# Patient Record
Sex: Male | Born: 1940 | Race: White | Hispanic: No | Marital: Married | Smoking: Former smoker
Health system: Southern US, Community
[De-identification: ages and names within clinical notes are randomized; demographics above are authoritative.]

## PROBLEM LIST (undated history)

## (undated) DIAGNOSIS — N529 Male erectile dysfunction, unspecified: Secondary | ICD-10-CM

## (undated) DIAGNOSIS — K2270 Barrett's esophagus without dysplasia: Secondary | ICD-10-CM

## (undated) DIAGNOSIS — N189 Chronic kidney disease, unspecified: Secondary | ICD-10-CM

## (undated) DIAGNOSIS — D649 Anemia, unspecified: Secondary | ICD-10-CM

## (undated) DIAGNOSIS — I2510 Atherosclerotic heart disease of native coronary artery without angina pectoris: Secondary | ICD-10-CM

## (undated) DIAGNOSIS — I10 Essential (primary) hypertension: Secondary | ICD-10-CM

## (undated) DIAGNOSIS — E785 Hyperlipidemia, unspecified: Secondary | ICD-10-CM

## (undated) DIAGNOSIS — C801 Malignant (primary) neoplasm, unspecified: Secondary | ICD-10-CM

## (undated) DIAGNOSIS — Z5189 Encounter for other specified aftercare: Secondary | ICD-10-CM

## (undated) DIAGNOSIS — Z860101 Personal history of adenomatous and serrated colon polyps: Secondary | ICD-10-CM

## (undated) DIAGNOSIS — K219 Gastro-esophageal reflux disease without esophagitis: Secondary | ICD-10-CM

## (undated) DIAGNOSIS — K5790 Diverticulosis of intestine, part unspecified, without perforation or abscess without bleeding: Secondary | ICD-10-CM

## (undated) DIAGNOSIS — M1990 Unspecified osteoarthritis, unspecified site: Secondary | ICD-10-CM

## (undated) DIAGNOSIS — I219 Acute myocardial infarction, unspecified: Secondary | ICD-10-CM

## (undated) HISTORY — DX: Diverticulosis of intestine, part unspecified, without perforation or abscess without bleeding: K57.90

## (undated) HISTORY — PX: COLONOSCOPY: SHX174

## (undated) HISTORY — PX: OTHER SURGICAL HISTORY: SHX169

## (undated) HISTORY — DX: Personal history of adenomatous and serrated colon polyps: Z86.0101

## (undated) HISTORY — DX: Atherosclerotic heart disease of native coronary artery without angina pectoris: I25.10

## (undated) HISTORY — DX: Chronic kidney disease, unspecified: N18.9

## (undated) HISTORY — PX: UPPER GASTROINTESTINAL ENDOSCOPY: SHX188

## (undated) HISTORY — DX: Essential (primary) hypertension: I10

## (undated) HISTORY — DX: Anemia, unspecified: D64.9

## (undated) HISTORY — DX: Unspecified osteoarthritis, unspecified site: M19.90

## (undated) HISTORY — DX: Malignant (primary) neoplasm, unspecified: C80.1

## (undated) HISTORY — DX: Male erectile dysfunction, unspecified: N52.9

## (undated) HISTORY — DX: Acute myocardial infarction, unspecified: I21.9

## (undated) HISTORY — DX: Gastro-esophageal reflux disease without esophagitis: K21.9

## (undated) HISTORY — DX: Barrett's esophagus without dysplasia: K22.70

## (undated) HISTORY — PX: CARDIAC CATHETERIZATION: SHX172

## (undated) HISTORY — DX: Encounter for other specified aftercare: Z51.89

## (undated) HISTORY — DX: Hyperlipidemia, unspecified: E78.5

## (undated) HISTORY — PX: SPINAL FUSION: SHX223

## (undated) HISTORY — PX: SPINE SURGERY: SHX786

## (undated) HISTORY — PX: ELBOW ARTHROSCOPY: SUR87

---

## 2000-11-07 ENCOUNTER — Inpatient Hospital Stay (HOSPITAL_COMMUNITY): Admission: EM | Admit: 2000-11-07 | Discharge: 2000-11-10 | Payer: Self-pay | Admitting: Emergency Medicine

## 2000-11-07 ENCOUNTER — Encounter: Payer: Self-pay | Admitting: Internal Medicine

## 2000-11-17 ENCOUNTER — Encounter (HOSPITAL_COMMUNITY): Admission: RE | Admit: 2000-11-17 | Discharge: 2001-02-15 | Payer: Self-pay | Admitting: Cardiology

## 2001-02-16 ENCOUNTER — Encounter (HOSPITAL_COMMUNITY): Admission: RE | Admit: 2001-02-16 | Discharge: 2001-03-16 | Payer: Self-pay | Admitting: Cardiology

## 2002-12-27 DIAGNOSIS — C4492 Squamous cell carcinoma of skin, unspecified: Secondary | ICD-10-CM

## 2002-12-27 HISTORY — DX: Squamous cell carcinoma of skin, unspecified: C44.92

## 2003-07-28 ENCOUNTER — Encounter: Payer: Self-pay | Admitting: Gastroenterology

## 2004-06-25 ENCOUNTER — Ambulatory Visit: Payer: Self-pay | Admitting: Family Medicine

## 2004-07-19 ENCOUNTER — Ambulatory Visit (HOSPITAL_COMMUNITY): Admission: RE | Admit: 2004-07-19 | Discharge: 2004-07-20 | Payer: Self-pay | Admitting: Neurological Surgery

## 2005-03-10 ENCOUNTER — Ambulatory Visit: Payer: Self-pay | Admitting: Family Medicine

## 2005-03-14 ENCOUNTER — Encounter: Admission: RE | Admit: 2005-03-14 | Discharge: 2005-03-14 | Payer: Self-pay | Admitting: Family Medicine

## 2005-05-06 ENCOUNTER — Ambulatory Visit: Payer: Self-pay | Admitting: Cardiology

## 2005-06-13 ENCOUNTER — Ambulatory Visit: Payer: Self-pay | Admitting: Family Medicine

## 2005-06-16 ENCOUNTER — Ambulatory Visit: Payer: Self-pay | Admitting: Family Medicine

## 2005-06-26 ENCOUNTER — Ambulatory Visit: Payer: Self-pay | Admitting: Family Medicine

## 2005-07-07 ENCOUNTER — Ambulatory Visit: Payer: Self-pay | Admitting: Family Medicine

## 2005-07-09 ENCOUNTER — Ambulatory Visit: Payer: Self-pay | Admitting: Cardiology

## 2005-07-11 ENCOUNTER — Ambulatory Visit: Payer: Self-pay | Admitting: Cardiology

## 2005-07-11 ENCOUNTER — Ambulatory Visit (HOSPITAL_COMMUNITY): Admission: RE | Admit: 2005-07-11 | Discharge: 2005-07-12 | Payer: Self-pay | Admitting: Internal Medicine

## 2005-07-21 ENCOUNTER — Ambulatory Visit: Payer: Self-pay

## 2005-07-28 ENCOUNTER — Ambulatory Visit: Payer: Self-pay | Admitting: Cardiology

## 2005-08-29 ENCOUNTER — Ambulatory Visit: Payer: Self-pay | Admitting: Family Medicine

## 2005-10-28 ENCOUNTER — Ambulatory Visit: Payer: Self-pay | Admitting: Cardiology

## 2006-04-30 ENCOUNTER — Ambulatory Visit: Payer: Self-pay | Admitting: Cardiology

## 2006-05-08 ENCOUNTER — Ambulatory Visit: Payer: Self-pay | Admitting: Internal Medicine

## 2006-05-08 ENCOUNTER — Ambulatory Visit: Payer: Self-pay | Admitting: Cardiology

## 2006-05-08 ENCOUNTER — Inpatient Hospital Stay (HOSPITAL_COMMUNITY): Admission: AD | Admit: 2006-05-08 | Discharge: 2006-05-11 | Payer: Self-pay | Admitting: Internal Medicine

## 2006-05-10 ENCOUNTER — Encounter: Payer: Self-pay | Admitting: Gastroenterology

## 2006-05-10 ENCOUNTER — Encounter (INDEPENDENT_AMBULATORY_CARE_PROVIDER_SITE_OTHER): Payer: Self-pay | Admitting: *Deleted

## 2006-05-11 ENCOUNTER — Encounter (INDEPENDENT_AMBULATORY_CARE_PROVIDER_SITE_OTHER): Payer: Self-pay | Admitting: *Deleted

## 2006-05-13 ENCOUNTER — Ambulatory Visit: Payer: Self-pay | Admitting: Internal Medicine

## 2006-05-14 ENCOUNTER — Ambulatory Visit: Payer: Self-pay | Admitting: Gastroenterology

## 2006-05-21 ENCOUNTER — Ambulatory Visit: Payer: Self-pay | Admitting: Family Medicine

## 2006-06-04 ENCOUNTER — Ambulatory Visit: Payer: Self-pay | Admitting: Gastroenterology

## 2006-08-28 ENCOUNTER — Ambulatory Visit: Payer: Self-pay | Admitting: Family Medicine

## 2006-08-28 LAB — CONVERTED CEMR LAB
ALT: 27 units/L (ref 0–40)
AST: 29 units/L (ref 0–37)
Albumin: 3.7 g/dL (ref 3.5–5.2)
BUN: 18 mg/dL (ref 6–23)
Basophils Relative: 0.4 % (ref 0.0–1.0)
Eosinophil percent: 2 % (ref 0.0–5.0)
GFR calc non Af Amer: 59 mL/min
HCT: 44.3 % (ref 39.0–52.0)
Hemoglobin: 15 g/dL (ref 13.0–17.0)
Neutro Abs: 4.1 10*3/uL (ref 1.4–7.7)
Neutrophils Relative %: 54.3 % (ref 43.0–77.0)
PSA: 0.82 ng/mL (ref 0.10–4.00)
Potassium: 4.2 meq/L (ref 3.5–5.1)
RBC: 5.06 M/uL (ref 4.22–5.81)
RDW: 18 % — ABNORMAL HIGH (ref 11.5–14.6)
Sodium: 139 meq/L (ref 135–145)
Total Bilirubin: 0.8 mg/dL (ref 0.3–1.2)
Total Protein: 7.2 g/dL (ref 6.0–8.3)
Triglyceride fasting, serum: 161 mg/dL — ABNORMAL HIGH (ref 0–149)
VLDL: 32 mg/dL (ref 0–40)
WBC: 7.7 10*3/uL (ref 4.5–10.5)

## 2006-09-07 ENCOUNTER — Ambulatory Visit: Payer: Self-pay | Admitting: Family Medicine

## 2006-10-22 ENCOUNTER — Ambulatory Visit: Payer: Self-pay | Admitting: Gastroenterology

## 2006-10-26 ENCOUNTER — Ambulatory Visit: Payer: Self-pay | Admitting: Cardiology

## 2006-11-25 ENCOUNTER — Encounter (INDEPENDENT_AMBULATORY_CARE_PROVIDER_SITE_OTHER): Payer: Self-pay | Admitting: *Deleted

## 2006-11-25 ENCOUNTER — Ambulatory Visit: Payer: Self-pay | Admitting: Gastroenterology

## 2007-01-07 ENCOUNTER — Encounter: Admission: RE | Admit: 2007-01-07 | Discharge: 2007-01-07 | Payer: Self-pay | Admitting: Family Medicine

## 2007-03-09 DIAGNOSIS — Z8719 Personal history of other diseases of the digestive system: Secondary | ICD-10-CM

## 2007-03-09 DIAGNOSIS — F528 Other sexual dysfunction not due to a substance or known physiological condition: Secondary | ICD-10-CM | POA: Insufficient documentation

## 2007-03-09 DIAGNOSIS — I10 Essential (primary) hypertension: Secondary | ICD-10-CM | POA: Insufficient documentation

## 2007-05-13 ENCOUNTER — Ambulatory Visit: Payer: Self-pay | Admitting: Cardiology

## 2007-05-13 LAB — CONVERTED CEMR LAB
AST: 25 units/L (ref 0–37)
Albumin: 3.9 g/dL (ref 3.5–5.2)
Cholesterol: 138 mg/dL (ref 0–200)
HDL: 38.2 mg/dL — ABNORMAL LOW (ref >39.0)
Total Bilirubin: 0.8 mg/dL (ref 0.3–1.2)
Total Protein: 7.3 g/dL (ref 6.0–8.3)
Triglycerides: 130 mg/dL (ref 0–149)

## 2007-07-09 IMAGING — CR DG CHEST 2V
2 series · 2 of 2 positions shown · non-contrast
Comparison: 07/19/04.

CLINICAL DATA: Hypertension.  CAD. 
 CHEST ? 2 VIEW:

[view not recorded (1 of 2)]
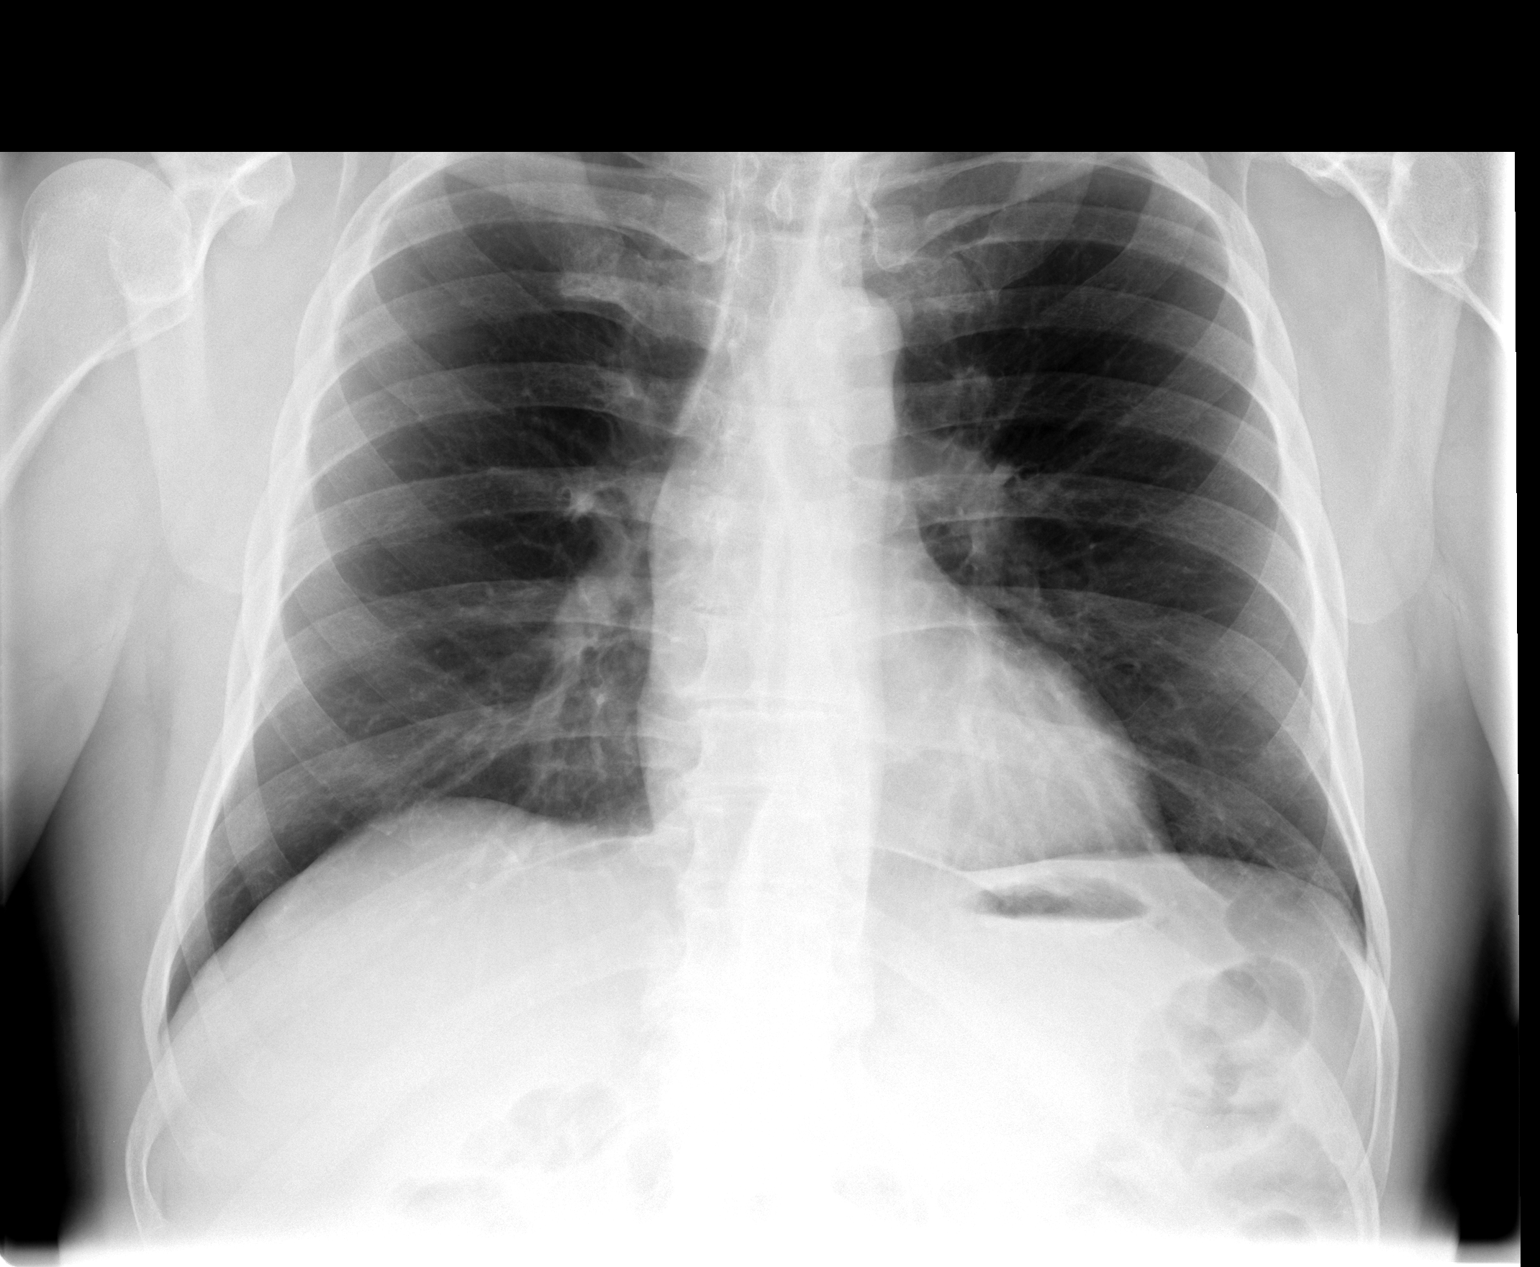

[view not recorded (2 of 2)]
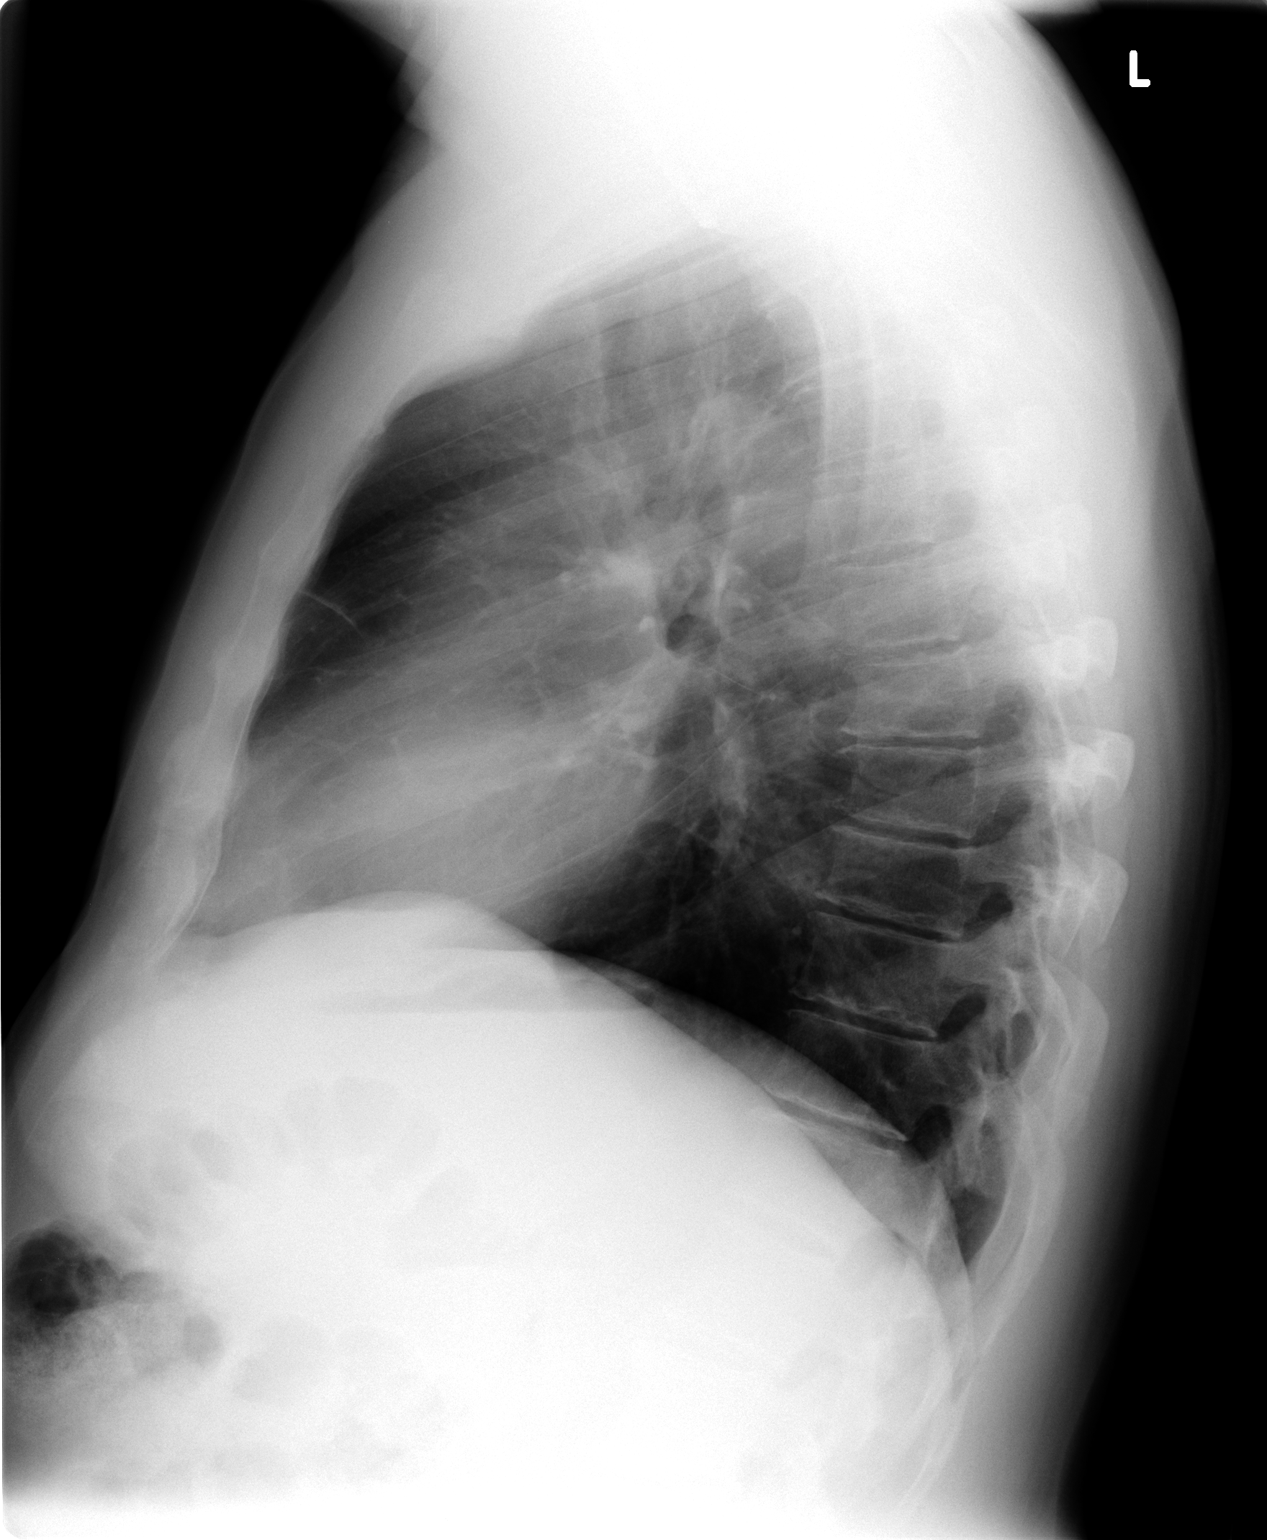

[2 of 2 positions shown; findings below may reference images not displayed]

FINDINGS: The heart size and mediastinal contours are within normal limits.  Both lungs are clear.  The visualized skeletal structures are unremarkable.
IMPRESSION: No active cardiopulmonary disease.

## 2007-09-21 ENCOUNTER — Ambulatory Visit: Payer: Self-pay | Admitting: Family Medicine

## 2007-09-21 DIAGNOSIS — I259 Chronic ischemic heart disease, unspecified: Secondary | ICD-10-CM

## 2007-09-21 DIAGNOSIS — Z87448 Personal history of other diseases of urinary system: Secondary | ICD-10-CM | POA: Insufficient documentation

## 2007-09-21 DIAGNOSIS — E7800 Pure hypercholesterolemia, unspecified: Secondary | ICD-10-CM | POA: Insufficient documentation

## 2007-09-21 DIAGNOSIS — E785 Hyperlipidemia, unspecified: Secondary | ICD-10-CM

## 2007-09-21 LAB — CONVERTED CEMR LAB
Basophils Relative: 0.4 % (ref 0.0–1.0)
Bilirubin, Direct: 0.2 mg/dL (ref 0.0–0.3)
CO2: 32 meq/L (ref 19–32)
Cholesterol: 166 mg/dL (ref 0–200)
Creatinine, Ser: 1.2 mg/dL (ref 0.4–1.5)
HCT: 44.6 % (ref 39.0–52.0)
Hemoglobin: 14.7 g/dL (ref 13.0–17.0)
LDL Cholesterol: 102 mg/dL — ABNORMAL HIGH (ref 0–99)
Lymphocytes Relative: 38.4 % (ref 12.0–46.0)
MCHC: 33 g/dL (ref 30.0–36.0)
Monocytes Absolute: 1.2 10*3/uL — ABNORMAL HIGH (ref 0.2–0.7)
Neutrophils Relative %: 42.9 % — ABNORMAL LOW (ref 43.0–77.0)
PSA: 1.1 ng/mL (ref 0.10–4.00)
Potassium: 4.9 meq/L (ref 3.5–5.1)
RDW: 12.1 % (ref 11.5–14.6)
Sodium: 139 meq/L (ref 135–145)
TSH: 0.52 microintl units/mL (ref 0.35–5.50)
Total Bilirubin: 0.8 mg/dL (ref 0.3–1.2)
Total Protein: 7.1 g/dL (ref 6.0–8.3)
VLDL: 22 mg/dL (ref 0–40)

## 2007-10-06 ENCOUNTER — Telehealth: Payer: Self-pay | Admitting: Family Medicine

## 2007-11-18 ENCOUNTER — Ambulatory Visit: Payer: Self-pay | Admitting: Gastroenterology

## 2007-12-08 ENCOUNTER — Ambulatory Visit: Payer: Self-pay | Admitting: Gastroenterology

## 2007-12-08 ENCOUNTER — Encounter: Payer: Self-pay | Admitting: Gastroenterology

## 2007-12-20 ENCOUNTER — Encounter: Payer: Self-pay | Admitting: Gastroenterology

## 2008-04-10 ENCOUNTER — Ambulatory Visit: Payer: Self-pay | Admitting: Family Medicine

## 2008-04-10 DIAGNOSIS — R300 Dysuria: Secondary | ICD-10-CM | POA: Insufficient documentation

## 2008-04-10 LAB — CONVERTED CEMR LAB
Bilirubin Urine: NEGATIVE
Urobilinogen, UA: 0.2
pH: 6

## 2008-06-08 ENCOUNTER — Ambulatory Visit: Payer: Self-pay | Admitting: Family Medicine

## 2008-09-15 ENCOUNTER — Ambulatory Visit: Payer: Self-pay | Admitting: Cardiology

## 2008-09-22 ENCOUNTER — Ambulatory Visit: Payer: Self-pay | Admitting: Family Medicine

## 2008-09-22 LAB — CONVERTED CEMR LAB
Ketones, urine, test strip: NEGATIVE
Nitrite: NEGATIVE
Urobilinogen, UA: 0.2

## 2008-09-25 LAB — CONVERTED CEMR LAB
ALT: 24 units/L (ref 0–53)
AST: 30 units/L (ref 0–37)
Alkaline Phosphatase: 59 units/L (ref 39–117)
Bilirubin, Direct: 0.1 mg/dL (ref 0.0–0.3)
CO2: 32 meq/L (ref 19–32)
Chloride: 102 meq/L (ref 96–112)
Glucose, Bld: 94 mg/dL (ref 70–99)
Hemoglobin: 14.4 g/dL (ref 13.0–17.0)
LDL Cholesterol: 80 mg/dL (ref 0–99)
Lymphocytes Relative: 32 % (ref 12.0–46.0)
Monocytes Relative: 9.7 % (ref 3.0–12.0)
Neutro Abs: 4.3 10*3/uL (ref 1.4–7.7)
Potassium: 4 meq/L (ref 3.5–5.1)
RDW: 12.7 % (ref 11.5–14.6)
Sodium: 143 meq/L (ref 135–145)
Total CHOL/HDL Ratio: 3.4
Total Protein: 7.2 g/dL (ref 6.0–8.3)
Triglycerides: 129 mg/dL (ref 0–149)

## 2008-10-20 ENCOUNTER — Telehealth: Payer: Self-pay | Admitting: Family Medicine

## 2008-10-20 ENCOUNTER — Ambulatory Visit: Payer: Self-pay | Admitting: Family Medicine

## 2008-10-27 ENCOUNTER — Telehealth: Payer: Self-pay | Admitting: Family Medicine

## 2009-06-21 ENCOUNTER — Encounter (INDEPENDENT_AMBULATORY_CARE_PROVIDER_SITE_OTHER): Payer: Self-pay | Admitting: *Deleted

## 2009-07-04 ENCOUNTER — Ambulatory Visit: Payer: Self-pay | Admitting: Family Medicine

## 2009-10-01 ENCOUNTER — Ambulatory Visit: Payer: Self-pay | Admitting: Family Medicine

## 2009-10-01 DIAGNOSIS — M1710 Unilateral primary osteoarthritis, unspecified knee: Secondary | ICD-10-CM

## 2009-10-01 DIAGNOSIS — IMO0002 Reserved for concepts with insufficient information to code with codable children: Secondary | ICD-10-CM | POA: Insufficient documentation

## 2009-10-01 LAB — CONVERTED CEMR LAB
AST: 24 units/L (ref 0–37)
Albumin: 3.9 g/dL (ref 3.5–5.2)
Alkaline Phosphatase: 58 units/L (ref 39–117)
Basophils Absolute: 0 10*3/uL (ref 0.0–0.1)
Bilirubin, Direct: 0.1 mg/dL (ref 0.0–0.3)
Calcium: 9.2 mg/dL (ref 8.4–10.5)
Creatinine, Ser: 1.2 mg/dL (ref 0.4–1.5)
Eosinophils Absolute: 0.1 10*3/uL (ref 0.0–0.7)
GFR calc non Af Amer: 63.42 mL/min (ref >60)
Glucose, Bld: 94 mg/dL (ref 70–99)
HDL: 51 mg/dL (ref >39.00)
Hemoglobin: 13.9 g/dL (ref 13.0–17.0)
Lymphocytes Relative: 33.3 % (ref 12.0–46.0)
Monocytes Relative: 9.4 % (ref 3.0–12.0)
Neutro Abs: 3.7 10*3/uL (ref 1.4–7.7)
Neutrophils Relative %: 54.8 % (ref 43.0–77.0)
Nitrite: NEGATIVE
Protein, U semiquant: NEGATIVE
RBC: 4.37 M/uL (ref 4.22–5.81)
RDW: 12.4 % (ref 11.5–14.6)
Sodium: 145 meq/L (ref 135–145)
Total CHOL/HDL Ratio: 3
Triglycerides: 103 mg/dL (ref 0.0–149.0)
VLDL: 20.6 mg/dL (ref 0.0–40.0)
WBC Urine, dipstick: NEGATIVE

## 2009-10-15 ENCOUNTER — Ambulatory Visit: Payer: Self-pay | Admitting: Cardiology

## 2009-10-16 ENCOUNTER — Telehealth: Payer: Self-pay | Admitting: Family Medicine

## 2009-11-02 ENCOUNTER — Telehealth: Payer: Self-pay | Admitting: Cardiology

## 2009-12-03 ENCOUNTER — Encounter (INDEPENDENT_AMBULATORY_CARE_PROVIDER_SITE_OTHER): Payer: Self-pay | Admitting: *Deleted

## 2009-12-20 ENCOUNTER — Ambulatory Visit: Payer: Self-pay | Admitting: Family Medicine

## 2009-12-20 DIAGNOSIS — J020 Streptococcal pharyngitis: Secondary | ICD-10-CM | POA: Insufficient documentation

## 2009-12-20 DIAGNOSIS — J029 Acute pharyngitis, unspecified: Secondary | ICD-10-CM

## 2009-12-20 LAB — CONVERTED CEMR LAB: Rapid Strep: POSITIVE

## 2009-12-26 ENCOUNTER — Ambulatory Visit: Payer: Self-pay | Admitting: Family Medicine

## 2009-12-26 LAB — CONVERTED CEMR LAB
Bilirubin Urine: NEGATIVE
Glucose, Urine, Semiquant: NEGATIVE
Ketones, urine, test strip: NEGATIVE
Specific Gravity, Urine: 1.015
WBC Urine, dipstick: NEGATIVE
pH: 6

## 2010-01-24 ENCOUNTER — Ambulatory Visit: Payer: Self-pay | Admitting: Gastroenterology

## 2010-01-24 DIAGNOSIS — I2510 Atherosclerotic heart disease of native coronary artery without angina pectoris: Secondary | ICD-10-CM | POA: Insufficient documentation

## 2010-01-24 DIAGNOSIS — K2270 Barrett's esophagus without dysplasia: Secondary | ICD-10-CM | POA: Insufficient documentation

## 2010-02-13 ENCOUNTER — Ambulatory Visit: Payer: Self-pay | Admitting: Gastroenterology

## 2010-02-18 ENCOUNTER — Encounter: Payer: Self-pay | Admitting: Gastroenterology

## 2010-06-24 ENCOUNTER — Ambulatory Visit: Payer: Self-pay | Admitting: Family Medicine

## 2010-08-08 ENCOUNTER — Telehealth: Payer: Self-pay | Admitting: Family Medicine

## 2010-09-24 NOTE — Letter (Signed)
Summary: Patient Notice-Barrett's [facility]  [address] [place], [place] [address]   Phone: [phone]  Fax: [phone]        February 18, 2010 MRN: [id number]    [name] [address] [place], [place]  [address]    Dear [name],  I am pleased to inform you that the biopsies taken during your recent endoscopic examination did not show any evidence of cancer upon pathologic examination.  However, your biopsies indicate you have a condition known as Barrett's esophagus. While not cancer, it is pre-cancerous (can progress to cancer) and needs to be monitored with repeat endoscopic examination and biopsies.  Fortunately, it is quite rare that this develops into cancer, but careful monitoring of the condition along with taking your medication as prescribed is important in reducing the risk of developing cancer.  It is my recommendation that you have a repeat upper gastrointestinal endoscopic examination in 3 years.  Continue with treatment plan as outlined the day of your exam.  Please call us if you have or develop heartburn, reflux symptoms, any swallowing problems, or if you have questions about your condition that have not been fully answered at this time.  Sincerely,  Meryl Dare MD [facility]  This letter has been electronically signed by your physician.  Appended Document: Patient Notice-Barrett's Esopghagus letter mailed 7.6.11

## 2010-09-24 NOTE — Assessment & Plan Note (Signed)
Summary: 70 YR ROV 414.01  PFH   Visit Type:  Follow-up Primary Provider:  Roderick Pee MD  CC:  CAD.  History of Present Illness: The patient presents for followup of his known coronary disease. Over the past year he has done quite well. He has remained active exercising routinely 3 times a week despite having some arthritis problems. With this he denies chest pressure, neck or arm discomfort. He has had no palpitations, presyncope or syncope. He has had no shortness of breath, PND or orthopnea. Weights have been stable.  Current Medications (verified): 1)  Hydrochlorothiazide 25 Mg  Tabs (Hydrochlorothiazide) .... Take 1 Tablet By Mouth Every Morning 2)  Atenolol 50 Mg  Tabs (Atenolol) .... Take 1 Tablet By Mouth Two Times A Day 3)  Cardizem Cd 180 Mg  Cp24 (Diltiazem Hcl Coated Beads) .... Take 1 Tablet By Mouth Every Morning 4)  Plavix 75 Mg  Tabs (Clopidogrel Bisulfate) .... Take 1 Tablet By Mouth Every Morning 5)  Zocor 40 Mg  Tabs (Simvastatin) .... Take 1 Tablet By Mouth Every Morning 6)  Nitrostat 0.4 Mg  Subl (Nitroglycerin) .... One Sl Prn 7)  Viagra 100 Mg  Tabs (Sildenafil Citrate) .... Uad 8)  Ramipril 10 Mg Caps (Ramipril) .... Take One Tab By Mouth Daily 9)  Klor-Con M20 20 Meq Cr-Tabs (Potassium Chloride Crys Cr) .... Take One Tab in The Morning 10)  Centrum Silver  Tabs (Multiple Vitamins-Minerals) .... Once Daily 11)  Omega-3 Krill Oil 300 Mg Caps (Krill Oil) .... One Tab Two Times A Day 12)  Ecotrin Low Strength 81 Mg Tbec (Aspirin) .... Once Daily 13)  Protonix 40 Mg Tbec (Pantoprazole Sodium) .... Once Daily 14)  Osteo Bi-Flex Adv Joint Shield  Tabs (Misc Natural Products) .... Once Daily  Allergies (verified): No Known Drug Allergies  Past History:  Past Medical History: Hypertension Gastrointestinal hemorrhage, hx of Hyperlipidemia Erectile dysfunction Asymptomatic hematuria Coronary artery disease (March 2002 stenting of the right coronary artery;  November 2006, 90% lesion of the first diagonal, 90% stenosis of the left circumflex.  He had Taxus stenting of the diagonal and PTCA of the circumflex.  He had 50-60% residual stenosis of the LAD) GI bleed, secondary to indomethacin  Past Surgical History: Spinal fusion Colonoscopy-07/28/2003  Review of Systems       As stated in the HPI and negative for all other systems.   Vital Signs:  Patient profile:   70 year old male Height:      65.5 inches Weight:      177 pounds BMI:     29.11 Pulse rate:   58 / minute Resp:     16 per minute BP sitting:   120 / 64  (right arm)  Vitals Entered By: Marrion Coy, CNA (October 15, 2009 9:18 AM)  Physical Exam  General:  Well developed, well nourished, in no acute distress. Head:  normocephalic and atraumatic Eyes:  PERRLA/EOM intact; conjunctiva and lids normal. Mouth:  Teeth, gums and palate normal. Oral mucosa normal. Neck:  Neck supple, no JVD. No masses, thyromegaly or abnormal cervical nodes. Chest Wall:  no deformities or breast masses noted Lungs:  Clear bilaterally to auscultation and percussion. Abdomen:  Bowel sounds positive; abdomen soft and non-tender without masses, organomegaly, or hernias noted. No hepatosplenomegaly. Msk:  Back normal, normal gait. Muscle strength and tone normal. Extremities:  No clubbing or cyanosis. Skin:  Intact without lesions or rashes. Cervical Nodes:  no significant adenopathy Axillary Nodes:  no significant adenopathy Inguinal Nodes:  no significant adenopathy Psych:  Normal affect.   Detailed Cardiovascular Exam  Neck    Carotids: Carotids full and equal bilaterally without bruits.      Neck Veins: Normal, no JVD.    Heart    Inspection: no deformities or lifts noted.      Palpation: normal PMI with no thrills palpable.      Auscultation: regular rate and rhythm, S1, S2 without murmurs, rubs, gallops, or clicks.    Vascular    Abdominal Aorta: no palpable masses, pulsations,  or audible bruits.      Femoral Pulses: normal femoral pulses bilaterally.      Pedal Pulses: normal pedal pulses bilaterally.      Radial Pulses: normal radial pulses bilaterally.      Peripheral Circulation: no clubbing, cyanosis, or edema noted with normal capillary refill.     EKG  Procedure date:  10/15/2009  Findings:      sinus rhythm, rate 58, axis within normal limits, intervals within normal limits, no acute ST-T wave changes.  Impression & Recommendations:  Problem # 1:  CORONARY ARTERY DISEASE, S/P PTCA (ICD-414.9) The patient is doing well. He has had no new symptoms. He remains active. No further cardiovascular testing is indicated at this point. Orders: EKG w/ Interpretation (93000)  Problem # 2:  HYPERLIPIDEMIA (ICD-272.4) I reviewed his lipids drawn earlier this year. HDL was 51, LDL 73. He is at target and will remain on the meds as listed.  Problem # 3:  HYPERTENSION (ICD-401.9) Blood pressure is controlled. He will remain on the meds as listed.  Patient Instructions: 1)  Your physician recommends that you schedule a follow-up appointment in: 1 YEAR 2)  Your physician recommends that you continue on your current medications as directed. Please refer to the Current Medication list given to you today.

## 2010-09-24 NOTE — Letter (Signed)
Summary: EGD Instructions  [facility]  [address] [place], [place] [address]   Phone: [phone]  Fax: [phone]       [name]    28-Jan-1941    MRN: [id number]       Procedure Day Dorna Bloom: Wednesday June 22nd, 2011     Arrival Time:  1:30pm     Procedure Time: 2:30pm     Location of Procedure:                    _ x _ [facility] (4th Floor)    PREPARATION FOR ENDOSCOPY   On 02/13/10  THE DAY OF THE PROCEDURE:  1.   No solid foods, milk or milk products are allowed after midnight the night before your procedure.  2.   Do not drink anything colored red or purple.  Avoid juices with pulp.  No orange juice.  3.  You may drink clear liquids until 12:30pm, which is 2 hours before your procedure.                                                                                                CLEAR LIQUIDS INCLUDE: Water Jello Ice Popsicles Tea (sugar ok, no milk/cream) Powdered fruit flavored drinks Coffee (sugar ok, no milk/cream) Gatorade Juice: apple, white grape, white cranberry  Lemonade Clear bullion, consomm, broth Carbonated beverages (any kind) Strained chicken noodle soup Hard Candy   MEDICATION INSTRUCTIONS  Unless otherwise instructed, you should take regular prescription medications with a small sip of water as early as possible the morning of your procedure.  Stop taking Plavix or Aggrenox on  _  (5 days before procedure).     Additional medication instructions: You will be contacted by our office prior to your procedure for directions on holding your Plavix.  If you do not hear from our office 1 week prior to your scheduled procedure, please call [phone] to discuss.             OTHER INSTRUCTIONS  You will need a responsible adult at least 72 years of age to accompany you and drive you home.   This person must remain in the waiting room during your procedure.  Wear loose fitting clothing that is easily  removed.  Leave jewelry and other valuables at home.  However, you may wish to bring a book to read or an iPod/MP3 player to listen to music as you wait for your procedure to start.  Remove all body piercing jewelry and leave at home.  Total time from sign-in until discharge is approximately 2-3 hours.  You should go home directly after your procedure and rest.  You can resume normal activities the day after your procedure.  The day of your procedure you should not:   Drive   Make legal decisions   Operate machinery   Drink alcohol   Return to work  You will receive specific instructions about eating, activities and medications before you leave.    The above instructions have been reviewed and explained to me by   Marchelle Folks.  I fully understand and can verbalize these instructions _____________________________ Date _________

## 2010-09-24 NOTE — Assessment & Plan Note (Signed)
Summary: recall egd pt on plavix...em   History of Present Illness Visit Type: Follow-up Visit Primary GI MD: Elie Goody MD [facility] Primary Provider: Roderick Pee MD Chief Complaint: Patient here for recall endoscopy for barretts states that he is having no problems, but is on Plavix.  History of Present Illness:   This is a 70 year old male with a history of Barrett's esophagus. His most recent endoscopy was in April 2009 with a 6 cm length of Barretts with focal, slight atypia noted. He states he changed from Protonix to OTC Prevacid, on the advice of his pharmacist. His reflux symptoms are very good control. He takes Plavix and aspirin chronically with a history of coronary artery disease, and a prior Taxus stent placed in 2006.   GI Review of Systems      Denies abdominal pain, acid reflux, belching, bloating, chest pain, dysphagia with liquids, dysphagia with solids, heartburn, loss of appetite, nausea, vomiting, vomiting blood, weight loss, and  weight gain.        Denies anal fissure, black tarry stools, change in bowel habit, constipation, diarrhea, diverticulosis, fecal incontinence, heme positive stool, hemorrhoids, irritable bowel syndrome, jaundice, light color stool, liver problems, rectal bleeding, and  rectal pain.   Current Medications (verified): 1)  Hydrochlorothiazide 25 Mg  Tabs (Hydrochlorothiazide) .... Take 1 Tablet By Mouth Every Morning 2)  Atenolol 50 Mg  Tabs (Atenolol) .... Take 1 Tablet By Mouth Two Times A Day 3)  Cardizem Cd 180 Mg  Cp24 (Diltiazem Hcl Coated Beads) .... Take 1 Tablet By Mouth Every Morning 4)  Plavix 75 Mg  Tabs (Clopidogrel Bisulfate) .... Take 1 Tablet By Mouth Every Morning 5)  Zocor 40 Mg  Tabs (Simvastatin) .... Take 1 Tablet By Mouth Every Morning 6)  Nitrostat 0.4 Mg  Subl (Nitroglycerin) .... One Sl Prn 7)  Viagra 100 Mg  Tabs (Sildenafil Citrate) .... Uad 8)  Ramipril 10 Mg Caps (Ramipril) .... Take One Tab By Mouth Daily 9)   Klor-Con M20 20 Meq Cr-Tabs (Potassium Chloride Crys Cr) .... Take One Tab in The Morning 10)  Centrum Silver  Tabs (Multiple Vitamins-Minerals) .... Once Daily 11)  Omega-3 Krill Oil 300 Mg Caps (Krill Oil) .... One Tab Two Times A Day 12)  Ecotrin Low Strength 81 Mg Tbec (Aspirin) .... Once Daily 13)  Prevacid 24hr 15 Mg Cpdr (Lansoprazole) .... Take One By Mouth Once Daily  Allergies (verified): No Known Drug Allergies  Past History:  Past Medical History: Hypertension Hyperlipidemia Erectile dysfunction Asymptomatic hematuria Coronary artery disease (March 2002 stenting of the right coronary artery; November 2006, 90% lesion of the first diagonal, 90% stenosis of the left circumflex.  He had Taxus stenting of the diagonal and PTCA of the circumflex.  He had 50-60% residual stenosis of the LAD) Barretts Esophagus, 2007 Diverticulosis Hemorrhoids GERD Duodenal ulcer Degenerative joint disease  Past Surgical History: Spinal fusion Colonoscopy-07/28/2003 cervical spine surgery cardiac stents X 2  Family History: Family History of Alcoholism/Addiction: father Family History of CAD Male 1st degree relative <50: father Fam hx Alzhethers: mothers father had cancer unknown type No FH of Colon Cancer:  Social History: Former Smoker Retired Married Alcohol use-no Drug use-no Regular exercise-yes Daily Caffeine Use rare  Review of Systems       The patient complains of arthritis/joint pain, back pain, blood in urine, and muscle pains/cramps.         The pertinent positives and negatives are noted as above and in the  HPI. All other ROS were reviewed and were negative.   Vital Signs:  Patient profile:   70 year old male Height:      65.5 inches Weight:      176.6 pounds BMI:     29.05 Pulse rate:   60 / minute Pulse rhythm:   regular BP sitting:   126 / 88  (left arm) Cuff size:   regular  Vitals Entered By: Harlow Mares CMA Duncan Dull) (January 24, 2010 9:50  AM)  Physical Exam  General:  Well developed, well nourished, no acute distress. Head:  Normocephalic and atraumatic. Eyes:  PERRLA, no icterus. Ears:  Normal auditory acuity. Mouth:  No deformity or lesions, dentition normal. Neck:  Supple; no masses or thyromegaly. Lungs:  Clear throughout to auscultation. Heart:  Regular rate and rhythm; no murmurs, rubs,  or bruits. Abdomen:  Soft, nontender and nondistended. No masses, hepatosplenomegaly or hernias noted. Normal bowel sounds. Msk:  Symmetrical with no gross deformities. Normal posture. Pulses:  Normal pulses noted. Extremities:  No clubbing, cyanosis, edema or deformities noted. Neurologic:  Alert and  oriented x4;  grossly normal neurologically. Cervical Nodes:  No significant cervical adenopathy. Inguinal Nodes:  No significant inguinal adenopathy. Psych:  Alert and cooperative. Normal mood and affect.  Impression & Recommendations:  Problem # 1:  BARRETTS ESOPHAGUS (ICD-530.85) 6 cm length of Barrett esophagus with focal atypia on biopsies. Change back to full strength proton pump inhibitor. Begin Protonix 40 mg q.a.m. and discontinue OTC Prevacid. Continue on standard antireflux measures. The risks, benefits and alternatives to endoscopy with possible biopsy and possible dilation were discussed with the patient and they consent to proceed. The procedure will be scheduled electively. Orders: EGD (EGD)  Problem # 2:  CORONARY ARTERY DISEASE (ICD-414.00) Taxus stent placement in 2006. The risks, benefits, and alternatives to a 5 day hold Plavix while continuing aspirin, were discussed with the patient and he consents to proceed. Will obtain clearance for Dr. Antoine Poche.  Problem # 3:  SCREENING COLORECTAL-CANCER (ICD-V76.51) Average risk of colon cancer. Prior colonoscopy December 2004. Plan for 10 year interval for screening in December 2014.  Patient Instructions: 1)  Stop Prevacid and start Protonix. Rx was sent to pts  pharmacy.  2)  We will contact you about Plavix clearance.  3)  Upper Endoscopy brochure given.  4)  The medication list was reviewed and reconciled.  All changed / newly prescribed medications were explained.  A complete medication list was provided to the patient / caregiver.  Prescriptions: PROTONIX 40 MG TBEC (PANTOPRAZOLE SODIUM) one tablet by mouth once daily  #30 x 11   Entered by:   Christie Nottingham CMA (AAMA)   Authorized by:   Meryl Dare MD [facility]   Signed by:   Meryl Dare MD FACG on 01/24/2010   Method used:   Electronically to        [facility] # [phone]* (retail)       [address]       [place], [place]  [address]       Ph: [phone]       Fax: [phone]   RxID:   [id number]

## 2010-09-24 NOTE — Procedures (Signed)
Summary: EGD and biopsy   EGD  Procedure date:  05/10/2006  Findings:      Hiatal hernia Location: [facility]   Patient Name: [name], [name] MRN:  Procedure Procedures: Panendoscopy (EGD) CPT: 43235.    with multiple biopsies. CPT: [id number].  Personnel: Endoscopist: Rachael Fee, MD.  Exam Location: Exam performed in Endoscopy Suite.  Patient Consent: Procedure, Alternatives, Risks and Benefits discussed, consent obtained, from patient. Consent was obtained by the RN.  Indications Symptoms: Melena.  Comments: on ASA, plavix, NSAIDs (held now). History  Current Medications: Patient is not currently taking Coumadin.  Pre-Exam Physical: Performed May 10, 2006  Entire physical exam was normal. Cardio- pulmonary exam, Abdominal exam, Mental status exam WNL.  Comments: Pt. history reviewed/updated, physical exam performed prior to initiation of sedation? yes Exam Exam Info: Maximum depth of insertion Duodenum, intended Duodenum. Patient position: on left side. Vocal cords visualized. Gastric retroflexion performed. Images taken. ASA Classification: II. Tolerance: good.  Sedation Meds: Patient assessed and found to be appropriate for moderate (conscious) sedation. Fentanyl 62.5 mcg. given IV. Versed 5 mg. given IV.  Monitoring: BP and pulse monitoring done. Oximetry used. Supplemental O2 given  Fluoroscopy: Fluoroscopy was not used.  Findings - BARRETT'S ESOPHAGUS:  suspected. Proximal margin 26 cm from mouth,  distal margin 33 cm. Length of Barrett's 7 cm. Comment: No inflammtion or nodularity within salmon colored mucosa c/w Barrett's esophagus.  4 quadrant biopsies taken at 32cm, 29cm and 26cm to confirm Barrett's and check for underlying dysplasia.  HIATAL HERNIA: Diaphragm 39 cm from mouth. Z-line/GE Junction 34 cm from mouth. ICD9: Hernia, Hiatal: 553.3. - Normal: Antrum. Comments: CLO biopsies taken to check for H. pylori.  OTHER FINDING:  area of slight narrowing, friable mucosa.  No obvious ulcers and scope passed easily.  C/w site of previous peptic ulcer disease. in Duodenal Apex.   Assessment Abnormal examination, see findings above.  Diagnoses: 553.3: Hernia, Hiatal.   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  This is most likely Steven site of his recent melena.  He has long segment of Barrett's appearing mucosa and several biopsies were taken to check for dysplasia. Events  Unplanned Intervention: No unplanned interventions were required.  Unplanned Events: There were no complications. Plans Comments: If CLO testing is positive, H. pylori should be eradicated.  He has infrequent GERD symptoms, but will need to be on PPI daily (20 minutes prior to breakfast) to control further acid damage.  Should avoid NSAIDs as best as possible.  Would hold ASA/plavix for 7 more days and then restart. I will communicate Steven biopsy results with him directly and will arrange surveillance EGD as appropriate. Disposition: After procedure patient sent to recovery.    cc: Steven patient    SP Surgical Pathology - STATUS: Final             By: Darrick Penna MD , Margaretha Glassing      Perform Date: 65Sep07 00:01  Ordered By: Lisabeth Register,         Ordered Date: 17Sep07 10:25  Facility: [facility]                              Department: CPATH  Service Report Text  [name]   [address]   [place], [place] [address]   [phone]    REPORT OF SURGICAL PATHOLOGY    Case #: [id number]   Patient Name: [name], [name].   Office Chart Number: N/A    MRN: [id number]   Pathologist: Elisha Ponder, M.D.   DOB/Age Mar 26, 1941 (Age: 70) Gender: M   Date Taken: 05/10/2006   Date Received: 05/10/2006    FINAL DIAGNOSIS    ***MICROSCOPIC EXAMINATION AND DIAGNOSIS***    1. ESOPHAGUS: INTESTINAL METAPLASIA  CONSISTENT WITH BARRETT' S   ESOPHAGUS. GLANDULAR ATYPIA INDEFINITE FOR DYSPLASIA. SEE   COMMENT.    2. ESOPHAGUS: INTESTINAL METAPLASIA (GOBLET CELL METAPLASIA)   CONSISTENT WITH BARRETT' S ESOPHAGUS. NO DYSPLASIA OR MALIGNANCY   IDENTIFIED. REACTIVE CHANGES PRESENT.    3. ESOPHAGUS: INTESTINAL METAPLASIA (GOBLET CELL METAPLASIA)   CONSISTENT WITH BARRETT' S ESOPHAGUS. NO DYSPLASIA OR MALIGNANCY   IDENTIFIED. REACTIVE CHANGES PRESENT.    COMMENT   1. An Alcian Blue stain is performed to determine Steven presence of   intestinal metaplasia (goblet cell metaplasia). Steven Alcian Blue   stain shows intestinal metaplasia (goblet cell metaplasia). Steven   control stained appropriately.    2. An Alcian Blue stain is performed to determine Steven presence of   intestinal metaplasia (goblet cell metaplasia). Steven Alcian Blue   stain shows intestinal metaplasia (goblet cell metaplasia). Steven   control stained appropriately.    3. An Alcian Blue stain is performed to determine Steven presence of   intestinal metaplasia (goblet cell metaplasia). Steven Alcian Blue   stain shows intestinal metaplasia (goblet cell metaplasia). Steven   control stained appropriately.   (VF:mw 05/12/06)    mw   Date Reported: 05/12/2006 Elisha Ponder, M.D.   *** Electronically Signed Out By VJF ***    Clinical information   1-3 Check for dysplasia; GI bleed (as)    specimen(s) obtained   1: Esophagus, biopsy, Barrett's @ 32cm   2: Esophagus, biopsy, Barrett's @ 29cm   3: Esophagus, biopsy, Barrett's @ 26cm    Gross Description   1. Received in formalin are tan, soft tissue fragments that are   submitted in toto. Number: two.   Size: each 0.3 cm    2. Received in formalin are tan, soft tissue fragments that are   submitted in toto. Number: four.   Size: 0.2 cm    3. Received in formalin are tan, soft tissue fragments that are   submitted in toto. Number: two.   Size: 0.2 and 0.3 cm (JBM:mw 05/11/06)    mw/

## 2010-09-24 NOTE — Assessment & Plan Note (Signed)
Summary: FLU SHOT // RS  Nurse Visit   Review of Systems       Flu Vaccine Consent Questions     Do you have a history of severe allergic reactions to this vaccine? no    Any prior history of allergic reactions to egg and/or gelatin? no    Do you have a sensitivity to the preservative Thimersol? no    Do you have a past history of Guillan-Barre Syndrome? no    Do you currently have an acute febrile illness? no    Have you ever had a severe reaction to latex? no    Vaccine information given and explained to patient? yes    Are you currently pregnant? no    Lot Number:[id number]   Exp Date:02/22/2011   Site Given  Left Deltoid IM    Allergies: No Known Drug Allergies  Orders Added: 1)  Flu Vaccine 72yrs + MEDICARE PATIENTS [Q2039] 2)  Administration Flu vaccine - MCR [G0008]

## 2010-09-24 NOTE — Letter (Signed)
Summary: Anticoagulation Modification Letter  [facility]  [address] [place], [place] [address]   Phone: [phone]  Fax: [phone]    January 24, 2010  Re:    [name] DOB:    [birth year] MRN:    [id number]    Dear Dr. Antoine Poche,  We have scheduled the above patient for an endoscopic procedure. Our records show that  he/she is on anticoagulation therapy. Please advise as to how long the patient may come off their therapy of Plavix prior to the scheduled procedure(s) on 02/13/10.   Please fax back/or route the completed form to Marty at [phone].  Thank you for your help with this matter.  Sincerely,  Christie Nottingham CMA Duncan Dull)   Physician Recommendation:  Hold Plavix 7 days prior ________________  Hold Coumadin 5 days prior ____________  Other ______________________________     Appended Document: Anticoagulation Modification Letter OK to stop Plavix x 5 days prior to the procedure.  Appended Document: Anticoagulation Modification Letter Pt notifed and pt verbalized understanding.

## 2010-09-24 NOTE — Procedures (Signed)
Summary: EGD and biopsy   EGD  Procedure date:  11/25/2006  Findings:      Hiatal hernia, Barrett's, Duodenal stricture Location: [facility]    Procedures Next Due Date:    EGD: 11/2009  EGD  Procedure date:  11/25/2006  Findings:      Hiatal hernia, Barrett's, Duodenal stricture Location: [facility]    Procedures Next Due Date:    EGD: 11/2009 Patient Name: Steven, [name] MRN:  Procedure Procedures: Panendoscopy (EGD) CPT: 43235.    with biopsy(s)/brushing(s). CPT: D1846139.  Personnel: Endoscopist: Venita Lick. Russella Dar, MD, Clementeen Graham.  Exam Location: Exam performed in Outpatient Clinic. Outpatient  Patient Consent: Procedure, Alternatives, Risks and Benefits discussed, consent obtained, from patient. Consent was obtained by the RN.  Indications  Surveillance of: Barrett's Esophagus.  History  Current Medications: Patient is not currently taking Coumadin.  Pre-Exam Physical: Performed Nov 25, 2006  Cardio-pulmonary exam, HEENT exam, Abdominal exam, Mental status exam WNL.  Comments: Pt. history reviewed/updated, physical exam performed prior to initiation of sedation?Yes Exam Exam Info: Maximum depth of insertion Duodenum, intended Duodenum. Vocal cords not visualized. Gastric retroflexion performed. ASA Classification: II. Tolerance: excellent.  Sedation Meds: Patient assessed and found to be appropriate for moderate (conscious) sedation. Fentanyl 50 mcg. given IV. Versed 5 mg. given IV. Cetacaine Spray 2 sprays given aerosolized.  Monitoring: BP and pulse monitoring done. Oximetry used. Supplemental O2 given  Findings Normal: Proximal Esophagus to Mid Esophagus.  BARRETT'S ESOPHAGUS: Proximal margin 28 cm from mouth,  distal margin 34 cm. No inflammation present. Biopsy/Barrett's taken. ICD9: Barrett's: 530.2.  HIATAL HERNIA: Z-line/GE Junction 34 cm from mouth. Regular, 5 cms. in length. ICD9: Hernia, Hiatal: 553.3. Normal:  Fundus to Pyloric Sphincter.  STRICTURE / STENOSIS: Stricture in Duodenal Bulb.  Lumen diameter is 12 mm. 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 Comment: non obstructing.  Normal: Duodenal 2nd Portion.   Assessment  Diagnoses: 553.3: Hernia, Hiatal.  530.2: Barrett's.  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   Events  Unplanned Intervention: No unplanned interventions were required.  Unplanned Events: There were no complications. Plans Medication(s): Await pathology. PPI: BID, for indefinitely.   Patient Education: Patient given standard instructions for: Barrett's. Hiatal Hernia. Reflux.  Disposition: After procedure patient sent to recovery. After recovery patient sent home.  Scheduling: EGD, to [employer]. Russella Dar, MD, [facility], around Nov 24, 2009.  Office Visit, to [employer]. Russella Dar, MD, [facility], around Nov 25, 2007.    This report was created from the original endoscopy report, which was reviewed and signed by the above listed endoscopist.    cc: Tinnie Gens A. Tawanna Cooler, MD         SP Surgical Pathology - STATUS: Final             By: Osie Bond  ,      Perform Date: 2 Apr08 00:01  Ordered By: Rica Records Date: 2 Apr08 15:16  Facility: [facility]                               Department: CPATH  Service Report Text  [facility]   [address] [address]   [place], [place] [address]   Telephone [phone] or [phone] Fax [phone]    REPORT OF SURGICAL PATHOLOGY    Case #: [id number]   Patient Name: [name], [name].   Office Chart Number:  [id number]    MRN: [id number]   Pathologist: Renato Battles, M.D.   DOB/Age [birth year] (Age: 70) Gender: M   Date Taken: 11/25/2006   Date Received: 11/25/2006    FINAL DIAGNOSIS    ***MICROSCOPIC EXAMINATION AND DIAGNOSIS***    1. 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 NO DYSPLASIA   OR MALIGNANCY IDENTIFIED.    2. 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    3. 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 NO DYSPLASIA   OR MALIGNANCY IDENTIFIED.    COMMENT   1. An Alcian Blue stain is performed to determine the presence   of intestinal metaplasia (goblet cell metaplasia). The Alcian   Blue stain shows intestinal metaplasia (goblet cell metaplasia).   The control stained appropriately.    2. Evaluation of the esophageal biopsy in Part 2 demonstrates   intestinal metaplasia (goblet cell metaplasia), consistent with   Barrett' s esophagus. There is focal increased cytologic atypia   with nuclear enlargement and stratification, consistent with low   grade glandular dysplasia. No high grade dysplasia or invasive   carcinoma is identified. An Alcian Blue stain is performed to   determine the presence of intestinal metaplasia (goblet cell   metaplasia). The Alcian Blue stain shows intestinal metaplasia   (goblet cell metaplasia). The control stained appropriately.    3. An Alcian Blue stain is performed to determine the presence   of intestinal metaplasia (goblet cell metaplasia). The Alcian   Blue stain shows intestinal metaplasia (goblet cell metaplasia).   The control stained appropriately. (MS:caf 11/26/06)    cf   Date Reported: 11/26/2006 Renato Battles, M.D.   *** Electronically Signed Out By MS ***    Clinical information   History of Barrett' s. R/O Barrett' s. (mj)    specimen(s) obtained   1: Esophagus, biopsy   2: Esophagus, biopsy   3: Esophagus, biopsy    Gross Description   1. Received in formalin are tan, soft tissue fragments that are   submitted in toto. Number: 4   Size: 0.1 to 0.4 cm    2. Received in formalin are tan, soft tissue fragments that are   submitted in toto. Number: 3   Size: 0.1 to 0.2 cm     3. Received in formalin are tan, soft tissue fragments that are   submitted in toto. Number: 3   Size: 0.2 to 0.3 cm (TB:kv 11-25-06)    kv/

## 2010-09-24 NOTE — Assessment & Plan Note (Signed)
Summary: ?uti/cjr   Vital Signs:  Patient profile:   70 year old male Weight:      181 pounds Temp:     98.6 degrees F oral BP sitting:   130 / 90  (left arm) Cuff size:   regular  Vitals Entered By: Kern Reap CMA Duncan Dull) (Dec 26, 2009 8:54 AM) CC: uti   Primary Care Provider:  Roderick Pee MD  CC:  uti.  History of Present Illness: [name]  is a 70 year old, married male, nonsmoker, who comes in todaywith a one day history of frequency, dysuria.  He said prostatitis x 1 about a year ago.  We put him on oral Cipro.  He did well.  He's currently on amoxicillin because he had strep throat.  Only 6 pills left.  No fever, chills, etc.  Allergies: No Known Drug Allergies  Past History:  Past medical, surgical, family and social histories (including risk factors) reviewed for relevance to current acute and chronic problems.  Past Medical History: Reviewed history from 10/15/2009 and no changes required. Hypertension Gastrointestinal hemorrhage, hx of Hyperlipidemia Erectile dysfunction Asymptomatic hematuria Coronary artery disease (March 2002 stenting of the right coronary artery; November 2006, 90% lesion of the first diagonal, 90% stenosis of the left circumflex.  He had Taxus stenting of the diagonal and PTCA of the circumflex.  He had 50-60% residual stenosis of the LAD) GI bleed, secondary to indomethacin  Past Surgical History: Reviewed history from 10/15/2009 and no changes required. Spinal fusion Colonoscopy-07/28/2003  Family History: Reviewed history from 03/09/2007 and no changes required. Family History of Alcoholism/Addiction Family History of CAD Male 1st degree relative <50 Fam hx Alzheimer's dz  Social History: Reviewed history from 09/21/2007 and no changes required. Former Smoker Retired Married Alcohol use-no Drug use-no Regular exercise-yes  Review of Systems      See HPI  Physical Exam  General:  Well-developed,well-nourished,in  no acute distress; alert,appropriate and cooperative throughout examination   Impression & Recommendations:  Problem # 1:  DYSURIA (ICD-788.1) Assessment New  The following medications were removed from the medication list:    Amoxicillin 500 Mg Caps (Amoxicillin) .Marland Kitchen... Take 1 tablet by mouth three times a day His updated medication list for this problem includes:    Ciprofloxacin Hcl 500 Mg Tabs (Ciprofloxacin hcl) .Marland Kitchen... Take 1 tablet by mouth two times a day x1 week, then 1 daily x 3 weeks  Orders: UA Dipstick w/o Micro (manual) (69629)  Complete Medication List: 1)  Hydrochlorothiazide 25 Mg Tabs (Hydrochlorothiazide) .... Take 1 tablet by mouth every morning 2)  Atenolol 50 Mg Tabs (Atenolol) .... Take 1 tablet by mouth two times a day 3)  Cardizem Cd 180 Mg Cp24 (Diltiazem hcl coated beads) .... Take 1 tablet by mouth every morning 4)  Plavix 75 Mg Tabs (Clopidogrel bisulfate) .... Take 1 tablet by mouth every morning 5)  Zocor 40 Mg Tabs (Simvastatin) .... Take 1 tablet by mouth every morning 6)  Nitrostat 0.4 Mg Subl (Nitroglycerin) .... One sl prn 7)  Viagra 100 Mg Tabs (Sildenafil citrate) .... Uad 8)  Ramipril 10 Mg Caps (Ramipril) .... Take one tab by mouth daily 9)  Klor-con M20 20 Meq Cr-tabs (Potassium chloride crys cr) .... Take one tab in the morning 10)  Centrum Silver Tabs (Multiple vitamins-minerals) .... Once daily 11)  Omega-3 Krill Oil 300 Mg Caps (Krill oil) .... One tab two times a day 12)  Ecotrin Low Strength 81 Mg Tbec (Aspirin) .... Once daily  13)  Protonix 40 Mg Tbec (Pantoprazole sodium) .... Once daily 14)  Osteo Bi-flex Adv Joint Shield Tabs (Misc natural products) .... Once daily 15)  Klor-con M20 20 Meq Cr-tabs (Potassium chloride crys cr) .... Take one tab by mouth once daily 16)  Ciprofloxacin Hcl 500 Mg Tabs (Ciprofloxacin hcl) .... Take 1 tablet by mouth two times a day x1 week, then 1 daily x 3 weeks  Patient Instructions: 1)  stop the  amoxicillin begin Cipro 500 mg twice a day, x 1 week, then 500 mg daily, x 3 weeks total Prescriptions: CIPROFLOXACIN HCL 500 MG TABS (CIPROFLOXACIN HCL) Take 1 tablet by mouth two times a day x1 week, then 1 daily x 3 weeks  #35 x 1   Entered and Authorized by:   Roderick Pee MD   Signed by:   Roderick Pee MD on 12/26/2009   Method used:   Electronically to        [facility] # [phone]* (retail)       [address]       [place], [place]  [address]       Ph: [phone]       Fax: [phone]   RxID:   [id number]   Laboratory Results   Urine Tests  Date/Time Received: Dec 26, 2009   Routine Urinalysis   Color: yellow Appearance: Clear Glucose: negative   (Normal Range: Negative) Bilirubin: negative   (Normal Range: Negative) Ketone: negative   (Normal Range: Negative) Spec. Gravity: 1.015   (Normal Range: 1.003-1.035) Blood: large   (Normal Range: Negative) pH: 6.0   (Normal Range: 5.0-8.0) Protein: trace   (Normal Range: Negative) Urobilinogen: 0.2   (Normal Range: 0-1) Nitrite: negative   (Normal Range: Negative) Leukocyte Esterace: negative   (Normal Range: Negative)    Comments: Kern Reap CMA (AAMA)  Dec 26, 2009 9:02 AM

## 2010-09-24 NOTE — Progress Notes (Signed)
Summary: refill  Phone Note Refill Request Message from:  Patient on November 02, 2009 3:10 PM  Refills Requested: Medication #1:  PLAVIX 75 MG  TABS Take 1 tablet by mouth every morning   Supply Requested: 3 months [facility] on [address]   Method Requested: Telephone to Pharmacy Initial call taken by: Migdalia Dk,  November 02, 2009 3:10 PM    Prescriptions: PLAVIX 75 MG  TABS (CLOPIDOGREL BISULFATE) Take 1 tablet by mouth every morning  #90 x 4   Entered by:   Kem Parkinson   Authorized by:   Rollene Rotunda, MD, [facility]   Signed by:   Kem Parkinson on 11/02/2009   Method used:   Electronically to        [facility] # [phone]* (retail)       [address]       [place], [place]  [address]       Ph: [phone]       Fax: [phone]   RxID:   [id number]

## 2010-09-24 NOTE — Procedures (Signed)
Summary: Upper Endoscopy  Patient: [name] Note: All result statuses are Final unless otherwise noted.  Tests: (1) Upper Endoscopy (EGD)   EGD Upper Endoscopy       DONE     [facility]     [address]     [place], [place]  [address]           ENDOSCOPY PROCEDURE REPORT           PATIENT:  [name], [name]  MR#:  [id number]     BIRTHDATE:  04-Nov-1940, 69 yrs. old  GENDER:  male     ENDOSCOPIST:  Judie Petit T. Russella Dar, MD, [facility]           PROCEDURE DATE:  02/13/2010     PROCEDURE:  EGD with biopsy     ASA CLASS:  Class II     INDICATIONS:  h/o Barrett's Esophagus     MEDICATIONS:  Fentanyl 75 mcg IV, Versed 7 mg IV     TOPICAL ANESTHETIC:  Exactacain Spray     DESCRIPTION OF PROCEDURE:   After the risks benefits and     alternatives of the procedure were thoroughly explained, informed     consent was obtained.  The LB GIF-H180 [id number] endoscope was     introduced through the mouth and advanced to the second portion of     the duodenum, without limitations.  The instrument was slowly     withdrawn as the mucosa was fully examined.     <<PROCEDUREIMAGES>>     Barrett's esophagus was found in the distal esophagus. It was 28 -     34 cm in length. Four quadrant biopsies were taken every 2 cm     through the length of the barretts.  Duodenitis was found in the     bulb of the duodenum. It was erythematous.  A benign appearing     stricture was found in the bulb of the duodenum. The stomach was     entered and closely examined. The pylorus, antrum, angularis, and     lesser curvature were well visualized, including a retroflexed     view of the cardia and fundus. The stomach wall was normally     distensable. The scope passed easily through the pylorus into the     duodenum. Retroflexed views revealed a hiatal hernia, 5 cm.  The     scope was then withdrawn from the patient and the procedure     completed.           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 Russella Dar, MD, [facility] Graham           CC:  Roderick Pee, MD           n.     Rosalie DoctorVenita Lick. Darlena Koval at 02/13/2010 02:50 PM  [place], [place], [phone]  Note: An exclamation mark (!) indicates a result that was not dispersed into the flowsheet. Document Creation Date: 02/13/2010 2:51 PM _______________________________________________________________________  (1) Order result status: Final Collection or observation date-time: 02/13/2010 14:41 Requested date-time:  Receipt date-time:  Reported date-time:  Referring Physician:   Ordering Physician: Claudette Head [phone]) Specimen Source:  Source: Launa Grill Order Number: [id number] Lab site:   Appended Document: Upper Endoscopy     Procedures Next Due Date:    EGD: 01/2013

## 2010-09-24 NOTE — Assessment & Plan Note (Signed)
Vital Signs:  Patient profile:   70 year old male Height:      65.5 inches Weight:      184 pounds BMI:     30.26 Temp:     98.6 degrees F oral BP sitting:   122 / 82  (left arm) Cuff size:   regular  Vitals Entered By: Kern Reap CMA Duncan Dull) (October 01, 2009 9:37 AM)  Reason for Visit cpx  History of Present Illness: [name] is a 70 year old, married male, nonsmoker, who comes in today for evaluation of numerous issues.  His underlying coronary disease.  He is stable with no chest pain.  He is to see Dr. Joslyn Hy soon therefore, we will defer his EKG to Dr. Leta Jungling.  For hypertension.  He takes atenolol 50 mg b.i.d., hydrochlorothiazide, 25 mg daily, Cardizem, 180 CD  ramipril  10 mg b.i.d. one potassium supplement daily.  BP 122/82  He also takes one aspirin in one Plavix daily.  He also takes Zocor 40 mg nightly for hyperlipidemia.  Will check lipid panel today.  He also uses Viagra 100 mg p.r.n. for erectile dysfunction.  He has diffuse osteoarthritis.  It's more affecting his knees recently.  He had a steroid injection by his orthopedist, which helped a lot.  He is not able to take oral anti-inflammatories because of a history of GI bleeding.  He takes protonic 40 mg daily.  He gets routine eye care.  Dental care.  Colonoscopy done in GI.  Tetanus 2008, flu, 2010, Pneumovax 2009, shingles 2010.  [facility] and exercises on a regular basis.  lab work came back normal, except for 2+ blood in his urine, which he previously had a urology evaluation, which was negative.  DX, asymptomatic, hematuria......Marland Kitchenjoe  was notified  Allergies (verified): No Known Drug Allergies  Past History:  Past medical, surgical, family and social histories (including risk factors) reviewed, and no changes noted (except as noted below).  Past Medical History: Reviewed history from 09/21/2007 and no changes required. 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 Percutaneous transluminal coronary angioplasty PTCA/stent Spinal fusion Colonoscopy-07/28/2003  Family History: Reviewed history from 03/09/2007 and no changes required. Family History of Alcoholism/Addiction Family History of CAD Male 1st degree relative <50 Fam hx Alzheimer's dz  Social History: Reviewed history from 09/21/2007 and no changes required. Former Smoker Retired Married Alcohol use-no Drug use-no Regular exercise-yes  Review of Systems      See HPI  Physical Exam  General:  Well-developed,well-nourished,in no acute distress; alert,appropriate and cooperative throughout examination Head:  Normocephalic and atraumatic without obvious abnormalities. No apparent alopecia or balding. Eyes:  No corneal or conjunctival inflammation noted. EOMI. Perrla. Funduscopic exam benign, without hemorrhages, exudates or papilledema. Vision grossly normal. Ears:  External ear exam shows no significant lesions or deformities.  Otoscopic examination reveals clear canals, tympanic membranes are intact bilaterally without bulging, retraction, inflammation or discharge. Hearing is grossly normal bilaterally. Nose:  External nasal examination shows no deformity or inflammation. Nasal mucosa are pink and moist without lesions or exudates. Mouth:  Oral mucosa and oropharynx without lesions or exudates.  Teeth in good repair. Neck:  No deformities, masses, or tenderness noted. Chest Wall:  No deformities, masses, tenderness  or gynecomastia noted. Breasts:  No masses or gynecomastia noted Lungs:  Normal respiratory effort, chest expands symmetrically. Lungs are clear to auscultation, no crackles or  wheezes. Heart:  Normal rate and regular rhythm. S1 and S2 normal without gallop, murmur, click, rub or other extra sounds. Abdomen:  Bowel sounds positive,abdomen soft and non-tender without masses, organomegaly or hernias noted. Rectal:  No external abnormalities noted. Normal sphincter tone. No rectal masses or tenderness. Genitalia:  Testes bilaterally descended without nodularity, tenderness or masses. No scrotal masses or lesions. No penis lesions or urethral discharge. Prostate:  Prostate gland firm and smooth, no enlargement, nodularity, tenderness, mass, asymmetry or induration. Msk:  No deformity or scoliosis noted of thoracic or lumbar spine.   Pulses:  R and L carotid,radial,femoral,dorsalis pedis and posterior tibial pulses are full and equal bilaterally Extremities:  No clubbing, cyanosis, edema, or deformity noted with normal full range of motion of all joints.   Neurologic:  No cranial nerve deficits noted. Station and gait are normal. Plantar reflexes are down-going bilaterally. DTRs are symmetrical throughout. Sensory, motor and coordinative functions appear intact. Skin:  Intact without suspicious lesions or rashes Cervical Nodes:  No lymphadenopathy noted Axillary Nodes:  No palpable lymphadenopathy Inguinal Nodes:  No significant adenopathy Psych:  Cognition and judgment appear intact. Alert and cooperative with normal attention span and concentration. No apparent delusions, illusions, hallucinations   Impression & Recommendations:  Problem # 1:  HYPERLIPIDEMIA (ICD-272.4) Assessment Improved  His updated medication list for this problem includes:    Zocor 40 Mg Tabs (Simvastatin) .Marland Kitchen... Take 1 tablet by mouth every morning  Orders: Venipuncture (29562) Prescription Created Electronically [phone]) TLB-Lipid Panel (80061-LIPID) TLB-BMP (Basic Metabolic Panel-BMET) (80048-METABOL) TLB-CBC Platelet - w/Differential (85025-CBCD) TLB-Hepatic/Liver Function Pnl  (80076-HEPATIC) TLB-TSH (Thyroid Stimulating Hormone) (84443-TSH) TLB-PSA (Prostate Specific Antigen) (84153-PSA)  Problem # 2:  ERECTILE DYSFUNCTION (ICD-302.72) Assessment: Improved  His updated medication list for this problem includes:    Viagra 100 Mg Tabs (Sildenafil citrate) ..... Uad  Orders: Venipuncture (57846) Prescription Created Electronically [phone]) TLB-Lipid Panel (80061-LIPID) TLB-BMP (Basic Metabolic Panel-BMET) (80048-METABOL) TLB-CBC Platelet - w/Differential (85025-CBCD) TLB-Hepatic/Liver Function Pnl (80076-HEPATIC) TLB-TSH (Thyroid Stimulating Hormone) (84443-TSH) TLB-PSA (Prostate Specific Antigen) (84153-PSA)  Problem # 3:  HYPERTENSION (ICD-401.9) Assessment: Improved  His updated medication list for this problem includes:    Hydrochlorothiazide 25 Mg Tabs (Hydrochlorothiazide) .Marland Kitchen... Take 1 tablet by mouth every morning    Atenolol 50 Mg Tabs (Atenolol) .Marland Kitchen... Take 1 tablet by mouth two times a day    Cardizem Cd 180 Mg Cp24 (Diltiazem hcl coated beads) .Marland Kitchen... Take 1 tablet by mouth every morning    Ramipril 10 Mg Caps (Ramipril) .Marland Kitchen... Take one tab two times a day  Orders: Venipuncture (28413) Prescription Created Electronically [phone]) UA Dipstick w/o Micro (automated)  (81003) TLB-Lipid Panel (80061-LIPID) TLB-BMP (Basic Metabolic Panel-BMET) (80048-METABOL) TLB-CBC Platelet - w/Differential (85025-CBCD) TLB-Hepatic/Liver Function Pnl (80076-HEPATIC) TLB-TSH (Thyroid Stimulating Hormone) (84443-TSH) TLB-PSA (Prostate Specific Antigen) (84153-PSA)  Problem # 4:  OSTEOARTHRITIS, KNEES, BILATERAL (ICD-715.96) Assessment: Deteriorated  His updated medication list for this problem includes:    Ecotrin Low Strength 81 Mg Tbec (Aspirin) ..... Once daily  Orders: Venipuncture (02725) Prescription Created Electronically [phone]) TLB-Lipid Panel (80061-LIPID) TLB-BMP (Basic Metabolic Panel-BMET) (80048-METABOL) TLB-CBC Platelet - w/Differential  (85025-CBCD) TLB-Hepatic/Liver Function Pnl (80076-HEPATIC) TLB-TSH (Thyroid Stimulating Hormone) (84443-TSH) TLB-PSA (Prostate Specific Antigen) (84153-PSA)  Complete Medication List: 1)  Hydrochlorothiazide 25 Mg Tabs (Hydrochlorothiazide) .... Take 1 tablet by mouth every morning 2)  Atenolol 50 Mg  Tabs (Atenolol) .... Take 1 tablet by mouth two times a day 3)  Cardizem Cd 180 Mg Cp24 (Diltiazem hcl coated beads) .... Take 1 tablet by mouth every morning 4)  Plavix 75 Mg Tabs (Clopidogrel bisulfate) .... Take 1 tablet by mouth every morning 5)  Zocor 40 Mg Tabs (Simvastatin) .... Take 1 tablet by mouth every morning 6)  Nitrostat 0.4 Mg Subl (Nitroglycerin) .... One sl prn 7)  Viagra 100 Mg Tabs (Sildenafil citrate) .... Uad 8)  Ramipril 10 Mg Caps (Ramipril) .... Take one tab two times a day 9)  Klor-con M20 20 Meq Cr-tabs (Potassium chloride crys cr) .... Take one tab in the morning 10)  Centrum Silver Tabs (Multiple vitamins-minerals) .... Once daily 11)  Omega-3 Krill Oil 300 Mg Caps (Krill oil) .... One tab two times a day 12)  Ecotrin Low Strength 81 Mg Tbec (Aspirin) .... Once daily 13)  Protonix 40 Mg Tbec (Pantoprazole sodium) .... Once daily 14)  Osteo Bi-flex Adv Joint Shield Tabs (Misc natural products) .... Once daily  Patient Instructions: 1)  I would consider returning to see the orthopedist if he needs continued to bother you. 2)  Continue your current excellent health habits. 3)  Please schedule a follow-up appointment in 1 year. Prescriptions: PROTONIX 40 MG TBEC (PANTOPRAZOLE SODIUM) once daily  #100 x 4   Entered and Authorized by:   Roderick Pee MD   Signed by:   Roderick Pee MD on 10/01/2009   Method used:   Print then Give to Patient   RxID:   [id number] KLOR-CON M20 20 MEQ CR-TABS (POTASSIUM CHLORIDE CRYS CR) take one tab in the morning  #0 x 0   Entered and Authorized by:   Roderick Pee MD   Signed by:   Roderick Pee MD on 10/01/2009    Method used:   Print then Give to Patient   RxID:   [id number] RAMIPRIL 10 MG CAPS (RAMIPRIL) take one tab two times a day  #200 x 4   Entered and Authorized by:   Roderick Pee MD   Signed by:   Roderick Pee MD on 10/01/2009   Method used:   Print then Give to Patient   RxID:   [id number] VIAGRA 100 MG  TABS (SILDENAFIL CITRATE) UAD  #6 x 11   Entered and Authorized by:   Roderick Pee MD   Signed by:   Roderick Pee MD on 10/01/2009   Method used:   Print then Give to Patient   RxID:   [id number] NITROSTAT 0.4 MG  SUBL (NITROGLYCERIN) one sl prn  #25 x 1   Entered and Authorized by:   Roderick Pee MD   Signed by:   Roderick Pee MD on 10/01/2009   Method used:   Print then Give to Patient   RxID:   [id number] ZOCOR 40 MG  TABS (SIMVASTATIN) Take 1 tablet by mouth every morning  #100 x 4   Entered and Authorized by:   Roderick Pee MD   Signed by:   Roderick Pee MD on 10/01/2009   Method used:   Print then Give to Patient   RxID:   [id number] PLAVIX 75 MG  TABS (CLOPIDOGREL BISULFATE) Take 1 tablet by mouth every morning  #100 x 4   Entered and Authorized by:   Roderick Pee MD   Signed by:   Roderick Pee MD on 10/01/2009   Method  used:   Print then Give to Patient   RxID:   [id number] CARDIZEM CD 180 MG  CP24 (DILTIAZEM HCL COATED BEADS) Take 1 tablet by mouth every morning  #100 x 4   Entered and Authorized by:   Roderick Pee MD   Signed by:   Roderick Pee MD on 10/01/2009   Method used:   Print then Give to Patient   RxID:   [id number] ATENOLOL 50 MG  TABS (ATENOLOL) Take 1 tablet by mouth two times a day  #200 x 4   Entered and Authorized by:   Roderick Pee MD   Signed by:   Roderick Pee MD on 10/01/2009   Method used:   Print then Give to Patient   RxID:   [id number] HYDROCHLOROTHIAZIDE 25 MG  TABS (HYDROCHLOROTHIAZIDE) Take 1 tablet by mouth every morning  #100 x 4   Entered and Authorized by:   Roderick Pee MD   Signed by:   Roderick Pee MD on 10/01/2009   Method used:   Print then Give to Patient   RxID:   [id number]     Laboratory Results   Urine Tests    Routine Urinalysis   Color: yellow Appearance: Clear Glucose: negative   (Normal Range: Negative) Bilirubin: negative   (Normal Range: Negative) Ketone: negative   (Normal Range: Negative) Spec. Gravity: 1.015   (Normal Range: 1.003-1.035) Blood: 2+   (Normal Range: Negative) pH: 5.5   (Normal Range: 5.0-8.0) Protein: negative   (Normal Range: Negative) Urobilinogen: 0.2   (Normal Range: 0-1) Nitrite: negative   (Normal Range: Negative) Leukocyte Esterace: negative   (Normal Range: Negative)    Comments: Rita Ohara  October 01, 2009 2:49 PM

## 2010-09-24 NOTE — Procedures (Signed)
Summary: Colonoscopy   Colonoscopy  Procedure date:  07/28/2003  Findings:      Results: Hemorrhoids.     Results: Diverticulosis.       Location:  [facility].    Procedures Next Due Date:    Colonoscopy: 07/2010  Colonoscopy  Procedure date:  07/28/2003  Findings:      Results: Hemorrhoids.     Results: Diverticulosis.       Location:  [facility].    Procedures Next Due Date:    Colonoscopy: 07/2010 Patient Name: Steven, [name] MRN:  Procedure Procedures: Colonoscopy CPT: 76283.  Personnel: Endoscopist: Venita Lick. Russella Dar, MD, Clementeen Graham.  Referred By: Eugenio Hoes Tawanna Cooler, MD.  Exam Location: Exam performed in Outpatient Clinic. Outpatient  Patient Consent: Procedure, Alternatives, Risks and Benefits discussed, consent obtained, from patient. Consent was obtained by the RN.  Indications  Average Risk Screening Routine.  History  Current Medications: Patient is not currently taking Coumadin.  Pre-Exam Physical: Performed Jul 28, 2003. Entire physical exam was normal.  Exam Exam: Extent of exam reached: Cecum, extent intended: Cecum.  The cecum was identified by appendiceal orifice. Colon retroflexion performed. ASA Classification: II. Tolerance: excellent.  Monitoring: Pulse and BP monitoring, Oximetry used. Supplemental O2 given.  Colon Prep Used Visicol for colon prep. Prep results: good.  Sedation Meds: Patient assessed and found to be appropriate for moderate (conscious) sedation. Fentanyl 75 mcg. given IV. Versed 7 mg. given IV.  Findings DIVERTICULOSIS: Descending Colon to Sigmoid Colon. Not bleeding. ICD9: Diverticulosis: 562.10.  NORMAL EXAM: Cecum to Splenic Flexure.  HEMORRHOIDS: Internal. Size: Small. Not bleeding. Not thrombosed. ICD9: Hemorrhoids, Internal: 455.0.   Assessment  Diagnoses: 562.10: Diverticulosis.  455.0: Hemorrhoids, Internal.   Events  Unplanned Interventions: No intervention was required.    Unplanned Events: There were no complications. Plans Medication Plan: Continue current medications.  Patient Education: Patient given standard instructions for: Diverticulosis. Hemorrhoids.  Disposition: After procedure patient sent to recovery. After recovery patient sent home.  Scheduling/Referral: Colonoscopy, to [facility] T. Russella Dar, MD, [facility], around Jul 27, 2010.    This report was created from the original endoscopy report, which was reviewed and signed by the above listed endoscopist.    cc: Tinnie Gens A. Tawanna Cooler, MD

## 2010-09-24 NOTE — Discharge Summary (Signed)
Summary: Upper GI bleed   NAME:  [name], [name]              ACCOUNT NO.:  [id number]   MEDICAL RECORD NO.:  [id number]          PATIENT TYPE:  INP   LOCATION:  5703                         FACILITY:  [facility]   PHYSICIAN:  Sandford Craze, NP DATE OF BIRTH:  [birth year]   DATE OF ADMISSION:  05/08/2006  DATE OF DISCHARGE:  05/11/2006                                 DISCHARGE SUMMARY   DISCHARGE DIAGNOSES:  1. Upper GI bleed status post endoscopy May 10, 2006, performed by      Dr. Rob Bunting which revealed Barrett's esophagus.  2. Gastroesophageal reflux disease.  3. History of coronary artery disease status post drug-eluting stent      December 2006.  4. History of arthritis followed by Dr. Thyra Breed in pain clinic.  5. Anemia secondary to upper GI bleed status post 2 units packet blood      cells this hospitalization.   HISTORY OF PRESENT ILLNESS:  [name] is a 70 year old male who has been  on chronic Indocin to treat osteoarthritis.  He noted black colored stools 2-  3 days in a row, which started approximately 4 days prior to admission. This  was accompanied by dyspnea on exertion, and [name] presents to the  emergency department.  The patient was admitted for further evaluation and  treatment.   PAST MEDICAL HISTORY:  1. Degenerative joint disease.  2. Status post C-spine fusion in the past.  3. Hypertension.  4. Dyslipidemia.  5. History of erectile dysfunction.  6. Coronary artery disease status post MI 2002 with stent placement at      that time and recent drug-eluting stent December 2006.  7. History of diverticular disease.   COURSE OF HOSPITALIZATION:  #1 - UPPER GI BLEED SECONDARY TO NSAID AND  ASPIRIN USE.  [name] was admitted and was noted on admission to have a  hemoglobin of 8.3.  As the patient was symptomatic he was transfused 2 units  packed red blood cells during this hospitalization.  Hemoglobin at time of  discharge is  10.0.   [name] was seen in consultation by  GI and underwent an  endoscopy which was performed on April 30, 2006.  Endoscopy noted  Barrett's esophagus, and it was recommended that aspirin and Plavix be held  for seven additional days and then be restarted.  Also of note, the patient  was evaluated by [facility] during this admission due to drug-  eluting stent which was placed November 2006, and recommendations on aspirin  and Plavix management.  It was felt that since he had greater than 6 months  and stents were placed, the risk of thrombosis was relatively small but  still existed.  It was recommended that Plavix be resumed pending results of  the EGD and that the patient be continued on his beta blocker.  It was also  recommended that his antihypertensives and Statins be resumed once his BP  and hemoglobin are stable.   It was felt that the patient should be continued on twice daily Protonix  until further directed by Dr.  Russella Dar, and that aspirin be changed to 81 mg  daily.   #2 - CHRONIC PAIN.  It was recommended that Indocin be discontinued and that  the patient may need to be considered for a non-NSAID pain medication.  This  will need to be deferred to pain clinic.   MEDICATIONS AT DISCHARGE:  1. Protonix 40 mg p.o. b.i.d.  2. aspirin 81 mg p.o. daily to start on September 24.  3. Plavix 75 mg p.o. daily to start September 24.  4. Atenolol 50 mg p.o. b.i.d.  5. HCTZ 25 mg p.o. daily.  6. Cardiac XT 180 mg p.o. daily.  7. Altace 10 mg p.o. b.i.d.  8. Vytorin 40/10 one tablet p.o. daily.  9. NitroQuick 0.4 mg as needed sublingual.  10.Centrum Silver one tablet p.o. daily.  11.The patient is instructed to discontinue indomethacin.   1. The patient is instructed to hold HCTZ, Cardia and Altace until follow      up with Dr. Tawanna Cooler.   PERTINENT LABORATORY DATA:  At discharge hemoglobin 10, hematocrit 29.4,  hemoglobin A1c 6.0, BUN 40, creatinine 1.5.    DISPOSITION:  Plan to transfer the patient to home.  Has no skilled needs at  this time.   FOLLOW UP:  The patient is instructed to follow up with Dr. Russella Dar on  Tuesday, October 9, 8:45 a.m.  He is also instructed to follow up with Dr.  Tawanna Cooler in the next 1-2 weeks.           ______________________________  Sandford Craze, NP     MO/MEDQ  D:  05/11/2006  T:  05/12/2006  Job:  [id number]   cc:   Tinnie Gens A. Tawanna Cooler, MD  Kathrin Penner. Vear Clock, M.D.

## 2010-09-24 NOTE — Progress Notes (Signed)
Summary: klor con refill  Phone Note Refill Request      New/Updated Medications: KLOR-CON M20 20 MEQ CR-TABS (POTASSIUM CHLORIDE CRYS CR) take one tab by mouth once daily Prescriptions: KLOR-CON M20 20 MEQ CR-TABS (POTASSIUM CHLORIDE CRYS CR) take one tab by mouth once daily  #100 x 3   Entered by:   Kern Reap CMA (AAMA)   Authorized by:   Roderick Pee MD   Signed by:   Kern Reap CMA (AAMA) on 10/16/2009   Method used:   Print then Give to Patient   RxID:   [id number]

## 2010-09-24 NOTE — Assessment & Plan Note (Signed)
Summary: sore throat/ear ache/swollen glands/low grade fever/cjr   Vital Signs:  Patient profile:   70 year old male Weight:      179 pounds Temp:     101.1 degrees F oral BP sitting:   120 / 80  (left arm) Cuff size:   regular  Vitals Entered By: Kern Reap CMA Duncan Dull) (December 20, 2009 9:24 AM) CC: sore throat, ear pain, swollen glans Is Patient Diabetic? No   Primary Care Provider:  Roderick Pee MD  CC:  sore throat, ear pain, and swollen glans.  History of Present Illness: [name] is a 70 year old, married male, nonsmoker, who comes in today for evaluation of a sore throat.  His granddaughter had strep throat two weeks ago.  He began having symptoms of a sore throat two days ago.  He said, fever, chills, sore throat, and swollen lymph nodes.  No earache, cough, et Karie Soda.  Rapid strep positive  Allergies: No Known Drug Allergies  Past History:  Past medical, surgical, family and social histories (including risk factors) reviewed for relevance to current acute and chronic problems.  Past Medical History: Reviewed history from 10/15/2009 and no changes required. Hypertension Gastrointestinal hemorrhage, hx of Hyperlipidemia Erectile dysfunction Asymptomatic hematuria Coronary artery disease (March 2002 stenting of the right coronary artery; November 2006, 90% lesion of the first diagonal, 90% stenosis of the left circumflex.  He had Taxus stenting of the diagonal and PTCA of the circumflex.  He had 50-60% residual stenosis of the LAD) GI bleed, secondary to indomethacin  Past Surgical History: Reviewed history from 10/15/2009 and no changes required. Spinal fusion Colonoscopy-07/28/2003  Family History: Reviewed history from 03/09/2007 and no changes required. Family History of Alcoholism/Addiction Family History of CAD Male 1st degree relative <50 Fam hx Alzheimer's dz  Social History: Reviewed history from 09/21/2007 and no changes required. Former  Smoker Retired Married Alcohol use-no Drug use-no Regular exercise-yes  Review of Systems      See HPI  Physical Exam  General:  Well-developed,well-nourished,in no acute distress; alert,appropriate and cooperative throughout examination Head:  Normocephalic and atraumatic without obvious abnormalities. No apparent alopecia or balding. Eyes:  No corneal or conjunctival inflammation noted. EOMI. Perrla. Funduscopic exam benign, without hemorrhages, exudates or papilledema. Vision grossly normal. Ears:  External ear exam shows no significant lesions or deformities.  Otoscopic examination reveals clear canals, tympanic membranes are intact bilaterally without bulging, retraction, inflammation or discharge. Hearing is grossly normal bilaterally. Nose:  External nasal examination shows no deformity or inflammation. Nasal mucosa are pink and moist without lesions or exudates. Mouth:  Oral mucosa and oropharynx without lesions or exudates.  Teeth in good repair. Neck:  No deformities, masses, or tenderness noted. Chest Wall:  No deformities, masses, tenderness or gynecomastia noted. Lungs:  Normal respiratory effort, chest expands symmetrically. Lungs are clear to auscultation, no crackles or wheezes. Cervical Nodes:  R anterior LN tender and L anterior LN tender.     Problems:  Medical Problems Added: 1)  Dx of Strep Throat  (ICD-034.0) 2)  Dx of Sore Throat  (ICD-462)  Impression & Recommendations:  Problem # 1:  STREP THROAT (ICD-034.0) Assessment New  His updated medication list for this problem includes:    Ecotrin Low Strength 81 Mg Tbec (Aspirin) ..... Once daily    Amoxicillin 500 Mg Caps (Amoxicillin) .Marland Kitchen... Take 1 tablet by mouth three times a day  Orders: Prescription Created Electronically [phone])  Complete Medication List: 1)  Hydrochlorothiazide 25 Mg Tabs (Hydrochlorothiazide) .Marland KitchenMarland KitchenMarland Kitchen  Take 1 tablet by mouth every morning 2)  Atenolol 50 Mg Tabs (Atenolol) .... Take 1  tablet by mouth two times a day 3)  Cardizem Cd 180 Mg Cp24 (Diltiazem hcl coated beads) .... Take 1 tablet by mouth every morning 4)  Plavix 75 Mg Tabs (Clopidogrel bisulfate) .... Take 1 tablet by mouth every morning 5)  Zocor 40 Mg Tabs (Simvastatin) .... Take 1 tablet by mouth every morning 6)  Nitrostat 0.4 Mg Subl (Nitroglycerin) .... One sl prn 7)  Viagra 100 Mg Tabs (Sildenafil citrate) .... Uad 8)  Ramipril 10 Mg Caps (Ramipril) .... Take one tab by mouth daily 9)  Klor-con M20 20 Meq Cr-tabs (Potassium chloride crys cr) .... Take one tab in the morning 10)  Centrum Silver Tabs (Multiple vitamins-minerals) .... Once daily 11)  Omega-3 Krill Oil 300 Mg Caps (Krill oil) .... One tab two times a day 12)  Ecotrin Low Strength 81 Mg Tbec (Aspirin) .... Once daily 13)  Protonix 40 Mg Tbec (Pantoprazole sodium) .... Once daily 14)  Osteo Bi-flex Adv Joint Shield Tabs (Misc natural products) .... Once daily 15)  Klor-con M20 20 Meq Cr-tabs (Potassium chloride crys cr) .... Take one tab by mouth once daily 16)  Amoxicillin 500 Mg Caps (Amoxicillin) .... Take 1 tablet by mouth three times a day  Other Orders: Rapid Strep ([id number])  Patient Instructions: 1)  begin amoxicillin, 500 mg 3 times a day to the bottle empty.  Return p.r.n. Prescriptions: AMOXICILLIN 500 MG CAPS (AMOXICILLIN) Take 1 tablet by mouth three times a day  #30 x 0   Entered and Authorized by:   Roderick Pee MD   Signed by:   Roderick Pee MD on 12/20/2009   Method used:   Electronically to        [facility] # [phone]* (retail)       [address]       [place], [place]  [address]       Ph: [phone]       Fax: [phone]   RxID:   [id number]   Laboratory Results  Date/Time Received: December 20, 2009   Other Tests  Rapid Strep: positive Comments: Kern Reap CMA Duncan Dull)  December 20, 2009 10:36 AM

## 2010-09-24 NOTE — Letter (Signed)
Summary: Endoscopy Letter  [facility]  [address] [place], [place] [address]   Phone: [phone]  Fax: [phone]      December 03, 2009 MRN: [id number]   [name] [address] [place], [place]  [address]   Dear [name],   According to your medical record, it is time for you to schedule an Endoscopy. Endoscopic screening is recommended for patients with certain upper digestive tract conditions because of associated increased risk for cancers of the upper digestive system.  This letter has been generated based on the recommendations made at the time of your prior procedure. If you feel that in your particular situation this may no longer apply, please contact our office.  Please call our office at [phone]) to schedule this appointment or to update your records at your earliest convenience.  Thank you for cooperating with [place] to provide you with the very best care possible.   Sincerely,  Judie Petit T. Russella Dar, M.D.  [facility] [phone]

## 2010-09-26 NOTE — Progress Notes (Signed)
Summary: rx question  Phone Note From Other Clinic   Summary of Call: call from prescription [facility]--[phone]--pt is taking simvastatin 40 and diltiazem-does Dr Tawanna Cooler want to lower dose of simvastatin since on diltiazem?---refernce number is [phone] Initial call taken by: Willy Eddy, LPN,  August 08, 2010 4:17 PM  Follow-up for Phone Call        no Follow-up by: Roderick Pee MD,  August 08, 2010 5:48 PM  Additional Follow-up for Phone Call Additional follow up Details #1::        left message on machine for pharmacy Additional Follow-up by: Kern Reap CMA Duncan Dull),  August 14, 2010 1:56 PM

## 2010-10-08 ENCOUNTER — Other Ambulatory Visit: Payer: Self-pay | Admitting: Family Medicine

## 2010-11-08 ENCOUNTER — Ambulatory Visit: Payer: Self-pay | Admitting: Family Medicine

## 2010-11-11 ENCOUNTER — Ambulatory Visit: Payer: Self-pay | Admitting: Cardiology

## 2010-11-12 ENCOUNTER — Encounter: Payer: Self-pay | Admitting: Family Medicine

## 2010-11-13 ENCOUNTER — Ambulatory Visit (INDEPENDENT_AMBULATORY_CARE_PROVIDER_SITE_OTHER): Payer: Medicare Other | Admitting: Family Medicine

## 2010-11-13 ENCOUNTER — Encounter: Payer: Self-pay | Admitting: Family Medicine

## 2010-11-13 DIAGNOSIS — N401 Enlarged prostate with lower urinary tract symptoms: Secondary | ICD-10-CM

## 2010-11-13 DIAGNOSIS — K2270 Barrett's esophagus without dysplasia: Secondary | ICD-10-CM

## 2010-11-13 DIAGNOSIS — N138 Other obstructive and reflux uropathy: Secondary | ICD-10-CM | POA: Insufficient documentation

## 2010-11-13 DIAGNOSIS — E785 Hyperlipidemia, unspecified: Secondary | ICD-10-CM

## 2010-11-13 DIAGNOSIS — I10 Essential (primary) hypertension: Secondary | ICD-10-CM

## 2010-11-13 DIAGNOSIS — F528 Other sexual dysfunction not due to a substance or known physiological condition: Secondary | ICD-10-CM

## 2010-11-13 LAB — CBC WITH DIFFERENTIAL/PLATELET
Basophils Relative: 0.4 % (ref 0.0–3.0)
Eosinophils Absolute: 0.2 10*3/uL (ref 0.0–0.7)
Eosinophils Relative: 2.4 % (ref 0.0–5.0)
Hemoglobin: 14.7 g/dL (ref 13.0–17.0)
MCHC: 34.4 g/dL (ref 30.0–36.0)
MCV: 94.6 fl (ref 78.0–100.0)
Monocytes Absolute: 0.8 10*3/uL (ref 0.1–1.0)
Neutro Abs: 3.9 10*3/uL (ref 1.4–7.7)
Neutrophils Relative %: 51.9 % (ref 43.0–77.0)
RBC: 4.53 Mil/uL (ref 4.22–5.81)
WBC: 7.5 10*3/uL (ref 4.5–10.5)

## 2010-11-13 LAB — BASIC METABOLIC PANEL
CO2: 32 mEq/L (ref 19–32)
Chloride: 102 mEq/L (ref 96–112)
Creatinine, Ser: 1.4 mg/dL (ref 0.4–1.5)
Potassium: 4.7 mEq/L (ref 3.5–5.1)
Sodium: 140 mEq/L (ref 135–145)

## 2010-11-13 LAB — HEPATIC FUNCTION PANEL
ALT: 21 U/L (ref 0–53)
Albumin: 4.3 g/dL (ref 3.5–5.2)
Alkaline Phosphatase: 53 U/L (ref 39–117)
Bilirubin, Direct: 0.1 mg/dL (ref 0.0–0.3)
Total Protein: 7.1 g/dL (ref 6.0–8.3)

## 2010-11-13 LAB — TSH: TSH: 0.65 u[IU]/mL (ref 0.35–5.50)

## 2010-11-13 LAB — POCT URINALYSIS DIPSTICK
Bilirubin, UA: NEGATIVE
Glucose, UA: NEGATIVE
Ketones, UA: NEGATIVE
Leukocytes, UA: NEGATIVE
Nitrite, UA: NEGATIVE

## 2010-11-13 LAB — LIPID PANEL
Total CHOL/HDL Ratio: 3
Triglycerides: 146 mg/dL (ref 0.0–149.0)

## 2010-11-13 MED ORDER — SIMVASTATIN 40 MG PO TABS: 40.0000 mg | ORAL_TABLET | Freq: Every day | ORAL | Status: DC

## 2010-11-13 MED ORDER — CLOPIDOGREL BISULFATE 75 MG PO TABS: 75.0000 mg | ORAL_TABLET | Freq: Every day | ORAL | Status: DC

## 2010-11-13 MED ORDER — SILDENAFIL CITRATE 100 MG PO TABS: 100.0000 mg | ORAL_TABLET | ORAL | Status: DC | PRN

## 2010-11-13 MED ORDER — NITROGLYCERIN 0.4 MG SL SUBL: 0.4000 mg | SUBLINGUAL_TABLET | SUBLINGUAL | Status: DC | PRN

## 2010-11-13 MED ORDER — DILTIAZEM HCL ER COATED BEADS 180 MG PO CP24: 180.0000 mg | ORAL_CAPSULE | Freq: Every day | ORAL | Status: DC

## 2010-11-13 MED ORDER — ATENOLOL 50 MG PO TABS: 50.0000 mg | ORAL_TABLET | Freq: Two times a day (BID) | ORAL | Status: DC

## 2010-11-13 MED ORDER — PANTOPRAZOLE SODIUM 40 MG PO TBEC: 40.0000 mg | DELAYED_RELEASE_TABLET | Freq: Every day | ORAL | Status: DC

## 2010-11-13 MED ORDER — POTASSIUM CHLORIDE 10 MEQ PO TBCR: 10.0000 meq | EXTENDED_RELEASE_TABLET | Freq: Every day | ORAL | Status: DC

## 2010-11-13 MED ORDER — HYDROCHLOROTHIAZIDE 25 MG PO TABS: 25.0000 mg | ORAL_TABLET | Freq: Every day | ORAL | Status: DC

## 2010-11-13 MED ORDER — RAMIPRIL 10 MG PO TABS: 10.0000 mg | ORAL_TABLET | Freq: Two times a day (BID) | ORAL | Status: DC

## 2010-11-13 NOTE — Patient Instructions (Signed)
Continue your current medications.  Follow-up in one year, sooner if any problems 

## 2010-11-13 NOTE — Progress Notes (Signed)
  Subjective:    Patient ID: [name], male    DOB: Dec 22, 1940, 70 y.o.   MRN: [id number]  HPIJoseph is a delightful, 70 year old, married man nonsmoker, who comes in to see me today for follow-up of hypertension, hyperlipidemia, erectile dysfunction, and reflux esophagitis with Barrett's esophagus.  He stated see his cardiologist, Dr. [facility] next week for cardiac follow-up.  His hypertension is treated with Tenormin 50 mg daily, Hydrochlorothiazide  25 mg daily, Altace, 10 mg daily, BP 120/70.  His hyperlipidemia is treated with simvastatin 40 mg nightly and one baby aspirin.  Check lipid panel today.  The reflux is treated with protonic 40 mg daily asymptomatic on medication.  The erectile dysfunction is treated withviagra  100 mg p.r.n.    Review of Systems  Constitutional: Negative.   HENT: Negative.   Eyes: Negative.   Respiratory: Negative.   Cardiovascular: Negative.   Gastrointestinal: Negative.   Genitourinary: Negative.   Musculoskeletal: Negative.   Skin: Negative.   Neurological: Negative.   Hematological: Negative.   Psychiatric/Behavioral: Negative.        Objective:   Physical Exam  Constitutional: He is oriented to person, place, and time. He appears well-developed and well-nourished.  HENT:  Head: Normocephalic and atraumatic.  Right Ear: External ear normal.  Left Ear: External ear normal.  Nose: Nose normal.  Mouth/Throat: Oropharynx is clear and moist.  Eyes: Conjunctivae and EOM are normal. Pupils are equal, round, and reactive to light.  Neck: Normal range of motion. Neck supple. No JVD present. No tracheal deviation present. No thyromegaly present.  Cardiovascular: Normal rate, regular rhythm, normal heart sounds and intact distal pulses.  Exam reveals no gallop and no friction rub.   No murmur heard. Pulmonary/Chest: Effort normal and breath sounds normal. No stridor. No respiratory distress. He has no wheezes. He has no rales. He exhibits no  tenderness.  Abdominal: Soft. Bowel sounds are normal. He exhibits no distension and no mass. There is no tenderness. There is no rebound and no guarding.  Genitourinary: Rectum normal, prostate normal and penis normal. Guaiac negative stool. No penile tenderness.  Musculoskeletal: Normal range of motion. He exhibits no edema and no tenderness.  Lymphadenopathy:    He has no cervical adenopathy.  Neurological: He is alert and oriented to person, place, and time. He has normal reflexes. No cranial nerve deficit. He exhibits normal muscle tone.  Skin: Skin is warm and dry. No rash noted. No erythema. No pallor.  Psychiatric: He has a normal mood and affect. His behavior is normal. Judgment and thought content normal.          Assessment & Plan:  Hypertension under good control continue above therapy.  Hyperlipidemia.  Check lipid pain.  No  History of reflux esophagitis with Barrett's esophagus continue protonic 40 daily.  Erectile dysfunction continue Viagra p.r.n.  Cardiac follow up next week.  Continue cardiac medications per cardiologist, Dr. Leta [name], however, will go ahead and refill on his medications now on the computer

## 2010-11-14 ENCOUNTER — Encounter: Payer: Self-pay | Admitting: Cardiology

## 2010-11-26 ENCOUNTER — Encounter: Payer: Self-pay | Admitting: Cardiology

## 2010-11-26 ENCOUNTER — Ambulatory Visit (INDEPENDENT_AMBULATORY_CARE_PROVIDER_SITE_OTHER): Payer: Medicare Other | Admitting: Cardiology

## 2010-11-26 VITALS — BP 113/71 | HR 59 | Ht 65.0 in | Wt 177.0 lb

## 2010-11-26 DIAGNOSIS — I2510 Atherosclerotic heart disease of native coronary artery without angina pectoris: Secondary | ICD-10-CM

## 2010-11-26 DIAGNOSIS — E785 Hyperlipidemia, unspecified: Secondary | ICD-10-CM

## 2010-11-26 DIAGNOSIS — I10 Essential (primary) hypertension: Secondary | ICD-10-CM

## 2010-11-26 MED ORDER — ATORVASTATIN CALCIUM 20 MG PO TABS: 20.0000 mg | ORAL_TABLET | Freq: Every day | ORAL | Status: DC

## 2010-11-26 NOTE — Progress Notes (Signed)
HPI The patient presents for routine followup. Since I last saw him he has done quite well. He walks frequently outside his exercises at the [facility]. He gets his heart rate up.The patient denies any new symptoms such as chest discomfort, neck or arm discomfort. There has been no new shortness of breath, PND or orthopnea. There have been no reported palpitations, presyncope or syncope.  I reviewed his lipids, weights and blood pressures and they have been excellent. However, I do note the interaction between diltiazem and Zocor.     No Known Allergies  Current Outpatient Prescriptions  Medication Sig Dispense Refill  . aspirin 81 MG EC tablet Take 81 mg by mouth daily.        Marland Kitchen atenolol (TENORMIN) 50 MG tablet Take 1 tablet (50 mg total) by mouth 2 (two) times daily.  200 tablet  3  . clopidogrel (PLAVIX) 75 MG tablet Take 1 tablet (75 mg total) by mouth daily.  100 tablet  3  . diltiazem (CARDIZEM CD) 180 MG 24 hr capsule Take 1 capsule (180 mg total) by mouth daily.  100 capsule  3  . hydrochlorothiazide 25 MG tablet Take 1 tablet (25 mg total) by mouth daily.  100 tablet  3  . Multiple Vitamins-Minerals (CENTRUM PO) Take 1 capsule by mouth daily.        . nitroGLYCERIN (NITROSTAT) 0.4 MG SL tablet Place 1 tablet (0.4 mg total) under the tongue every 5 (five) minutes as needed.  25 tablet  1  . pantoprazole (PROTONIX) 40 MG tablet Take 1 tablet (40 mg total) by mouth daily.  100 tablet  3  . potassium chloride (KLOR-CON) 10 MEQ CR tablet Take 1 tablet (10 mEq total) by mouth daily.  100 tablet  3  . ramipril (ALTACE) 10 MG tablet Take 1 tablet (10 mg total) by mouth 2 (two) times daily.  200 tablet  3  . sildenafil (VIAGRA) 100 MG tablet Take 1 tablet (100 mg total) by mouth as needed for erectile dysfunction.  10 tablet  11  . simvastatin (ZOCOR) 40 MG tablet Take 1 tablet (40 mg total) by mouth at bedtime.  100 tablet  3  . potassium chloride SA (K-DUR,KLOR-CON) 20 MEQ tablet       . DISCONTD:  ramipril (ALTACE) 10 MG capsule         Past Medical History  Diagnosis Date  . Hypertension   . Hyperlipidemia   . ED (erectile dysfunction)   . CAD (coronary artery disease)     2002 stenting of the RCA,, 2006 Stenting of diag with DES, PTCA of Circ, 50% LAD  . Barrett's esophagus   . Diverticulosis   . GERD (gastroesophageal reflux disease)   . Hemorrhoids   . Duodenal ulcer   . DJD (degenerative joint disease)     Past Surgical History  Procedure Date  . Spinal fusion   . Spinal fusion   . Spine surgery     cervical    ROS As stated in the HPI and negative for all other systems.  PHYSICAL EXAM BP 113/71  Pulse 59  Ht 5\' 5"  (1.651 m)  Wt 177 lb (80.287 kg)  BMI 29.45 kg/m2 GENERAL:  Well appearing HEENT:  Pupils equal round and reactive, fundi not visualized, oral mucosa unremarkable NECK:  No jugular venous distention, waveform within normal limits, carotid upstroke brisk and symmetric, no bruits, no thyromegaly LYMPHATICS:  No cervical, inguinal adenopathy LUNGS:  Clear to auscultation bilaterally BACK:  No CVA tenderness CHEST:  Unremarkable HEART:  PMI not displaced or sustained,S1 and S2 within normal limits, no S3, no S4, no clicks, no rubs, no murmurs ABD:  Flat, positive bowel sounds normal in frequency in pitch, no bruits, no rebound, no guarding, no midline pulsatile mass, no hepatomegaly, no splenomegaly EXT:  2 plus pulses throughout, no edema, no cyanosis no clubbing SKIN:  No rashes no nodules NEURO:  Cranial nerves II through XII grossly intact, motor grossly intact throughout PSYCH:  Cognitively intact, oriented to person place and time   EKG:  Sinus bradycardia, rate 50, axis within normal limits, and herbal limits,  ASSESSMENT AND PLAN

## 2010-11-26 NOTE — Assessment & Plan Note (Signed)
The blood pressure is at target. No change in medications is indicated. We will continue with therapeutic lifestyle changes (TLC).  

## 2010-11-26 NOTE — Patient Instructions (Signed)
Stop simvastatin Start Atorvastatin Having fasting lab work in 10 weeks (around the week of June 12th) Follow up with Dr Antoine Poche in one year

## 2010-11-26 NOTE — Assessment & Plan Note (Signed)
He is switched him to Lipitor given the interaction potential between diltiazem and 40 mg of simvastatin. He is given instructions to get lipid and liver profile in 10 weeks.

## 2010-11-26 NOTE — Assessment & Plan Note (Signed)
I reviewed with him his 2006 catheterization results. On both occasions when he had intervention he had symptoms with exertion. He exceeds that level of exertion now without any symptoms. He exercises routinely. Therefore, no further testing is suggested he will continue with risk reduction.

## 2010-12-20 ENCOUNTER — Encounter: Payer: Self-pay | Admitting: Internal Medicine

## 2010-12-20 ENCOUNTER — Ambulatory Visit (INDEPENDENT_AMBULATORY_CARE_PROVIDER_SITE_OTHER): Payer: Medicare Other | Admitting: Internal Medicine

## 2010-12-20 DIAGNOSIS — J069 Acute upper respiratory infection, unspecified: Secondary | ICD-10-CM

## 2010-12-20 MED ORDER — AMOXICILLIN 875 MG PO TABS: 875.0000 mg | ORAL_TABLET | Freq: Two times a day (BID) | ORAL | Status: AC

## 2010-12-20 NOTE — Progress Notes (Signed)
  Subjective:    Patient ID: [name], male    DOB: [birth year], 70 y.o.   MRN: [id number]  HPI Pt presents to clinic for evaluation of sore throat. Notes three-day history of left ear pain without drainage, nasal congestion, sore throat, chest congestion without cough or chest pain. Treated sore throat with tea and honey with improvement. Notes two recent sick exposures. No fever or chills. No other alleviating or exacerbating factors. No other complaints.  Reviewed past medical history, medications and allergies.    Review of Systems see history of present illness     Objective:   Physical Exam    Physical Exam  [nursing notereviewed. Constitutional:  appears well-developed and well-nourished. No distress.  HENT:  Head: Normocephalic and atraumatic.  Right Ear: Tympanic membrane, external ear and ear canal normal.  Left Ear: Tympanic membrane, external ear and ear canal normal.  Nose: Nose normal.  Mouth/Throat: Oropharynx is clear and moist. No oropharyngeal exudate.  Eyes: Conjunctivae are normal. No scleral icterus.  Neck: Neck supple.  Cardiovascular: Normal rate, regular rhythm and normal heart sounds.  Exam reveals no gallop and no friction rub.   No murmur heard. Pulmonary/Chest: Effort normal and breath sounds normal. No respiratory distress.  no wheezes.  no rales.  Lymphadenopathy:     no cervical adenopathy.  Neurological:  alert.  Skin: Skin is warm and dry.  not diaphoretic.      Assessment & Plan:

## 2010-12-20 NOTE — Assessment & Plan Note (Signed)
Suspect there are etiology more likely at this time. Continue symptomatic over-the-counter relief as needed. Prescription provided for amoxicillin x7 days to hold. Begin antibiotic improvement of symptoms over a total of 7-10 days. At that point followup if no improvement or worsening.

## 2011-01-07 NOTE — Assessment & Plan Note (Signed)
[facility]                            CARDIOLOGY OFFICE NOTE   Trig, Mcbryar ISSAM CARLYON                     MRN:          [id number]  DATE:05/13/2007                            DOB:          07-Jan-1941    PRIMARY:  Dr. Alonza Smoker.   REASON FOR PRESENTATION:  The patient with coronary disease.   HISTORY OF PRESENT ILLNESS:  The patient is 70 years old.  He is doing  well since I last saw him.  He has been active in walking and  exercising.  He gets no chest discomfort, neck or arm discomfort.  He  has had no palpitations, presyncope, or syncope.  He has had no PND or  orthopnea.   PAST MEDICAL HISTORY:  1. Coronary artery disease (March 2002, stenting of the right coronary      artery.  November 2006, 90% lesion of the first diagonal, 90%      stenosis of the left circumflex.  He had Taxus stenting of a      diagonal and PTCA of the circumflex.  He had 50-60% residual      stenosis at the LAD).  2. Hypertension.  3. Hyperlipidemia.   ALLERGIES:  None.   MEDICATIONS:  1. Protonix 40 mg b.i.d.  2. Hydrochlorothiazide 25 mg daily.  3. Klor-Con 20 mEq daily.  4. Atenolol 50 mg b.i.d.  5. Cartia 180 mg q.a.m.  6. Altace 10 mg daily.  7. Plavix 75 mg daily.  8. Aspirin 81 mg daily.  9. Multivitamin.  10.Simvastatin 40 mg daily.   REVIEW OF SYSTEMS:  As stated in the HPI and otherwise negative for  other systems.   PHYSICAL EXAMINATION:  The patient is in no distress.  Blood pressure 112/82, heart rate 60 and regular, weight 173 pounds,  body mass index 27.  HEENT:  Eyelids unremarkable; pupils equal, round, and reactive to  light; fundi not visualized; oral mucosa unremarkable.  NECK:  No jugular venous distention at 45 degrees.  Carotid upstroke  brisk and symmetric.  No bruits, thyromegaly.  LYMPHATICS:  No adenopathy.  LUNGS:  Clear to auscultation bilaterally.  BACK:  No costovertebral angle tenderness.  CHEST:  Unremarkable.  HEART:   PMI not displaced or sustained.  S1 and S2 within normal limits  with no S3, no S4; no clicks, rub, murmurs.  ABDOMEN:  Flat, positive bowel sounds, normal in frequency and pitch.  No bruits, rebound, guarding in the midline, pulsatile mass, or  organomegaly.  SKIN:  No rashes.  EXTREMITIES:  2+ pulses, no edema.   EKG sinus rhythm, rate 60, axis within normal limits, intervals within  normal limits, no acute ST wave changes.   ASSESSMENT/PLAN:  1. Coronary disease.  The patient is having no symptoms related to      this.  No further cardiovascular testing is suggested.  2. Hypertension.  Blood pressure is well controlled, and we will      continue the medications as listed.  3. Dyslipidemia.  He will get a lipid profile and liver enzymes today.  4. Followup.  I will see  the patient back in 18 months or so.     Rollene Rotunda, MD, [facility]  Electronically Signed    JH/MedQ  DD: 05/13/2007  DT: 05/13/2007  Job #: [id number]

## 2011-01-07 NOTE — Assessment & Plan Note (Signed)
[facility]                            CARDIOLOGY OFFICE NOTE   [name], [name]                     MRN:          [id number]  DATE:09/15/2008                            DOB:          [birth year]    PRIMARY CARE PHYSICIAN:  Tinnie Gens A. Tawanna Cooler, MD   REASON FOR PRESENTATION:  Evaluate the patient with coronary artery  disease.   HISTORY OF PRESENT ILLNESS:  The patient is now 70 years old.  He has  done quite well since I last saw him.  He is to have some injections for  back pain.  He needs to have clearance to come off his Plavix.  He had a  Taxus stent placed in 2006.  He remains quite active.  He has no chest  discomfort, neck or arm discomfort.  He has no palpitation, presyncope,  or syncope.  He has no PND or orthopnea.  He exercises routinely on an  elliptical trainer without having any of these symptoms.  He does have  slight limitations with back pain.   PAST MEDICAL HISTORY:  Coronary artery disease (March 2002 stenting of  the right coronary artery; November 2006, 90% lesion of the first  diagonal, 90% stenosis of the left circumflex.  He had Taxus stenting of  the diagonal and PTCA of the circumflex.  He had 50-60% residual  stenosis of the LAD), hypertension, hyperlipidemia.   ALLERGIES:  None.   MEDICATIONS:  1. Hydrochlorothiazide 25 mg daily.  2. Klor-Con.  3. Atenolol 50 mg b.i.d.  4. Cartia XT 180 mg daily.  5. Plavix 75 mg daily.  6. Aspirin 81 mg daily.  7. Multivitamin.  8. Zocor 40 mg daily.  9. Red Omega.  10.Omeprazole 40 mg daily.  11.Altace 10 mg b.i.d.   REVIEW OF SYSTEMS:  As stated in the HPI and otherwise negative for  other systems.   PHYSICAL EXAMINATION:  GENERAL:  The patient is in no distress.  VITAL SIGNS:  Blood pressure 118/78 and heart rate 62 and regular.  HEENT:  Eyelids unremarkable.  Pupils are equal, round, and reactive to  light.  Fundi not well visualized.  Oral mucosa unremarkable.  NECK:   No jugular venous distention at 45 degrees, carotid upstroke  brisk and symmetric, no bruits, no thyromegaly.  LYMPHATICS:  No cervical, axillary, inguinal adenopathy.  LUNGS:  Clear to auscultation bilaterally.  BACK:  No costovertebral angle tenderness.  CHEST:  Unremarkable.  HEART:  PMI not displaced or sustained, S1 and S2 within normal limits,  no S3, no S4, no clicks, no rubs, no murmurs.  ABDOMEN:  Mildly obese; positive bowel sounds, normal in frequency and  pitch; no bruits, no rebound, no guarding, no midline pulsatile mass; no  hepatomegaly, no splenomegaly.  SKIN:  No rashes, no nodules.  EXTREMITIES:  2+ pulses throughout, no edema, no cyanosis, no clubbing.  NEURO:  Oriented to person, place, and time; cranial nerves II through  XII grossly intact; motor grossly intact.   EKG, sinus rhythm, rate 62, axis within normal limits, no acute ST-wave  changes.   ASSESSMENT AND  PLAN:  1. Coronary disease.  The patient is having no ongoing symptoms.  He      is quite active.  No further cardiovascular testing is suggested.      We will continue with the meds as listed.  2. Dyslipidemia.  I reviewed his labs.  His LDL was 102.  His HDL was      41.5.  This is acceptable and he will remain on the meds as listed.  3. Back pain.  He is due to have injections on his back.  He can come      off his Plavix for 5 days or as needed prior to this.  He should      resume it when it is felt safe from a procedural standpoint.  4. Followup.  I will see him back in 1 year or sooner if needed.     Rollene Rotunda, MD, [facility]  Electronically Signed    JH/MedQ  DD: 09/15/2008  DT: 09/16/2008  Job #: [id number]   cc:   Loraine Leriche L. Vear Clock, M.D.  Jeffrey A. Tawanna Cooler, MD

## 2011-01-07 NOTE — Assessment & Plan Note (Signed)
[name]                         GASTROENTEROLOGY OFFICE NOTE   NAME:[name], [name]                     MRN:          [id number]  DATE:11/18/2007                            DOB:          Jan 09, 1941    [name] returns for followup of GERD with Barrett's esophagus.  One  biopsy, performed at his last endoscopy in April of 2008, showed focal  atypia, consistent with low-grade dysplasia.  The other biopsies did not  reveal any evidence of atypia or dysplasia.  He has no gastrointestinal  complaints.  He remains on Protonix daily with excellent control of all  reflux symptoms.  He has no dysphagia, odynophagia, chest pain or weight-  loss.  He is maintained on Plavix for a history of coronary artery  disease and is status post a Taxus stent.   CURRENT MEDICATIONS:  Listed on the chart, updated and reviewed.   MEDICATION ALLERGIES:  None known.   PHYSICAL EXAM:  No acute distress.  Weight 180.4 pounds, blood pressure is 120/78, pulse 68 and regular.  HEENT EXAM:  Anicteric sclerae.  Oropharynx clear.  CHEST:  Clear to auscultation bilaterally.  CARDIAC:  Regular rate and rhythm without murmurs.  ABDOMEN:  Soft, nontender with normoactive bowel sounds.   ASSESSMENT AND PLAN:  1. GERD with Barrett's esophagus and atypia versus low-grade      dysplasia.  Maintain long-term antireflux measures and a proton      pump inhibitor on a daily basis indefinitely.  Risks, benefits, and      alternatives to upper endoscopy, if possible, biopsy performed off      Plavix, discussed with the patient and he consents to proceed.      This will be scheduled electively.  We will obtain clearance from      Dr. Antoine Poche, his cardiologist, for a temporary hold on Plavix with      the plan to resume Plavix on the day of the procedure.  He will      remain on aspirin 81 mg a day throughout the time of the upper      endoscopy with biopsies.  2. Colorectal cancer  screening.  Recall screening colonoscopy      recommended in December 2014.     Venita Lick. Russella Dar, MD, [facility]  Electronically Signed    MTS/MedQ  DD: 11/19/2007  DT: 11/19/2007  Job #: [id number]   cc:   Rollene Rotunda, MD, [facility]

## 2011-01-10 NOTE — Cardiovascular Report (Signed)
NAME:  SANI, MADARIAGA NO.:  [id number]   MEDICAL RECORD NO.:  [id number]          PATIENT TYPE:  OIB   LOCATION:  6525                         FACILITY:  [facility]   PHYSICIAN:  Arvilla Meres, M.D. LHCDATE OF BIRTH:  1940/12/16   DATE OF PROCEDURE:  07/11/2005  DATE OF DISCHARGE:                              CARDIAC CATHETERIZATION   PRIMARY CARE PHYSICIAN:  Dr. Kelle Darting.   CARDIOLOGIST:  Dr. Rollene Rotunda.   PATIENT IDENTIFICATION:  [name] is a very pleasant 70 year old male  with a history of coronary artery disease status post PTCA and stenting of  the mid-right coronary artery in March 2002 with a velocity stent. Since  that time, he has been doing quite well, exercising on a regular basis. This  past week he noticed some severe exertional chest discomfort and shortness  of breath which resolved promptly upon resting. He was seen in the office  and referred for diagnostic catheterization.   PROCEDURES PERFORMED:  1.  Selective coronary angiography.  2.  Left heart cath.  3.  Left ventriculogram.   DESCRIPTION OF PROCEDURE:  The risks and benefits catheterization were  explained to [name]; consent was signed and placed on the chart. A 6-  [place] arterial sheath was placed in the right femoral artery using a  modified Seldinger technique. Standard preformed Judkins catheters including  JL-4, JR-4 and angled pigtail were used for the procedure. All catheter  changes were made over wire. There were no apparent complications.   Central aortic pressure was 135/71 with a mean of 101. LV pressure was 111/7  with an LVEDP of 26. There was no gradient on pullback across the aortic  valve.   Left main was normal.   LAD was a long vessel wrapping the apex. It gave off a large branching first  diagonal and a small second diagonal. In the proximal LAD, there was some  mild calcification with a tubular 50-60% stenosis that crossed over the  first  diagonal. There was a 99% very distal stenosis.   Left circumflex was a small vessel. It gave off a small OM-1. There was a  99% lesion in the mid-left circumflex before the OM-1. This was a 1.75-2.0  mm vessel. There was a larger ramus branch which was branching. There was a  99% stenosis in the body of the ramus.   Right coronary artery was a large dominant vessel. It gave off an RV branch  as well as a moderate-sized PDA and three small PL's. There was evidence of  previously placed stent in midportion just after the RV branch. In the  proximal portion of the RCA, there was a 20% lesion followed by 30% lesion  in the mid-section just before the stent. The stent was widely patent. After  the stent, there was a 40% stenosis. There was minor luminal irregularity in  the PDA as well as the distal RCA.   A left ventriculogram done in the RAO approach showed an EF of 60%. There  was no MR or wall motion abnormalities.   ASSESSMENT:  1.  Significant two-vessel  coronary disease with high-grade lesions in the      ramus, left circumflex and distal left anterior descending artery. There      was also a borderline lesion in the proximal left anterior descending      artery.  2.  The right coronary artery stent was widely patent.  3.  Normal left ventricular function with mildly increased filling      pressures.   PLAN:  I have reviewed the films with Dr. Juanda Chance. The plan will be for PTCA  and stenting of the ramus branch and a balloon angioplasty of the small left  circumflex. He will obviously need aggressive risk factor modification and  would consider an outpatient functional study to evaluate the significance  of the proximal LAD lesion.      Arvilla Meres, M.D. [facility]  Electronically Signed     DB/MEDQ  D:  07/11/2005  T:  07/12/2005  Job:  [id number]   cc:   Tinnie Gens A. Tawanna Cooler, M.D. [facility]  [address] [place]  [place] [address]   Rollene Rotunda, M.D.  [address]   [address]  [place]  [place] [address]

## 2011-01-10 NOTE — Assessment & Plan Note (Signed)
[facility]                              CARDIOLOGY OFFICE NOTE   [name], [name]                     MRN:          [id number]  DATE:04/30/2006                            DOB:          [birth year]    PRIMARY CARE PHYSICIAN:  Kelle Darting, M.D.   REASON FOR PRESENTATION:  Patient with coronary disease.   HISTORY OF PRESENT ILLNESS:  Patient pleasant 70 year old gentleman with  coronary disease as described as below.  He has done well since I last saw  him.  He has had some lightheadedness when standing.  This is episodic and  may last 8-10 seconds.  He has had no syncope with this.  It does not happen  every time.  He has no chest pain or shortness of breath.  He has no PND or  orthopnea.  He has had no palpitations.   PAST MEDICAL HISTORY:  Coronary artery disease (March 2002 extending to the  right coronary artery.  November 2006 90% lesion first diagonal, 90%  stenosis of the left circumflex.  He had Taxus stenting of the diagonal and  PTCA of the circumflex.  He had 50-60% stenosis of the LAD residual),  hypertension, hyperlipidemia.   ALLERGIES:  None.   MEDICATIONS:  Indomethacin, hydrochlorothiazide 25 mg, Klor-con 20 mEq,  Atenolol 50 mg b.i.d., __________ 180 mg, Altace 10 mg b.i.d., Vytorin 40/10  q. day, Plavix 75 mg, NitroQuick, Centrum Silver, aspirin.   REVIEW OF SYSTEMS:  As stated in the HPI and otherwise negative for other  systems.   PHYSICAL EXAMINATION:  The patient is in no distress.  Blood pressure  103/71, heart rate 63 and regular, weight 177 pounds.  Neck no jugular  venous distention, wave form within normal limits, cardiac upstroke brisk  and symmetrical, no bruits, thyromegaly.  Lymphatics were normal.  Lungs  clear to auscultation bilaterally.  Back no costovertebral angle tenderness.  Chest unremarkable.  Heart PMI not displaced or sustained, S1/S2 within  normal limits, no S3, no S4, no murmurs.  Abdomen  flat, positive bowel  sounds, normal in frequency and pitch, no bruits, rebound, guarding, no  midline pulsatile mass, no mass, no organomegaly. Skin no rashes, no  __________.  Extremities 2+ pulses, no edema.   ASSESSMENT/PLAN:  1. Coronary disease, continue medications with the exceptions below.  He      needs to continue the Plavix indefinitely.  He will continue with risk      reduction.  2. Orthostatic hypotension.  The patient has some symptoms consistent with      orthostasis.  I am going to cut his hydrochlorothiazide back to 12.5 mg      q. day and change his Altace to 10 mg once a day.  Continue other      medications as listed.  3. Dyslipidemia.  The goal is now LDL less than 70 and HDL in the 50's.      He had an excellent      response to Vytorin.  He will continue with this and have it followed  by Dr. Tawanna Cooler.  4. Follow up.  Will see him back in 12 months or sooner if needed.                               Rollene Rotunda, MD, [facility]    JH/MedQ  DD:  04/30/2006  DT:  04/30/2006  Job #:  [id number]   cc:   Tinnie Gens A. Tawanna Cooler, MD

## 2011-01-10 NOTE — Discharge Summary (Signed)
NAME:  [name], [name]              ACCOUNT NO.:  [id number]   MEDICAL RECORD NO.:  [id number]          PATIENT TYPE:  INP   LOCATION:  5703                         FACILITY:  [facility]   PHYSICIAN:  Sandford Craze, NP DATE OF BIRTH:  08-29-1940   DATE OF ADMISSION:  05/08/2006  DATE OF DISCHARGE:  05/11/2006                                 DISCHARGE SUMMARY   DISCHARGE DIAGNOSES:  1. Upper GI bleed status post endoscopy May 10, 2006, performed by      Dr. Rob Bunting which revealed Barrett's esophagus.  2. Gastroesophageal reflux disease.  3. History of coronary artery disease status post drug-eluting stent      December 2006.  4. History of arthritis followed by Dr. Thyra Breed in pain clinic.  5. Anemia secondary to upper GI bleed status post 2 units packet blood      cells this hospitalization.   HISTORY OF PRESENT ILLNESS:  [name] is a 70 year old male who has been  on chronic Indocin to treat osteoarthritis.  He noted black colored stools 2-  3 days in a row, which started approximately 4 days prior to admission. This  was accompanied by dyspnea on exertion, and [name] presents to the  emergency department.  The patient was admitted for further evaluation and  treatment.   PAST MEDICAL HISTORY:  1. Degenerative joint disease.  2. Status post C-spine fusion in the past.  3. Hypertension.  4. Dyslipidemia.  5. History of erectile dysfunction.  6. Coronary artery disease status post MI 2002 with stent placement at      that time and recent drug-eluting stent December 2006.  7. History of diverticular disease.   COURSE OF HOSPITALIZATION:  #1 - UPPER GI BLEED SECONDARY TO NSAID AND  ASPIRIN USE.  [name] was admitted and was noted on admission to have a  hemoglobin of 8.3.  As the patient was symptomatic he was transfused 2 units  packed red blood cells during this hospitalization.  Hemoglobin at time of  discharge is 10.0.   [name] was seen in  consultation by [facility] and underwent an  endoscopy which was performed on April 30, 2006.  Endoscopy noted  Barrett's esophagus, and it was recommended that aspirin and Plavix be held  for seven additional days and then be restarted.  Also of note, the patient  was evaluated by [facility] during this admission due to drug-  eluting stent which was placed November 2006, and recommendations on aspirin  and Plavix management.  It was felt that since he had greater than 6 months  and stents were placed, the risk of thrombosis was relatively small but  still existed.  It was recommended that Plavix be resumed pending results of  the EGD and that the patient be continued on his beta blocker.  It was also  recommended that his antihypertensives and Statins be resumed once his BP  and hemoglobin are stable.   It was felt that the patient should be continued on twice daily Protonix  until further directed by Dr. Russella Dar, and that aspirin be changed  to 81 mg  daily.   #2 - CHRONIC PAIN.  It was recommended that Indocin be discontinued and that  the patient may need to be considered for a non-NSAID pain medication.  This  will need to be deferred to pain clinic.   MEDICATIONS AT DISCHARGE:  1. Protonix 40 mg p.o. b.i.d.  2. aspirin 81 mg p.o. daily to start on September 24.  3. Plavix 75 mg p.o. daily to start September 24.  4. Atenolol 50 mg p.o. b.i.d.  5. HCTZ 25 mg p.o. daily.  6. Cardiac XT 180 mg p.o. daily.  7. Altace 10 mg p.o. b.i.d.  8. Vytorin 40/10 one tablet p.o. daily.  9. NitroQuick 0.4 mg as needed sublingual.  10.Centrum Silver one tablet p.o. daily.  11.The patient is instructed to discontinue indomethacin.   1. The patient is instructed to hold HCTZ, Cardia and Altace until follow      up with Dr. Tawanna Cooler.   PERTINENT LABORATORY DATA:  At discharge hemoglobin 10, hematocrit 29.4,  hemoglobin A1c 6.0, BUN 40, creatinine 1.5.   DISPOSITION:  Plan to transfer  the patient to home.  Has no skilled needs at  this time.   FOLLOW UP:  The patient is instructed to follow up with Dr. Russella Dar on  Tuesday, October 9, 8:45 a.m.  He is also instructed to follow up with Dr.  Tawanna Cooler in the next 1-2 weeks.           ______________________________  Sandford Craze, NP     MO/MEDQ  D:  05/11/2006  T:  05/12/2006  Job:  [id number]   cc:   Tinnie Gens A. Tawanna Cooler, MD  Kathrin Penner. Vear Clock, M.D.

## 2011-01-10 NOTE — Procedures (Signed)
[name]  Patient:    [name], [name]                       MRN: [id number] Proc. Date: 11/09/00 Adm. Date:  11/07/00 Attending:  Cecil Cranker, M.D. [facility] CC:         Evette Georges, M.D. [facility]  Rollene Rotunda, M.D. [facility] Cardiac Cath Lab.   Procedure Report  PROCEDURE:  Selective coronary angiography, left ventricular angiography, abdominal aortography via Judkins technique.  DISPOSITION:  The patient tolerated the procedure well.  INDICATIONS:  The patient is a 70 year old white married male with typical symptoms of unstable angina.  RESULTS:  PRESSURES: 1. LV systolic 111, diastolic 15. 2. ______ systolic 105, diastolic 54.  ANGIOGRAPHY: 1. The left main coronary is normal. 2. The left anterior descending reveals 50% segmental stenosis at the level of    the first septal.  A second small diagonal had approximately 70% ostial    lesion. 3. The circumflex was a small nondominant vessel with a 30% lesion. 4. The right coronary artery was dominant.  There was a 90% focal lesion in    the mid RCA and another 30% lesion in the mid distal RCA. 5. Left ventricle is normal. 6. Abdominal aortogram is normal.  SUMMARY: 1. Normal left main. 2. 50% segmental LAD, 70% ostial, small second diagonal. 3. 30% lesion in nondominant circumflex. 4. 30% lesion at the ostium probably spasm, 90% focal lesion in the mid RCA,    and then followed by another lesion of 30-50%. 5. The left ventricle is normal. 6. The abdominal aortogram is normal.  SUMMARY:  The study was reviewed with Dr. Gerri Spore.  It was felt that a first stage intervention of the RCA was indicated.DD:  11/09/00 TD:  11/09/00 Job: [id number]

## 2011-01-10 NOTE — Cardiovascular Report (Signed)
NAME:  CEEJAY, KEGLEY NO.:  [id number]   MEDICAL RECORD NO.:  [id number]          PATIENT TYPE:  OIB   LOCATION:  6525                         FACILITY:  [facility]   PHYSICIAN:  Charlies Constable, M.D. [facility] DATE OF BIRTH:  21-May-1941   DATE OF PROCEDURE:  07/11/2005  DATE OF DISCHARGE:  07/12/2005                              CARDIAC CATHETERIZATION   CLINICAL HISTORY:  Mr. Magos is 70 years old and has had a previous stent  placed to the right coronary artery a number of years ago.  He recently  developed stable angina and was brought in for evaluation with angiography.  This was performed by Dr. Gala Romney and showed nonobstructive disease in the  right coronary artery with a patent stent in the mid right coronary artery.  There was a 90% to 95% stenosis in the large first diagonal branch of the  LAD and there was a 90% to 95% stenosis in the proximal circumflex artery  which was a very small vessel.  There is also disease in the LAD just after  the diagonal branch which was estimated at 60% to 70%.  There is also a very  distal lesion in the LAD estimated at 99%.  The LV function was normal.  We  made a decision to intervene on the large diagonal branch and the circumflex  artery.   PROCEDURE:  The procedure was performed using a 6-French CLS4 guiding  catheter with side holes.  The patient was given Angiomax bolus and  infusion.  He was enrolled in the Stilwell trial and was randomized to  Plavix versus Cangrelor.  We first passed a Prowater wire down the diagonal  branch without difficulty.  We predilated with a 2.25 x 20 mm Maverick  performing two inflations up to seven atmospheres for 30 seconds.  We then  deployed a 2.5 x 20 mm Taxus stent performing with this one inflation of  nine atmospheres for 30 seconds.  We post dilated with a 2.75 x 15 Quantum  Maverick performing two inflations up to 15 atmospheres for 30 seconds.   We then approached the AV circumflex  artery.  We navigated the Prowater wire  across the lesion.  We used a 2 x 20 mm Maverick balloon and performed three  inflations up to seven atmospheres for 30 seconds.  Repeat diagnostic  studies were then performed through the guiding catheter.  The patient  tolerated the procedure well and left the laboratory in satisfactory  condition.   RESULTS:  Initially, it was felt that the large diagonal branch was  estimated at 95%.  Following stenting, this improved to 0%.   Initially it was felt that the AV artery was 90%, following stenting this  improved to less than 20%.   CONCLUSION:  1.  Successful stenting of the large diagonal branch of the LAD with a Taxus      drug-eluting stent with improvement in narrowing from 95% to 0%.  2.  Successful PTCA of the proximal circumflex artery with improvement in      central narrowing from 90% to less than 20%.  DISPOSITION:  The patient will return to the post anesthesia unit for  further observation.  We will plan to evaluate the patient with an exercise  rest stress Cardiolite scan in about two weeks to assess the significance of  the LAD lesion.  He will have follow up with Dr. Rollene Rotunda after that.           ______________________________  Charlies Constable, M.D. [facility]     BB/MEDQ  D:  07/11/2005  T:  07/13/2005  Job:  [id number]   cc:   Arvilla Meres, M.D. [facility]  [employer]  [address]  [place]  [place] [address]   Rollene Rotunda, M.D.  [address]  [address]  [place]  [place] [address]

## 2011-01-10 NOTE — Discharge Summary (Signed)
NAMECHRISTPHER, [name]              ACCOUNT NO.:  [id number]   MEDICAL RECORD NO.:  [id number]          PATIENT TYPE:  OIB   LOCATION:  3010                         FACILITY:  [facility]   PHYSICIAN:  Stefani Dama, M.D.  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   MAJOR OPERATIONS:  Anterior cervical decompression C5-6, C6-7, C7-T1 and  arthrodesis with structure allograft and Arthrotec fixation on July 19, 2004   CONDITION ON DISCHARGE:  Improving.   HOSPITAL COURSE:  [name] is a 70 year old individual who had  significant spondylitic degeneration with weakness of his left arm.  He was  admitted to the hospital where he underwent a 3 level anterior decompression  arthrodesis.  Surgical drain was left in postoperatively.  This was removed  approximately 18 hours after the surgery.  His incision has remained clean  and dry and the patient is swallowing well and his motor function and  neurologic function appear to be improving and because he is doing well, he  is being discharged home with prescriptions for Percocet #30 without  refills, Valium 5 mg #10 without refills.  He will be seen in the office in  2 weeks' time for further followup.      [name]   HJE/MEDQ  D:  07/20/2004  T:  07/20/2004  Job:  [id number]

## 2011-01-10 NOTE — Consult Note (Signed)
NAME:  [name], SPILLERS NO.:  [id number]   MEDICAL RECORD NO.:  [id number]          PATIENT TYPE:  INP   LOCATION:  5703                         FACILITY:  [facility]   PHYSICIAN:  Rachael Fee, MD   DATE OF BIRTH:  Feb 24, 1941   DATE OF CONSULTATION:  DATE OF DISCHARGE:                                   CONSULTATION   REFERRING PHYSICIAN:  Titus Dubin. Alwyn Ren, MD,FACP,FCCP   REASON FOR CONSULTATION:  Melena.   HISTORY OF PRESENT ILLNESS:  Mr. Garrels is a very pleasant 70 year old man  who has been on Indocin 2 pills a day for many years to treat his  osteoarthritis.  He also takes aspirin and Plavix for recent coronary artery  disease.  About 3 to 4 days ago he noted black-colored stools, this happened  for 2 to 3 days in a row.  Around the same time he began having dyspnea on  exertion, some orthostatic symptoms, and he eventually presented to the  emergency room.  Workup revealed anemia with a hemoglobin of 8.3, a white  count of 12.6, coags were normal.  Complete metabolic profile was normal,  except for BUN 40 and a creatinine of 1.5.  He was hemodynamically stable,  he was transfused 2 units of packed red blood cells, was begun on IV PPI  twice daily.  His NSAID, aspirin, and Plavix were all held.  He feels much  better after the blood transfusion and has had no more melena.  His last  bowel movement was approximately 36 hours ago.  The nausea that he was  having, as well as some mild epigastric discomfort, have also both improved.   REVIEW OF SYSTEMS:  Notable for a stable weight, otherwise is essentially  normal.   PAST MEDICAL HISTORY:  Coronary artery disease with stents placed in 2002.  Hypertension, elevated cholesterol, history of erectile dysfunction,  osteoarthritis, status post colonoscopy in 2004 that showed diverticular  disease with no polyps.   OUTPATIENT MEDICATIONS:  1. Indocin 50 mg twice daily.  2. Aspirin 325 mg once daily.  3. Plavix  75 mg daily.  4. Lipitor.  5. KCl.  6. Atenolol.  7. Altace.  8. Vytorin.  9. Nitroglycerin.   ALLERGIES:  NO KNOWN DRUG ALLERGIES.   SOCIAL HISTORY:  Married, nonsmoker, rarely drinks.   FAMILY HISTORY:  Father with coronary artery disease.  No colon cancer or  colon polyps in the family.   PHYSICAL EXAMINATION:  VITAL SIGNS:  Temperature of 97.8, pulse 67, blood  pressure 141/74, 98% on room air.  CONSTITUTIONAL:  Generally well appearing.  NEUROLOGIC:  Alert and oriented x3.  EYES:  Extraocular movements are intact.  MOUTH:  Oropharynx moist, no lesions.  NECK:  Supple, no lymphadenopathy.  CARDIOVASCULAR:  Heart regular rate and rhythm.  LUNGS:  Clear to auscultation bilaterally.  ABDOMEN:  Soft, nontender, nondistended.  Normal bowel sounds.  EXTREMITIES:  No lower extremity edema.  SKIN:  No rashes or lesions on the visible extremities.   ASSESSMENT/PLAN:  A 70 year old man with upper gastrointestinal bleed,  likely not actively bleeding.  He has been on chronic NSAIDS and aspirin and I suspect that is the etiology  of his bleeding, by way of peptic ulcer disease.  He shows no signs of  active bleeding.  His last bowel movement was greater than 24 hours ago and  he is hemodynamically stable.  I will arrange for him to have an upper  endoscopy tomorrow morning.  It seems reasonable for him to eat more regular  meals and dinner prior to then.  He also needs to be on aspirin and Plavix  for cardiac reasons, but he may need find an alternative non-NSAID pain  medicine.  Either way, he should be on PPI twice daily for now and probably  once daily indefinitely.           ______________________________  Rachael Fee, MD     DPJ/MEDQ  D:  05/09/2006  T:  05/10/2006  Job:  [id number]   cc:   Titus Dubin. Alwyn Ren, MD,FACP,FCCP

## 2011-01-10 NOTE — Assessment & Plan Note (Signed)
[facility]                           GASTROENTEROLOGY OFFICE NOTE   NAME:Trombetta, DMETRIUS AMBS                     MRN:          [id number]  DATE:06/04/2006                            DOB:          04-14-1941    Mr. Alsop returns for followup after hospitalization for an upper GI bleed  secondary to a duodenal ulcer which was felt secondary to Indocin.  A 7 cm  segment of Barrett's esophagus was also noted on endoscopy and biopsy showed  glandular atypia indefinite for dysplasia and 2 other areas with reactive  changes.   Please review the history and physical exam, discharge summary and endoscopy  from dates May 08, 2006 through October 11, 2005, included in the  chart.   His discharge hemoglobin was 10.0.  He has no gastrointestinal complaints  whatsoever.  He notes no rectal bleeding, dysphagia, heartburn or abdominal  pain.   PAST MEDICAL HISTORY, PAST SURGICAL HISTORY:  Please review the history and  physical exams dated May 08, 2006.   MEDICATIONS:  Listed on the chart, updated and reviewed.   MEDICATION ALLERGIES:  None known.   PHYSICAL EXAMINATION:  No acute distress.  Height 5 feet 6 inches.  Weight 179.  Blood pressure 108/76.  Pulse 72 and  regular.  HEENT:  Anicteric sclerae, oropharynx clear.  CHEST:  Clear to auscultation bilaterally.  CARDIAC:  Regular rate and rhythm without murmurs.  ABDOMEN:  Soft, nontender, nondistended.  Normoactive bowel sounds.  No  palpable organomegaly, masses or hernias.   ASSESSMENT AND PLAN:  1. Duodenal ulcer secondary to Indocin.  He is advised to remain off all      NSAID and Cox 2 agents indefinitely.  He may use an 81 mg aspirin on a      daily basis if that is recommended by his cardiologist, along with a      proton pump inhibitor on a daily basis.  2. Barrett's esophagus with atypia, indeterminate for dysplasia and      reactive changes.  Maintain standard antireflux  measures.  Repeat      endoscopy in March 2008 for additional biopsies.  He will maintain      Protonix 40 mg p.o. b.i.d. until March and if his biopsies improve we      may change to daily proton pump inhibitor therapy at that time.      Barrett's esophagus was discussed in detail with the patient and      written literature was supplied on management      of Barrett's and gastroesophageal reflux disease.  3. Anemia secondary to blood loss.  Repeat CBC today.       Venita Lick. Russella Dar, MD, [facility] Graham      MTS/MedQ  DD:  06/04/2006  DT:  06/05/2006  Job #:  [id number]   cc:   Tinnie Gens A. Tawanna Cooler, MD

## 2011-01-10 NOTE — Consult Note (Signed)
NAME:  [name], WIEGAND NO.:  [id number]   MEDICAL RECORD NO.:  [id number]          PATIENT TYPE:  INP   LOCATION:  5703                         FACILITY:  [facility]   PHYSICIAN:  Gerrit Friends. Dietrich Pates, MD, FACCDATE OF BIRTH:  10-Oct-1940   DATE OF CONSULTATION:  05/09/2006  DATE OF DISCHARGE:                                   CONSULTATION   REFERRING PHYSICIAN:  Dr. Alwyn Ren.   PRIMARY CARE PHYSICIAN:  Dr. Tawanna Cooler.   PRIMARY CARDIOLOGIST:  Dr. Antoine Poche.   HISTORY OF PRESENT ILLNESS:  This is a 70 year old gentleman admitted to  hospital with melena and anemia.  Consultation is sought to assist in  management of cardiac medications, most notably antiplatelet agents.  Mr.  Feuerborn' cardiac history dates back to 2002 when he underwent stent  implantation in the right coronary artery.  He returned with angina in  [facility] 2006 and was found to have critical stenosis of a diagonal in the  mid circumflex, which was a small vessel.  A drug-eluting stent was placed  in the diagonal and balloon angioplasty performed in the mid circumflex.  Mr. Lepkowski has done fine since then with no cardiopulmonary symptoms.  Risk  factor control has been optimal.  He was just seen in the office 10 days  ago, at which time he appears to be doing fine.   He subsequently developed nausea and orthostatic lightheadedness.  Melena  was noted.  He was seen by his primary care physician, who arranged for  hospital admission.  Initial hemoglobin was 10.  After transfusion of 2  units of packed red blood cells, hemoglobin is still 10.  Mr. Hedgepath feels  fine following his initial day in hospital.   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  He has been treated with nonsteroidals  for this condition.  He also has had hypertension and hyperlipidemia that  have been well managed medically.  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  He has also suffered a fracture  of the left elbow and right wrist.   Family history and review of systems were updated and are unchanged except  as noted above.   PHYSICAL EXAMINATION:  GENERAL:  Pleasant gentleman with no pallor and in no  acute distress.  VITAL SIGNS:  The temperature is 99, blood pressure 125/75, heart rate 70  and regular, respirations 20, O2 saturation 99% on room air.  HEENT:  Anicteric sclerae; normal lids and conjunctivae.  NECK:  Anterior surgical scar; normal carotid upstrokes without bruits; no  jugular venous distension.  ENDOCRINE:  No thyromegaly.  LUNGS:  Clear.  CARDIAC:  Normal first and second heart sounds; normal PMI.  ABDOMEN:  Soft and nontender; no organomegaly.  No bruits.  EXTREMITIES:  No edema; normal distal pulses.  NEUROMUSCULAR:  Symmetric strength and tone; normal cranial nerves.   EKG:  Normal sinus rhythm; borderline first-degree AV block; within normal  limits.   IMPRESSION:  Mr. Rodriquez presents with a substantial upper  gastrointestinal  bleed, although he appears to be recovering quickly.  I would recommend  continuing to hold clopidogrel and aspirin until upper endoscopy has been  performed.  If he has gastritis or an ulcer that either does not require  therapy or can be treated effectively endoscopically, then it should be  reasonable to restart aspirin and clopidogrel almost immediately thereafter,  especially if we can dispense with treatment with nonsteroidals in the  future.  There is some risk of withholding aspirin and clopidogrel for a few  days, but the magnitude is small and doing so is warranted in the face of a  substantial upper gastrointestinal bleed.   We greatly appreciate this request for consultation and will be happy to  follow Mr. Farabee with you in hospital.      Gerrit Friends. Dietrich Pates, MD, [facility]  Electronically Signed     RMR/MEDQ  D:  05/09/2006  T:   05/11/2006  Job:  [id number]

## 2011-01-10 NOTE — Op Note (Signed)
NAMEDAQUON, GREENLEAF              ACCOUNT NO.:  [id number]   MEDICAL RECORD NO.:  [id number]          PATIENT TYPE:  OIB   LOCATION:  2856                         FACILITY:  [facility]   PHYSICIAN:  Stefani Dama, M.D.  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   PROCEDURE:  Anterior cervical decompression, C5-6, C6-7, and  C7-T1,  arthrodesis with structural allograft and Alphatek fixation.   SURGEON:  Stefani Dama, M.D.   FIRST ASSISTANT:  Hilda Lias, M.D.   ANESTHESIA:  General endotracheal.   INDICATIONS:  Mr. Vallee is a 70 year old individual who has had  significant neck, shoulder, and left arm pain with weakness in the left  upper extremity involving the biceps, triceps, the intrinsic muscles.  He  has dysesthesias in a C8 distribution.  He has been this way for a number of  months, and an MRI demonstrates that he had severe spondylitic compression  at C5-6, C6-7 being the worst levels, and C7-T1 also with foraminal  stenosis, particularly on the left.  He has been advised regarding surgical  decompression via an anterior approach and is now taken to the operating  room for this procedure.   PROCEDURE:  The patient was brought to the operating room and placed on the  table in supine position.  After the smooth induction of general  endotracheal anesthesia, he was placed in five pounds of Holter traction and  the neck was prepped with Duraprep and draped in a sterile fashion.  A  transverse incision was made along the base of the neck on the left side,  and this was carried down through the platysma.  The plane between the  sternocleidomastoid and the strap muscles was dissected  bluntly until the  prevertebral space was reached and the first identifiable disk space was  noted to be that of C5-6 on a localizing radiograph.  Large ventral  osteophytes were encountered in this region.  After stripping the longus  colli muscle and some hypertropic fascial tissue in this areas, osteophytes  were encountered in the ventral disk space, and these were removed with a  combination of curettes and rongeurs.  A high-speed air drill was then used  to open up the disk space at C5-6.  The disk space was noted to be markedly  sclerotic.  After some dissection into the disk space, a self-retaining disk  spreader could be placed into the wound.  This allowed for further opening  of the disk space, and then a small series of curettes were used to remove  the remainder of severely desiccated disk material from the end plates.  Once the posterior longitudinal ligament was reached, there was noted to be  large osteophyte from the inferior margin of the body of C5.  This was taken  down with a high-speed drill and a 2.3 mm  dissecting tool.  Dissection was  carried out to the lateral borders, where there was noted to be significant  hypertrophy of osteophytes from the uncinate process.  The uncinate process  was taken down with a 2 mm bur, first on the right side, then on the left,  ultimately decompressing the disk space.  The dissection was performed such  that the posterior longitudinal ligament was completely lifted and removed  and the lateral recesses were dissected free to view the C6 nerve roots  exiting the foramina quite freely.  Hemostasis in the soft tissues was  obtained and then the 7 mm standard-sized Trans graft was fashioned with the  ends of the graft being shaved smooth.  The graft was filled with the  patient's own bone from the bony dissection, and this was placed into the  interspace and counter sunk approximately 2 mm.  Attention was then turned  to C6-7, where a  similar procedure was carried out.  Again osteophytes were  identified from the inferior margin of the body of C6, and the uncinate spur  hypertrophy was noted to be significant.  Again a 7 mm Trans graft was  fashioned, then placed into the interspace and counter sunk approximately 2  mm.  At C7-T1 a large ventral osteophyte was encountered such that it gave  the appearance of a spondylolisthesis at C7-T1.  In this area the dissection  was carried down to the interspace and the posterior longitudinal ligament  was opened.  A small osteophyte was encountered from the inferior margin of  the body of C7 and also the superior margin of the body of T1.  Uncinate  hypertrophy was noted particularly on the left side, and this was dissected  down to free the nerve root on the left, and then a similar dissection was  carried out to free the nerve root on the right side. Once this was  completed, again a 7 mm Trans graft was placed into the interspace and  counter sunk approximately 2 mm.  The large ventral osteophyte which was  most prominent over C7 was taken down with a Leksell rongeur.  A 57 mm  Alphatek plate was then fitted to the ventral aspect of the vertebral  bodies.  This was secured initially with a 14 mm fixed screw, then fixed  screws of the 14 x 4 mm size were used at C5, C6, and C7.  However, at T1  variable-angled screws were placed.  This secured the plate to the anterior  vertebral bodies nicely.  Hemostasis was then checked carefully in the  prevertebral soft tissues.  Once this was adequately secured, the wound was  closed over a small Jackson-Pratt drain, which was brought out through a  separate stab incision.  Vicryl 3-0 was used to close the platysma and 3-0  Vicryl was used to close the subcuticular tissues.  Dermabond was placed on  the skin.  The patient tolerated the procedure well.  The blood loss was  estimated at about 100-150 mL.      [name]  HJE/MEDQ  D:   07/19/2004  T:  07/19/2004  Job:  [id number]

## 2011-01-10 NOTE — Cardiovascular Report (Signed)
[place]  Patient:    [name], [name]                     MRN: [id number] Proc. Date: 11/09/00 Adm. Date:  [id number] Attending:  Dolores Patty CC:         Evette Georges, M.D. [facility]  Rollene Rotunda, M.D. [facility]  Cardiac Catheterization Laboratory   Cardiac Catheterization  PROCEDURES PERFORMED:  Percutaneous transluminal coronary angioplasty with stent placement in the mid right coronary artery.  INDICATIONS:  Mr. Bozzo is a 70 year old male, admitted with unstable angina. Cardiac catheterization done earlier today by Dr. Corinda Gubler revealed the presence of a 90% stenosis in the distal portion of the mid right coronary artery.  After reviewing the images we opted to proceed with percutaneous intervention.  DESCRIPTION OF PROCEDURE:  A preexisting 6 French sheath in the right femoral artery was exchanged for a 7 [place] sheath.  Heparin and Integrilin were administered per protocol.  We used a 7 [place] guiding catheter with side holes and a Forte wire.  Direct stent placement was performed with a 3.0 x 13 mm Bx Velocity stent, which was deployed at a pressure of 16 atmospheres.  Final angiographic images revealed patency of the right coronary artery with 0% residual stenosis at the stent site and TIMI-3 flow.  COMPLICATIONS:  None.  RESULTS:  Successful stent placement in the mid right coronary artery reducing a discrete 90% stenosis to 0% residual with TIMI-3 flow.  PLAN:  Integrilin will be continued for 18 hours.  Plavix will be administered for four weeks. DD:  11/09/00 TD:  11/10/00 Job: [id number]

## 2011-01-10 NOTE — Discharge Summary (Signed)
[facility]  Patient:    [name], [name]                     MRN: [id number] Adm. Date:  [id number] Attending:  Dolores Patty CC:         Rollene Rotunda, M.D. [facility]  Evette Georges, M.D. [facility]   Discharge Summary  ADMITTING DIAGNOSES:  1. Atypical chest pain with shortness of breath and diaphoresis with an     elevated CPK and normal troponin.  2. Hyperglycemia.  3. Hypertension on hydrochlorothiazide.  4. Arthritis.  DISCHARGE DIAGNOSES:  1. Coronary artery disease status post balloon angioplasty and stent of the     right coronary artery (please see catheterization report).  2. Hyperglycemia, resolved.  3. Hypertension, controlled.  4. Arthralgias, controlled.  HISTORY OF PRESENT ILLNESS:  Please see the history and physical dated 11/07/00 for complete details.  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  He had several episodes over the three days prior to admission.  Significantly, his father had myocardial infarction and brother had myocardial infarction at 8.  He was on atenolol 50 mg a day, hydrochlorothiazide 50 mg daily, baby aspirin, Lipitor, 10 mg and Indocin twice a day.  Blood pressure initially was 177/105 in the emergency room.  It fell to 118/82.  Glucose was 202 which was nonfasting.  Hematocrit was 40.4.  Total CPK was 478, MB 4 and troponin of 0.01.  The EKG showed variable but nonspecific T-wave abnormalities.  HOSPITAL COURSE:  He was admitted with polarimetric monitoring and heparin therapy.  He was seen in consultation with Dr. Rollene Rotunda.  He was taken to catheterization emergently November 09, 2000.  At that time,  a 50% proximal LAD and 70% diagonal lesion with a small circumflex.  There was a 90% mid right coronary artery lesion which was ballooned and  stented.  Postoperatively, he did experience bradycardia when his atenolol was raised from 50 mg twice a day to 75 mg twice a day.  The cardiologist felt that he could be discharged on atenolol 50 mg b.i.d. as he was asymptomatic.  Outpatient cardiac rehabilitation will be ordered.  It is planned for him to have an outpatient lipid profile.  His hemoglobin A1c was 5.4 in the absence of diabetes.  His lipid profile revealed a total cholesterol of 183, triglycerides of 146, HDL of 48 and LDL of 106.  These were at goal with the exception of LDL which should be less than 100.  The dietary information and Dr. Ludger Nutting TPRHD program for reversing heart disease and Dr. Cathi Roan, Drink and Be Healthy were recommended to him. Additionally, dietary information, Simply the Best which is Weight Watchers 250 best recipes was also recommended.  The third text will require avoidance of excess salt because of his hypertension.  DISCHARGE STATUS:  Improved.  PROGNOSIS:  His prognosis depends on control of risk factors.  DISCHARGE MEDICATIONS:  1. Protonix 40 mg daily.  2. He was encouraged to take the Celebrex 100 mg b.i.d. as needed in     place of Unasyn.  3. Plavix 75 mg daily.  4. Atenolol 50 mg twice a day.  5. Enteric coated aspirin 325 mg daily.  6. He will continue his Lipitor at the present dose.  7. The hydrochlorothiazide will be discontinued because of the  hyperglycemia.  8. He will follow up with Dr. Antoine Poche as scheduled.  9. As stated, he will be in cardiac rehabilitation. 10. He will be seen by Dr. Tawanna Cooler within the next three to four weeks. DD:  11/10/00 TD:  11/11/00 Job: [id number]

## 2011-01-10 NOTE — Assessment & Plan Note (Signed)
[facility]                            CARDIOLOGY OFFICE NOTE   [name], [name] [name]                     MRN:          [id number]  DATE:10/26/2006                            DOB:          02/17/1941    PRIMARY CARE PHYSICIAN:  Dr. Alonza Smoker.   REASON FOR PRESENTATION:  Patient with coronary disease.   HISTORY OF PRESENT ILLNESS:  Patient is a pleasant 70 year old gentleman  with coronary disease as described below.  He has done well since I last  saw him.  He did have an anemia with GI bleed and had to stop his  Indocin.  He was hospitalized with this.  He had no cardiac problems.  He says he exercises.  He denies any chest discomfort, neck discomfort,  arm discomfort, activity-induced nausea, vomiting, episodes of  diaphoresis.  He has had no palpitations, pre-syncope or syncope.   PAST MEDICAL HISTORY:  Coronary artery disease (March 2002), stenting of  the right coronary artery.  November 2006, 90% lesion of the first  diagonal, 90% stenosis of the left circumflex.  He had Taxus stenting of  the diagonal, PTCA of the circumflex.  He had a 50-60% stenosis of the  LAD residual), hypertension, hyperlipidemia.   ALLERGIES:  NONE.   MEDICATIONS:  1. Protonix 40 mg b.i.d.  2. Hydrochlorothiazide 25 mg daily.  3. Klor-Con 20 mEq daily.  4. Atenolol 50 mg b.i.d.  5. Cartia XD 180 mg q.a.m.  6. Altace 10 mg daily.  7. Vytorin 10/40 q.h.s.  8. Plavix 75 mg q.a.m.  9. Aspirin 81 mg daily.  10.Multivitamin.   REVIEW OF SYSTEMS:  Is as stated in the HPI.  Otherwise negative for  other systems.   PHYSICAL EXAMINATION:  GENERAL:  Patient is in no distress.  VITAL SIGNS:  Blood pressure 119/79, heart rate 55 and regular, weight  175 pounds, body mass index 27.  HEENT:  Eyes unremarkable, pupils equal, round, reactive to light.  Fundi not visualized.  NECK:  No jugular venous distention, 45 degrees, carotid upstrokes brisk  and symmetrical, no  bruits, no thyromegaly.  LYMPHATICS:  No cervical, axillary or inguinal adenopathy.  LUNGS:  Clear to auscultation.  BACK:  No costovertebral angle tenderness.  CHEST:  Unremarkable.  HEART:  PMI not displaced or sustained, S1 and S2 within normal limits.  No S3, no S4, no clicks, no rubs, no murmurs heard.  ABDOMEN:  Flat, positive bowel sounds, normal in frequency and pitch, no  bruits, no rebound, no guarding, no midline pulsatile masses, no  hepatomegaly, no splenomegaly.  SKIN:  No rashes, no nausea.  EXTREMITIES:  2+ pulses throughout, no edema, no cyanosis or clubbing.  NEURO:  Grossly intact.   EKG:  Sinus bradycardia, rate 55.  Axis within normal limits, intervals  within normal limits, no acute ST-T wave changes.   ASSESSMENT/PLAN:  1. Coronary disease.  Patient is having no symptoms.  He is practicing      secondary risk reduction.  He has an excellent lipid profile.  His      blood pressure is well controlled.  At this point, no further      cardiovascular testing is suggested.   Of note, he would be able to come off his Plavix as needed for planned  procedures, including an upcoming colonoscopy.   FOLLOWUP:  Patient will come back in one year or sooner if needed.     Rollene Rotunda, MD, [facility]  Electronically Signed    JH/MedQ  DD: 10/26/2006  DT: 10/26/2006  Job #: [id number]   cc:   Tinnie Gens A. Tawanna Cooler, MD

## 2011-01-10 NOTE — H&P (Signed)
[name], [name]              ACCOUNT NO.:  [id number]   MEDICAL RECORD NO.:  [id number]          PATIENT TYPE:  OIB   LOCATION:  3010                         FACILITY:  [facility]   PHYSICIAN:  Stefani Dama, M.D.  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   HISTORY OF PRESENT ILLNESS:  [name] is a 70 year old individual  who has had significant, progressive pain and weakness in the left upper  extremity.  He was not aware of the weakness but he noted that he had pain  and dysesthesias primarily into the ulnar aspect of the forearm and hand.  This was worked up with an MRI of the cervical spine which demonstrated the  patient had severe spondylitic disease with cord compression at C5-6, C6-7  and to a lesser extent at C7-T1.   When seen for examination on 11/23, the patient exhibited weakness in the  deltoid, biceps, triceps and grip on that left side.  His deltoid appeared  to be in good function.  He had severe, spondylitic disease and because of  the findings on the MRI, he was advised regarding surgical decompression and  arthrodesis from C5-6 to C6-7 and C7-T1.  The patient was admitted for this  purpose.   PAST MEDICAL HISTORY:  Patient's general health has been quite good.  He  reports a little bit of hypertension.  Otherwise, he has had generally good  health.   CURRENT MEDICATIONS:  1.  Indomethacin 50 mg twice daily.  2.  Hydrochlorothiazide 25 mg a day.  3.  Klor-con for potassium.  4.  Atenolol.  5.  Norvasc.  6.  Altace.  7.  Centrum Silver.  8.  An aspirin a day.   ALLERGIES:  He notes no allergies to medications.   REVIEW OF SYSTEMS:  Notable only for the items in the history of present  illness, plus some generalized osteoarthritis.   PHYSICAL EXAMINATION:  VITAL  SIGNS:  Blood pressure 140/86, heart rate 66  and regular, respirations 16 and unlabored.  GENERAL:  Alert, oriented, cooperative individual in no overt distress.  HEENT:  Normal.  NECK:  No masses, no bruits are heard.  Range of motion is limited to only  60 degrees to the left, 45 degrees to the right. He flexes and extends about  80% of normal.  LUNGS:  Clear to auscultation.  CARDIOVASCULAR:  Regular rate and rhythm.  No murmurs are heard.  ABDOMEN:  Soft, protuberant, bowel sounds are positive, no masses are  palpable.  MUSCULOSKELETAL:  Motor strength in the upper extremities reveals that the  deltoids are 5/5 bilaterally, biceps on the left is 4/5, triceps on the left  is 4/5. The intrinsics are 4/5. Grip strength is decreased to 4/5.  Tone and  bulk appear normal.  Reflexes are depressed in the biceps and triceps on the  left, 1+ on the biceps and triceps on the left and  2+ in the patellae,  absent in the Achilles.  Babinskis are downgoing.  Station and gait are  normal.  EXTREMITIES:  No clubbing, cyanosis or edema.  NEUROLOGIC:  Cranial nerve examination reveals pupils are 4 mm, brisk,  reactive to light and accommodation. Extraocular movements full. Face is  symmetric.  Tongue and uvula are midline.  Sclerae and conjunctivae are  clear.   IMPRESSION:  The patient has evidence of severe spondylitic myelopathy at C5-  6, C6-7 and C7-T1.  He is now being admitted to undergo surgical  decompression at those levels.      [name]   HJE/MEDQ  D:  07/20/2004  T:  07/20/2004  Job:  [id number]

## 2011-01-10 NOTE — Discharge Summary (Signed)
NAMERUMALDO, DIFATTA              ACCOUNT NO.:  [id number]   MEDICAL RECORD NO.:  [id number]          PATIENT TYPE:  OIB   LOCATION:  6525                         FACILITY:  [facility]   PHYSICIAN:  Thomas C. Wall, M.D.   DATE OF BIRTH:  1941-03-03   DATE OF ADMISSION:  07/11/2005  DATE OF DISCHARGE:  07/12/2005                                 DISCHARGE SUMMARY   PROCEDURES:  Coronary angiography/TAXUS stenting first diagonal  artery/balloon angioplasty circumflex artery July 11, 2005.   REASON FOR ADMISSION:  Mr. Buenger is a 70 year old male, with known  coronary artery disease status post prior stenting of the mid right coronary  artery in March, 2002, who presented to the office with exertional symptoms  suggestive of angina pectoris.   PRIMARY CARDIOLOGIST:  Dr. Rollene Rotunda.   HOSPITAL COURSE:  Patient presented for elective cardiac catheterization on  July 11, 2005, performed by Dr. Arvilla Meres (see cath report for  full details), revealing significant coronary artery disease in both the  ramus intermedius and circumflex arteries.  Left ventricular function was  normal.   The patient underwent subsequent successful PCI, by Dr. Charlies Constable, with  successful TAXUS stenting of a 90% first diagonal lesion and balloon  angioplasty of a 90% circumflex lesion.  There were no noted complications.   Recommendation is to proceed with outpatient stress Myoview to evaluate the  proximal left anterior descending artery; this was found to have  approximately 50/60% stenosis.   Patient will need to remain on Plavix for at least one year.   Patient was kept for overnight observation, and cleared for discharge the  following morning in hemodynamically stable condition.  The right groin was  stable on exam with no evidence of bruit on auscultation.   Patient enrolled Alla Feeling trial.   LABS AT DISCHARGE:  Hemoglobin 11.3, platelet 270, sodium 136, potassium  3.8, glucose  134, BUN 14, creatinine 1.3.  CPK 199/6.5, troponin I 0.29.  Electrocardiogram at discharge:  Normal sinus rhythm with no ischemic  changes.   MEDICATIONS AT DISCHARGE:  1.  Plavix 75 mg daily (__________).  2.  Coated aspirin 325 mg daily.  3.  Atenolol 50 mg b.i.d.  4.  Altace 10 mg b.i.d.  5.  Cartia XT 180 mg daily.  6.  Zocor 10 mg daily.  7.  Hydrochlorothiazide 25 mg daily.  8.  Klor-Con 20 mEq daily.  9.  Indomethacin 50 mg daily.   DISCHARGE INSTRUCTIONS:  1.  Proceed with scheduled exercise stress Myoview on July 21, 2005, at      9:45 a.m. at [facility].  2.  Followup with Dr. Rollene Rotunda on August 05, 2005, at 11 a.m.   PRINCIPAL DIAGNOSES:  1.  Coronary artery disease progression.      1.  Status post TAXUS stenting first diagonal/percutaneous transluminal          coronary angioplasty circumflex artery July 11, 2005.      2.  Normal left ventricular function.      3.  Status post stent mid right coronary artery in 2002:  Patent by          catheterization this admission.  2.  Hypertension.  3.  Hyperlipidemia.      Gene Serpe, P.A. [facility]      Thomas C. Wall, M.D.  Electronically Signed    GS/MEDQ  D:  07/12/2005  T:  07/13/2005  Job:  [id number]   cc:   Tinnie Gens A. Tawanna Cooler, M.D. [facility]  [address] [place]  [place] [address]

## 2011-01-10 NOTE — Discharge Summary (Signed)
NAME:  TYRIAN, PEART              ACCOUNT NO.:  [id number]   MEDICAL RECORD NO.:  [id number]          PATIENT TYPE:  OIB   LOCATION:  NA                           FACILITY:  [facility]   PHYSICIAN:  Vida Roller, M.D.   DATE OF BIRTH:  10-27-1940   DATE OF ADMISSION:  DATE OF DISCHARGE:                                 DISCHARGE SUMMARY   ADDENDUM:   DISCHARGE DURATION:  31 minutes.      Gene Serpe, P.A. [facility]      Vida Roller, M.D.  Electronically Signed    GS/MEDQ  D:  07/12/2005  T:  07/13/2005  Job:  [id number]

## 2011-01-10 NOTE — H&P (Signed)
. [facility]  Patient:    [name], [name]                     MRN: [id number] Adm. Date:  [id number] Attending:  Molpus, John L CC:         Evette Georges, M.D. [facility], [place], [place]   History and Physical  CHIEF COMPLAINT: This patient is a 70 year old white male, a private patient of Dr. Alonza Smoker, admitted with atypical chest pain associated with shortness of breath and profound diaphoresis.  HISTORY OF PRESENT ILLNESS: The patient had onset of symptoms with chest tightness substernally without radiation and associated with diaphoresis. This lasted approximately an hour and occurred at rest.  He attempted to be seen at the [facility] clinic on November 05, 2000 but there was only one doctor in the clinic so he returned home.  He has had recurrent spells over the three days of this with paroxysmal weakness and shortness of breath.  On November 06, 2000 he had significant chest tightness while digging in the yard.  PAST MEDICAL/SURGICAL HISTORY:  1. Spinal fusion.  2. In high school he had a fractured arm.  CURRENT MEDICATIONS:  1. Atenolol 200 mg q.d.  2. Hydrochlorothiazide 50 mg q.d.  3. Baby aspirin q.d.  4. Lipitor, questionably 10 mg q.d.  5. Indocin b.i.d.  ALLERGIES: No known drug allergies.  SOCIAL HISTORY: He is retired and lives with his wife.  He quit smoking at the age of 28.  He drinks occasionally.  FAMILY HISTORY: Brother had myocardial infarction at the age of 87 and father had myocardial infarction, stroke, and prostate cancer with metastasis.  There is no family history of diabetes.  His mother has long-standing Alzheimers.  REVIEW OF SYSTEMS: He denies any dyspepsia or dysphagia, melena, or rectal bleeding.  He had a cough with yellow sputum approximately a week ago.  He has intermittent sinus headaches.  He has been treated by Dr. Charlett Blake for hip and spine pain with Indocin over the last three months.  He may have  increased urinary frequency but his weight is stable and he has no polydipsia or polyphagia.  PHYSICAL EXAMINATION:  GENERAL: He is in no acute distress at this time.  VITAL SIGNS: He has a sinus rhythm with rate of 61 on telemetry.  Respiratory rate 16, blood pressure 118/82.  Initially blood pressure was 177/105.  HEENT: He has arteriolar narrowing.  Dental hygiene is excellent. Otolaryngeal examination otherwise unremarkable.  NECK: No carotid bruits.  HEART: An S4 is noted.  ABDOMEN: Negative, with no tenderness or masses.  RECTAL: Examination is initially deferred.  EXTREMITIES: Dorsalis pedis pulses are slightly decreased.  Homans sign negative.  NEUROLOGIC: No localizing neurologic signs.  LABORATORY DATA: Glucose is 202.  ABG shows pCO2 of 30.6, room air oxygen saturation 99%.  Hematocrit 34.  Total CPK is 478 with MB of 4, relative index 0.8; troponin also negative.  EKG reveals nonspecific ST changes.  Chest x-ray shows no active disease.  ASSESSMENT:  1. He is now admitted to CCU or step-down with atypical chest pain associated     with paroxysmal shortness of breath and diaphoresis.  2. He has hyperglycemia but has been on hydrochlorothiazide.  3. He also has hypertension, which is well controlled at present.  4. He has arthritis for which he has been taking Indocin.  PLAN:  1. Serial enzymes will be collected.  2. He will be begun on heparin.  3. Stool fecal occult blood monitoring.  4. A spiral CT will be performed to rule out pulmonary embolus and will     be prior to the patient going to the Floor as he is not having chest pain     at this time and is clinically stable.  4. A hemoglobin A1C will be collected.  5. He will be placed on sliding-scale insulin.  6. Indocin will be changed to Celebrex and he will be placed on Protonix     empirically.  7. He does have a strong family history and multiple risk factors and it is     assured he will come  to catheterization. DD:  11/07/00 TD:  11/08/00 Job: [id number]

## 2011-01-10 NOTE — Assessment & Plan Note (Signed)
[facility]                         GASTROENTEROLOGY OFFICE NOTE   [name], [name]                     MRN:          [id number]  DATE:10/22/2006                            DOB:          1940/12/13    SUBJECTIVE:  [name] returns feeling very well.  He states his  energy level has substantially improved.  He returned to see Dr. Tinnie Gens  A. Todd and had a CBC performed in mid-January, and he states his anemia  has resolved.  He has discontinued Repliva.  He has no gastrointestinal  complaints, and specifically denies any reflux symptoms, dysphagia or  odynophagia.   CURRENT MEDICATIONS:  Listed on the chart - updated and reviewed.   ALLERGIES:  No known drug allergies.   PHYSICAL EXAMINATION:  GENERAL:  In no acute distress.  VITAL SIGNS:  Weight 179.8 pounds, blood pressure 138/76, pulse 68 and  regular.  CHEST:  Clear to auscultation bilaterally.  HEART:  A regular rate and rhythm without murmurs.  ABDOMEN:  Soft, nontender, with normoactive bowel sounds.   ASSESSMENT/PLAN:  1. History of a duodenal ulcer. Plan to remain on a proton pump      inhibitor in definitely and continue aspirin 81 mg qd as      recommended by his cardiologist.  No additional aspirin, NSAID or      COX-2 agents.  2. Barrett's esophagus- biopsies indeterminate for dysplasia and      reactive changes:  Continue standard anti-reflux measures and      Protonix 40 mg p.o. b.i.d.  Schedule an endoscopy in March, for      repeat biopsies.  3. Anemia, resolved. Obtain a copy of the recent CBC.     Venita Lick. Russella Dar, MD, [facility]  Electronically Signed    MTS/MedQ  DD: 10/22/2006  DT: 10/22/2006  Job #: [id number]   cc:   Tinnie Gens A. Tawanna Cooler, MD

## 2011-01-10 NOTE — H&P (Signed)
NAME:  [name], [name] NO.:  [id number]   MEDICAL RECORD NO.:  [id number]          PATIENT TYPE:  INP   LOCATION:  5703                         FACILITY:  [facility]   PHYSICIAN:  Gordy Savers, MDDATE OF BIRTH:  November 05, 1940   DATE OF ADMISSION:  05/08/2006  DATE OF DISCHARGE:                                HISTORY & PHYSICAL   CHIEF COMPLAINT:  Weakness.   HISTORY OF PRESENT ILLNESS:  The patient is a 70 year old white gentleman  with a long history of DJD.  He has been on chronic nonsteroidal anti-  inflammatory drug therapy.  For the past 3 days he has noted melena as well  as progressive weakness and exercise intolerance.  He was evaluated in the  office on the day of admission and noted to be pale, slightly hypotensive  and with melanotic heme-positive stool.  He is now admitted for further  evaluation and treatment of his upper GI bleeding.   PAST MEDICAL HISTORY:  As mentioned, he has a long history of a DJD.  He is  status post C-spine fusion in the past.  He has hypertension,  hypercholesterolemia and history of erectile dysfunction.  He has coronary  disease, status post MI in 2002.  Stents were placed at that time.  His last  colonoscopy was in 2004 that revealed diverticular disease.  Additionally,  he has a history of asymptomatic hematuria.  He has also fractures around  the left elbow, right wrist in the past.  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   FAMILY HISTORY:  Father died of 74 of coronary artery disease, also had a  history of pulmonary disease and alcoholism.  Mother lived to be quite  elderly with a history of senile dementia of the Alzheimer's type.   PHYSICAL EXAMINATION:  GENERAL:  A well-developed male in no acute distress.  VITAL SIGNS:  Blood pressure was 104/52.  SKIN:  Unremarkable except for paleness.  HEAD AND NECK:  ENT unremarkable.  Conjunctivae pale.   Neck revealed a  surgical scar involving the left anterior neck area.  No bruits appreciated.  CHEST:  Clear.  CARDIOVASCULAR:  Normal S1, S2.  There is no tachycardia.  ABDOMEN:  Soft, nontender.  There is no organomegaly.  Bowel sounds were  normal.  GENITOURINARY:  External genitalia normal.  RECTAL:  Prostate slightly enlarged and benign.  Stool was black and heme-  positive.  EXTREMITIES:  Negative with intact peripheral pulses.   IMPRESSION:  Probable upper gastrointestinal bleeding secondary to  nonsteroidal anti-inflammatory drug-induced ulceration.  Rule out  significant anemia with paleness and clinical weakness.   DISPOSITION:  The patient will be admitted to hospital.  Laboratory studies  will be reviewed and blood will be typed and crossmatched.  Serial H&H's  will be monitored closely.  Depending on his clinical status, he may be  considered for upper panendoscopy.           ______________________________  Gordy Savers, MD     PFK/MEDQ  D:  05/08/2006  T:  05/08/2006  Job:  [id number]

## 2011-01-30 ENCOUNTER — Other Ambulatory Visit (INDEPENDENT_AMBULATORY_CARE_PROVIDER_SITE_OTHER): Payer: Medicare Other | Admitting: *Deleted

## 2011-01-30 DIAGNOSIS — E785 Hyperlipidemia, unspecified: Secondary | ICD-10-CM

## 2011-01-30 LAB — LIPID PANEL
HDL: 45 mg/dL (ref >39.00)
VLDL: 27.8 mg/dL (ref 0.0–40.0)

## 2011-01-30 LAB — HEPATIC FUNCTION PANEL
ALT: 22 U/L (ref 0–53)
Albumin: 4.1 g/dL (ref 3.5–5.2)
Total Bilirubin: 0.4 mg/dL (ref 0.3–1.2)
Total Protein: 7.1 g/dL (ref 6.0–8.3)

## 2011-02-03 ENCOUNTER — Encounter: Payer: Self-pay | Admitting: *Deleted

## 2011-08-12 ENCOUNTER — Other Ambulatory Visit: Payer: Self-pay | Admitting: Physician Assistant

## 2011-08-24 ENCOUNTER — Other Ambulatory Visit: Payer: Self-pay | Admitting: Cardiology

## 2011-08-25 ENCOUNTER — Other Ambulatory Visit: Payer: Self-pay | Admitting: *Deleted

## 2011-08-25 DIAGNOSIS — I10 Essential (primary) hypertension: Secondary | ICD-10-CM

## 2011-08-25 MED ORDER — HYDROCHLOROTHIAZIDE 25 MG PO TABS: 25.0000 mg | ORAL_TABLET | Freq: Every day | ORAL | Status: DC

## 2011-10-15 DIAGNOSIS — Z85828 Personal history of other malignant neoplasm of skin: Secondary | ICD-10-CM | POA: Diagnosis not present

## 2011-10-15 DIAGNOSIS — D239 Other benign neoplasm of skin, unspecified: Secondary | ICD-10-CM | POA: Diagnosis not present

## 2011-11-19 ENCOUNTER — Other Ambulatory Visit: Payer: Self-pay | Admitting: *Deleted

## 2011-11-19 DIAGNOSIS — I10 Essential (primary) hypertension: Secondary | ICD-10-CM

## 2011-11-19 MED ORDER — RAMIPRIL 10 MG PO TABS: 10.0000 mg | ORAL_TABLET | Freq: Two times a day (BID) | ORAL | Status: DC

## 2011-11-19 MED ORDER — CLOPIDOGREL BISULFATE 75 MG PO TABS: 75.0000 mg | ORAL_TABLET | Freq: Every day | ORAL | Status: DC

## 2011-12-02 ENCOUNTER — Other Ambulatory Visit: Payer: Self-pay | Admitting: *Deleted

## 2011-12-02 ENCOUNTER — Ambulatory Visit (INDEPENDENT_AMBULATORY_CARE_PROVIDER_SITE_OTHER): Payer: Medicare Other | Admitting: Family Medicine

## 2011-12-02 ENCOUNTER — Encounter: Payer: Self-pay | Admitting: Family Medicine

## 2011-12-02 VITALS — BP 110/80 | Temp 97.6°F | Ht 64.5 in | Wt 183.0 lb

## 2011-12-02 DIAGNOSIS — F528 Other sexual dysfunction not due to a substance or known physiological condition: Secondary | ICD-10-CM | POA: Diagnosis not present

## 2011-12-02 DIAGNOSIS — N138 Other obstructive and reflux uropathy: Secondary | ICD-10-CM

## 2011-12-02 DIAGNOSIS — Z0000 Encounter for general adult medical examination without abnormal findings: Secondary | ICD-10-CM

## 2011-12-02 DIAGNOSIS — K2270 Barrett's esophagus without dysplasia: Secondary | ICD-10-CM

## 2011-12-02 DIAGNOSIS — I10 Essential (primary) hypertension: Secondary | ICD-10-CM

## 2011-12-02 DIAGNOSIS — N401 Enlarged prostate with lower urinary tract symptoms: Secondary | ICD-10-CM | POA: Diagnosis not present

## 2011-12-02 DIAGNOSIS — E785 Hyperlipidemia, unspecified: Secondary | ICD-10-CM | POA: Diagnosis not present

## 2011-12-02 DIAGNOSIS — I259 Chronic ischemic heart disease, unspecified: Secondary | ICD-10-CM

## 2011-12-02 DIAGNOSIS — Z87448 Personal history of other diseases of urinary system: Secondary | ICD-10-CM

## 2011-12-02 DIAGNOSIS — M1710 Unilateral primary osteoarthritis, unspecified knee: Secondary | ICD-10-CM

## 2011-12-02 LAB — POCT URINALYSIS DIPSTICK
Bilirubin, UA: NEGATIVE
Glucose, UA: NEGATIVE
Ketones, UA: NEGATIVE
Nitrite, UA: NEGATIVE
Spec Grav, UA: 1.01

## 2011-12-02 LAB — CBC WITH DIFFERENTIAL/PLATELET
Basophils Absolute: 0 10*3/uL (ref 0.0–0.1)
Basophils Relative: 0.3 % (ref 0.0–3.0)
Eosinophils Relative: 1.9 % (ref 0.0–5.0)
HCT: 43.3 % (ref 39.0–52.0)
Hemoglobin: 14.6 g/dL (ref 13.0–17.0)
Lymphocytes Relative: 28 % (ref 12.0–46.0)
Lymphs Abs: 2.6 10*3/uL (ref 0.7–4.0)
Monocytes Relative: 8.1 % (ref 3.0–12.0)
Neutro Abs: 5.7 10*3/uL (ref 1.4–7.7)
RBC: 4.59 Mil/uL (ref 4.22–5.81)

## 2011-12-02 LAB — LIPID PANEL
HDL: 42.3 mg/dL (ref >39.00)
LDL Cholesterol: 85 mg/dL (ref 0–99)
Total CHOL/HDL Ratio: 4
Triglycerides: 133 mg/dL (ref 0.0–149.0)

## 2011-12-02 LAB — BASIC METABOLIC PANEL
Calcium: 10.2 mg/dL (ref 8.4–10.5)
GFR: 55.5 mL/min — ABNORMAL LOW (ref >60.00)
Potassium: 5.4 mEq/L — ABNORMAL HIGH (ref 3.5–5.1)
Sodium: 142 mEq/L (ref 135–145)

## 2011-12-02 LAB — PSA: PSA: 2.77 ng/mL (ref 0.10–4.00)

## 2011-12-02 LAB — HEPATIC FUNCTION PANEL
Albumin: 4.2 g/dL (ref 3.5–5.2)
Total Bilirubin: 0.5 mg/dL (ref 0.3–1.2)

## 2011-12-02 MED ORDER — ATORVASTATIN CALCIUM 20 MG PO TABS: 20.0000 mg | ORAL_TABLET | Freq: Every day | ORAL | Status: DC

## 2011-12-02 MED ORDER — SILDENAFIL CITRATE 100 MG PO TABS: 100.0000 mg | ORAL_TABLET | ORAL | Status: DC | PRN

## 2011-12-02 MED ORDER — HYDROCHLOROTHIAZIDE 25 MG PO TABS: 25.0000 mg | ORAL_TABLET | Freq: Every day | ORAL | Status: DC

## 2011-12-02 MED ORDER — POTASSIUM CHLORIDE ER 10 MEQ PO TBCR: 10.0000 meq | EXTENDED_RELEASE_TABLET | Freq: Two times a day (BID) | ORAL | Status: DC

## 2011-12-02 MED ORDER — RAMIPRIL 10 MG PO TABS: 10.0000 mg | ORAL_TABLET | Freq: Two times a day (BID) | ORAL | Status: DC

## 2011-12-02 MED ORDER — ATENOLOL 50 MG PO TABS: 50.0000 mg | ORAL_TABLET | Freq: Two times a day (BID) | ORAL | Status: DC

## 2011-12-02 MED ORDER — DILTIAZEM HCL ER COATED BEADS 180 MG PO CP24: 180.0000 mg | ORAL_CAPSULE | Freq: Every day | ORAL | Status: DC

## 2011-12-02 MED ORDER — PANTOPRAZOLE SODIUM 40 MG PO TBEC: 40.0000 mg | DELAYED_RELEASE_TABLET | Freq: Every day | ORAL | Status: DC

## 2011-12-02 MED ORDER — CLOPIDOGREL BISULFATE 75 MG PO TABS: 75.0000 mg | ORAL_TABLET | Freq: Every day | ORAL | Status: DC

## 2011-12-02 MED ORDER — NITROGLYCERIN 0.4 MG SL SUBL: 0.4000 mg | SUBLINGUAL_TABLET | SUBLINGUAL | Status: DC | PRN

## 2011-12-02 NOTE — Patient Instructions (Signed)
  Continue your current exercise and medication program Return in one year sooner if any problems

## 2011-12-02 NOTE — Progress Notes (Signed)
Subjective:    Patient ID: [name], male    DOB: 1940-12-27, 71 y.o.   MRN: [id number]  HPI joe  is a 71 year old married male nonsmoker who comes in today for a Medicare wellness examination  For hypertension he takes Tenormin 50 mg twice a day, Cardizem 180 mg daily, hydrochlorothiazide 25 mg daily, 110 mEq potassium supplement and altase  10 mg twice a day BP 110/80  He takes Lipitor 20 mg each bedtime along with an 81 mg baby aspirin for hyperlipidemia we'll check lipids today  Because of undergoing coronary disease and stents x2 last one was 2006 however he remains asymptomatic no chest pain shortness of breath et Karie Soda. He takes Plavix 75 mg daily. He's due to see Dr. Leta Jungling in a couple weeks  He takes protonix  because he has a history of Barrett's esophagus and chronic reflux esophagitis  He uses v.  100 mg when necessary for ED  He remains physically active he walks on a daily basis spends a lot of time caring for his 48 year old granddaughter.  Cognitive function normal hearing normal regular dental care, recent colonoscopy normal tetanus 2008, shingles 2010, Pneumovax x2.  He walks on a regular basis, home health safety reviewed normal, no guns in the house, he does have a health care power of attorney and living well    Review of Systems  Constitutional: Negative.   HENT: Negative.   Eyes: Negative.   Respiratory: Negative.   Cardiovascular: Negative.   Gastrointestinal: Negative.   Genitourinary: Negative.   Musculoskeletal: Negative.   Skin: Negative.   Neurological: Negative.   Hematological: Negative.   Psychiatric/Behavioral: Negative.        Objective:   Physical Exam  Constitutional: He is oriented to person, place, and time. He appears well-developed and well-nourished.  HENT:  Head: Normocephalic and atraumatic.  Right Ear: External ear normal.  Left Ear: External ear normal.  Nose: Nose normal.  Mouth/Throat: Oropharynx is clear and moist.   Eyes: Conjunctivae and EOM are normal. Pupils are equal, round, and reactive to light.  Neck: Normal range of motion. Neck supple. No JVD present. No tracheal deviation present. No thyromegaly present.  Cardiovascular: Normal rate, regular rhythm, normal heart sounds and intact distal pulses.  Exam reveals no gallop and no friction rub.   No murmur heard. Pulmonary/Chest: Effort normal and breath sounds normal. No stridor. No respiratory distress. He has no wheezes. He has no rales. He exhibits no tenderness.  Abdominal: Soft. Bowel sounds are normal. He exhibits no distension and no mass. There is no tenderness. There is no rebound and no guarding.  Genitourinary: Rectum normal and penis normal. Guaiac negative stool. No penile tenderness.       Symmetrical 1+ BPH nonnodular  Musculoskeletal: Normal range of motion. He exhibits no edema and no tenderness.  Lymphadenopathy:    He has no cervical adenopathy.  Neurological: He is alert and oriented to person, place, and time. He has normal reflexes. No cranial nerve deficit. He exhibits normal muscle tone.  Skin: Skin is warm and dry. No rash noted. No erythema. No pallor.       Total body skin exam normal  Psychiatric: He has a normal mood and affect. His behavior is normal. Judgment and thought content normal.          Assessment & Plan:  Healthy male  Hypertension at goal continue current therapy  Hyperlipidemia check lipids  Coronary disease asymptomatic followup in cardiology in 2  weeks  Reflux esophagitis with Barrett's esophagus continue Protonix 40 mg daily  Erectile dysfunction Viagra 100 mg when necessary  Followup in 1 year sooner if any problems

## 2011-12-08 ENCOUNTER — Ambulatory Visit: Payer: Medicare Other | Admitting: Cardiology

## 2011-12-16 ENCOUNTER — Encounter: Payer: Self-pay | Admitting: Cardiology

## 2011-12-16 ENCOUNTER — Ambulatory Visit (INDEPENDENT_AMBULATORY_CARE_PROVIDER_SITE_OTHER): Payer: Medicare Other | Admitting: Cardiology

## 2011-12-16 VITALS — BP 100/60 | HR 57 | Ht 65.0 in | Wt 177.8 lb

## 2011-12-16 DIAGNOSIS — E785 Hyperlipidemia, unspecified: Secondary | ICD-10-CM

## 2011-12-16 DIAGNOSIS — I259 Chronic ischemic heart disease, unspecified: Secondary | ICD-10-CM | POA: Diagnosis not present

## 2011-12-16 DIAGNOSIS — R0789 Other chest pain: Secondary | ICD-10-CM | POA: Diagnosis not present

## 2011-12-16 DIAGNOSIS — I10 Essential (primary) hypertension: Secondary | ICD-10-CM

## 2011-12-16 NOTE — Assessment & Plan Note (Signed)
His LDL was 85 with an HDL of 19.1. This was earlier this month. He can continue the meds as listed.

## 2011-12-16 NOTE — Assessment & Plan Note (Signed)
The blood pressure is at target. No change in medications is indicated. We will continue with therapeutic lifestyle changes (TLC).  

## 2011-12-16 NOTE — Progress Notes (Signed)
HPI The patient presents for routine followup. Since I last saw him he has done well. He walks frequently and exercises at the [facility]. However, he does report having some chest discomfort more recently when he works out on the elliptical. He doesn't describe pain but has a fullness. He stops what he is doing and it goes away. It is not described drove or arm discomfort. He does not describe excessive shortness of breath or diaphoresis. He has no palpitations, presyncope or syncope. He has no PND or orthopnea. He's had no weight gain or edema.  No Known Allergies  Current Outpatient Prescriptions  Medication Sig Dispense Refill  . aspirin 81 MG EC tablet Take 81 mg by mouth daily.        Marland Kitchen atenolol (TENORMIN) 50 MG tablet Take 1 tablet (50 mg total) by mouth 2 (two) times daily.  200 tablet  3  . atorvastatin (LIPITOR) 20 MG tablet Take 1 tablet (20 mg total) by mouth daily.  100 tablet  3  . clopidogrel (PLAVIX) 75 MG tablet Take 1 tablet (75 mg total) by mouth daily.  100 tablet  3  . Coenzyme Q10 (EQL COQ10) 300 MG CAPS Take by mouth 2 (two) times daily.      Marland Kitchen diltiazem (CARDIZEM CD) 180 MG 24 hr capsule Take 1 capsule (180 mg total) by mouth daily.  100 capsule  3  . fish oil-omega-3 fatty acids 1000 MG capsule Take 2 g by mouth daily.      . hydrochlorothiazide (HYDRODIURIL) 25 MG tablet Take 1 tablet (25 mg total) by mouth daily.  100 tablet  3  . Multiple Vitamin (MULTIVITAMIN) capsule Take 1 capsule by mouth daily. Silver men over  50      . Multiple Vitamins-Minerals (CENTRUM PO) Take 1 capsule by mouth daily.        . nitroGLYCERIN (NITROSTAT) 0.4 MG SL tablet Place 1 tablet (0.4 mg total) under the tongue every 5 (five) minutes as needed.  25 tablet  1  . pantoprazole (PROTONIX) 40 MG tablet Take 1 tablet (40 mg total) by mouth daily.  100 tablet  3  . potassium chloride (KLOR-CON 10) 10 MEQ tablet Take 1 tablet (10 mEq total) by mouth 2 (two) times daily.  100 tablet  3  . ramipril  (ALTACE) 10 MG tablet Take 1 tablet (10 mg total) by mouth 2 (two) times daily.  200 tablet  3  . sildenafil (VIAGRA) 100 MG tablet Take 1 tablet (100 mg total) by mouth as needed for erectile dysfunction.  10 tablet  11    Past Medical History  Diagnosis Date  . Hypertension   . Hyperlipidemia   . ED (erectile dysfunction)   . CAD (coronary artery disease)     2002 stenting of the RCA,, 2006 Stenting of diag with DES, PTCA of Circ, 50% LAD  . Barrett's esophagus   . Diverticulosis   . GERD (gastroesophageal reflux disease)   . Hemorrhoids   . Duodenal ulcer   . DJD (degenerative joint disease)     Past Surgical History  Procedure Date  . Spinal fusion   . Spinal fusion   . Spine surgery     cervical    ROS   As stated in the HPI and negative for all other systems.  PHYSICAL EXAM BP 100/60  Pulse 57  Ht 5\' 5"  (1.651 m)  Wt 177 lb 12.8 oz (80.65 kg)  BMI 29.59 kg/m2 GENERAL:  Well appearing  HEENT:  Pupils equal round and reactive, fundi not visualized, oral mucosa unremarkable NECK:  No jugular venous distention, waveform within normal limits, carotid upstroke brisk and symmetric, no bruits, no thyromegaly LYMPHATICS:  No cervical, inguinal adenopathy LUNGS:  Clear to auscultation bilaterally BACK:  No CVA tenderness CHEST:  Unremarkable HEART:  PMI not displaced or sustained,S1 and S2 within normal limits, no S3, no S4, no clicks, no rubs, no murmurs ABD:  Flat, positive bowel sounds normal in frequency in pitch, no bruits, no rebound, no guarding, no midline pulsatile mass, no hepatomegaly, no splenomegaly EXT:  2 plus pulses throughout, no edema, no cyanosis no clubbing SKIN:  No rashes no nodules NEURO:  Cranial nerves II through XII grossly intact, motor grossly intact throughout PSYCH:  Cognitively intact, oriented to person place and time  EKG:  Sinus bradycardia, rate 57, axis within normal limits, intervals WNL, no acute ST T wave changes.   12/16/2011  ASSESSMENT AND PLAN

## 2011-12-16 NOTE — Assessment & Plan Note (Signed)
I am concerned about his exertional discomfort. I think the pretest probability of obstructive coronary disease moderately high. Therefore, stress perfusion imaging is indicated. For now he will continue on the meds as listed.

## 2011-12-16 NOTE — Patient Instructions (Signed)
The current medical regimen is effective;  continue present plan and medications.  Your physician has requested that you have a lexiscan myoview. For further information please visit [email or web address]. Please follow instruction sheet, as given.  Follow up with Dr Hochrein in 4 months. 

## 2011-12-24 ENCOUNTER — Ambulatory Visit (HOSPITAL_COMMUNITY): Payer: Medicare Other | Attending: Cardiology | Admitting: Radiology

## 2011-12-24 VITALS — BP 110/71 | Ht 66.0 in | Wt 174.0 lb

## 2011-12-24 DIAGNOSIS — I10 Essential (primary) hypertension: Secondary | ICD-10-CM | POA: Insufficient documentation

## 2011-12-24 DIAGNOSIS — Z9861 Coronary angioplasty status: Secondary | ICD-10-CM | POA: Insufficient documentation

## 2011-12-24 DIAGNOSIS — Z8249 Family history of ischemic heart disease and other diseases of the circulatory system: Secondary | ICD-10-CM | POA: Diagnosis not present

## 2011-12-24 DIAGNOSIS — R079 Chest pain, unspecified: Secondary | ICD-10-CM | POA: Diagnosis not present

## 2011-12-24 DIAGNOSIS — Z87891 Personal history of nicotine dependence: Secondary | ICD-10-CM | POA: Diagnosis not present

## 2011-12-24 DIAGNOSIS — R5383 Other fatigue: Secondary | ICD-10-CM | POA: Insufficient documentation

## 2011-12-24 DIAGNOSIS — I2510 Atherosclerotic heart disease of native coronary artery without angina pectoris: Secondary | ICD-10-CM | POA: Diagnosis not present

## 2011-12-24 DIAGNOSIS — R5381 Other malaise: Secondary | ICD-10-CM | POA: Insufficient documentation

## 2011-12-24 DIAGNOSIS — I252 Old myocardial infarction: Secondary | ICD-10-CM | POA: Diagnosis not present

## 2011-12-24 DIAGNOSIS — E785 Hyperlipidemia, unspecified: Secondary | ICD-10-CM | POA: Insufficient documentation

## 2011-12-24 DIAGNOSIS — R0789 Other chest pain: Secondary | ICD-10-CM

## 2011-12-24 MED ORDER — TECHNETIUM TC 99M TETROFOSMIN IV KIT
33.0000 | PACK | Freq: Once | INTRAVENOUS | Status: AC | PRN
  Administered 2011-12-24: 33 via INTRAVENOUS

## 2011-12-24 MED ORDER — TECHNETIUM TC 99M TETROFOSMIN IV KIT
11.0000 | PACK | Freq: Once | INTRAVENOUS | Status: AC | PRN
  Administered 2011-12-24: 11 via INTRAVENOUS

## 2011-12-24 MED ORDER — REGADENOSON 0.4 MG/5ML IV SOLN
0.4000 mg | Freq: Once | INTRAVENOUS | Status: AC
  Administered 2011-12-24: 0.4 mg via INTRAVENOUS

## 2011-12-24 NOTE — Progress Notes (Signed)
[facility] SITE 3 NUCLEAR MED [address] [place] [address] [phone]  Cardiology Nuclear Med Study  [name] is a 71 y.o. male     MRN : [id number]     DOB: 1941-05-18  Procedure Date: 12/24/2011  Nuclear Med Background Indication for Stress Test:  Evaluation for Ischemia and PTCA/Stent Patency History:  2002 MI-Stents: RCA, 2006: MPS: mild apical defect EF 64% thinning vs. ischemia, Heart Cath: 90% Diagonial-Stent, 90% CFX- PTCA, 50% LAD, Angioplasty:CFX Cardiac Risk Factors: Family History - CAD, History of Smoking, Hypertension and Lipids  Symptoms:  Chest Pain, Chest Tightness and Fatigue   Nuclear Pre-Procedure Caffeine/Decaff Intake:  None> 12 hrs NPO After: 8:00pm   Lungs:  clear O2 Sat: 98% on room air. IV 0.9% NS with Angio Cath:  22g  IV Site: L Antecubital x 1, tolerated well IV Started by:  Irean Hong, RN  Chest Size (in):  40 Cup Size: n/a  Height: 5\' 6"  (1.676 m)  Weight:  174 lb (78.926 kg)  BMI:  Body mass index is 28.08 kg/(m^2). Tech Comments:  Held atenolol x 24 hrs   The patient was switched to a walking Lexiscan when he wasn't able to reach his target HR.    Nuclear Med Study 1 or 2 day study: 1 day  Stress Test Type:  Treadmill/Lexiscan  Reading MD: Marca Ancona, MD  Order Authorizing Provider:  Rollene Rotunda, MD  Resting Radionuclide: Technetium 20m Tetrofosmin  Resting Radionuclide Dose: 11.0 mCi   Stress Radionuclide:  Technetium 24m Tetrofosmin  Stress Radionuclide Dose: 32.9 mCi           Stress Protocol Rest HR: 51 Stress HR: 115  Rest BP: 110/71 Stress BP: 152/58  Exercise Time (min): 9:11 METS: 8.90   Predicted Max HR: 147 bpm % Max HR: 78.23 bpm Rate Pressure Product: 78469   Dose of Adenosine (mg):  n/a Dose of Lexiscan: 0.4 mg  Dose of Atropine (mg): n/a Dose of Dobutamine: n/a mcg/kg/min (at max HR)  Stress Test Technologist: Milana Na, EMT-P  Nuclear Technologist:  Doyne Keel, CNMT      Rest Procedure:  Myocardial perfusion imaging was performed at rest 45 minutes following the intravenous administration of Technetium 53m Tetrofosmin. Rest ECG: NSR - Normal EKG  Stress Procedure:  The patient received IV Lexiscan 0.4 mg over 15-seconds with concurrent low level exercise and then Technetium 57m Tetrofosmin was injected at 30-seconds while the patient continued walking one more minute. This patient was switched to a walking Lexiscan when the patient couldn't reach his target HR. There were no significant changes, + sob, nausea, and chest pressure with Lexiscan. Quantitative spect images were obtained after a 45-minute delay. Stress ECG: No significant change from baseline ECG  QPS Raw Data Images:  Normal; no motion artifact; normal heart/lung ratio. Stress Images:  Normal homogeneous uptake in all areas of the myocardium. Rest Images:  Normal homogeneous uptake in all areas of the myocardium. Subtraction (SDS):  There is no evidence of scar or ischemia. Transient Ischemic Dilatation (Normal <1.22):  0.99 Lung/Heart Ratio (Normal <0.45):  0.34  Quantitative Gated Spect Images QGS EDV:  92 ml QGS ESV:  32 ml  Impression Exercise Capacity:  Good exercise capacity. Had to switch to [facility] because unable to get HR to goal.  BP Response:  Normal blood pressure response. Clinical Symptoms:  Dyspnea, chest pressure with Lexiscan.  ECG Impression:  No significant ST segment change suggestive of ischemia. Comparison  with Prior Nuclear Study: No significant change from previous study  Overall Impression:  Normal stress nuclear study.  LV Ejection Fraction: 65%.  LV Wall Motion:  NL LV Function; NL Wall Motion  [employer]

## 2012-01-13 DIAGNOSIS — H04129 Dry eye syndrome of unspecified lacrimal gland: Secondary | ICD-10-CM | POA: Diagnosis not present

## 2012-02-09 ENCOUNTER — Ambulatory Visit (INDEPENDENT_AMBULATORY_CARE_PROVIDER_SITE_OTHER): Payer: Medicare Other | Admitting: Family Medicine

## 2012-02-09 ENCOUNTER — Encounter: Payer: Self-pay | Admitting: Family Medicine

## 2012-02-09 VITALS — BP 110/70 | Temp 98.3°F | Wt 182.0 lb

## 2012-02-09 DIAGNOSIS — R300 Dysuria: Secondary | ICD-10-CM

## 2012-02-09 DIAGNOSIS — N401 Enlarged prostate with lower urinary tract symptoms: Secondary | ICD-10-CM | POA: Diagnosis not present

## 2012-02-09 LAB — POCT URINALYSIS DIPSTICK
Bilirubin, UA: NEGATIVE
Glucose, UA: NEGATIVE
Ketones, UA: NEGATIVE
Nitrite, UA: NEGATIVE
Spec Grav, UA: 1.01

## 2012-02-09 MED ORDER — CIPROFLOXACIN HCL 250 MG PO TABS
ORAL_TABLET | ORAL | Status: DC
Start: 2012-02-09 — End: 2012-04-13

## 2012-02-09 NOTE — Progress Notes (Signed)
  Subjective:    Patient ID: [name], male    DOB: 1940/11/16, 71 y.o.   MRN: [id number]  HPI [name] is a 71 year old male who comes in today for evaluation of dysuria and frequency  He had his last episode of prostatitis about a year ago. We had to put him on Cipro to resolve his infection. He's done well since that time. No fever chills or back pain. Symptoms started yesterday with frequency and dysuria   Review of Systems    general and urinary review of systems otherwise negative Objective:   Physical Exam  Well-developed well-nourished male in no acute distress urinalysis shows 2+ blood      Assessment & Plan:  Prostatitis restart Cipro 250 twice a day for 3 weeks

## 2012-02-09 NOTE — Patient Instructions (Signed)
Begin the Cipro take one twice daily until bottle empty,,,,,,,,,,,,, refills x1 return when necessary

## 2012-02-11 DIAGNOSIS — M25569 Pain in unspecified knee: Secondary | ICD-10-CM | POA: Diagnosis not present

## 2012-02-11 DIAGNOSIS — M1710 Unilateral primary osteoarthritis, unspecified knee: Secondary | ICD-10-CM | POA: Diagnosis not present

## 2012-02-18 DIAGNOSIS — M1710 Unilateral primary osteoarthritis, unspecified knee: Secondary | ICD-10-CM | POA: Diagnosis not present

## 2012-03-01 DIAGNOSIS — M1710 Unilateral primary osteoarthritis, unspecified knee: Secondary | ICD-10-CM | POA: Diagnosis not present

## 2012-04-13 ENCOUNTER — Ambulatory Visit (INDEPENDENT_AMBULATORY_CARE_PROVIDER_SITE_OTHER): Payer: Medicare Other | Admitting: Cardiology

## 2012-04-13 ENCOUNTER — Encounter: Payer: Self-pay | Admitting: Cardiology

## 2012-04-13 VITALS — BP 110/78 | HR 61 | Ht 66.0 in | Wt 181.0 lb

## 2012-04-13 DIAGNOSIS — I10 Essential (primary) hypertension: Secondary | ICD-10-CM

## 2012-04-13 DIAGNOSIS — I259 Chronic ischemic heart disease, unspecified: Secondary | ICD-10-CM

## 2012-04-13 DIAGNOSIS — E785 Hyperlipidemia, unspecified: Secondary | ICD-10-CM

## 2012-04-13 NOTE — Progress Notes (Signed)
HPI The patient presents for follow up of his CAD.  At the last visit he did discuss some chest pain.  I did send him for a stress perfusion study that was normal without evidence of ischemia or infarct.  Since I last saw him he has done well.  The patient denies any new symptoms such as chest discomfort, neck or arm discomfort. There has been no new shortness of breath, PND or orthopnea. There have been no reported palpitations, presyncope or syncope.  No Known Allergies  Current Outpatient Prescriptions  Medication Sig Dispense Refill  . aspirin 81 MG EC tablet Take 81 mg by mouth daily.        Marland Kitchen atenolol (TENORMIN) 50 MG tablet Take 1 tablet (50 mg total) by mouth 2 (two) times daily.  200 tablet  3  . atorvastatin (LIPITOR) 20 MG tablet Take 1 tablet (20 mg total) by mouth daily.  100 tablet  3  . clopidogrel (PLAVIX) 75 MG tablet Take 1 tablet (75 mg total) by mouth daily.  100 tablet  3  . Coenzyme Q10 (EQL COQ10) 300 MG CAPS Take by mouth 2 (two) times daily.      Marland Kitchen diltiazem (CARDIZEM CD) 180 MG 24 hr capsule Take 1 capsule (180 mg total) by mouth daily.  100 capsule  3  . hydrochlorothiazide (HYDRODIURIL) 25 MG tablet Take 1 tablet (25 mg total) by mouth daily.  100 tablet  3  . Multiple Vitamin (MULTIVITAMIN) capsule Take 1 capsule by mouth daily. Silver men over  50      . nitroGLYCERIN (NITROSTAT) 0.4 MG SL tablet Place 1 tablet (0.4 mg total) under the tongue every 5 (five) minutes as needed.  25 tablet  1  . pantoprazole (PROTONIX) 40 MG tablet Take 1 tablet (40 mg total) by mouth daily.  100 tablet  3  . potassium chloride (KLOR-CON 10) 10 MEQ tablet Take 1 tablet (10 mEq total) by mouth 2 (two) times daily.  100 tablet  3  . ramipril (ALTACE) 10 MG tablet Take 1 tablet (10 mg total) by mouth 2 (two) times daily.  200 tablet  3  . sildenafil (VIAGRA) 100 MG tablet Take 1 tablet (100 mg total) by mouth as needed for erectile dysfunction.  10 tablet  11  . fish oil-omega-3 fatty  acids 1000 MG capsule Take 2 g by mouth daily.        Past Medical History  Diagnosis Date  . Hypertension   . Hyperlipidemia   . ED (erectile dysfunction)   . CAD (coronary artery disease)     2002 stenting of the RCA,, 2006 Stenting of diag with DES, PTCA of Circ, 50% LAD  . Barrett's esophagus   . Diverticulosis   . GERD (gastroesophageal reflux disease)   . Hemorrhoids   . Duodenal ulcer   . DJD (degenerative joint disease)     Past Surgical History  Procedure Date  . Spinal fusion   . Spinal fusion   . Spine surgery     cervical    ROS   As stated in the HPI and negative for all other systems.  PHYSICAL EXAM BP 110/78  Pulse 61  Ht 5\' 6"  (1.676 m)  Wt 181 lb (82.101 kg)  BMI 29.21 kg/m2 GENERAL:  Well appearing HEENT:  Pupils equal round and reactive, fundi not visualized, oral mucosa unremarkable NECK:  No jugular venous distention, waveform within normal limits, carotid upstroke brisk and symmetric, no bruits, no thyromegaly  LYMPHATICS:  No cervical, inguinal adenopathy LUNGS:  Clear to auscultation bilaterally BACK:  No CVA tenderness CHEST:  Unremarkable HEART:  PMI not displaced or sustained,S1 and S2 within normal limits, no S3, no S4, no clicks, no rubs, no murmurs ABD:  Flat, positive bowel sounds normal in frequency in pitch, no bruits, no rebound, no guarding, no midline pulsatile mass, no hepatomegaly, no splenomegaly EXT:  2 plus pulses throughout, no edema, no cyanosis no clubbing SKIN:  No rashes no nodules NEURO:  Cranial nerves II through XII grossly intact, motor grossly intact throughout PSYCH:  Cognitively intact, oriented to person place and time  EKG:  Sinus bradycardia, rate 61, axis within normal limits, intervals WNL, no acute ST T wave changes.  04/13/2012  ASSESSMENT AND PLAN  CORONARY ARTERY DISEASE, S/P PTCA -  The patient has no new sypmtoms.  No further cardiovascular testing is indicated.  We will continue with aggressive risk  reduction and meds as listed.  HYPERLIPIDEMIA -  His LDL was 85 with an HDL of 45.4. This was earlier this month. He can continue the meds as listed.  HYPERTENSION -  The blood pressure is at target. No change in medications is indicated. We will continue with therapeutic lifestyle changes (TLC).

## 2012-04-13 NOTE — Patient Instructions (Addendum)
The current medical regimen is effective;  continue present plan and medications.  Follow up in 1 year with Dr Hochrein.  You will receive a letter in the mail 2 months before you are due.  Please call us when you receive this letter to schedule your follow up appointment.  

## 2012-05-18 ENCOUNTER — Ambulatory Visit (INDEPENDENT_AMBULATORY_CARE_PROVIDER_SITE_OTHER): Payer: Medicare Other | Admitting: Family Medicine

## 2012-05-18 ENCOUNTER — Encounter: Payer: Self-pay | Admitting: Family Medicine

## 2012-05-18 VITALS — BP 100/60 | Temp 98.5°F | Wt 183.0 lb

## 2012-05-18 DIAGNOSIS — L723 Sebaceous cyst: Secondary | ICD-10-CM | POA: Insufficient documentation

## 2012-05-18 DIAGNOSIS — Z23 Encounter for immunization: Secondary | ICD-10-CM

## 2012-05-18 NOTE — Patient Instructions (Signed)
Return when necessary 

## 2012-05-18 NOTE — Progress Notes (Signed)
  Subjective:    Patient ID: [name], male    DOB: [birth year], 71 y.o.   MRN: [id number]  HPI [name] is a 71 year old married male nonsmoker who comes in today for evaluation of an enlarging lump on the back of his scalp  He states his got and gradually bigger over the past month. No trauma   Review of Systems    general and dermatologic review of systems otherwise negative Objective:   Physical Exam Well-developed well nourished male in no acute distress examination scalp shows a marble-sized sebaceous cyst.  After informed consent he was taken to the treatment room the area was cleaned with alcohol and anesthetized with 1% Xylocaine with epinephrine. A quarter inch incision was made the cyst was excised pressure was applied he tolerated the procedure no complications       Assessment & Plan:  Sebaceous cyst removed return when necessary

## 2012-11-04 ENCOUNTER — Other Ambulatory Visit: Payer: Self-pay | Admitting: *Deleted

## 2012-11-04 DIAGNOSIS — I10 Essential (primary) hypertension: Secondary | ICD-10-CM

## 2012-11-04 MED ORDER — RAMIPRIL 10 MG PO TABS: 10.0000 mg | ORAL_TABLET | Freq: Two times a day (BID) | ORAL | Status: DC

## 2012-11-04 MED ORDER — DILTIAZEM HCL ER COATED BEADS 180 MG PO CP24: 180.0000 mg | ORAL_CAPSULE | Freq: Every day | ORAL | Status: DC

## 2012-11-04 MED ORDER — ATENOLOL 50 MG PO TABS: 50.0000 mg | ORAL_TABLET | Freq: Two times a day (BID) | ORAL | Status: DC

## 2012-11-04 MED ORDER — HYDROCHLOROTHIAZIDE 25 MG PO TABS: 25.0000 mg | ORAL_TABLET | Freq: Every day | ORAL | Status: DC

## 2012-11-04 MED ORDER — CLOPIDOGREL BISULFATE 75 MG PO TABS: 75.0000 mg | ORAL_TABLET | Freq: Every day | ORAL | Status: DC

## 2012-11-24 DIAGNOSIS — Z85828 Personal history of other malignant neoplasm of skin: Secondary | ICD-10-CM | POA: Diagnosis not present

## 2012-11-24 DIAGNOSIS — L570 Actinic keratosis: Secondary | ICD-10-CM | POA: Diagnosis not present

## 2012-12-01 ENCOUNTER — Ambulatory Visit (INDEPENDENT_AMBULATORY_CARE_PROVIDER_SITE_OTHER): Payer: Medicare Other | Admitting: Family Medicine

## 2012-12-01 ENCOUNTER — Encounter: Payer: Self-pay | Admitting: Family Medicine

## 2012-12-01 VITALS — BP 120/84 | Temp 98.3°F | Ht 65.0 in | Wt 181.0 lb

## 2012-12-01 DIAGNOSIS — N138 Other obstructive and reflux uropathy: Secondary | ICD-10-CM

## 2012-12-01 DIAGNOSIS — I10 Essential (primary) hypertension: Secondary | ICD-10-CM

## 2012-12-01 DIAGNOSIS — E785 Hyperlipidemia, unspecified: Secondary | ICD-10-CM

## 2012-12-01 DIAGNOSIS — N401 Enlarged prostate with lower urinary tract symptoms: Secondary | ICD-10-CM

## 2012-12-01 DIAGNOSIS — I259 Chronic ischemic heart disease, unspecified: Secondary | ICD-10-CM

## 2012-12-01 DIAGNOSIS — F528 Other sexual dysfunction not due to a substance or known physiological condition: Secondary | ICD-10-CM | POA: Diagnosis not present

## 2012-12-01 DIAGNOSIS — Z0000 Encounter for general adult medical examination without abnormal findings: Secondary | ICD-10-CM

## 2012-12-01 DIAGNOSIS — K2270 Barrett's esophagus without dysplasia: Secondary | ICD-10-CM

## 2012-12-01 DIAGNOSIS — M1710 Unilateral primary osteoarthritis, unspecified knee: Secondary | ICD-10-CM

## 2012-12-01 DIAGNOSIS — Z87448 Personal history of other diseases of urinary system: Secondary | ICD-10-CM

## 2012-12-01 LAB — POCT URINALYSIS DIPSTICK
Leukocytes, UA: NEGATIVE
Protein, UA: NEGATIVE
Spec Grav, UA: 1.02
Urobilinogen, UA: 0.2

## 2012-12-01 LAB — HEPATIC FUNCTION PANEL
ALT: 25 U/L (ref 0–53)
Alkaline Phosphatase: 65 U/L (ref 39–117)
Bilirubin, Direct: 0.1 mg/dL (ref 0.0–0.3)
Total Protein: 7.5 g/dL (ref 6.0–8.3)

## 2012-12-01 LAB — BASIC METABOLIC PANEL
CO2: 29 mEq/L (ref 19–32)
Calcium: 10.2 mg/dL (ref 8.4–10.5)
Creatinine, Ser: 1.4 mg/dL (ref 0.4–1.5)

## 2012-12-01 LAB — CBC WITH DIFFERENTIAL/PLATELET
Eosinophils Absolute: 0.2 10*3/uL (ref 0.0–0.7)
MCHC: 33.8 g/dL (ref 30.0–36.0)
MCV: 92.8 fl (ref 78.0–100.0)
Monocytes Absolute: 0.8 10*3/uL (ref 0.1–1.0)
Neutrophils Relative %: 52.8 % (ref 43.0–77.0)
Platelets: 249 10*3/uL (ref 150.0–400.0)
RDW: 13.4 % (ref 11.5–14.6)

## 2012-12-01 LAB — TSH: TSH: 0.86 u[IU]/mL (ref 0.35–5.50)

## 2012-12-01 LAB — LIPID PANEL
Cholesterol: 170 mg/dL (ref 0–200)
VLDL: 30.4 mg/dL (ref 0.0–40.0)

## 2012-12-01 MED ORDER — VERAPAMIL HCL ER 180 MG PO TBCR: 180.0000 mg | EXTENDED_RELEASE_TABLET | Freq: Every day | ORAL | Status: DC

## 2012-12-01 MED ORDER — PANTOPRAZOLE SODIUM 40 MG PO TBEC: 40.0000 mg | DELAYED_RELEASE_TABLET | Freq: Every day | ORAL | Status: DC

## 2012-12-01 MED ORDER — NITROGLYCERIN 0.4 MG SL SUBL: 0.4000 mg | SUBLINGUAL_TABLET | SUBLINGUAL | Status: DC | PRN

## 2012-12-01 MED ORDER — POTASSIUM CHLORIDE ER 10 MEQ PO TBCR: 10.0000 meq | EXTENDED_RELEASE_TABLET | Freq: Two times a day (BID) | ORAL | Status: DC

## 2012-12-01 MED ORDER — SILDENAFIL CITRATE 100 MG PO TABS: 100.0000 mg | ORAL_TABLET | ORAL | Status: DC | PRN

## 2012-12-01 MED ORDER — LISINOPRIL 20 MG PO TABS: 20.0000 mg | ORAL_TABLET | Freq: Every day | ORAL | Status: DC

## 2012-12-01 MED ORDER — ATORVASTATIN CALCIUM 20 MG PO TABS: 20.0000 mg | ORAL_TABLET | Freq: Every day | ORAL | Status: DC

## 2012-12-01 MED ORDER — CLOPIDOGREL BISULFATE 75 MG PO TABS: 75.0000 mg | ORAL_TABLET | Freq: Every day | ORAL | Status: DC

## 2012-12-01 MED ORDER — ATENOLOL 50 MG PO TABS: 50.0000 mg | ORAL_TABLET | Freq: Two times a day (BID) | ORAL | Status: DC

## 2012-12-01 MED ORDER — HYDROCHLOROTHIAZIDE 25 MG PO TABS: 25.0000 mg | ORAL_TABLET | Freq: Every day | ORAL | Status: DC

## 2012-12-01 NOTE — Progress Notes (Signed)
  Subjective:    Patient ID: [name], male    DOB: [birth year], 72 y.o.   MRN: [id number]  HPI Terrill is a 72 year old married male nonsmoker who comes in today for a Medicare wellness examination because of a history of hypertension, hyperlipidemia, coronary artery disease,,,,,,, since 2002 in 2006 as well outlined in his cardiac note,,,,,,,,,,, history of reflux esophagitis in the esophagus erectile dysfunction  His medications his past medical history social history etc. Reviewed in detail there've been no changes. He recently had a cardiac evaluation by his cardiologist Dr. Nickie Retort.  He gets routine eye care, dental care, recent colonoscopy, vaccinations up-to-date,  Cognitive function normal he walks on a regular basis home health safety reviewed no issues identified, no guns in the house, he does have a health care power of attorney and living will   Review of Systems  Constitutional: Negative.   HENT: Negative.   Eyes: Negative.   Respiratory: Negative.   Cardiovascular: Negative.   Gastrointestinal: Negative.   Genitourinary: Negative.   Musculoskeletal: Negative.   Skin: Negative.   Neurological: Negative.   Psychiatric/Behavioral: Negative.        Objective:   Physical Exam  Constitutional: He is oriented to person, place, and time. He appears well-developed and well-nourished. No distress.  HENT:  Head: Normocephalic and atraumatic.  Right Ear: External ear normal.  Left Ear: External ear normal.  Nose: Nose normal.  Mouth/Throat: Oropharynx is clear and moist.  Eyes: Conjunctivae and EOM are normal. Pupils are equal, round, and reactive to light.  Neck: Normal range of motion. Neck supple. No JVD present. No tracheal deviation present. No thyromegaly present.  Cardiovascular: Normal rate, regular rhythm, normal heart sounds and intact distal pulses.  Exam reveals no gallop and no friction rub.   No murmur heard. No carotid or aortic bruits. Peripheral pulses  2+ and symmetrical  Pulmonary/Chest: Effort normal and breath sounds normal. No stridor. No respiratory distress. He has no wheezes. He has no rales. He exhibits no tenderness.  Abdominal: Soft. Bowel sounds are normal. He exhibits no distension and no mass. There is no tenderness. There is no rebound and no guarding.  Genitourinary: Rectum normal, prostate normal and penis normal. Guaiac negative stool. No penile tenderness.  Musculoskeletal: Normal range of motion. He exhibits no edema and no tenderness.  Lymphadenopathy:    He has no cervical adenopathy.  Neurological: He is alert and oriented to person, place, and time. He has normal reflexes. No cranial nerve deficit. He exhibits normal muscle tone.  Skin: Skin is warm and dry. No rash noted. He is not diaphoretic. No erythema. No pallor.  Total body skin exam shows same scars where he recently had some premalignant lesions frozen by his dermatologist  Psychiatric: He has a normal mood and affect. His behavior is normal. Judgment and thought content normal.          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

## 2012-12-01 NOTE — Patient Instructions (Addendum)
Continue your current medications  Followup in 1 year sooner if any problems for your annual Medicare wellness exam

## 2013-01-14 ENCOUNTER — Encounter: Payer: Self-pay | Admitting: Gastroenterology

## 2013-01-18 ENCOUNTER — Telehealth: Payer: Self-pay | Admitting: Gastroenterology

## 2013-01-18 NOTE — Telephone Encounter (Signed)
Left message for patient to call back  

## 2013-01-19 NOTE — Telephone Encounter (Signed)
Patient due for endo recall now and colon recall for August.  He is on Plavix.  He will come in on Monday 01/24/13 10:45 to see Doug Sou, PA to discuss and schedule

## 2013-01-24 ENCOUNTER — Telehealth: Payer: Self-pay | Admitting: *Deleted

## 2013-01-24 ENCOUNTER — Encounter: Payer: Self-pay | Admitting: Gastroenterology

## 2013-01-24 ENCOUNTER — Ambulatory Visit (INDEPENDENT_AMBULATORY_CARE_PROVIDER_SITE_OTHER): Payer: Medicare Other | Admitting: Gastroenterology

## 2013-01-24 VITALS — BP 110/76 | HR 70 | Ht 66.0 in | Wt 179.0 lb

## 2013-01-24 DIAGNOSIS — K2270 Barrett's esophagus without dysplasia: Secondary | ICD-10-CM

## 2013-01-24 DIAGNOSIS — Z1211 Encounter for screening for malignant neoplasm of colon: Secondary | ICD-10-CM

## 2013-01-24 MED ORDER — PEG-KCL-NACL-NASULF-NA ASC-C 100 G PO SOLR: 1.0000 | Freq: Once | ORAL | Status: DC

## 2013-01-24 NOTE — Patient Instructions (Addendum)
You have been scheduled for an endoscopy and colonoscopy with propofol. Please follow the written instructions given to you at your visit today. Please pick up your prep at the pharmacy within the next 1-3 days. If you use inhalers (even only as needed), please bring them with you on the day of your procedure. Your physician has requested that you go to www.[email or web address] and enter the access code given to you at your visit today. This web site gives a general overview about your procedure. However, you should still follow specific instructions given to you by our office regarding your preparation for the procedure.  You will be contacted by our office prior to your procedure for directions on holding your Plavix.  If you do not hear from our office 1 week prior to your scheduled procedure, please call [phone] to discuss.

## 2013-01-24 NOTE — Addendum Note (Signed)
Addended by: Marlowe Kays on: 01/24/2013 11:37 AM   Modules accepted: Orders

## 2013-01-24 NOTE — Telephone Encounter (Signed)
Ok to hold Plavix x 5 days prior to the procedure.

## 2013-01-24 NOTE — Telephone Encounter (Signed)
[facility] [address] [place]  [phone]  01/24/2013    RE: [name] DOB: [birth year] MRN: [id number]   Dear Dr Antoine Poche     We have scheduled the above patient for an endoscopic procedure. Our records show that he is on anticoagulation therapy.   Please advise as to how long the patient may come off his therapy of Plavix prior to the procedure, which is scheduled for 04/15/2013.  Please fax back/ or route the completed form to Malana Eberwein  at [phone].   Sincerely,  Merri Ray

## 2013-01-24 NOTE — Progress Notes (Signed)
01/24/2013 MINOR IDEN [id number] [birth year]   History of Present Illness: Patient is a pleasant 72 year old male who is a patient of Dr. Ardell Isaacs.  He has history Barrett's esophagus and is on protonix once daily.  His last EGD in 01/2010 confirmed Barrett's on biopsies but did not show any dysplasia, therefore, recall was entered for June of 2014.  He says that overall his reflux has been well controlled unless he eats something too late at night; tries not to eat past 6:30 pm.  His last colonoscopy was 07/2013 at which time he had only hemorrhoids and diverticulosis with repeat recommended in 10 years from that time.  He denies any issues with his bowels, including blood in the stool and change in bowel habits.    He is on Plavix but has been on this for several years.  We will contact Dr. Antoine Poche for ok to hold for procedures.  He has not experienced any new health issues or surgeries since he was last seen in our office 3 years ago.  Current Medications, Allergies, Past Medical History, Past Surgical History, Family History and Social History were reviewed in [employer] record.   Physical Exam: BP 110/76  Pulse 70  Ht 5\' 6"  (1.676 m)  Wt 179 lb (81.194 kg)  BMI 28.91 kg/m2 General: Well developed, white male in no acute distress Head: Normocephalic and atraumatic Eyes:  sclerae anicteric, conjunctiva pink  Ears: Normal auditory acuity Lungs: Clear throughout to auscultation Heart: Regular rate and rhythm Abdomen: Soft, non tender and non distended. No masses, no hepatomegaly. Normal bowel sounds. Rectal: Deferred.  Will be performed at time of colonoscopy. Musculoskeletal: Symmetrical with no gross deformities  Extremities: No edema  Neurological: Alert oriented x 4, grossly nonfocal Psychological:  Alert and cooperative. Normal mood and affect  Assessment and Recommendations: -Barrett's esophagus:  Last EGD 01/2010 without dysplasia.  Will schedule EGD.   Continue daily protonix. -Screening colonoscopy:  Will schedule procedure.  The risks, benefits, and alternatives were discussed with the patient and he consents to proceed.  The risks benefits and alternatives to a temporary hold of anti-coagulants/anti-platelets for the procedure were discussed with the patient they consent to proceed. Obtain clearance from Dr. Antoine Poche.

## 2013-01-24 NOTE — Telephone Encounter (Signed)
Will forward to MD for review and orders 

## 2013-01-24 NOTE — Progress Notes (Signed)
Reviewed and agree with management plan. Prior colonoscopy date is 07/2003.  Venita Lick. Russella Dar, MD [facility]

## 2013-02-11 DIAGNOSIS — H25019 Cortical age-related cataract, unspecified eye: Secondary | ICD-10-CM | POA: Diagnosis not present

## 2013-02-11 DIAGNOSIS — H2510 Age-related nuclear cataract, unspecified eye: Secondary | ICD-10-CM | POA: Diagnosis not present

## 2013-02-11 DIAGNOSIS — H3530 Unspecified macular degeneration: Secondary | ICD-10-CM | POA: Diagnosis not present

## 2013-02-11 DIAGNOSIS — H25049 Posterior subcapsular polar age-related cataract, unspecified eye: Secondary | ICD-10-CM | POA: Diagnosis not present

## 2013-04-15 ENCOUNTER — Ambulatory Visit (AMBULATORY_SURGERY_CENTER): Payer: Medicare Other | Admitting: Gastroenterology

## 2013-04-15 ENCOUNTER — Encounter: Payer: Self-pay | Admitting: Gastroenterology

## 2013-04-15 ENCOUNTER — Encounter: Payer: Medicare Other | Admitting: Gastroenterology

## 2013-04-15 VITALS — BP 104/41 | HR 51 | Temp 97.3°F | Resp 16 | Ht 66.0 in | Wt 179.0 lb

## 2013-04-15 DIAGNOSIS — D126 Benign neoplasm of colon, unspecified: Secondary | ICD-10-CM

## 2013-04-15 DIAGNOSIS — I2510 Atherosclerotic heart disease of native coronary artery without angina pectoris: Secondary | ICD-10-CM | POA: Diagnosis not present

## 2013-04-15 DIAGNOSIS — I10 Essential (primary) hypertension: Secondary | ICD-10-CM | POA: Diagnosis not present

## 2013-04-15 DIAGNOSIS — K2270 Barrett's esophagus without dysplasia: Secondary | ICD-10-CM | POA: Diagnosis not present

## 2013-04-15 DIAGNOSIS — Z1211 Encounter for screening for malignant neoplasm of colon: Secondary | ICD-10-CM | POA: Diagnosis not present

## 2013-04-15 MED ORDER — SODIUM CHLORIDE 0.9 % IV SOLN: 500.0000 mL | INTRAVENOUS | Status: DC

## 2013-04-15 NOTE — Progress Notes (Signed)
Called to room to assist during endoscopic procedure.  Patient ID and intended procedure confirmed with present staff. Received instructions for my participation in the procedure from the performing physician.  

## 2013-04-15 NOTE — Progress Notes (Signed)
Patient did not experience any of the following events: a burn prior to discharge; a fall within the facility; wrong site/side/patient/procedure/implant event; or a hospital transfer or hospital admission upon discharge from the facility. (G8907) Patient did not have preoperative order for IV antibiotic SSI prophylaxis. (G8918)  

## 2013-04-15 NOTE — Progress Notes (Signed)
Procedure endss, to recovery, report given and VSS. 

## 2013-04-15 NOTE — Patient Instructions (Signed)
Restart your Plavix tomorrow.    YOU HAD AN ENDOSCOPIC PROCEDURE TODAY AT THE [facility]: Refer to the procedure report that was given to you for any specific questions about what was found during the examination.  If the procedure report does not answer your questions, please call your gastroenterologist to clarify.  If you requested that your care partner not be given the details of your procedure findings, then the procedure report has been included in a sealed envelope for you to review at your convenience later.  YOU SHOULD EXPECT: Some feelings of bloating in the abdomen. Passage of more gas than usual.  Walking can help get rid of the air that was put into your GI tract during the procedure and reduce the bloating. If you had a lower endoscopy (such as a colonoscopy or flexible sigmoidoscopy) you may notice spotting of blood in your stool or on the toilet paper. If you underwent a bowel prep for your procedure, then you may not have a normal bowel movement for a few days.  DIET: Your first meal following the procedure should be a light meal and then it is ok to progress to your normal diet.  A half-sandwich or bowl of soup is an example of a good first meal.  Heavy or fried foods are harder to digest and may make you feel nauseous or bloated.  Likewise meals heavy in dairy and vegetables can cause extra gas to form and this can also increase the bloating.  Drink plenty of fluids but you should avoid alcoholic beverages for 24 hours.  ACTIVITY: Your care partner should take you home directly after the procedure.  You should plan to take it easy, moving slowly for the rest of the day.  You can resume normal activity the day after the procedure however you should NOT DRIVE or use heavy machinery for 24 hours (because of the sedation medicines used during the test).    SYMPTOMS TO REPORT IMMEDIATELY: A gastroenterologist can be reached at any hour.  During normal business hours, 8:30 AM  to 5:00 PM Monday through Friday, call [phone].  After hours and on weekends, please call the GI answering service at [phone] who will take a message and have the physician on call contact you.   Following lower endoscopy (colonoscopy or flexible sigmoidoscopy):  Excessive amounts of blood in the stool  Significant tenderness or worsening of abdominal pains  Swelling of the abdomen that is new, acute  Fever of 100F or higher  Following upper endoscopy (EGD)  Vomiting of blood or coffee ground material  New chest pain or pain under the shoulder blades  Painful or persistently difficult swallowing  New shortness of breath  Fever of 100F or higher  Black, tarry-looking stools  FOLLOW UP: If any biopsies were taken you will be contacted by phone or by letter within the next 1-3 weeks.  Call your gastroenterologist if you have not heard about the biopsies in 3 weeks.  Our staff will call the home number listed on your records the next business day following your procedure to check on you and address any questions or concerns that you may have at that time regarding the information given to you following your procedure. This is a courtesy call and so if there is no answer at the home number and we have not heard from you through the emergency physician on call, we will assume that you have returned to your regular daily activities without  incident.  SIGNATURES/CONFIDENTIALITY: You and/or your care partner have signed paperwork which will be entered into your electronic medical record.  These signatures attest to the fact that that the information above on your After Visit Summary has been reviewed and is understood.  Full responsibility of the confidentiality of this discharge information lies with you and/or your care-partner.

## 2013-04-15 NOTE — Op Note (Signed)
[facility] [address] [place], [address]   COLONOSCOPY PROCEDURE REPORT PATIENT: [name], [name]  MR#: [id number] BIRTHDATE: Feb 11, 1941 , 72  yrs. old GENDER: Male ENDOSCOPIST: Meryl Dare, MD, [facility] PROCEDURE DATE:  04/15/2013 PROCEDURE:   Colonoscopy with snare polypectomy First Screening Colonoscopy - Avg.  risk and is 50 yrs.  old or older - No.  Prior Negative Screening - Now for repeat screening. 10 or more years since last screening  History of Adenoma - Now for follow-up colonoscopy & has been > or = to 3 yrs.  N/A  Polyps Removed Today? Yes. ASA CLASS:   Class II INDICATIONS:average risk screening. MEDICATIONS: MAC sedation, administered by CRNA and propofol (Diprivan) 250mg  IV DESCRIPTION OF PROCEDURE:   After the risks benefits and alternatives of the procedure were thoroughly explained, informed consent was obtained.  A digital rectal exam revealed no abnormalities of the rectum.   The LB [id number]  endoscope was introduced through the anus and advanced to the cecum, which was identified by both the appendix and ileocecal valve. No adverse events experienced.   The quality of the prep was good, using MoviPrep  The instrument was then slowly withdrawn as the colon was fully examined.  COLON FINDINGS: Two sessile polyps measuring 5-7 mm in size were found at the cecum.  A polypectomy was performed with a cold snare. The resection was complete and the polyp tissue was completely retrieved.   A sessile polyp measuring 5 mm in size was found in the sigmoid colon.  A polypectomy was performed with a cold snare. The resection was complete and the polyp tissue was completely retrieved.   Moderate diverticulosis was noted in the transverse colon, descending colon, and sigmoid colon.   The colon was otherwise normal.  There was no diverticulosis, inflammation, polyps or cancers unless previously stated.  Retroflexed views revealed small  internal hemorrhoids. The time to cecum=2 minutes 34 seconds.  Withdrawal time=8 minutes 34 seconds.  The scope was withdrawn and the procedure completed. COMPLICATIONS: There were no complications. ENDOSCOPIC IMPRESSION: 1.   Two sessile polyps measuring 5-7 mm at the cecum; polypectomy performed with a cold snare 2.   Sessile polyp measuring 5 mm in the sigmoid colon; polypectomy performed with a cold snare 3.   Moderate diverticulosis in the transverse colon, descending colon, and sigmoid colon 4.   Small internal hemorrhoids  RECOMMENDATIONS: 1.  Await pathology results 2.  Repeat colonoscopy in 5 years if polyp(s) adenomatous; otherwise no plans for repeat screening colonoscopy due to age 60.  High fiber diet with liberal fluid intake.  eSigned:  Meryl Dare, MD, [facility] 04/15/2013 9:27 AM

## 2013-04-15 NOTE — Op Note (Signed)
 [facility] [address] [place], [address]   ENDOSCOPY PROCEDURE REPORT  PATIENT: Steven, [name]  MR#: [id number] BIRTHDATE: 02-14-1941 , 72  yrs. old GENDER: Male ENDOSCOPIST: Meryl Dare, MD, [facility] PROCEDURE DATE:  04/15/2013 PROCEDURE:  EGD w/ biopsy ASA CLASS:     Class II INDICATIONS:  follow up of Barrett's esophagus. MEDICATIONS: There was residual sedation effect present from prior procedure, MAC sedation, administered by CRNA, and propofol (Diprivan) 200mg  IV TOPICAL ANESTHETIC: none DESCRIPTION OF PROCEDURE: After the risks benefits and alternatives of the procedure were thoroughly explained, informed consent was obtained.  The LB [id number] endoscope was introduced through the mouth and advanced to the second portion of the duodenum  without limitations.  The instrument was slowly withdrawn as the mucosa was fully examined.  ESOPHAGUS: There was evidence of Barrett's esophagus in the lower third of the esophagus from 28-34 cm.  Multiple biopsies were performed.   The esophagus was otherwise normal. STOMACH: The mucosa and folds of the stomach appeared normal. DUODENUM: The duodenal mucosa showed no abnormalities in the bulb and second portion of the duodenum.  Retroflexed views revealed a 5 cm hiatal hernia.  The scope was then withdrawn from the patient and the procedure completed.  COMPLICATIONS: There were no complications.  ENDOSCOPIC IMPRESSION: 1.   Barrett's esophagus; multiple biopsies 2.   Moderate sized hiatal hernia  RECOMMENDATIONS: 1.  Await pathology results 2.  Anti-reflux regimen long term 3.  Continue PPI long term 4.  Repeat EGD in 3 years if no dysplasia   eSigned:  Meryl Dare, MD, [facility] 04/15/2013 9:37 AM

## 2013-04-18 ENCOUNTER — Telehealth: Payer: Self-pay

## 2013-04-18 NOTE — Telephone Encounter (Signed)
Left message on answering machine. 

## 2013-04-19 ENCOUNTER — Ambulatory Visit (INDEPENDENT_AMBULATORY_CARE_PROVIDER_SITE_OTHER): Payer: Medicare Other | Admitting: Cardiology

## 2013-04-19 ENCOUNTER — Encounter: Payer: Self-pay | Admitting: Gastroenterology

## 2013-04-19 ENCOUNTER — Encounter: Payer: Self-pay | Admitting: Cardiology

## 2013-04-19 VITALS — BP 122/68 | HR 52 | Ht 66.0 in | Wt 180.0 lb

## 2013-04-19 DIAGNOSIS — I259 Chronic ischemic heart disease, unspecified: Secondary | ICD-10-CM

## 2013-04-19 NOTE — Patient Instructions (Addendum)
The current medical regimen is effective;  continue present plan and medications.  Follow up in 1 year with Dr Hochrein.  You will receive a letter in the mail 2 months before you are due.  Please call us when you receive this letter to schedule your follow up appointment.  

## 2013-04-19 NOTE — Progress Notes (Signed)
HPI The patient presents for follow up of his CAD. I Since I last saw him he has done well.  The patient denies any new symptoms such as chest discomfort, neck or arm discomfort. There has been no new shortness of breath, PND or orthopnea. There have been no reported palpitations, presyncope or syncope. He exercises about 3 times per week without any symptoms. He has had no symptoms since stress perfusion study in 2013.   No Known Allergies  Current Outpatient Prescriptions  Medication Sig Dispense Refill  . aspirin 81 MG EC tablet Take 81 mg by mouth daily.        Marland Kitchen atenolol (TENORMIN) 50 MG tablet Take 1 tablet (50 mg total) by mouth 2 (two) times daily.  200 tablet  3  . atorvastatin (LIPITOR) 20 MG tablet Take 1 tablet (20 mg total) by mouth daily.  100 tablet  3  . clopidogrel (PLAVIX) 75 MG tablet Take 1 tablet (75 mg total) by mouth daily.  100 tablet  3  . Coenzyme Q10 (EQL COQ10) 300 MG CAPS Take by mouth 2 (two) times daily.      . fish oil-omega-3 fatty acids 1000 MG capsule Take 2 g by mouth daily.      . hydrochlorothiazide (HYDRODIURIL) 25 MG tablet Take 1 tablet (25 mg total) by mouth daily.  100 tablet  3  . lisinopril (PRINIVIL,ZESTRIL) 20 MG tablet Take 1 tablet (20 mg total) by mouth daily.  100 tablet  3  . Multiple Vitamin (MULTIVITAMIN) capsule Take 1 capsule by mouth daily. Silver men over  50      . nitroGLYCERIN (NITROSTAT) 0.4 MG SL tablet Place 1 tablet (0.4 mg total) under the tongue every 5 (five) minutes as needed.  25 tablet  1  . pantoprazole (PROTONIX) 40 MG tablet Take 1 tablet (40 mg total) by mouth daily.  100 tablet  3  . potassium chloride (K-DUR) 10 MEQ tablet Take 10 mEq by mouth daily.      . sildenafil (VIAGRA) 100 MG tablet Take 1 tablet (100 mg total) by mouth as needed for erectile dysfunction.  6 tablet  11  . verapamil (CALAN-SR) 180 MG CR tablet Take 1 tablet (180 mg total) by mouth at bedtime.  100 tablet  3   No current facility-administered  medications for this visit.    Past Medical History  Diagnosis Date  . Hypertension   . Hyperlipidemia   . ED (erectile dysfunction)   . CAD (coronary artery disease)     2002 stenting of the RCA,, 2006 Stenting of diag with DES, PTCA of Circ, 50% LAD  . Barrett's esophagus   . Diverticulosis   . GERD (gastroesophageal reflux disease)   . Hemorrhoids   . Duodenal ulcer   . DJD (degenerative joint disease)   . Blood transfusion without reported diagnosis   . Myocardial infarction     Past Surgical History  Procedure Laterality Date  . Spinal fusion    . Spinal fusion    . Spine surgery      cervical    ROS   As stated in the HPI and negative for all other systems.  PHYSICAL EXAM BP 122/68  Pulse 52  Ht 5\' 6"  (1.676 m)  Wt 180 lb (81.647 kg)  BMI 29.07 kg/m2 GENERAL:  Well appearing HEENT:  Pupils equal round and reactive, fundi not visualized, oral mucosa unremarkable NECK:  No jugular venous distention, waveform within normal limits, carotid upstroke brisk  and symmetric, no bruits, no thyromegaly LUNGS:  Clear to auscultation bilaterally BACK:  No CVA tenderness CHEST:  Unremarkable HEART:  PMI not displaced or sustained,S1 and S2 within normal limits, no S3, no S4, no clicks, no rubs, no murmurs ABD:  Flat, positive bowel sounds normal in frequency in pitch, no bruits, no rebound, no guarding, no midline pulsatile mass, no hepatomegaly, no splenomegaly EXT:  2 plus pulses throughout, no edema, no cyanosis no clubbing   EKG:  Sinus bradycardia, rate 52, axis within normal limits, intervals WNL, no acute ST T wave changes.  04/19/2013  ASSESSMENT AND PLAN  CORONARY ARTERY DISEASE, S/P PTCA -  The patient has no new sypmtoms.  No further cardiovascular testing is indicated.  We will continue with aggressive risk reduction and meds as listed.  Of note he will stay on dual antiplatelet therapy given the excessive his previous small vessel disease  HYPERLIPIDEMIA -    His LDL was 98 with an HDL of 16.1. I will continue him on the same therapy.   HYPERTENSION -  The blood pressure is at target. No change in medications is indicated. We will continue with therapeutic lifestyle changes (TLC).

## 2013-05-02 ENCOUNTER — Ambulatory Visit (INDEPENDENT_AMBULATORY_CARE_PROVIDER_SITE_OTHER): Payer: Medicare Other | Admitting: Family Medicine

## 2013-05-02 ENCOUNTER — Encounter: Payer: Self-pay | Admitting: Family Medicine

## 2013-05-02 VITALS — BP 128/70 | HR 56 | Temp 98.4°F | Wt 186.0 lb

## 2013-05-02 DIAGNOSIS — R319 Hematuria, unspecified: Secondary | ICD-10-CM

## 2013-05-02 DIAGNOSIS — R109 Unspecified abdominal pain: Secondary | ICD-10-CM

## 2013-05-02 DIAGNOSIS — R1030 Lower abdominal pain, unspecified: Secondary | ICD-10-CM

## 2013-05-02 DIAGNOSIS — R1032 Left lower quadrant pain: Secondary | ICD-10-CM | POA: Diagnosis not present

## 2013-05-02 LAB — POCT URINALYSIS DIPSTICK
Bilirubin, UA: NEGATIVE
Leukocytes, UA: NEGATIVE
Nitrite, UA: NEGATIVE
Protein, UA: NEGATIVE
pH, UA: 6

## 2013-05-02 MED ORDER — CIPROFLOXACIN HCL 500 MG PO TABS: 500.0000 mg | ORAL_TABLET | Freq: Two times a day (BID) | ORAL | Status: DC

## 2013-05-02 NOTE — Progress Notes (Signed)
  Subjective:    Patient ID: [name], male    DOB: [birth year], 72 y.o.   MRN: [id number]  HPI  Acute visit for suprapubic pain Onset last Friday.  Some urine frequency but no burning. No obstructive symptoms.  No fever or chills.   Hx of diverticular disease.  Some of his pain is LLQ. No nausea or vomiting.  He describes as dull ache with mild severity. No alleviating or exacerbating factors.    Past hx of w/u for hematuria about 10 years ago negative. No gross hematuria.  No hx of kidney stones.  Past Medical History  Diagnosis Date  . Hypertension   . Hyperlipidemia   . ED (erectile dysfunction)   . CAD (coronary artery disease)     2002 stenting of the RCA,, 2006 Stenting of diag with DES, PTCA of Circ, 50% LAD  . Barrett's esophagus   . Diverticulosis   . GERD (gastroesophageal reflux disease)   . Hemorrhoids   . Duodenal ulcer   . DJD (degenerative joint disease)   . Blood transfusion without reported diagnosis   . Myocardial infarction    Past Surgical History  Procedure Laterality Date  . Spinal fusion    . Spinal fusion    . Spine surgery      cervical    reports that he quit smoking about 51 years ago. He has never used smokeless tobacco. He reports that  drinks alcohol. He reports that he does not use illicit drugs. family history includes Alcohol abuse in an other family member; Alzheimer's disease in an other family member; Coronary artery disease in an other family member. No Known Allergies   Review of Systems  Constitutional: Negative for fever, chills, appetite change and unexpected weight change.  Respiratory: Negative for shortness of breath.   Cardiovascular: Negative for chest pain.  Gastrointestinal: Positive for abdominal pain. Negative for nausea, vomiting, diarrhea and abdominal distention.  Endocrine: Negative for polydipsia and polyuria.  Genitourinary: Positive for frequency. Negative for hematuria, flank pain, decreased urine  volume and difficulty urinating.  Neurological: Negative for dizziness.       Objective:   Physical Exam  Constitutional: He appears well-developed and well-nourished.  Cardiovascular: Normal rate and regular rhythm.   Pulmonary/Chest: Effort normal and breath sounds normal. No respiratory distress. He has no wheezes. He has no rales.  Abdominal: Soft. Bowel sounds are normal. He exhibits no distension and no mass. There is tenderness. There is no rebound and no guarding.  Mildly tender LLQ with no guarding or rebound  Neurological: He is alert.          Assessment & Plan:  Abdominal pain LLQ--?acute diverticulitis.  Cipro 500 mg po bid for 10 days. Add Flagyl if not improving over next few days. Hematuria. Previous w/u negative but over 10 years ago.  Send urine micro. Urine does not appear infected.

## 2013-05-03 LAB — URINALYSIS, MICROSCOPIC ONLY
Bacteria, UA: NONE SEEN
Casts: NONE SEEN

## 2013-05-04 DIAGNOSIS — M1710 Unilateral primary osteoarthritis, unspecified knee: Secondary | ICD-10-CM | POA: Diagnosis not present

## 2013-05-04 NOTE — Addendum Note (Signed)
Addended by: Thomasena Edis on: 05/04/2013 12:21 PM   Modules accepted: Orders

## 2013-05-11 DIAGNOSIS — M1710 Unilateral primary osteoarthritis, unspecified knee: Secondary | ICD-10-CM | POA: Diagnosis not present

## 2013-05-18 DIAGNOSIS — M1710 Unilateral primary osteoarthritis, unspecified knee: Secondary | ICD-10-CM | POA: Diagnosis not present

## 2013-05-23 ENCOUNTER — Other Ambulatory Visit (INDEPENDENT_AMBULATORY_CARE_PROVIDER_SITE_OTHER): Payer: Medicare Other

## 2013-05-23 ENCOUNTER — Telehealth: Payer: Self-pay

## 2013-05-23 DIAGNOSIS — R319 Hematuria, unspecified: Secondary | ICD-10-CM

## 2013-05-23 LAB — POCT URINALYSIS DIPSTICK
Bilirubin, UA: NEGATIVE
Glucose, UA: NEGATIVE
Ketones, UA: NEGATIVE
Leukocytes, UA: NEGATIVE
Nitrite, UA: NEGATIVE

## 2013-05-23 NOTE — Telephone Encounter (Signed)
Pt is aware of labs results and is okay with getting set up with [facility].

## 2013-05-24 NOTE — Telephone Encounter (Signed)
Set up with [facility], Dx hematuria.

## 2013-05-25 NOTE — Telephone Encounter (Signed)
Referral is ordered

## 2013-06-29 DIAGNOSIS — N529 Male erectile dysfunction, unspecified: Secondary | ICD-10-CM | POA: Diagnosis not present

## 2013-06-29 DIAGNOSIS — R3129 Other microscopic hematuria: Secondary | ICD-10-CM | POA: Diagnosis not present

## 2013-07-08 DIAGNOSIS — R3129 Other microscopic hematuria: Secondary | ICD-10-CM | POA: Diagnosis not present

## 2013-07-13 DIAGNOSIS — N529 Male erectile dysfunction, unspecified: Secondary | ICD-10-CM | POA: Diagnosis not present

## 2013-07-13 DIAGNOSIS — R3129 Other microscopic hematuria: Secondary | ICD-10-CM | POA: Diagnosis not present

## 2013-07-20 ENCOUNTER — Ambulatory Visit (INDEPENDENT_AMBULATORY_CARE_PROVIDER_SITE_OTHER): Payer: Medicare Other

## 2013-07-20 DIAGNOSIS — Z23 Encounter for immunization: Secondary | ICD-10-CM | POA: Diagnosis not present

## 2013-09-05 DIAGNOSIS — M19049 Primary osteoarthritis, unspecified hand: Secondary | ICD-10-CM | POA: Diagnosis not present

## 2013-09-14 ENCOUNTER — Ambulatory Visit (INDEPENDENT_AMBULATORY_CARE_PROVIDER_SITE_OTHER): Payer: Medicare Other | Admitting: Family Medicine

## 2013-09-14 ENCOUNTER — Encounter: Payer: Self-pay | Admitting: Family Medicine

## 2013-09-14 VITALS — BP 108/70 | Temp 98.8°F | Wt 180.0 lb

## 2013-09-14 DIAGNOSIS — K5732 Diverticulitis of large intestine without perforation or abscess without bleeding: Secondary | ICD-10-CM

## 2013-09-14 DIAGNOSIS — K5792 Diverticulitis of intestine, part unspecified, without perforation or abscess without bleeding: Secondary | ICD-10-CM

## 2013-09-14 MED ORDER — CIPROFLOXACIN HCL 500 MG PO TABS: 500.0000 mg | ORAL_TABLET | Freq: Two times a day (BID) | ORAL | Status: DC

## 2013-09-14 MED ORDER — METRONIDAZOLE 500 MG PO TABS: 500.0000 mg | ORAL_TABLET | Freq: Three times a day (TID) | ORAL | Status: DC

## 2013-09-14 NOTE — Patient Instructions (Signed)
-  As we discussed, we have prescribed a new medication for you at this appointment. We discussed the common and serious potential adverse effects of this medication and you can review these and more with the pharmacist when you pick up your medication.  Please follow the instructions for use carefully and notify us immediately if you have any problems taking this medication.  -if worsening or not improving see your gastroenterologist

## 2013-09-14 NOTE — Progress Notes (Signed)
Chief Complaint  Patient presents with  . Diverticulitis    HPI:  [name] is a 72 yo M patient of Dr. Sherren Mocha here for an acute visit for:  L side pain/? Diverticulitis: -hx of diverticulitis tx for possible flare last year with cipro, also hx of hematuria and referred to urology in sept 2014 - reports he saw Dr. Hartley Barefoot and is only related to plavix and  -last colonoscopy in 03/2013 with diverticulosis, followed by GI for barrett's -this pai started a few days ago, is mild and feels exactly like previous flares -he prefers to treat empirically and doe snot wish to do CT scan given mild and typical symptoms -does not eat red meat, some fats, not much fiber in diet -denies: fevers, weight loss, blood or mucus in stools, severe constipation, urinary symptoms, vomiting, diarrhea  ROS: See pertinent positives and negatives per HPI.  Past Medical History  Diagnosis Date  . Hypertension   . Hyperlipidemia   . ED (erectile dysfunction)   . CAD (coronary artery disease)     2002 stenting of the RCA,, 2006 Stenting of diag with DES, PTCA of Circ, 50% LAD  . Barrett's esophagus   . Diverticulosis   . GERD (gastroesophageal reflux disease)   . Hemorrhoids   . Duodenal ulcer   . DJD (degenerative joint disease)   . Blood transfusion without reported diagnosis   . Myocardial infarction     Past Surgical History  Procedure Laterality Date  . Spinal fusion    . Spinal fusion    . Spine surgery      cervical    Family History  Problem Relation Age of Onset  . Alcohol abuse    . Coronary artery disease    . Alzheimer's disease      History   Social History  . Marital Status: Married    Spouse Name: N/A    Number of Children: N/A  . Years of Education: N/A   Social History Main Topics  . Smoking status: Former Smoker    Quit date: 08/25/1961  . Smokeless tobacco: Never Used  . Alcohol Use: Yes     Comment: rarely  . Drug Use: No  . Sexual Activity: None    Other Topics Concern  . None   Social History Narrative  . None    Current outpatient prescriptions:aspirin 81 MG EC tablet, Take 81 mg by mouth daily.  , Disp: , Rfl: ;  atenolol (TENORMIN) 50 MG tablet, Take 1 tablet (50 mg total) by mouth 2 (two) times daily., Disp: 200 tablet, Rfl: 3;  atorvastatin (LIPITOR) 20 MG tablet, Take 1 tablet (20 mg total) by mouth daily., Disp: 100 tablet, Rfl: 3;  ciprofloxacin (CIPRO) 500 MG tablet, Take 1 tablet (500 mg total) by mouth 2 (two) times daily., Disp: 20 tablet, Rfl: 0 clopidogrel (PLAVIX) 75 MG tablet, Take 1 tablet (75 mg total) by mouth daily., Disp: 100 tablet, Rfl: 3;  Coenzyme Q10 (EQL COQ10) 300 MG CAPS, Take by mouth 2 (two) times daily., Disp: , Rfl: ;  fish oil-omega-3 fatty acids 1000 MG capsule, Take 2 g by mouth daily., Disp: , Rfl: ;  hydrochlorothiazide (HYDRODIURIL) 25 MG tablet, Take 1 tablet (25 mg total) by mouth daily., Disp: 100 tablet, Rfl: 3 lisinopril (PRINIVIL,ZESTRIL) 20 MG tablet, Take 1 tablet (20 mg total) by mouth daily., Disp: 100 tablet, Rfl: 3;  Multiple Vitamin (MULTIVITAMIN) capsule, Take 1 capsule by mouth daily. Silver men over  64,  Disp: , Rfl: ;  nitroGLYCERIN (NITROSTAT) 0.4 MG SL tablet, Place 1 tablet (0.4 mg total) under the tongue every 5 (five) minutes as needed., Disp: 25 tablet, Rfl: 1 pantoprazole (PROTONIX) 40 MG tablet, Take 1 tablet (40 mg total) by mouth daily., Disp: 100 tablet, Rfl: 3;  potassium chloride (K-DUR) 10 MEQ tablet, Take 10 mEq by mouth daily., Disp: , Rfl: ;  sildenafil (VIAGRA) 100 MG tablet, Take 1 tablet (100 mg total) by mouth as needed for erectile dysfunction., Disp: 6 tablet, Rfl: 11;  verapamil (CALAN-SR) 180 MG CR tablet, Take 1 tablet (180 mg total) by mouth at bedtime., Disp: 100 tablet, Rfl: 3 ciprofloxacin (CIPRO) 500 MG tablet, Take 1 tablet (500 mg total) by mouth 2 (two) times daily., Disp: 28 tablet, Rfl: 0;  metroNIDAZOLE (FLAGYL) 500 MG tablet, Take 1 tablet (500 mg  total) by mouth 3 (three) times daily., Disp: 21 tablet, Rfl: 0  EXAM:  Filed Vitals:   09/14/13 1041  BP: 108/70  Temp: 98.8 F (37.1 C)    Body mass index is 29.07 kg/(m^2).  GENERAL: vitals reviewed and listed above, alert, oriented, appears well hydrated and in no acute distress  HEENT: atraumatic, conjunttiva clear, no obvious abnormalities on inspection of external nose and ears  NECK: no obvious masses on inspection  LUNGS: clear to auscultation bilaterally, no wheezes, rales or rhonchi, good air movement  CV: HRRR, no peripheral edema  ABD: BS+, soft, mild LLQ TTP  MS: moves all extremities without noticeable abnormality  PSYCH: pleasant and cooperative, no obvious depression or anxiety  ASSESSMENT AND PLAN:  Discussed the following assessment and plan:  Diverticulitis - Plan: ciprofloxacin (CIPRO) 500 MG tablet, metroNIDAZOLE (FLAGYL) 500 MG tablet  -we discussed possible serious and likely etiologies, workup and treatment, treatment risks and return precautions -after this discussion, Dessie opted for empiric tx of likely mild diverticulitis, discussed current evidence on dietary recs repated to diverticular disease -follow up advised with his gastroenterologist if needed -of course, we advised Daveyon  to return or notify a doctor immediately if symptoms worsen or persist or new concerns arise.  -Patient advised to return or notify a doctor immediately if symptoms worsen or persist or new concerns arise.  Patient Instructions  -As we discussed, we have prescribed a new medication for you at this appointment. We discussed the common and serious potential adverse effects of this medication and you can review these and more with the pharmacist when you pick up your medication.  Please follow the instructions for use carefully and notify us immediately if you have any problems taking this medication.  -if worsening or not improving see your  gastroenterologist     Lucretia Kern.

## 2013-09-14 NOTE — Progress Notes (Signed)
Pre visit review using our clinic review tool, if applicable. No additional management support is needed unless otherwise documented below in the visit note. 

## 2013-10-25 DIAGNOSIS — L821 Other seborrheic keratosis: Secondary | ICD-10-CM | POA: Diagnosis not present

## 2013-10-25 DIAGNOSIS — L988 Other specified disorders of the skin and subcutaneous tissue: Secondary | ICD-10-CM | POA: Diagnosis not present

## 2013-10-25 DIAGNOSIS — D485 Neoplasm of uncertain behavior of skin: Secondary | ICD-10-CM | POA: Diagnosis not present

## 2013-10-25 DIAGNOSIS — D239 Other benign neoplasm of skin, unspecified: Secondary | ICD-10-CM | POA: Diagnosis not present

## 2014-01-02 ENCOUNTER — Other Ambulatory Visit: Payer: Self-pay | Admitting: Family Medicine

## 2014-01-07 ENCOUNTER — Other Ambulatory Visit: Payer: Self-pay | Admitting: Family Medicine

## 2014-01-18 ENCOUNTER — Encounter: Payer: Self-pay | Admitting: Family Medicine

## 2014-01-18 ENCOUNTER — Ambulatory Visit (INDEPENDENT_AMBULATORY_CARE_PROVIDER_SITE_OTHER): Payer: Medicare Other | Admitting: Family Medicine

## 2014-01-18 VITALS — BP 124/80 | Temp 97.7°F | Ht 64.75 in | Wt 180.0 lb

## 2014-01-18 DIAGNOSIS — I2510 Atherosclerotic heart disease of native coronary artery without angina pectoris: Secondary | ICD-10-CM | POA: Diagnosis not present

## 2014-01-18 DIAGNOSIS — Z87448 Personal history of other diseases of urinary system: Secondary | ICD-10-CM

## 2014-01-18 DIAGNOSIS — K2270 Barrett's esophagus without dysplasia: Secondary | ICD-10-CM

## 2014-01-18 DIAGNOSIS — IMO0002 Reserved for concepts with insufficient information to code with codable children: Secondary | ICD-10-CM

## 2014-01-18 DIAGNOSIS — N138 Other obstructive and reflux uropathy: Secondary | ICD-10-CM

## 2014-01-18 DIAGNOSIS — N401 Enlarged prostate with lower urinary tract symptoms: Secondary | ICD-10-CM

## 2014-01-18 DIAGNOSIS — I259 Chronic ischemic heart disease, unspecified: Secondary | ICD-10-CM

## 2014-01-18 DIAGNOSIS — Z23 Encounter for immunization: Secondary | ICD-10-CM

## 2014-01-18 DIAGNOSIS — E785 Hyperlipidemia, unspecified: Secondary | ICD-10-CM | POA: Diagnosis not present

## 2014-01-18 DIAGNOSIS — M1710 Unilateral primary osteoarthritis, unspecified knee: Secondary | ICD-10-CM | POA: Diagnosis not present

## 2014-01-18 DIAGNOSIS — I10 Essential (primary) hypertension: Secondary | ICD-10-CM

## 2014-01-18 DIAGNOSIS — F528 Other sexual dysfunction not due to a substance or known physiological condition: Secondary | ICD-10-CM

## 2014-01-18 LAB — CBC WITH DIFFERENTIAL/PLATELET
BASOS ABS: 0 10*3/uL (ref 0.0–0.1)
BASOS PCT: 0.5 % (ref 0.0–3.0)
EOS PCT: 2.7 % (ref 0.0–5.0)
Eosinophils Absolute: 0.2 10*3/uL (ref 0.0–0.7)
HEMATOCRIT: 43.3 % (ref 39.0–52.0)
Hemoglobin: 14.7 g/dL (ref 13.0–17.0)
LYMPHS ABS: 3.3 10*3/uL (ref 0.7–4.0)
LYMPHS PCT: 37.1 % (ref 12.0–46.0)
MCHC: 33.9 g/dL (ref 30.0–36.0)
MCV: 93.9 fl (ref 78.0–100.0)
MONOS PCT: 9.3 % (ref 3.0–12.0)
Monocytes Absolute: 0.8 10*3/uL (ref 0.1–1.0)
NEUTROS ABS: 4.4 10*3/uL (ref 1.4–7.7)
Neutrophils Relative %: 50.4 % (ref 43.0–77.0)
Platelets: 227 10*3/uL (ref 150.0–400.0)
RBC: 4.62 Mil/uL (ref 4.22–5.81)
RDW: 13.5 % (ref 11.5–15.5)
WBC: 8.8 10*3/uL (ref 4.0–10.5)

## 2014-01-18 LAB — LIPID PANEL
Cholesterol: 130 mg/dL (ref 0–200)
HDL: 45.2 mg/dL (ref >39.00)
LDL CALC: 63 mg/dL (ref 0–99)
Total CHOL/HDL Ratio: 3
Triglycerides: 108 mg/dL (ref 0.0–149.0)
VLDL: 21.6 mg/dL (ref 0.0–40.0)

## 2014-01-18 LAB — BASIC METABOLIC PANEL
BUN: 20 mg/dL (ref 6–23)
CHLORIDE: 102 meq/L (ref 96–112)
CO2: 29 mEq/L (ref 19–32)
Calcium: 9.9 mg/dL (ref 8.4–10.5)
Creatinine, Ser: 1.3 mg/dL (ref 0.4–1.5)
GFR: 55.66 mL/min — AB (ref >60.00)
GLUCOSE: 88 mg/dL (ref 70–99)
POTASSIUM: 4.8 meq/L (ref 3.5–5.1)
Sodium: 139 mEq/L (ref 135–145)

## 2014-01-18 LAB — HEPATIC FUNCTION PANEL
ALT: 22 U/L (ref 0–53)
AST: 23 U/L (ref 0–37)
Albumin: 4.1 g/dL (ref 3.5–5.2)
Alkaline Phosphatase: 61 U/L (ref 39–117)
Bilirubin, Direct: 0.1 mg/dL (ref 0.0–0.3)
TOTAL PROTEIN: 6.8 g/dL (ref 6.0–8.3)
Total Bilirubin: 0.8 mg/dL (ref 0.2–1.2)

## 2014-01-18 LAB — POCT URINALYSIS DIPSTICK
BILIRUBIN UA: NEGATIVE
GLUCOSE UA: NEGATIVE
KETONES UA: NEGATIVE
Leukocytes, UA: NEGATIVE
Nitrite, UA: NEGATIVE
PH UA: 7
Protein, UA: NEGATIVE
Spec Grav, UA: 1.01
Urobilinogen, UA: 0.2

## 2014-01-18 LAB — TSH: TSH: 0.69 u[IU]/mL (ref 0.35–4.50)

## 2014-01-18 LAB — PSA: PSA: 1.93 ng/mL (ref 0.10–4.00)

## 2014-01-18 MED ORDER — NITROGLYCERIN 0.4 MG SL SUBL: 0.4000 mg | SUBLINGUAL_TABLET | SUBLINGUAL | Status: DC | PRN

## 2014-01-18 MED ORDER — ATENOLOL 50 MG PO TABS: ORAL_TABLET | ORAL | Status: DC

## 2014-01-18 MED ORDER — PANTOPRAZOLE SODIUM 40 MG PO TBEC: DELAYED_RELEASE_TABLET | ORAL | Status: DC

## 2014-01-18 MED ORDER — HYDROCHLOROTHIAZIDE 25 MG PO TABS: ORAL_TABLET | ORAL | Status: DC

## 2014-01-18 MED ORDER — VERAPAMIL HCL ER 180 MG PO TBCR: EXTENDED_RELEASE_TABLET | ORAL | Status: DC

## 2014-01-18 MED ORDER — CLOPIDOGREL BISULFATE 75 MG PO TABS: ORAL_TABLET | ORAL | Status: DC

## 2014-01-18 MED ORDER — ATORVASTATIN CALCIUM 20 MG PO TABS: 20.0000 mg | ORAL_TABLET | Freq: Every day | ORAL | Status: DC

## 2014-01-18 MED ORDER — LISINOPRIL 20 MG PO TABS: ORAL_TABLET | ORAL | Status: DC

## 2014-01-18 MED ORDER — POTASSIUM CHLORIDE ER 10 MEQ PO TBCR: 10.0000 meq | EXTENDED_RELEASE_TABLET | Freq: Once | ORAL | Status: DC

## 2014-01-18 NOTE — Patient Instructions (Signed)
Continue current medicine and good health habits  Followup in 1 year sooner if any problem

## 2014-01-18 NOTE — Progress Notes (Signed)
Pre visit review using our clinic review tool, if applicable. No additional management support is needed unless otherwise documented below in the visit note. 

## 2014-01-18 NOTE — Progress Notes (Signed)
   Subjective:    Patient ID: [name], male    DOB: [birth year], 73 y.o.   MRN: [id number]  HPI [name] is a 73 year old married male nonsmoker who comes in today for annual checkup because of a history of hypertension, hyperlipidemia, coronary disease status post stents, reflux esophagitis with a history of Barrett's esophagus,  His med list reviewed there've been no changes except he's getting his Viagra from Dr. Gaynelle Arabian the urologist  He gets routine eye care, dental care, colonoscopy and followup by Dr. Fuller Plan. Also had an endoscopy last year because a history of Barrett's esophagus it was normal.  Cognitive function normal he walks on a regular basis home health safety reviewed no issues identified, no guns in the house, he does have a health care power of attorney and living well  Vaccinations updated by [facility]  Cardiac evaluation this year by Dr. Maylon Cos normal   Review of Systems  Constitutional: Negative.   HENT: Negative.   Eyes: Negative.   Respiratory: Negative.   Cardiovascular: Negative.   Gastrointestinal: Negative.   Genitourinary: Negative.   Musculoskeletal: Negative.   Skin: Negative.   Neurological: Negative.   Psychiatric/Behavioral: Negative.        Objective:   Physical Exam  Nursing note and vitals reviewed. Constitutional: He is oriented to person, place, and time. He appears well-developed and well-nourished.  HENT:  Head: Normocephalic and atraumatic.  Right Ear: External ear normal.  Left Ear: External ear normal.  Nose: Nose normal.  Mouth/Throat: Oropharynx is clear and moist.  Eyes: Conjunctivae and EOM are normal. Pupils are equal, round, and reactive to light.  Neck: Normal range of motion. Neck supple. No JVD present. No tracheal deviation present. No thyromegaly present.  Cardiovascular: Normal rate, regular rhythm, normal heart sounds and intact distal pulses.  Exam reveals no gallop and no friction rub.   No murmur  heard. Neuritic bruits peripheral pulses 2+ and symmetrical  Pulmonary/Chest: Effort normal and breath sounds normal. No stridor. No respiratory distress. He has no wheezes. He has no rales. He exhibits no tenderness.  Abdominal: Soft. Bowel sounds are normal. He exhibits no distension and no mass. There is no tenderness. There is no rebound and no guarding.  Genitourinary:  Urologic evaluation yearly therefore deferred to them  Musculoskeletal: Normal range of motion. He exhibits no edema and no tenderness.  Lymphadenopathy:    He has no cervical adenopathy.  Neurological: He is alert and oriented to person, place, and time. He has normal reflexes. No cranial nerve deficit. He exhibits normal muscle tone.  Skin: Skin is warm and dry. No rash noted. No erythema. No pallor.  Total body skin exam normal  Psychiatric: He has a normal mood and affect. His behavior is normal. Judgment and thought content normal.          Assessment & Plan:  Healthy male  Hypertension ago continue current therapy  Hyperlipidemia check labs  Coronary disease asymptomatic  History reflux esophagitis on Protonix 40 mg daily  Erectile dysfunction Viagra when necessary followed by urology

## 2014-03-10 DIAGNOSIS — H2510 Age-related nuclear cataract, unspecified eye: Secondary | ICD-10-CM | POA: Diagnosis not present

## 2014-03-10 DIAGNOSIS — H3530 Unspecified macular degeneration: Secondary | ICD-10-CM | POA: Diagnosis not present

## 2014-03-10 DIAGNOSIS — H04129 Dry eye syndrome of unspecified lacrimal gland: Secondary | ICD-10-CM | POA: Diagnosis not present

## 2014-03-10 DIAGNOSIS — H18419 Arcus senilis, unspecified eye: Secondary | ICD-10-CM | POA: Diagnosis not present

## 2014-04-24 ENCOUNTER — Ambulatory Visit (INDEPENDENT_AMBULATORY_CARE_PROVIDER_SITE_OTHER): Payer: Medicare Other | Admitting: Cardiology

## 2014-04-24 ENCOUNTER — Encounter: Payer: Self-pay | Admitting: Cardiology

## 2014-04-24 VITALS — BP 114/70 | HR 70 | Ht 66.0 in | Wt 178.0 lb

## 2014-04-24 DIAGNOSIS — I2510 Atherosclerotic heart disease of native coronary artery without angina pectoris: Secondary | ICD-10-CM | POA: Diagnosis not present

## 2014-04-24 DIAGNOSIS — I10 Essential (primary) hypertension: Secondary | ICD-10-CM | POA: Diagnosis not present

## 2014-04-24 DIAGNOSIS — I158 Other secondary hypertension: Secondary | ICD-10-CM | POA: Diagnosis not present

## 2014-04-24 DIAGNOSIS — I259 Chronic ischemic heart disease, unspecified: Secondary | ICD-10-CM

## 2014-04-24 NOTE — Progress Notes (Signed)
HPI The patient presents for follow up of his CAD. I Since I last saw him he has done well.  The patient denies any new symptoms such as chest discomfort, neck or arm discomfort. There has been no new shortness of breath, PND or orthopnea. There have been no reported palpitations, presyncope or syncope. He exercises about 3 times per week 30 minutes at a time without any symptoms.    No Known Allergies  Current Outpatient Prescriptions  Medication Sig Dispense Refill  . ramipril (ALTACE) 10 MG capsule Take 10 mg by mouth daily.      Marland Kitchen aspirin 81 MG EC tablet Take 81 mg by mouth daily.        Marland Kitchen atenolol (TENORMIN) 50 MG tablet TAKE 1 TABLET BY MOUTH TWICE DAILY  200 tablet  3  . atorvastatin (LIPITOR) 20 MG tablet Take 1 tablet (20 mg total) by mouth daily.  100 tablet  3  . clopidogrel (PLAVIX) 75 MG tablet TAKE 1 TABLET BY MOUTH DAILY  100 tablet  3  . Coenzyme Q10 (EQL COQ10) 300 MG CAPS Take by mouth 2 (two) times daily.      . fish oil-omega-3 fatty acids 1000 MG capsule Take 2 g by mouth daily.      . hydrochlorothiazide (HYDRODIURIL) 25 MG tablet TAKE 1 TABLET BY MOUTH DAILY  100 tablet  3  . lisinopril (PRINIVIL,ZESTRIL) 20 MG tablet TAKE 1 TABLET BY MOUTH DAILY  100 tablet  3  . Multiple Vitamin (MULTIVITAMIN) capsule Take 1 capsule by mouth daily. Silver men over  50      . nitroGLYCERIN (NITROSTAT) 0.4 MG SL tablet Place 1 tablet (0.4 mg total) under the tongue every 5 (five) minutes as needed.  25 tablet  1  . pantoprazole (PROTONIX) 40 MG tablet TAKE 1 TABLET BY MOUTH DAILY  100 tablet  3  . potassium chloride (K-DUR) 10 MEQ tablet Take 1 tablet (10 mEq total) by mouth once.  100 tablet  3  . sildenafil (VIAGRA) 100 MG tablet Take 1 tablet (100 mg total) by mouth as needed for erectile dysfunction.  6 tablet  11  . verapamil (CALAN-SR) 180 MG CR tablet TAKE 1 TABLET BY MOUTH EVERY NIGHT AT BEDTIME  100 tablet  3   No current facility-administered medications for this visit.      Past Medical History  Diagnosis Date  . Hypertension   . Hyperlipidemia   . ED (erectile dysfunction)   . CAD (coronary artery disease)     2002 stenting of the RCA,, 2006 Stenting of diag with DES, PTCA of Circ, 50% LAD  . Barrett's esophagus   . Diverticulosis   . GERD (gastroesophageal reflux disease)   . Hemorrhoids   . Duodenal ulcer   . DJD (degenerative joint disease)   . Blood transfusion without reported diagnosis   . Myocardial infarction     Past Surgical History  Procedure Laterality Date  . Spinal fusion    . Spinal fusion    . Spine surgery      cervical    ROS   As stated in the HPI and negative for all other systems.  PHYSICAL EXAM BP 114/70  Pulse 70  Ht 5\' 6"  (1.676 m)  Wt 178 lb (80.74 kg)  BMI 28.74 kg/m2 GENERAL:  Well appearing HEENT:  Pupils equal round and reactive, fundi not visualized, oral mucosa unremarkable NECK:  No jugular venous distention, waveform within normal limits, carotid upstroke brisk  and symmetric, no bruits, no thyromegaly LUNGS:  Clear to auscultation bilaterally BACK:  No CVA tenderness CHEST:  Unremarkable HEART:  PMI not displaced or sustained,S1 and S2 within normal limits, no S3, no S4, no clicks, no rubs, no murmurs ABD:  Flat, positive bowel sounds normal in frequency in pitch, no bruits, no rebound, no guarding, no midline pulsatile mass, no hepatomegaly, no splenomegaly EXT:  2 plus pulses throughout, no edema, no cyanosis no clubbing   EKG:  Sinus bradycardia, rate 54, axis within normal limits, intervals WNL, no acute ST T wave changes.  04/24/2014  ASSESSMENT AND PLAN  CORONARY ARTERY DISEASE, S/P PTCA -  The patient has no new sypmtoms since stress testing in 2013.  No further cardiovascular testing is indicated.  We will continue with aggressive risk reduction and meds as listed.  Of note he will stay on dual antiplatelet therapy given the excessive his previous small vessel disease.  We do continue to  discuss the risk benefits of this.   HYPERLIPIDEMIA -  His LDL was 63.   I will continue him on the same therapy.   HYPERTENSION -  The blood pressure is at target. No change in medications is indicated. We will continue with therapeutic lifestyle changes (TLC).

## 2014-04-24 NOTE — Patient Instructions (Signed)
Your physician recommends that you schedule a follow-up appointment in:  One year with Dr. Hochrein  

## 2014-06-09 ENCOUNTER — Other Ambulatory Visit: Payer: Self-pay

## 2014-06-23 ENCOUNTER — Ambulatory Visit (INDEPENDENT_AMBULATORY_CARE_PROVIDER_SITE_OTHER): Payer: Medicare Other

## 2014-06-23 DIAGNOSIS — Z23 Encounter for immunization: Secondary | ICD-10-CM | POA: Diagnosis not present

## 2014-08-23 ENCOUNTER — Other Ambulatory Visit: Payer: Self-pay | Admitting: Family Medicine

## 2015-01-23 ENCOUNTER — Ambulatory Visit (INDEPENDENT_AMBULATORY_CARE_PROVIDER_SITE_OTHER): Payer: Medicare Other | Admitting: Family Medicine

## 2015-01-23 ENCOUNTER — Encounter: Payer: Self-pay | Admitting: Family Medicine

## 2015-01-23 VITALS — BP 110/80 | Temp 97.8°F | Ht 64.5 in | Wt 181.0 lb

## 2015-01-23 DIAGNOSIS — I10 Essential (primary) hypertension: Secondary | ICD-10-CM | POA: Diagnosis not present

## 2015-01-23 DIAGNOSIS — I259 Chronic ischemic heart disease, unspecified: Secondary | ICD-10-CM

## 2015-01-23 DIAGNOSIS — E785 Hyperlipidemia, unspecified: Secondary | ICD-10-CM

## 2015-01-23 DIAGNOSIS — N401 Enlarged prostate with lower urinary tract symptoms: Secondary | ICD-10-CM | POA: Diagnosis not present

## 2015-01-23 DIAGNOSIS — N138 Other obstructive and reflux uropathy: Secondary | ICD-10-CM

## 2015-01-23 DIAGNOSIS — Z0000 Encounter for general adult medical examination without abnormal findings: Secondary | ICD-10-CM

## 2015-01-23 LAB — POCT URINALYSIS DIPSTICK
BILIRUBIN UA: NEGATIVE
GLUCOSE UA: NEGATIVE
KETONES UA: NEGATIVE
LEUKOCYTES UA: NEGATIVE
Nitrite, UA: NEGATIVE
Protein, UA: NEGATIVE
Spec Grav, UA: 1.01
Urobilinogen, UA: 0.2
pH, UA: 6.5

## 2015-01-23 LAB — CBC WITH DIFFERENTIAL/PLATELET
Basophils Absolute: 0 10*3/uL (ref 0.0–0.1)
Basophils Relative: 0.5 % (ref 0.0–3.0)
EOS ABS: 0.3 10*3/uL (ref 0.0–0.7)
Eosinophils Relative: 2.8 % (ref 0.0–5.0)
HEMATOCRIT: 42.4 % (ref 39.0–52.0)
Hemoglobin: 14.3 g/dL (ref 13.0–17.0)
LYMPHS ABS: 3.5 10*3/uL (ref 0.7–4.0)
Lymphocytes Relative: 35.6 % (ref 12.0–46.0)
MCHC: 33.8 g/dL (ref 30.0–36.0)
MCV: 94.8 fl (ref 78.0–100.0)
MONO ABS: 1 10*3/uL (ref 0.1–1.0)
Monocytes Relative: 9.9 % (ref 3.0–12.0)
NEUTROS PCT: 51.2 % (ref 43.0–77.0)
Neutro Abs: 5 10*3/uL (ref 1.4–7.7)
PLATELETS: 263 10*3/uL (ref 150.0–400.0)
RBC: 4.48 Mil/uL (ref 4.22–5.81)
RDW: 13.7 % (ref 11.5–15.5)
WBC: 9.7 10*3/uL (ref 4.0–10.5)

## 2015-01-23 LAB — LIPID PANEL
CHOL/HDL RATIO: 3
CHOLESTEROL: 135 mg/dL (ref 0–200)
HDL: 46.4 mg/dL (ref >39.00)
LDL CALC: 63 mg/dL (ref 0–99)
NonHDL: 88.6
Triglycerides: 129 mg/dL (ref 0.0–149.0)
VLDL: 25.8 mg/dL (ref 0.0–40.0)

## 2015-01-23 LAB — HEPATIC FUNCTION PANEL
ALBUMIN: 4.3 g/dL (ref 3.5–5.2)
ALK PHOS: 60 U/L (ref 39–117)
ALT: 19 U/L (ref 0–53)
AST: 21 U/L (ref 0–37)
BILIRUBIN DIRECT: 0.1 mg/dL (ref 0.0–0.3)
Total Bilirubin: 0.5 mg/dL (ref 0.2–1.2)
Total Protein: 7.4 g/dL (ref 6.0–8.3)

## 2015-01-23 LAB — BASIC METABOLIC PANEL
BUN: 23 mg/dL (ref 6–23)
CO2: 29 mEq/L (ref 19–32)
Calcium: 9.9 mg/dL (ref 8.4–10.5)
Chloride: 101 mEq/L (ref 96–112)
Creatinine, Ser: 1.6 mg/dL — ABNORMAL HIGH (ref 0.40–1.50)
GFR: 44.85 mL/min — AB (ref >60.00)
Glucose, Bld: 93 mg/dL (ref 70–99)
POTASSIUM: 4.9 meq/L (ref 3.5–5.1)
SODIUM: 136 meq/L (ref 135–145)

## 2015-01-23 LAB — TSH: TSH: 0.79 u[IU]/mL (ref 0.35–4.50)

## 2015-01-23 MED ORDER — ATENOLOL 50 MG PO TABS
ORAL_TABLET | ORAL | Status: DC
Start: 2015-01-23 — End: 2016-02-06

## 2015-01-23 MED ORDER — NITROGLYCERIN 0.4 MG SL SUBL: 0.4000 mg | SUBLINGUAL_TABLET | SUBLINGUAL | Status: DC | PRN

## 2015-01-23 MED ORDER — LISINOPRIL 20 MG PO TABS: ORAL_TABLET | ORAL | Status: DC

## 2015-01-23 MED ORDER — ATORVASTATIN CALCIUM 20 MG PO TABS: 20.0000 mg | ORAL_TABLET | Freq: Every day | ORAL | Status: DC

## 2015-01-23 MED ORDER — VERAPAMIL HCL ER 180 MG PO TBCR
EXTENDED_RELEASE_TABLET | ORAL | Status: DC
Start: 2015-01-23 — End: 2016-02-06

## 2015-01-23 MED ORDER — POTASSIUM CHLORIDE ER 10 MEQ PO TBCR: 10.0000 meq | EXTENDED_RELEASE_TABLET | Freq: Once | ORAL | Status: DC

## 2015-01-23 MED ORDER — HYDROCHLOROTHIAZIDE 25 MG PO TABS: ORAL_TABLET | ORAL | Status: DC

## 2015-01-23 NOTE — Patient Instructions (Addendum)
Continue current medications  Continue diet and exercise program  Follow-up in one year with Netta Corrigan,,,,,,, our new adult nurse practitioner from [place]  I do not recommend you have any more PSA screening tests

## 2015-01-23 NOTE — Progress Notes (Signed)
Pre visit review using our clinic review tool, if applicable. No additional management support is needed unless otherwise documented below in the visit note. 

## 2015-01-23 NOTE — Progress Notes (Signed)
Subjective:    Patient ID: [name], male    DOB: [birth year], 74 y.o.   MRN: [id number]  HPI Mase is a delightful [age]-year-old married male nonsmoker who comes in today for general physical examination because of a history of hypertension, hyperlipidemia, coronary artery disease, reflux esophagitis with Barrett's esophagus, erectile dysfunction  His med list reviewed its correct. His BP on all his antihypertensive medicines is 110/80  He tells me that he saw another doctor here who referred him to urology because of hematuria. He's had hematuria in the past. It's not been significant however he did go to urology had a complete workup all of which was predictably normal. He gets his Viagra from Dr. Francella Solian now.  She also sees cardiology. He had bypass surgery many years ago he's asymptomatic. He walks 30 minutes to daily without chest pain or shortness of breath.  He gets routine eye care, dental care, colonoscopy and GI 2014 advise no more colonoscopies since he is asymptomatic in a 74  He sees Dr. Alvin Critchley For follow-up on cardiology.  Vaccinations up-to-date  Cognitive function normal he walks daily home health safety reviewed no issues identified, no guns in the house, he does have a healthcare power of attorney and living well   Review of Systems  Constitutional: Negative.   HENT: Negative.   Eyes: Negative.   Respiratory: Negative.   Cardiovascular: Negative.   Gastrointestinal: Negative.   Endocrine: Negative.   Genitourinary: Negative.   Musculoskeletal: Negative.   Skin: Negative.   Allergic/Immunologic: Negative.   Neurological: Negative.   Hematological: Negative.   Psychiatric/Behavioral: Negative.        Objective:   Physical Exam  Constitutional: He is oriented to person, place, and time. He appears well-developed and well-nourished.  HENT:  Head: Normocephalic and atraumatic.  Right Ear: External ear normal.  Left Ear: External ear normal.  Nose: Nose  normal.  Mouth/Throat: Oropharynx is clear and moist.  Eyes: Conjunctivae and EOM are normal. Pupils are equal, round, and reactive to light.  Neck: Normal range of motion. Neck supple. No JVD present. No tracheal deviation present. No thyromegaly present.  Cardiovascular: Normal rate, regular rhythm, normal heart sounds and intact distal pulses.  Exam reveals no gallop and no friction rub.   No murmur heard. No carotid nor aortic bruits peripheral pulses 1+ and symmetrical  Pulmonary/Chest: Effort normal and breath sounds normal. No stridor. No respiratory distress. He has no wheezes. He has no rales. He exhibits no tenderness.  Abdominal: Soft. Bowel sounds are normal. He exhibits no distension and no mass. There is no tenderness. There is no rebound and no guarding.  Genitourinary: Rectum normal, prostate normal and penis normal. Guaiac negative stool. No penile tenderness.  Musculoskeletal: Normal range of motion. He exhibits no edema or tenderness.  Lymphadenopathy:    He has no cervical adenopathy.  Neurological: He is alert and oriented to person, place, and time. He has normal reflexes. No cranial nerve deficit. He exhibits normal muscle tone.  Skin: Skin is warm and dry. No rash noted. No erythema. No pallor.  Total body skin exam normal except scar midline chest from previous bypass surgery  Psychiatric: He has a normal mood and affect. His behavior is normal. Judgment and thought content normal.  Nursing note and vitals reviewed.         Assessment & Plan:  Hypertension at goal,,,,,, continue current therapy hyperlipidemia goal,,,,,,, continue current therapy status post bypass surgery clinically asymptomatic,,,,,,,,,,, followed  by Dr. [facility]  Erectile dysfunction and BPH,,,,, now followed in urology,,

## 2015-01-24 ENCOUNTER — Other Ambulatory Visit: Payer: Self-pay | Admitting: Dermatology

## 2015-02-27 ENCOUNTER — Other Ambulatory Visit: Payer: Self-pay | Admitting: Family Medicine

## 2015-02-27 DIAGNOSIS — F528 Other sexual dysfunction not due to a substance or known physiological condition: Secondary | ICD-10-CM

## 2015-02-27 MED ORDER — SILDENAFIL CITRATE 100 MG PO TABS: 100.0000 mg | ORAL_TABLET | ORAL | Status: DC | PRN

## 2015-02-27 NOTE — Telephone Encounter (Signed)
Rx sent to pharmacy   

## 2015-02-27 NOTE — Telephone Encounter (Signed)
Pt request refill of the following: sildenafil (VIAGRA) 100 MG tablet   Phamacy:

## 2015-04-12 ENCOUNTER — Other Ambulatory Visit: Payer: Self-pay | Admitting: Family Medicine

## 2015-04-24 ENCOUNTER — Ambulatory Visit: Payer: PRIVATE HEALTH INSURANCE | Admitting: Cardiology

## 2015-05-01 ENCOUNTER — Encounter: Payer: Self-pay | Admitting: Cardiology

## 2015-05-01 ENCOUNTER — Ambulatory Visit (INDEPENDENT_AMBULATORY_CARE_PROVIDER_SITE_OTHER): Payer: Medicare Other | Admitting: Cardiology

## 2015-05-01 VITALS — BP 124/80 | HR 57 | Ht 65.5 in | Wt 177.4 lb

## 2015-05-01 DIAGNOSIS — I10 Essential (primary) hypertension: Secondary | ICD-10-CM

## 2015-05-01 DIAGNOSIS — I2510 Atherosclerotic heart disease of native coronary artery without angina pectoris: Secondary | ICD-10-CM

## 2015-05-01 DIAGNOSIS — N183 Chronic kidney disease, stage 3 unspecified: Secondary | ICD-10-CM | POA: Insufficient documentation

## 2015-05-01 DIAGNOSIS — I259 Chronic ischemic heart disease, unspecified: Secondary | ICD-10-CM | POA: Diagnosis not present

## 2015-05-01 NOTE — Progress Notes (Signed)
HPI The patient presents for follow up of his CAD. I Since I last saw him he has done well.  The patient denies any new symptoms such as chest discomfort, neck or arm discomfort. There has been no new shortness of breath, PND or orthopnea. There have been no reported palpitations, presyncope or syncope. He exercises about 3 times per week 30 minutes at a time without any symptoms.   He did have an elevated creatinine recently. He saw urology. He had his HCTZ reduced.   No Known Allergies  Current Outpatient Prescriptions  Medication Sig Dispense Refill  . aspirin 81 MG EC tablet Take 81 mg by mouth daily.      Marland Kitchen atenolol (TENORMIN) 50 MG tablet TAKE 1 TABLET BY MOUTH TWICE DAILY 200 tablet 3  . atorvastatin (LIPITOR) 20 MG tablet Take 1 tablet (20 mg total) by mouth daily. 100 tablet 3  . clopidogrel (PLAVIX) 75 MG tablet TAKE 1 TABLET BY MOUTH DAILY 100 tablet 3  . Coenzyme Q10 (EQL COQ10) 300 MG CAPS Take by mouth 2 (two) times daily.    . fish oil-omega-3 fatty acids 1000 MG capsule Take 2 g by mouth daily.    . hydrochlorothiazide (HYDRODIURIL) 25 MG tablet TAKE 1 TABLET BY MOUTH DAILY 100 tablet 3  . lisinopril (PRINIVIL,ZESTRIL) 20 MG tablet TAKE 1 TABLET BY MOUTH DAILY 100 tablet 3  . Multiple Vitamin (MULTIVITAMIN) capsule Take 1 capsule by mouth daily. Silver men over  50    . nitroGLYCERIN (NITROSTAT) 0.4 MG SL tablet Place 1 tablet (0.4 mg total) under the tongue every 5 (five) minutes as needed. 25 tablet 1  . pantoprazole (PROTONIX) 40 MG tablet TAKE 1 TABLET BY MOUTH EVERY DAY 100 tablet 1  . potassium chloride (K-DUR) 10 MEQ tablet Take 1 tablet (10 mEq total) by mouth once. 100 tablet 3  . sildenafil (VIAGRA) 100 MG tablet Take 1 tablet (100 mg total) by mouth as needed for erectile dysfunction. 6 tablet 11  . verapamil (CALAN-SR) 180 MG CR tablet TAKE 1 TABLET BY MOUTH EVERY NIGHT AT BEDTIME 100 tablet 3   No current facility-administered medications for this visit.     Past Medical History  Diagnosis Date  . Hypertension   . Hyperlipidemia   . ED (erectile dysfunction)   . CAD (coronary artery disease)     2002 stenting of the RCA,, 2006 Stenting of diag with DES, PTCA of Circ, 50% LAD  . Barrett's esophagus   . Diverticulosis   . GERD (gastroesophageal reflux disease)   . Hemorrhoids   . Duodenal ulcer   . DJD (degenerative joint disease)   . Blood transfusion without reported diagnosis   . Myocardial infarction     Past Surgical History  Procedure Laterality Date  . Spinal fusion    . Spinal fusion    . Spine surgery      cervical    ROS   As stated in the HPI and negative for all other systems.  PHYSICAL EXAM BP 124/80 mmHg  Pulse 57  Ht 5' 5.5" (1.664 m)  Wt 177 lb 6.4 oz (80.468 kg)  BMI 29.06 kg/m2 GENERAL:  Well appearing HEENT:  Pupils equal round and reactive, fundi not visualized, oral mucosa unremarkable NECK:  No jugular venous distention, waveform within normal limits, carotid upstroke brisk and symmetric, no bruits, no thyromegaly LUNGS:  Clear to auscultation bilaterally BACK:  No CVA tenderness CHEST:  Unremarkable HEART:  PMI not displaced or  sustained,S1 and S2 within normal limits, no S3, no S4, no clicks, no rubs, no murmurs ABD:  Flat, positive bowel sounds normal in frequency in pitch, no bruits, no rebound, no guarding, no midline pulsatile mass, no hepatomegaly, no splenomegaly EXT:  2 plus pulses throughout, no edema, no cyanosis no clubbing   EKG:  Sinus bradycardia, rate 57, axis within normal limits, intervals WNL, no acute ST T wave changes.  05/01/2015  ASSESSMENT AND PLAN  CORONARY ARTERY DISEASE, S/P PTCA -  The patient has no new sypmtoms since stress testing in 2013.  No further cardiovascular testing is indicated.  We will continue with aggressive risk reduction and meds as listed.  He can stop the Plavix.   HYPERLIPIDEMIA -  His LDL was 63 in June.   I will continue him on the same therapy.    HYPERTENSION -  The blood pressure is at target. No change in medications is indicated. We will continue with therapeutic lifestyle changes (TLC).    CKD:   We discussed this. His creatinine was up to 1.6 but his HCTZ was reduced to every other day he says that his creatinine is now improved. His blood pressure has remained stable. No change in therapy is indicated.

## 2015-05-01 NOTE — Patient Instructions (Signed)
Your physician wants you to follow-up in: 1 Year. You will receive a reminder letter in the mail two months in advance. If you don't receive a letter, please call our office to schedule the follow-up appointment.  Your physician has recommended you make the following change in your medication: STOP Plavix

## 2015-06-21 ENCOUNTER — Ambulatory Visit (INDEPENDENT_AMBULATORY_CARE_PROVIDER_SITE_OTHER): Payer: Medicare Other | Admitting: *Deleted

## 2015-06-21 DIAGNOSIS — Z23 Encounter for immunization: Secondary | ICD-10-CM | POA: Diagnosis not present

## 2015-11-12 ENCOUNTER — Other Ambulatory Visit: Payer: Self-pay | Admitting: Dermatology

## 2015-11-12 DIAGNOSIS — D485 Neoplasm of uncertain behavior of skin: Secondary | ICD-10-CM | POA: Diagnosis not present

## 2015-11-12 DIAGNOSIS — L821 Other seborrheic keratosis: Secondary | ICD-10-CM | POA: Diagnosis not present

## 2015-11-12 DIAGNOSIS — D044 Carcinoma in situ of skin of scalp and neck: Secondary | ICD-10-CM | POA: Diagnosis not present

## 2015-11-13 ENCOUNTER — Other Ambulatory Visit: Payer: Self-pay | Admitting: Family Medicine

## 2015-12-21 ENCOUNTER — Encounter: Payer: Self-pay | Admitting: Gastroenterology

## 2015-12-27 DIAGNOSIS — D044 Carcinoma in situ of skin of scalp and neck: Secondary | ICD-10-CM | POA: Diagnosis not present

## 2016-01-11 ENCOUNTER — Other Ambulatory Visit: Payer: Self-pay | Admitting: Family Medicine

## 2016-01-15 ENCOUNTER — Telehealth: Payer: Self-pay | Admitting: Internal Medicine

## 2016-01-15 ENCOUNTER — Encounter: Payer: Self-pay | Admitting: Gastroenterology

## 2016-01-15 NOTE — Telephone Encounter (Signed)
Pt request to transfer from Dr. Sherren Mocha to Dr. Ronnald Ramp. Please advise, he wants a physical.

## 2016-01-15 NOTE — Telephone Encounter (Signed)
Ok with me 

## 2016-01-24 ENCOUNTER — Encounter: Payer: Self-pay | Admitting: Gastroenterology

## 2016-02-06 ENCOUNTER — Emergency Department (HOSPITAL_BASED_OUTPATIENT_CLINIC_OR_DEPARTMENT_OTHER)
Admission: EM | Admit: 2016-02-06 | Discharge: 2016-02-06 | Disposition: A | Discharge status: Home / Self Care | Payer: PPO | Attending: Emergency Medicine | Admitting: Emergency Medicine

## 2016-02-06 ENCOUNTER — Encounter: Payer: Self-pay | Admitting: Internal Medicine

## 2016-02-06 ENCOUNTER — Emergency Department (HOSPITAL_BASED_OUTPATIENT_CLINIC_OR_DEPARTMENT_OTHER): Payer: PPO

## 2016-02-06 ENCOUNTER — Other Ambulatory Visit (INDEPENDENT_AMBULATORY_CARE_PROVIDER_SITE_OTHER): Payer: PPO

## 2016-02-06 ENCOUNTER — Ambulatory Visit (INDEPENDENT_AMBULATORY_CARE_PROVIDER_SITE_OTHER): Payer: PPO | Admitting: Internal Medicine

## 2016-02-06 ENCOUNTER — Encounter (HOSPITAL_BASED_OUTPATIENT_CLINIC_OR_DEPARTMENT_OTHER): Payer: Self-pay

## 2016-02-06 VITALS — BP 118/80 | HR 71 | Temp 99.4°F | Resp 16 | Ht 65.5 in | Wt 182.0 lb

## 2016-02-06 DIAGNOSIS — I10 Essential (primary) hypertension: Secondary | ICD-10-CM

## 2016-02-06 DIAGNOSIS — Z87891 Personal history of nicotine dependence: Secondary | ICD-10-CM | POA: Diagnosis not present

## 2016-02-06 DIAGNOSIS — E785 Hyperlipidemia, unspecified: Secondary | ICD-10-CM

## 2016-02-06 DIAGNOSIS — Z0000 Encounter for general adult medical examination without abnormal findings: Secondary | ICD-10-CM

## 2016-02-06 DIAGNOSIS — Z7982 Long term (current) use of aspirin: Secondary | ICD-10-CM | POA: Insufficient documentation

## 2016-02-06 DIAGNOSIS — J189 Pneumonia, unspecified organism: Secondary | ICD-10-CM | POA: Diagnosis not present

## 2016-02-06 DIAGNOSIS — R319 Hematuria, unspecified: Secondary | ICD-10-CM | POA: Insufficient documentation

## 2016-02-06 DIAGNOSIS — I2510 Atherosclerotic heart disease of native coronary artery without angina pectoris: Secondary | ICD-10-CM

## 2016-02-06 DIAGNOSIS — Z23 Encounter for immunization: Secondary | ICD-10-CM

## 2016-02-06 DIAGNOSIS — Z79899 Other long term (current) drug therapy: Secondary | ICD-10-CM | POA: Diagnosis not present

## 2016-02-06 DIAGNOSIS — R5383 Other fatigue: Secondary | ICD-10-CM | POA: Diagnosis not present

## 2016-02-06 DIAGNOSIS — N183 Chronic kidney disease, stage 3 unspecified: Secondary | ICD-10-CM

## 2016-02-06 DIAGNOSIS — I252 Old myocardial infarction: Secondary | ICD-10-CM | POA: Insufficient documentation

## 2016-02-06 DIAGNOSIS — R509 Fever, unspecified: Secondary | ICD-10-CM

## 2016-02-06 DIAGNOSIS — N401 Enlarged prostate with lower urinary tract symptoms: Secondary | ICD-10-CM

## 2016-02-06 DIAGNOSIS — N138 Other obstructive and reflux uropathy: Secondary | ICD-10-CM

## 2016-02-06 LAB — CBC WITH DIFFERENTIAL/PLATELET
BASOS ABS: 0 10*3/uL (ref 0.0–0.1)
BASOS PCT: 0.4 % (ref 0.0–3.0)
BASOS PCT: 0 %
Basophils Absolute: 0.1 10*3/uL (ref 0.0–0.1)
EOS PCT: 1.4 % (ref 0.0–5.0)
EOS PCT: 1 %
Eosinophils Absolute: 0.2 10*3/uL (ref 0.0–0.7)
Eosinophils Absolute: 0.1 10*3/uL (ref 0.0–0.7)
HCT: 41 % (ref 39.0–52.0)
HCT: 36.8 % — ABNORMAL LOW (ref 39.0–52.0)
HEMOGLOBIN: 13.9 g/dL (ref 13.0–17.0)
Hemoglobin: 12.5 g/dL — ABNORMAL LOW (ref 13.0–17.0)
LYMPHS ABS: 1.6 10*3/uL (ref 0.7–4.0)
Lymphocytes Relative: 11.7 % — ABNORMAL LOW (ref 12.0–46.0)
Lymphocytes Relative: 14 %
Lymphs Abs: 1.3 10*3/uL (ref 0.7–4.0)
MCH: 31.5 pg (ref 26.0–34.0)
MCHC: 34 g/dL (ref 30.0–36.0)
MCHC: 34 g/dL (ref 30.0–36.0)
MCV: 92.1 fl (ref 78.0–100.0)
MCV: 92.7 fL (ref 78.0–100.0)
MONO ABS: 1 10*3/uL (ref 0.1–1.0)
MONOS PCT: 9.4 % (ref 3.0–12.0)
Monocytes Absolute: 1.3 10*3/uL — ABNORMAL HIGH (ref 0.1–1.0)
Monocytes Relative: 10 %
Neutro Abs: 10.6 10*3/uL — ABNORMAL HIGH (ref 1.4–7.7)
Neutro Abs: 7.2 10*3/uL (ref 1.7–7.7)
Neutrophils Relative %: 77.1 % — ABNORMAL HIGH (ref 43.0–77.0)
Neutrophils Relative %: 75 %
PLATELETS: 170 10*3/uL (ref 150–400)
Platelets: 214 10*3/uL (ref 150.0–400.0)
RBC: 4.45 Mil/uL (ref 4.22–5.81)
RBC: 3.97 MIL/uL — ABNORMAL LOW (ref 4.22–5.81)
RDW: 12.9 % (ref 11.5–15.5)
RDW: 12.6 % (ref 11.5–15.5)
WBC: 13.7 10*3/uL — ABNORMAL HIGH (ref 4.0–10.5)
WBC: 9.6 10*3/uL (ref 4.0–10.5)

## 2016-02-06 LAB — COMPREHENSIVE METABOLIC PANEL
ALK PHOS: 58 U/L (ref 39–117)
ALT: 16 U/L (ref 0–53)
AST: 18 U/L (ref 0–37)
Albumin: 4.1 g/dL (ref 3.5–5.2)
BUN: 20 mg/dL (ref 6–23)
CHLORIDE: 101 meq/L (ref 96–112)
CO2: 30 mEq/L (ref 19–32)
Calcium: 9.7 mg/dL (ref 8.4–10.5)
Creatinine, Ser: 1.27 mg/dL (ref 0.40–1.50)
GFR: 58.39 mL/min — AB (ref >60.00)
GLUCOSE: 106 mg/dL — AB (ref 70–99)
POTASSIUM: 4.6 meq/L (ref 3.5–5.1)
SODIUM: 138 meq/L (ref 135–145)
TOTAL PROTEIN: 7 g/dL (ref 6.0–8.3)
Total Bilirubin: 0.5 mg/dL (ref 0.2–1.2)

## 2016-02-06 LAB — LIPID PANEL
CHOL/HDL RATIO: 3
Cholesterol: 131 mg/dL (ref 0–200)
HDL: 49.1 mg/dL (ref >39.00)
LDL CALC: 65 mg/dL (ref 0–99)
NONHDL: 81.86
Triglycerides: 86 mg/dL (ref 0.0–149.0)
VLDL: 17.2 mg/dL (ref 0.0–40.0)

## 2016-02-06 LAB — URINALYSIS, ROUTINE W REFLEX MICROSCOPIC
BILIRUBIN URINE: NEGATIVE
BILIRUBIN URINE: NEGATIVE
Glucose, UA: NEGATIVE mg/dL
KETONES UR: NEGATIVE
KETONES UR: NEGATIVE mg/dL
LEUKOCYTES UA: NEGATIVE
Leukocytes, UA: NEGATIVE
NITRITE: NEGATIVE
Nitrite: NEGATIVE
PH: 6 (ref 5.0–8.0)
PH: 5.5 (ref 5.0–8.0)
Protein, ur: NEGATIVE mg/dL
Specific Gravity, Urine: 1.015 (ref 1.000–1.030)
Specific Gravity, Urine: 1.013 (ref 1.005–1.030)
TOTAL PROTEIN, URINE-UPE24: NEGATIVE
UROBILINOGEN UA: 0.2 (ref 0.0–1.0)
Urine Glucose: NEGATIVE

## 2016-02-06 LAB — URINE MICROSCOPIC-ADD ON: WBC UA: NONE SEEN WBC/hpf (ref 0–5)

## 2016-02-06 LAB — I-STAT CG4 LACTIC ACID, ED: Lactic Acid, Venous: 1.06 mmol/L (ref 0.5–2.0)

## 2016-02-06 LAB — TSH: TSH: 0.65 u[IU]/mL (ref 0.35–4.50)

## 2016-02-06 MED ORDER — LISINOPRIL 20 MG PO TABS: ORAL_TABLET | ORAL | Status: DC

## 2016-02-06 MED ORDER — ATENOLOL 50 MG PO TABS: ORAL_TABLET | ORAL | Status: DC

## 2016-02-06 MED ORDER — SODIUM CHLORIDE 0.9 % IV BOLUS (SEPSIS)
500.0000 mL | Freq: Once | INTRAVENOUS | Status: AC
  Administered 2016-02-06: 500 mL via INTRAVENOUS

## 2016-02-06 MED ORDER — ATORVASTATIN CALCIUM 20 MG PO TABS: 20.0000 mg | ORAL_TABLET | Freq: Every day | ORAL | Status: DC

## 2016-02-06 MED ORDER — HYDROCHLOROTHIAZIDE 25 MG PO TABS: ORAL_TABLET | ORAL | Status: DC

## 2016-02-06 MED ORDER — LEVOFLOXACIN 500 MG PO TABS
500.0000 mg | ORAL_TABLET | Freq: Once | ORAL | Status: AC
  Administered 2016-02-06: 500 mg via ORAL
  Filled 2016-02-06: qty 1

## 2016-02-06 MED ORDER — POTASSIUM CHLORIDE ER 10 MEQ PO TBCR: 10.0000 meq | EXTENDED_RELEASE_TABLET | Freq: Once | ORAL | Status: DC

## 2016-02-06 MED ORDER — VERAPAMIL HCL ER 180 MG PO TBCR: EXTENDED_RELEASE_TABLET | ORAL | Status: DC

## 2016-02-06 MED ORDER — LEVOFLOXACIN 500 MG PO TABS: 500.0000 mg | ORAL_TABLET | Freq: Every day | ORAL | Status: DC

## 2016-02-06 MED ORDER — POTASSIUM CHLORIDE ER 10 MEQ PO TBCR: 10.0000 meq | EXTENDED_RELEASE_TABLET | ORAL | Status: DC

## 2016-02-06 NOTE — Progress Notes (Signed)
Pre visit review using our clinic review tool, if applicable. No additional management support is needed unless otherwise documented below in the visit note. 

## 2016-02-06 NOTE — Progress Notes (Signed)
Subjective:  Patient ID: [name], male    DOB: [birth year]  Age: 75 y.o. MRN: [id number]  CC: Annual Exam; Hypertension; and Hyperlipidemia  NEW TO ME  HPI [name] presents for a CPX.  He tells me his blood pressure has been well controlled on the combination of an atenolol, hydrochlorothiazide, lisinopril, and verapamil. He has had no recent episodes of chest pain, shortness of breath, palpitations, edema, fatigue, or near-syncope.  He has a history of mild renal insufficiency and coronary artery disease. He is tolerating statin therapy well with no muscle aches or joint aches.  History [name] has a past medical history of Hypertension; Hyperlipidemia; ED (erectile dysfunction); CAD (coronary artery disease); Barrett's esophagus; Diverticulosis; GERD (gastroesophageal reflux disease); Hemorrhoids; Duodenal ulcer; DJD (degenerative joint disease); Blood transfusion without reported diagnosis; and Myocardial infarction ([place]).   He has past surgical history that includes Spinal fusion; Spinal fusion; and Spine surgery.   His family history is not on file.He reports that he quit smoking about 54 years ago. He has never used smokeless tobacco. He reports that he drinks alcohol. He reports that he does not use illicit drugs.  Outpatient Prescriptions Prior to Visit  Medication Sig Dispense Refill  . aspirin 81 MG EC tablet Take 81 mg by mouth daily.      . Coenzyme Q10 (EQL COQ10) 300 MG CAPS Take by mouth 2 (two) times daily.    . fish oil-omega-3 fatty acids 1000 MG capsule Take 2 g by mouth daily.    . Multiple Vitamin (MULTIVITAMIN) capsule Take 1 capsule by mouth daily. Silver men over  50    . nitroGLYCERIN (NITROSTAT) 0.4 MG SL tablet Place 1 tablet (0.4 mg total) under the tongue every 5 (five) minutes as needed. 25 tablet 1  . pantoprazole (PROTONIX) 40 MG tablet TAKE 1 TABLET BY MOUTH EVERY DAY 100 tablet 0  . sildenafil (VIAGRA) 100 MG tablet Take 1 tablet (100  mg total) by mouth as needed for erectile dysfunction. 6 tablet 11  . atenolol (TENORMIN) 50 MG tablet TAKE 1 TABLET BY MOUTH TWICE DAILY 200 tablet 3  . atorvastatin (LIPITOR) 20 MG tablet Take 1 tablet (20 mg total) by mouth daily. 100 tablet 3  . hydrochlorothiazide (HYDRODIURIL) 25 MG tablet TAKE 1 TABLET BY MOUTH DAILY (Patient taking differently: TAKE 1 TABLET BY MOUTH Monday, Wednesday, and Friday) 100 tablet 3  . lisinopril (PRINIVIL,ZESTRIL) 20 MG tablet TAKE 1 TABLET BY MOUTH DAILY 100 tablet 3  . potassium chloride (K-DUR) 10 MEQ tablet Take 1 tablet (10 mEq total) by mouth once. 100 tablet 3  . verapamil (CALAN-SR) 180 MG CR tablet TAKE 1 TABLET BY MOUTH EVERY NIGHT AT BEDTIME 100 tablet 3   No facility-administered medications prior to visit.    ROS Review of Systems  Constitutional: Negative.  Negative for fever, chills, activity change, appetite change, fatigue and unexpected weight change.  HENT: Negative.  Negative for facial swelling, sinus pressure, trouble swallowing and voice change.   Eyes: Negative.  Negative for visual disturbance.  Respiratory: Negative.  Negative for cough, choking, chest tightness, shortness of breath and stridor.   Cardiovascular: Negative.  Negative for chest pain, palpitations and leg swelling.  Gastrointestinal: Negative.  Negative for nausea, vomiting, abdominal pain, diarrhea, constipation and blood in stool.  Endocrine: Negative.   Genitourinary: Negative.  Negative for difficulty urinating.  Musculoskeletal: Negative.  Negative for myalgias, back pain, joint swelling, arthralgias and neck pain.  Skin: Negative.  Negative for color change, pallor and rash.  Allergic/Immunologic: Negative.   Neurological: Negative.  Negative for dizziness, tremors, weakness, light-headedness and headaches.  Hematological: Negative.  Negative for adenopathy. Does not bruise/bleed easily.  Psychiatric/Behavioral: Negative.     Objective:  BP 118/80 mmHg   Pulse 71  Temp(Src) 99.4 F (37.4 C) (Oral)  Resp 16  Ht 5' 5.5" (1.664 m)  Wt 182 lb (82.555 kg)  BMI 29.82 kg/m2  SpO2 96%  Physical Exam  Constitutional: He is oriented to person, place, and time. He appears well-developed and well-nourished. No distress.  HENT:  Head: Normocephalic and atraumatic.  Mouth/Throat: Oropharynx is clear and moist. No oropharyngeal exudate.  Eyes: Conjunctivae are normal. Right eye exhibits no discharge. Left eye exhibits no discharge. No scleral icterus.  Neck: Normal range of motion. Neck supple. No JVD present. No tracheal deviation present. No thyromegaly present.  Cardiovascular: Normal rate, regular rhythm, normal heart sounds and intact distal pulses.  Exam reveals no gallop and no friction rub.   No murmur heard. Pulmonary/Chest: Effort normal and breath sounds normal. No stridor. No respiratory distress. He has no wheezes. He has no rales. He exhibits no tenderness.  Abdominal: Soft. Bowel sounds are normal. He exhibits no distension and no mass. There is no tenderness. There is no rebound and no guarding.  Musculoskeletal: Normal range of motion. He exhibits no edema or tenderness.  Lymphadenopathy:    He has no cervical adenopathy.  Neurological: He is oriented to person, place, and time.  Skin: Skin is warm and dry. No rash noted. He is not diaphoretic. No erythema. No pallor.  Vitals reviewed.   Lab Results  Component Value Date   WBC 9.6 02/06/2016   HGB 12.5* 02/06/2016   HCT 36.8* 02/06/2016   PLT 170 02/06/2016   GLUCOSE 106* 02/06/2016   CHOL 131 02/06/2016   TRIG 86.0 02/06/2016   HDL 49.10 02/06/2016   LDLCALC 65 02/06/2016   ALT 16 02/06/2016   AST 18 02/06/2016   NA 138 02/06/2016   K 4.6 02/06/2016   CL 101 02/06/2016   CREATININE 1.27 02/06/2016   BUN 20 02/06/2016   CO2 30 02/06/2016   TSH 0.65 02/06/2016   PSA 1.93 01/18/2014    Assessment & Plan:   [name] was seen today for annual exam, hypertension and  hyperlipidemia.  Diagnoses and all orders for this visit:  CKD (chronic kidney disease), stage III- his renal function is stable, he agrees to avoid nephrotoxic agents, will continue strict blood pressure control as well. -     Comprehensive metabolic panel; Future -     Urinalysis, Routine w reflex microscopic (not at [facility]); Future  Hyperlipidemia- he is achieved his LDL goal is doing well on statin therapy -     Lipid panel; Future -     TSH; Future  Coronary artery disease involving native coronary artery of native heart without angina pectoris- he is had no recent episodes of angina or any angina-like symptoms, will continue to modify his risk factors with statin therapy, blood pressure control, and aspirin therapy. -     Lipid panel; Future  Essential hypertension- blood pressure is well-controlled, electrolytes and renal function are stable. -     Comprehensive metabolic panel; Future -     CBC with Differential/Platelet; Future -     hydrochlorothiazide (HYDRODIURIL) 25 MG tablet; TAKE 1 TABLET BY MOUTH Monday, Wednesday, and Friday -     lisinopril (PRINIVIL,ZESTRIL) 20 MG tablet; TAKE  1 TABLET BY MOUTH DAILY -     potassium chloride (K-DUR) 10 MEQ tablet; Take 1 tablet (10 mEq total) by mouth once. -     verapamil (CALAN-SR) 180 MG CR tablet; TAKE 1 TABLET BY MOUTH EVERY NIGHT AT BEDTIME -     atenolol (TENORMIN) 50 MG tablet; TAKE 1 TABLET BY MOUTH TWICE DAILY  BPH with obstruction/lower urinary tract symptoms- he has no symptoms related to this that need to be treated, he is over 75 so I did not do a prostate examination recheck his PSA  Hyperlipidemia with target LDL less than 70- he has achieved his LDL goal.  Other orders -     atorvastatin (LIPITOR) 20 MG tablet; Take 1 tablet (20 mg total) by mouth daily.  I have discontinued Mr. Guizar potassium chloride and potassium chloride. I have also changed his hydrochlorothiazide and potassium chloride. Additionally, I am  having him maintain his aspirin, fish oil-omega-3 fatty acids, multivitamin, Coenzyme Q10, nitroGLYCERIN, sildenafil, pantoprazole, atorvastatin, lisinopril, verapamil, and atenolol.  Meds ordered this encounter  Medications  . atorvastatin (LIPITOR) 20 MG tablet    Sig: Take 1 tablet (20 mg total) by mouth daily.    Dispense:  100 tablet    Refill:  3  . hydrochlorothiazide (HYDRODIURIL) 25 MG tablet    Sig: TAKE 1 TABLET BY MOUTH Monday, Wednesday, and Friday    Dispense:  100 tablet    Refill:  3  . lisinopril (PRINIVIL,ZESTRIL) 20 MG tablet    Sig: TAKE 1 TABLET BY MOUTH DAILY    Dispense:  100 tablet    Refill:  3  . DISCONTD: potassium chloride (K-DUR) 10 MEQ tablet    Sig: Take 1 tablet (10 mEq total) by mouth once.    Dispense:  100 tablet    Refill:  3  . verapamil (CALAN-SR) 180 MG CR tablet    Sig: TAKE 1 TABLET BY MOUTH EVERY NIGHT AT BEDTIME    Dispense:  100 tablet    Refill:  3  . atenolol (TENORMIN) 50 MG tablet    Sig: TAKE 1 TABLET BY MOUTH TWICE DAILY    Dispense:  200 tablet    Refill:  3  . potassium chloride (K-DUR) 10 MEQ tablet    Sig: Take 1 tablet (10 mEq total) by mouth every Monday, Wednesday, and Friday.    Dispense:  100 tablet    Refill:  3   See AVS for instructions about healthy living and anticipatory guidance.  Follow-up: Return in about 6 months (around 08/07/2016).  Scarlette Calico, MD

## 2016-02-06 NOTE — Discharge Instructions (Signed)
Fever, Adult A fever is an increase in the body's temperature. It is usually defined as a temperature of 100F (38C) or higher. Brief mild or moderate fevers generally have no long-term effects, and they often do not require treatment. Moderate or high fevers may make you feel uncomfortable and can sometimes be a sign of a serious illness or disease. The sweating that may occur with repeated or prolonged fever may also cause dehydration. Fever is confirmed by taking a temperature with a thermometer. A measured temperature can vary with:  Age.  Time of day.  Location of the thermometer:  Mouth (oral).  Rectum (rectal).  Ear (tympanic).  Underarm (axillary).  Forehead (temporal). HOME CARE INSTRUCTIONS Pay attention to any changes in your symptoms. Take these actions to help with your condition:  Take over-the counter and prescription medicines only as told by your health care provider. Follow the dosing instructions carefully.  If you were prescribed an antibiotic medicine, take it as told by your health care provider. Do not stop taking the antibiotic even if you start to feel better.  Rest as needed.  Drink enough fluid to keep your urine clear or pale yellow. This helps to prevent dehydration.  Sponge yourself or bathe with room-temperature water to help reduce your body temperature as needed. Do not use ice water.  Do not overbundle yourself in blankets or heavy clothes. SEEK MEDICAL CARE IF:  You vomit.  You cannot eat or drink without vomiting.  You have diarrhea.  You have pain when you urinate.  Your symptoms do not improve with treatment.  You develop new symptoms.  You develop excessive weakness. SEEK IMMEDIATE MEDICAL CARE IF:  You have shortness of breath or have trouble breathing.  You are dizzy or you faint.  You are disoriented or confused.  You develop signs of dehydration, such as a dry mouth, decreased urination, or paleness.  You develop  severe pain in your abdomen.  You have persistent vomiting or diarrhea.  You develop a skin rash.  Your symptoms suddenly get worse.   This information is not intended to replace advice given to you by your health care provider. Make sure you discuss any questions you have with your health care provider.   Document Released: 02/04/2001 Document Revised: 05/02/2015 Document Reviewed: 10/05/2014 Elsevier Interactive Patient Education 2016 Else[employer]  Hematuria, Adult Hematuria is blood in your urine. It can be caused by a bladder infection, kidney infection, prostate infection, kidney stone, or cancer of your urinary tract. Infections can usually be treated with medicine, and a kidney stone usually will pass through your urine. If neither of these is the cause of your hematuria, further workup to find out the reason may be needed. It is very important that you tell your health care provider about any blood you see in your urine, even if the blood stops without treatment or happens without causing pain. Blood in your urine that happens and then stops and then happens again can be a symptom of a very serious condition. Also, pain is not a symptom in the initial stages of many urinary cancers. HOME CARE INSTRUCTIONS   Drink lots of fluid, 3-4 quarts a day. If you have been diagnosed with an infection, cranberry juice is especially recommended, in addition to large amounts of water.  Avoid caffeine, tea, and carbonated beverages because they tend to irritate the bladder.  Avoid alcohol because it may irritate the prostate.  Take all medicines as directed by your health  care provider.  If you were prescribed an antibiotic medicine, finish it all even if you start to feel better.  If you have been diagnosed with a kidney stone, follow your health care provider's instructions regarding straining your urine to catch the stone.  Empty your bladder often. Avoid holding urine for long periods of  time.  After a bowel movement, women should cleanse front to back. Use each tissue only once.  Empty your bladder before and after sexual intercourse if you are a male. SEEK MEDICAL CARE IF:  You develop back pain.  You have a fever.  You have a feeling of sickness in your stomach (nausea) or vomiting.  Your symptoms are not better in 3 days. Return sooner if you are getting worse. SEEK IMMEDIATE MEDICAL CARE IF:   You develop severe vomiting and are unable to keep the medicine down.  You develop severe back or abdominal pain despite taking your medicines.  You begin passing a large amount of blood or clots in your urine.  You feel extremely weak or faint, or you pass out. MAKE SURE YOU:   Understand these instructions.  Will watch your condition.  Will get help right away if you are not doing well or get worse.   This information is not intended to replace advice given to you by your health care provider. Make sure you discuss any questions you have with your health care provider.   Document Released: 08/11/2005 Document Revised: 09/01/2014 Document Reviewed: 04/11/2013 Elsevier Interactive Patient Education [employer].

## 2016-02-06 NOTE — ED Notes (Signed)
Pt had physical this morning where he received a PNA shot, started getting the chills and fatigue this afternoon and had a fever of 103.  He took two advil at 65.  He denies any other symptoms, no cough, no sore throat or pain.

## 2016-02-06 NOTE — ED Provider Notes (Signed)
CSN: [id number]     Arrival date & time 02/06/16  2108 History  By signing my name below, I, Reola Mosher, attest that this documentation has been prepared under the direction and in the presence of Charlesetta Shanks, MD.  Electronically Signed: Reola Mosher, ED Scribe. 02/06/2016. 10:12 PM.   Chief Complaint  Patient presents with  . Fever   The history is provided by the patient and the spouse. No language interpreter was used.   HPI Comments: [name] is a 75 y.o. male with a PMHx significant of HTN, HLD, CAD, GERD, and MI who presents to the Emergency Department complaining of a gradual onset, gradually improving fever (Tmax 103.2) onset ~12 hours PTA. He has associated diaphoresis, chills and fatigue. He reports that he had been feeling fatigued prior to today or the onset of his other sx, but it worsened after an appointment with his PCP where he received a PNA immunization and had his annual physical done. He took two Advil ~2 hours PTA with no noted relief. Pts wife reports that he took 3-4 naps today which is very unusual for him. Pt reports that they were recently travelling and visited a museum. Pt denies cough, CP, SOB, abdominal pain, nausea, vomiting, rash, joint swelling, diffuse myalgia, sore throat, or congestion. No recent hospitalizations or procedures.    Past Medical History  Diagnosis Date  . Hypertension   . Hyperlipidemia   . ED (erectile dysfunction)   . CAD (coronary artery disease)     2002 stenting of the RCA,, 2006 Stenting of diag with DES, PTCA of Circ, 50% LAD  . Barrett's esophagus   . Diverticulosis   . GERD (gastroesophageal reflux disease)   . Hemorrhoids   . Duodenal ulcer   . DJD (degenerative joint disease)   . Blood transfusion without reported diagnosis   . Myocardial infarction [facility])    Past Surgical History  Procedure Laterality Date  . Spinal fusion    . Spinal fusion    . Spine surgery      cervical   Family History   Problem Relation Age of Onset  . Alcohol abuse    . Coronary artery disease    . Alzheimer's disease     Social History  Substance Use Topics  . Smoking status: Former Smoker    Quit date: 08/25/1961  . Smokeless tobacco: Never Used  . Alcohol Use: Yes     Comment: rarely    Review of Systems 10 Systems reviewed and all are negative for acute change except as noted in the HPI.  Allergies  Review of patient's allergies indicates no known allergies.  Home Medications   Prior to Admission medications   Medication Sig Start Date End Date Taking? Authorizing Provider  aspirin 81 MG EC tablet Take 81 mg by mouth daily.      Historical Provider, MD  atenolol (TENORMIN) 50 MG tablet TAKE 1 TABLET BY MOUTH TWICE DAILY 02/06/16   [name], MD  atorvastatin (LIPITOR) 20 MG tablet Take 1 tablet (20 mg total) by mouth daily. 02/06/16   [name], MD  Coenzyme Q10 (EQL COQ10) 300 MG CAPS Take by mouth 2 (two) times daily.    Historical Provider, MD  fish oil-omega-3 fatty acids 1000 MG capsule Take 2 g by mouth daily.    Historical Provider, MD  hydrochlorothiazide (HYDRODIURIL) 25 MG tablet TAKE 1 TABLET BY MOUTH Monday, Wednesday, and Friday 02/06/16   [name],  MD  levofloxacin (LEVAQUIN) 500 MG tablet Take 1 tablet (500 mg total) by mouth daily. 02/07/16   Charlesetta Shanks, MD  lisinopril (PRINIVIL,ZESTRIL) 20 MG tablet TAKE 1 TABLET BY MOUTH DAILY 02/06/16   [name], MD  Multiple Vitamin (MULTIVITAMIN) capsule Take 1 capsule by mouth daily. Silver men over  40    Historical Provider, MD  nitroGLYCERIN (NITROSTAT) 0.4 MG SL tablet Place 1 tablet (0.4 mg total) under the tongue every 5 (five) minutes as needed. 01/23/15   Dorena Cookey, MD  pantoprazole (PROTONIX) 40 MG tablet TAKE 1 TABLET BY MOUTH EVERY DAY 01/14/16   Dorena Cookey, MD  potassium chloride (K-DUR) 10 MEQ tablet Take 1 tablet (10 mEq total) by mouth every Monday, Wednesday, and Friday. 02/06/16   [name], MD  sildenafil (VIAGRA) 100 MG tablet Take 1 tablet (100 mg total) by mouth as needed for erectile dysfunction. 02/27/15   Dorena Cookey, MD  verapamil (CALAN-SR) 180 MG CR tablet TAKE 1 TABLET BY MOUTH EVERY NIGHT AT BEDTIME 02/06/16   [name], MD   BP 138/69 mmHg  Pulse 66  Temp(Src) 98.8 F (37.1 C) (Oral)  Resp 18  Ht 5\' 5"  (1.651 m)  Wt 182 lb (82.555 kg)  BMI 30.29 kg/m2  SpO2 94%   Physical Exam  Constitutional: He is oriented to person, place, and time. He appears well-developed and well-nourished.  HENT:  Head: Normocephalic.  Eyes: Conjunctivae are normal.  Neck: Neck supple.  Cardiovascular: Normal rate, regular rhythm, normal heart sounds and intact distal pulses.   Pulmonary/Chest: Effort normal. No respiratory distress. He has no wheezes. He has no rales.  Abdominal: Soft. Bowel sounds are normal. He exhibits no distension. There is no tenderness. There is no rebound.  Musculoskeletal: Normal range of motion. He exhibits no edema or tenderness.  Neurological: He is alert and oriented to person, place, and time. No cranial nerve deficit. He exhibits normal muscle tone. Coordination normal.  Skin: Skin is warm and dry.  Psychiatric: He has a normal mood and affect. His behavior is normal.  Nursing note and vitals reviewed.  ED Course  Procedures (including critical care time)  DIAGNOSTIC STUDIES: Oxygen Saturation is 95% on RA, adequate by my interpretation.   COORDINATION OF CARE: 10:02 PM-Discussed next steps with pt including UA, CBC, and DG Chest. Pt verbalized understanding and is agreeable with the plan.   Labs Review Labs Reviewed  URINE CULTURE - Abnormal; Notable for the following:    Culture   (*)    Value: <10,000 COLONIES/mL INSIGNIFICANT GROWTH Performed at [facility]    All other components within normal limits  CBC WITH DIFFERENTIAL/PLATELET - Abnormal; Notable for the following:    RBC 3.97 (*)    Hemoglobin 12.5 (*)     HCT 36.8 (*)    All other components within normal limits  URINALYSIS, ROUTINE W REFLEX MICROSCOPIC (NOT AT [facility]) - Abnormal; Notable for the following:    APPearance CLOUDY (*)    Hgb urine dipstick LARGE (*)    All other components within normal limits  URINE MICROSCOPIC-ADD ON - Abnormal; Notable for the following:    Squamous Epithelial / LPF 0-5 (*)    Bacteria, UA RARE (*)    All other components within normal limits  I-STAT CG4 LACTIC ACID, ED   Imaging Review Dg Chest 2 View  02/06/2016  CLINICAL DATA:  Chills and fatigue after pneumonia shot.  Fever. EXAM:  CHEST  2 VIEW COMPARISON:  01/07/2007 FINDINGS: Surgical plate in the lower cervical spine. Both lungs are clear. Heart and mediastinum are within normal limits. No pleural effusions. No acute bone abnormality. IMPRESSION: No active cardiopulmonary disease. Electronically Signed   By: Markus Daft M.D.   On: 02/06/2016 22:46    I have personally reviewed and evaluated these images and lab results as part of my medical decision-making.  MDM   Final diagnoses:  Fever, unspecified fever cause  Hematuria    Patient presents with fever and chills. He has minimal localizing symptoms. Patient however does note that his urine is been dark. Urine does show positive for blood. At this time I will treat with Levaquin for probable UTI. Culture will be pending. The patient is stable. He has no hypotension or tachycardia. Fever is controlled by antipyretics. Plan will be for outpatient follow-up. Signs and symptoms for which return are reviewed.  Charlesetta Shanks, MD 02/13/16 [id number]

## 2016-02-06 NOTE — Patient Instructions (Signed)

## 2016-02-07 DIAGNOSIS — Z0000 Encounter for general adult medical examination without abnormal findings: Secondary | ICD-10-CM | POA: Insufficient documentation

## 2016-02-07 NOTE — Assessment & Plan Note (Signed)

## 2016-02-08 LAB — URINE CULTURE: Culture: <10000 — AB

## 2016-02-11 ENCOUNTER — Encounter: Payer: Self-pay | Admitting: Internal Medicine

## 2016-02-11 ENCOUNTER — Ambulatory Visit (INDEPENDENT_AMBULATORY_CARE_PROVIDER_SITE_OTHER): Payer: PPO | Admitting: Internal Medicine

## 2016-02-11 VITALS — BP 140/86 | HR 61 | Temp 98.7°F | Resp 16 | Ht 65.0 in | Wt 180.0 lb

## 2016-02-11 DIAGNOSIS — N3000 Acute cystitis without hematuria: Secondary | ICD-10-CM | POA: Diagnosis not present

## 2016-02-11 NOTE — Progress Notes (Signed)
Subjective:  Patient ID: [name], male    DOB: 1940-11-09  Age: 75 y.o. MRN: [id number]  CC: Fever   HPI [name] presents for a recheck after an ER visit for fever. I saw him 5 days ago and during that visit gave him a pneumonia vaccine. Later that day he spiked a fever of 101 and 102 and complained that his urine was dark. He was seen in emergency room and felt to have a urinary tract infection and was prescribed Levaquin. He tells me he feels much better, the fever has resolved and he denies abdominal pain, chills, dysuria, hematuria, diarrhea, rash, fatigue, rash, or swelling.  Outpatient Prescriptions Prior to Visit  Medication Sig Dispense Refill  . aspirin 81 MG EC tablet Take 81 mg by mouth daily.      Marland Kitchen atenolol (TENORMIN) 50 MG tablet TAKE 1 TABLET BY MOUTH TWICE DAILY 200 tablet 3  . atorvastatin (LIPITOR) 20 MG tablet Take 1 tablet (20 mg total) by mouth daily. 100 tablet 3  . Coenzyme Q10 (EQL COQ10) 300 MG CAPS Take by mouth 2 (two) times daily.    . fish oil-omega-3 fatty acids 1000 MG capsule Take 2 g by mouth daily.    . hydrochlorothiazide (HYDRODIURIL) 25 MG tablet TAKE 1 TABLET BY MOUTH Monday, Wednesday, and Friday 100 tablet 3  . levofloxacin (LEVAQUIN) 500 MG tablet Take 1 tablet (500 mg total) by mouth daily. 7 tablet 0  . lisinopril (PRINIVIL,ZESTRIL) 20 MG tablet TAKE 1 TABLET BY MOUTH DAILY 100 tablet 3  . Multiple Vitamin (MULTIVITAMIN) capsule Take 1 capsule by mouth daily. Silver men over  50    . nitroGLYCERIN (NITROSTAT) 0.4 MG SL tablet Place 1 tablet (0.4 mg total) under the tongue every 5 (five) minutes as needed. 25 tablet 1  . pantoprazole (PROTONIX) 40 MG tablet TAKE 1 TABLET BY MOUTH EVERY DAY 100 tablet 0  . potassium chloride (K-DUR) 10 MEQ tablet Take 1 tablet (10 mEq total) by mouth every Monday, Wednesday, and Friday. 100 tablet 3  . sildenafil (VIAGRA) 100 MG tablet Take 1 tablet (100 mg total) by mouth as needed for erectile  dysfunction. 6 tablet 11  . verapamil (CALAN-SR) 180 MG CR tablet TAKE 1 TABLET BY MOUTH EVERY NIGHT AT BEDTIME 100 tablet 3   No facility-administered medications prior to visit.    ROS Review of Systems  Constitutional: Negative.  Negative for fever, chills, diaphoresis and fatigue.  HENT: Negative.  Negative for facial swelling, sinus pressure, sore throat and trouble swallowing.   Eyes: Negative.   Respiratory: Negative.  Negative for cough, choking, chest tightness, shortness of breath and stridor.   Cardiovascular: Negative.  Negative for chest pain, palpitations and leg swelling.  Gastrointestinal: Negative.  Negative for nausea, vomiting, abdominal pain, diarrhea and constipation.  Endocrine: Negative.   Genitourinary: Negative.   Musculoskeletal: Negative.  Negative for myalgias and back pain.  Skin: Negative.  Negative for color change, pallor and rash.  Allergic/Immunologic: Negative.   Neurological: Negative.  Negative for dizziness, tremors, weakness, numbness and headaches.  Hematological: Negative.  Negative for adenopathy. Does not bruise/bleed easily.  Psychiatric/Behavioral: Negative.     Objective:  BP 140/86 mmHg  Pulse 61  Temp(Src) 98.7 F (37.1 C) (Oral)  Resp 16  Ht 5\' 5"  (1.651 m)  Wt 180 lb (81.647 kg)  BMI 29.95 kg/m2  SpO2 96%  BP Readings from Last 3 Encounters:  02/11/16 140/86  02/06/16 138/69  02/06/16  118/80    Wt Readings from Last 3 Encounters:  02/11/16 180 lb (81.647 kg)  02/06/16 182 lb (82.555 kg)  02/06/16 182 lb (82.555 kg)    Physical Exam  Constitutional: He is oriented to person, place, and time.  Non-toxic appearance. He does not have a sickly appearance. He does not appear ill. No distress.  HENT:  Mouth/Throat: Oropharynx is clear and moist. No oropharyngeal exudate.  Eyes: Conjunctivae are normal. Right eye exhibits no discharge. Left eye exhibits no discharge. No scleral icterus.  Neck: Normal range of motion. Neck  supple. No JVD present. No tracheal deviation present. No thyromegaly present.  Cardiovascular: Normal rate, regular rhythm, normal heart sounds and intact distal pulses.  Exam reveals no gallop and no friction rub.   No murmur heard. Pulmonary/Chest: Effort normal and breath sounds normal. No stridor. No respiratory distress. He has no wheezes. He has no rales. He exhibits no tenderness.  Abdominal: Soft. Bowel sounds are normal. He exhibits no distension and no mass. There is no tenderness. There is no rebound and no guarding.  Musculoskeletal: Normal range of motion. He exhibits no edema or tenderness.  Lymphadenopathy:    He has no cervical adenopathy.  Neurological: He is oriented to person, place, and time.  Skin: Skin is warm and dry. No rash noted. He is not diaphoretic. No erythema. No pallor.  Vitals reviewed.   Lab Results  Component Value Date   WBC 9.6 02/06/2016   HGB 12.5* 02/06/2016   HCT 36.8* 02/06/2016   PLT 170 02/06/2016   GLUCOSE 106* 02/06/2016   CHOL 131 02/06/2016   TRIG 86.0 02/06/2016   HDL 49.10 02/06/2016   LDLCALC 65 02/06/2016   ALT 16 02/06/2016   AST 18 02/06/2016   NA 138 02/06/2016   K 4.6 02/06/2016   CL 101 02/06/2016   CREATININE 1.27 02/06/2016   BUN 20 02/06/2016   CO2 30 02/06/2016   TSH 0.65 02/06/2016   PSA 1.93 01/18/2014   Results for [name], [name] (MRN [id number]) as of 02/11/2016 17:03  Ref. Range 02/06/2016 22:35  Appearance Latest Ref Range: CLEAR  CLOUDY (A)  Bacteria, UA Latest Ref Range: NONE SEEN  RARE (A)  Bilirubin Urine Latest Ref Range: NEGATIVE  NEGATIVE  Color, Urine Latest Ref Range: YELLOW  YELLOW  Glucose Latest Ref Range: NEGATIVE mg/dL NEGATIVE  Hgb urine dipstick Latest Ref Range: NEGATIVE  LARGE (A)  Ketones, ur Latest Ref Range: NEGATIVE mg/dL NEGATIVE  Leukocytes, UA Latest Ref Range: NEGATIVE  NEGATIVE  Nitrite Latest Ref Range: NEGATIVE  NEGATIVE  pH Latest Ref Range: 5.0-8.0  5.5  Protein Latest Ref  Range: NEGATIVE mg/dL NEGATIVE  RBC / HPF Latest Ref Range: 0-5 RBC/hpf 6-30  Specific Gravity, Urine Latest Ref Range: 1.005-1.030  1.013  Squamous Epithelial / LPF Latest Ref Range: NONE SEEN  0-5 (A)  WBC, UA Latest Ref Range: 0-5 WBC/hpf NONE SEEN   Dg Chest 2 View  02/06/2016  CLINICAL DATA:  Chills and fatigue after pneumonia shot.  Fever. EXAM: CHEST  2 VIEW COMPARISON:  01/07/2007 FINDINGS: Surgical plate in the lower cervical spine. Both lungs are clear. Heart and mediastinum are within normal limits. No pleural effusions. No acute bone abnormality. IMPRESSION: No active cardiopulmonary disease. Electronically Signed   By: Markus Daft M.D.   On: 02/06/2016 22:46    Assessment & Plan:   Momar was seen today for fever.  Diagnoses and all orders for this visit:  Acute cystitis  without hematuria- His UA was positive for red blood cells, the culture was negative, he has had an excellent response to the fluoroquinolone, I've asked him to continue the course and let me know if he develops any new symptoms.  I am having Mr. Kehr maintain his aspirin, fish oil-omega-3 fatty acids, multivitamin, Coenzyme Q10, nitroGLYCERIN, sildenafil, pantoprazole, atorvastatin, hydrochlorothiazide, lisinopril, verapamil, atenolol, potassium chloride, and levofloxacin.  No orders of the defined types were placed in this encounter.     Follow-up: No Follow-up on file.  Scarlette Calico, MD

## 2016-02-11 NOTE — Progress Notes (Signed)
Pre visit review using our clinic review tool, if applicable. No additional management support is needed unless otherwise documented below in the visit note. 

## 2016-02-11 NOTE — Patient Instructions (Signed)

## 2016-03-03 ENCOUNTER — Other Ambulatory Visit: Payer: Self-pay | Admitting: Family Medicine

## 2016-03-12 ENCOUNTER — Other Ambulatory Visit: Payer: Self-pay

## 2016-03-28 ENCOUNTER — Encounter: Payer: Self-pay | Admitting: Gastroenterology

## 2016-03-28 ENCOUNTER — Ambulatory Visit (AMBULATORY_SURGERY_CENTER): Payer: Self-pay

## 2016-03-28 VITALS — Ht 66.0 in | Wt 182.0 lb

## 2016-03-28 DIAGNOSIS — K2270 Barrett's esophagus without dysplasia: Secondary | ICD-10-CM

## 2016-04-04 DIAGNOSIS — H2513 Age-related nuclear cataract, bilateral: Secondary | ICD-10-CM | POA: Diagnosis not present

## 2016-04-04 DIAGNOSIS — I10 Essential (primary) hypertension: Secondary | ICD-10-CM | POA: Diagnosis not present

## 2016-04-04 DIAGNOSIS — H18413 Arcus senilis, bilateral: Secondary | ICD-10-CM | POA: Diagnosis not present

## 2016-04-04 DIAGNOSIS — H25043 Posterior subcapsular polar age-related cataract, bilateral: Secondary | ICD-10-CM | POA: Diagnosis not present

## 2016-04-04 DIAGNOSIS — H353130 Nonexudative age-related macular degeneration, bilateral, stage unspecified: Secondary | ICD-10-CM | POA: Diagnosis not present

## 2016-04-08 DIAGNOSIS — N5201 Erectile dysfunction due to arterial insufficiency: Secondary | ICD-10-CM | POA: Diagnosis not present

## 2016-04-08 DIAGNOSIS — R3121 Asymptomatic microscopic hematuria: Secondary | ICD-10-CM | POA: Diagnosis not present

## 2016-04-08 DIAGNOSIS — R351 Nocturia: Secondary | ICD-10-CM | POA: Diagnosis not present

## 2016-04-08 DIAGNOSIS — N401 Enlarged prostate with lower urinary tract symptoms: Secondary | ICD-10-CM | POA: Diagnosis not present

## 2016-04-11 ENCOUNTER — Ambulatory Visit (AMBULATORY_SURGERY_CENTER): Payer: PPO | Admitting: Gastroenterology

## 2016-04-11 ENCOUNTER — Encounter: Payer: Self-pay | Admitting: Gastroenterology

## 2016-04-11 VITALS — BP 107/62 | HR 49 | Temp 97.1°F | Resp 18 | Ht 66.0 in | Wt 182.0 lb

## 2016-04-11 DIAGNOSIS — E669 Obesity, unspecified: Secondary | ICD-10-CM | POA: Diagnosis not present

## 2016-04-11 DIAGNOSIS — K2270 Barrett's esophagus without dysplasia: Secondary | ICD-10-CM | POA: Diagnosis not present

## 2016-04-11 DIAGNOSIS — I2510 Atherosclerotic heart disease of native coronary artery without angina pectoris: Secondary | ICD-10-CM | POA: Diagnosis not present

## 2016-04-11 DIAGNOSIS — K219 Gastro-esophageal reflux disease without esophagitis: Secondary | ICD-10-CM | POA: Diagnosis not present

## 2016-04-11 DIAGNOSIS — I10 Essential (primary) hypertension: Secondary | ICD-10-CM | POA: Diagnosis not present

## 2016-04-11 MED ORDER — SODIUM CHLORIDE 0.9 % IV SOLN: 500.0000 mL | INTRAVENOUS | Status: DC

## 2016-04-11 NOTE — Progress Notes (Signed)
Report to PACU, RN, vss, BBS= Clear.  

## 2016-04-11 NOTE — Patient Instructions (Signed)
YOU HAD AN ENDOSCOPIC PROCEDURE TODAY AT [facility]:   Refer to the procedure report that was given to you for any specific questions about what was found during the examination.  If the procedure report does not answer your questions, please call your gastroenterologist to clarify.  If you requested that your care partner not be given the details of your procedure findings, then the procedure report has been included in a sealed envelope for you to review at your convenience later.  YOU SHOULD EXPECT: Some feelings of bloating in the abdomen. Passage of more gas than usual.  Walking can help get rid of the air that was put into your GI tract during the procedure and reduce the bloating. If you had a lower endoscopy (such as a colonoscopy or flexible sigmoidoscopy) you may notice spotting of blood in your stool or on the toilet paper. If you underwent a bowel prep for your procedure, you may not have a normal bowel movement for a few days.  Please Note:  You might notice some irritation and congestion in your nose or some drainage.  This is from the oxygen used during your procedure.  There is no need for concern and it should clear up in a day or so.  SYMPTOMS TO REPORT IMMEDIATELY:   Following lower endoscopy (colonoscopy or flexible sigmoidoscopy):  Excessive amounts of blood in the stool  Significant tenderness or worsening of abdominal pains  Swelling of the abdomen that is new, acute  Fever of 100F or higher   Following upper endoscopy (EGD)  Vomiting of blood or coffee ground material  New chest pain or pain under the shoulder blades  Painful or persistently difficult swallowing  New shortness of breath  Fever of 100F or higher  Black, tarry-looking stools  For urgent or emergent issues, a gastroenterologist can be reached at any hour by calling [phone].   DIET:  We do recommend a small meal at first, but then you may proceed to your regular diet.  Drink  plenty of fluids but you should avoid alcoholic beverages for 24 hours.  ACTIVITY:  You should plan to take it easy for the rest of today and you should NOT DRIVE or use heavy machinery until tomorrow (because of the sedation medicines used during the test).    FOLLOW UP: Our staff will call the number listed on your records the next business day following your procedure to check on you and address any questions or concerns that you may have regarding the information given to you following your procedure. If we do not reach you, we will leave a message.  However, if you are feeling well and you are not experiencing any problems, there is no need to return our call.  We will assume that you have returned to your regular daily activities without incident.  If any biopsies were taken you will be contacted by phone or by letter within the next 1-3 weeks.  Please call us at [phone] if you have not heard about the biopsies in 3 weeks.    SIGNATURES/CONFIDENTIALITY: You and/or your care partner have signed paperwork which will be entered into your electronic medical record.  These signatures attest to the fact that that the information above on your After Visit Summary has been reviewed and is understood.  Full responsibility of the confidentiality of this discharge information lies with you and/or your care-partner.  Hiatal hernia information given. Repeat endoscopy in 3 years for serveillance  pending pathology review.

## 2016-04-11 NOTE — Op Note (Signed)
[facility] Patient Name: [name] Procedure Date: 04/11/2016 9:06 AM MRN: [id number] Endoscopist: Ladene Artist , MD Age: 75 Referring MD:  Date of Birth: 05/14/1941 Gender: Male Account #: [id number] Procedure:                Upper GI endoscopy Indications:              Screening for Barrett's esophagus Medicines:                Monitored Anesthesia Care Procedure:                Pre-Anesthesia Assessment:                           - Prior to the procedure, a History and Physical                            was performed, and patient medications and                            allergies were reviewed. The patient's tolerance of                            previous anesthesia was also reviewed. The risks                            and benefits of the procedure and the sedation                            options and risks were discussed with the patient.                            All questions were answered, and informed consent                            was obtained. Prior Anticoagulants: The patient has                            taken no previous anticoagulant or antiplatelet                            agents. ASA Grade Assessment: II - A patient with                            mild systemic disease. After reviewing the risks                            and benefits, the patient was deemed in                            satisfactory condition to undergo the procedure.                           After obtaining informed consent, the endoscope was  passed under direct vision. Throughout the                            procedure, the patient's blood pressure, pulse, and                            oxygen saturations were monitored continuously. The                            Model [id number]-[id number]) scope was introduced                            through the mouth, and advanced to the second part                            of duodenum. The  upper GI endoscopy was                            accomplished without difficulty. The patient                            tolerated the procedure well. Scope In: Scope Out: Findings:                 The esophagus and gastroesophageal junction were                            examined with white light and narrow band imaging                            (NBI) from a forward view and retroflexed position.                            There were esophageal mucosal changes secondary to                            established long-segment Barrett's disease. These                            changes involved the mucosa at the upper extent of                            the gastric folds (34 cm from the incisors)                            extending to the Z-line (28 cm from the incisors).                            Circumferential salmon-colored mucosa was present                            from 28 to 34 cm. The maximum longitudinal extent  of these esophageal mucosal changes was 6 cm in                            length. Mucosa was biopsied with a cold forceps for                            histology in 4 quadrants and randomly at intervals                            of 1.5 cm in the lower third of the esophagus. One                            specimen bottle was sent to pathology.                           The exam of the esophagus was otherwise normal.                           A few non-bleeding localized linear erosions were                            found in the gastric fundus. There were no stigmata                            of recent bleeding.                           A medium-sized hiatal hernia was present.                           The exam of the stomach was otherwise normal.                           The duodenal bulb and second portion of the                            duodenum were normal. Complications:            No immediate complications. Estimated blood  loss:                            Minimal. Estimated Blood Loss:     Estimated blood loss was minimal. Impression:               - Esophageal mucosal changes secondary to                            established long-segment Barrett's disease.                            Biopsied.                           - Cameron erosions without bleeding.                           -  Medium-sized hiatal hernia.                           - Normal duodenal bulb and second portion of the                            duodenum. Recommendation:           - Patient has a contact number available for                            emergencies. The signs and symptoms of potential                            delayed complications were discussed with the                            patient. Return to normal activities tomorrow.                            Written discharge instructions were provided to the                            patient.                           - Resume previous diet.                           - Continue present medications.                           - Await pathology results.                           - Repeat upper endoscopy in 3 years for                            surveillance pending pathology review. Ladene Artist, MD 04/11/2016 9:36:00 AM This report has been signed electronically.

## 2016-04-14 ENCOUNTER — Telehealth: Payer: Self-pay | Admitting: *Deleted

## 2016-04-14 NOTE — Telephone Encounter (Signed)
  Follow up Call-  Call back number 04/11/2016  Post procedure Call Back phone  # [phone] hm  Permission to leave phone message Yes  Some recent data might be hidden     Patient questions:  Do you have a fever, pain , or abdominal swelling? No. Pain Score  0 *  Have you tolerated food without any problems? Yes.    Have you been able to return to your normal activities? Yes.    Do you have any questions about your discharge instructions: Diet   No. Medications  No. Follow up visit  No.  Do you have questions or concerns about your Care? No.  Actions: * If pain score is 4 or above: No action needed, pain <4.

## 2016-04-17 ENCOUNTER — Encounter: Payer: Self-pay | Admitting: Cardiology

## 2016-04-28 NOTE — Progress Notes (Signed)
HPI The patient presents for follow up of his CAD. I Since I last saw him he has done extremely well. He's able to do a lot of walking and exercising at least 3 times per week. The patient denies any new symptoms such as chest discomfort, neck or arm discomfort. There has been no new shortness of breath, PND or orthopnea. There have been no reported palpitations, presyncope or syncope.   No Known Allergies  Current Outpatient Prescriptions  Medication Sig Dispense Refill  . aspirin 81 MG EC tablet Take 81 mg by mouth daily.      Marland Kitchen atenolol (TENORMIN) 50 MG tablet TAKE 1 TABLET BY MOUTH TWICE DAILY 200 tablet 3  . atorvastatin (LIPITOR) 20 MG tablet Take 1 tablet (20 mg total) by mouth daily. 100 tablet 3  . Coenzyme Q10 (EQL COQ10) 200 MG capsule Take by mouth 2 (two) times daily.    . fish oil-omega-3 fatty acids 1000 MG capsule Take 2 g by mouth daily.    . hydrochlorothiazide (HYDRODIURIL) 25 MG tablet TAKE 1 TABLET BY MOUTH Monday, Wednesday, and Friday 100 tablet 3  . lisinopril (PRINIVIL,ZESTRIL) 20 MG tablet TAKE 1 TABLET BY MOUTH DAILY 100 tablet 3  . Multiple Vitamin (MULTIVITAMIN) capsule Take 1 capsule by mouth daily. Silver men over  50    . nitroGLYCERIN (NITROSTAT) 0.4 MG SL tablet Place 1 tablet (0.4 mg total) under the tongue every 5 (five) minutes as needed. 25 tablet 1  . pantoprazole (PROTONIX) 40 MG tablet TAKE 1 TABLET BY MOUTH EVERY DAY 100 tablet 0  . potassium chloride (K-DUR) 10 MEQ tablet Take 1 tablet (10 mEq total) by mouth every Monday, Wednesday, and Friday. (Patient taking differently: Take 20 mEq by mouth every Monday, Wednesday, and Friday. ) 100 tablet 3  . sildenafil (VIAGRA) 100 MG tablet Take 1 tablet (100 mg total) by mouth as needed for erectile dysfunction. 6 tablet 11  . verapamil (CALAN-SR) 180 MG CR tablet TAKE 1 TABLET BY MOUTH EVERY NIGHT AT BEDTIME 100 tablet 3   Current Facility-Administered Medications  Medication Dose Route Frequency  Provider Last Rate Last Dose  . 0.9 %  sodium chloride infusion  500 mL Intravenous Continuous Ladene Artist, MD        Past Medical History:  Diagnosis Date  . Anemia   . Barrett's esophagus   . Blood transfusion without reported diagnosis   . CAD (coronary artery disease)    2002 stenting of the RCA,, 2006 Stenting of diag with DES, PTCA of Circ, 50% LAD  . Diverticulosis   . DJD (degenerative joint disease)   . Duodenal ulcer   . ED (erectile dysfunction)   . GERD (gastroesophageal reflux disease)   . Hemorrhoids   . Hyperlipidemia   . Hypertension   . Myocardial infarction [facility])     Past Surgical History:  Procedure Laterality Date  . COLONOSCOPY    . SPINAL FUSION    . SPINAL FUSION    . SPINE SURGERY     cervical  . UPPER GASTROINTESTINAL ENDOSCOPY      ROS   Mild resting tremor.  Otherwise as stated in the HPI and negative for all other systems.  PHYSICAL EXAM BP 112/72   Pulse (!) 49   Ht 5\' 6"  (1.676 m)   Wt 180 lb 3.2 oz (81.7 kg)   BMI 29.09 kg/m  GENERAL:  Well appearing NECK:  No jugular venous distention, waveform within normal limits, carotid  upstroke brisk and symmetric, no bruits, no thyromegaly LUNGS:  Clear to auscultation bilaterally BACK:  No CVA tenderness CHEST:  Unremarkable HEART:  PMI not displaced or sustained,S1 and S2 within normal limits, no S3, no S4, no clicks, no rubs, no murmurs ABD:  Flat, positive bowel sounds normal in frequency in pitch, no bruits, no rebound, no guarding, no midline pulsatile mass, no hepatomegaly, no splenomegaly EXT:  2 plus pulses throughout, no edema, no cyanosis no clubbing NEURO:  Mild left hand resting tremor.   Lab Results  Component Value Date   CHOL 131 02/06/2016   TRIG 86.0 02/06/2016   HDL 49.10 02/06/2016   LDLCALC 65 02/06/2016    EKG:  Sinus bradycardia, rate 49, axis within normal limits, intervals WNL, no acute ST T wave changes.  05/01/2016  ASSESSMENT AND PLAN  CORONARY ARTERY  DISEASE, S/P PTCA -  The patient has no new sypmtoms since stress testing in 2013.  No further cardiovascular testing is indicated.  We will continue with aggressive risk reduction and meds as listed.    HYPERLIPIDEMIA -  His LDL was 65 in June.   I will continue him on the same therapy.   HYPERTENSION -  The blood pressure is at target. No change in medications is indicated. We will continue with therapeutic lifestyle changes (TLC).    CKD:   This improved with reduced HCTZ.

## 2016-04-29 ENCOUNTER — Encounter: Payer: Self-pay | Admitting: Gastroenterology

## 2016-05-01 ENCOUNTER — Ambulatory Visit (INDEPENDENT_AMBULATORY_CARE_PROVIDER_SITE_OTHER): Payer: PPO | Admitting: Cardiology

## 2016-05-01 ENCOUNTER — Encounter: Payer: Self-pay | Admitting: Cardiology

## 2016-05-01 VITALS — BP 112/72 | HR 49 | Ht 66.0 in | Wt 180.2 lb

## 2016-05-01 DIAGNOSIS — I2510 Atherosclerotic heart disease of native coronary artery without angina pectoris: Secondary | ICD-10-CM

## 2016-05-01 DIAGNOSIS — I10 Essential (primary) hypertension: Secondary | ICD-10-CM

## 2016-05-01 NOTE — Patient Instructions (Signed)

## 2016-05-05 NOTE — Addendum Note (Signed)
Addended by: Vennie Homans on: 05/05/2016 09:44 AM   Modules accepted: Orders

## 2016-05-08 ENCOUNTER — Ambulatory Visit (INDEPENDENT_AMBULATORY_CARE_PROVIDER_SITE_OTHER): Payer: PPO

## 2016-05-08 DIAGNOSIS — Z23 Encounter for immunization: Secondary | ICD-10-CM | POA: Diagnosis not present

## 2016-05-16 ENCOUNTER — Encounter: Payer: Self-pay | Admitting: Internal Medicine

## 2016-05-16 NOTE — Telephone Encounter (Signed)
Any other recommendations???

## 2016-05-16 NOTE — Telephone Encounter (Signed)
I would recommend visit as this is not typical for flu shot. This does not give you the flu and most viruses do not last >10 days as this case is.

## 2016-05-17 NOTE — Telephone Encounter (Signed)
Called pt. He stated that he is feeling much better. His temperature is still going down toward the normal range. Instructed pt to continue the OTC meds. Pt wife is doing a great job taking care of him. Pt stated understanding and appreciated our call back.

## 2016-05-29 ENCOUNTER — Other Ambulatory Visit: Payer: Self-pay | Admitting: Internal Medicine

## 2016-06-10 ENCOUNTER — Other Ambulatory Visit: Payer: Self-pay | Admitting: Internal Medicine

## 2016-06-10 ENCOUNTER — Telehealth: Payer: Self-pay | Admitting: Internal Medicine

## 2016-06-10 DIAGNOSIS — G252 Other specified forms of tremor: Secondary | ICD-10-CM

## 2016-06-10 NOTE — Telephone Encounter (Signed)
Patient is requesting a referral for Dr. Carles Collet, Neurological. Due to the problems he has with tremors. Please follow up with patient.  Thank you.

## 2016-06-10 NOTE — Telephone Encounter (Signed)
Referral ordered

## 2016-06-10 NOTE — Telephone Encounter (Signed)
See pt request 

## 2016-06-27 NOTE — Progress Notes (Signed)
Subjective:   [name] was seen in consultation in the movement disorder clinic at the request of Steven Calico, MD.  The evaluation is for tremor.  Tremor started approximately 25 year ago but has been worse over the last year.  It involves the the L and R hand but the L is worse than the R.  He is R hand dominant.  Tremor is most noticeable when he picks up a cup of coffee and he will spill it all over.   There is no known family hx of tremor.    Affected by caffeine:  Thinks it may (1 cup coffee/day and starting to drink decaf) Affected by alcohol:  Doesn't drink EtOH Affected by stress:  Yes.   Affected by fatigue:  No. Spills soup if on spoon:  No. but he eats with R hand and L is worse Spills glass of liquid if full:  Yes.   Affects ADL's (tying shoes, brushing teeth, etc):  No., but may affect buttoning of cuff buttons  Current/Previously tried tremor medications: none (on atenolol x 25 years but for BP)  Current medications that may exacerbate tremor:  n/a  Neuroimaging of brain has never been performed.  Outside reports reviewed: lab reports and referral letter/letters.  No Known Allergies  Outpatient Encounter Prescriptions as of 06/30/2016  Medication Sig  . aspirin 81 MG EC tablet Take 81 mg by mouth daily.    Marland Kitchen atenolol (TENORMIN) 50 MG tablet TAKE 1 TABLET BY MOUTH TWICE DAILY  . atorvastatin (LIPITOR) 20 MG tablet Take 1 tablet (20 mg total) by mouth daily.  . Coenzyme Q10 (EQL COQ10) 200 MG capsule Take by mouth 2 (two) times daily.  . fish oil-omega-3 fatty acids 1000 MG capsule Take 2 g by mouth daily.  . hydrochlorothiazide (HYDRODIURIL) 25 MG tablet TAKE 1 TABLET BY MOUTH Monday, Wednesday, and Friday  . lisinopril (PRINIVIL,ZESTRIL) 20 MG tablet TAKE 1 TABLET BY MOUTH DAILY  . Multiple Vitamin (MULTIVITAMIN) capsule Take 1 capsule by mouth daily. Silver men over  50  . pantoprazole (PROTONIX) 40 MG tablet TAKE 1 TABLET BY MOUTH EVERY DAY  . potassium  chloride (K-DUR) 10 MEQ tablet Take 1 tablet (10 mEq total) by mouth every Monday, Wednesday, and Friday. (Patient taking differently: Take 20 mEq by mouth every Monday, Wednesday, and Friday. )  . sildenafil (VIAGRA) 100 MG tablet Take 1 tablet (100 mg total) by mouth as needed for erectile dysfunction.  . verapamil (CALAN-SR) 180 MG CR tablet TAKE 1 TABLET BY MOUTH EVERY NIGHT AT BEDTIME  . nitroGLYCERIN (NITROSTAT) 0.4 MG SL tablet Place 1 tablet (0.4 mg total) under the tongue every 5 (five) minutes as needed. (Patient not taking: Reported on 06/30/2016)   Facility-Administered Encounter Medications as of 06/30/2016  Medication  . 0.9 %  sodium chloride infusion    Past Medical History:  Diagnosis Date  . Anemia   . Barrett's esophagus   . Blood transfusion without reported diagnosis   . CAD (coronary artery disease)    2002 stenting of the RCA,, 2006 Stenting of diag with DES, PTCA of Circ, 50% LAD  . Diverticulosis   . DJD (degenerative joint disease)   . Duodenal ulcer   . ED (erectile dysfunction)   . GERD (gastroesophageal reflux disease)   . Hemorrhoids   . Hyperlipidemia   . Hypertension   . Myocardial infarction     Past Surgical History:  Procedure Laterality Date  . COLONOSCOPY    .  ELBOW ARTHROSCOPY    . SPINAL FUSION    . SPINAL FUSION    . SPINE SURGERY     cervical  . UPPER GASTROINTESTINAL ENDOSCOPY      Social History   Social History  . Marital status: Married    Spouse name: N/A  . Number of children: N/A  . Years of education: N/A   Occupational History  . 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 Smoking status: Former Smoker    Quit date: 08/25/1961  . Smokeless tobacco: Never Used  . Alcohol use No     Comment: rarely  . Drug use: No  . Sexual activity: Not on file   Other Topics Concern  . Not on file   Social History Narrative  . No narrative on file    Family Status  Relation Status  .    .    .    . Mother  Deceased  . Father Deceased  . Daughter Alive  . Son Alive  . Daughter Alive  . Neg Hx     Review of Systems A complete 10 system ROS was obtained and was negative apart from what is mentioned.   Objective:   VITALS:   Vitals:   06/30/16 1412  BP: 134/78  Pulse: (!) 58  Weight: 180 lb (81.6 kg)  Height: 5\' 6"  (1.676 m)   Gen:  Appears stated age and in NAD. HEENT:  Normocephalic, atraumatic. The mucous membranes are moist. The superficial temporal arteries are without ropiness or tenderness. Cardiovascular:  Bradycardic with regular rhythm. Lungs: Clear to auscultation bilaterally. Neck: There are no carotid bruits noted bilaterally.  NEUROLOGICAL:  Orientation:  The patient is alert and oriented x 3.  Recent and remote memory are intact.  Attention span and concentration are normal.  Able to name objects and repeat without trouble.  Fund of knowledge is appropriate Cranial nerves: There is good facial symmetry. The pupils are equal round and reactive to light bilaterally. Fundoscopic exam reveals clear disc margins bilaterally. Extraocular muscles are intact and visual fields are full to confrontational testing. Speech is fluent and clear. Soft palate rises symmetrically and there is no tongue deviation. Hearing is intact to conversational tone. Tone: Tone is good throughout. Sensation: Sensation is intact to light touch and pinprick throughout (facial, trunk, extremities). Vibration is absent at the bilateral big toe but intact at the bilateral ankle. There is no extinction with double simultaneous stimulation. There is no sensory dermatomal level identified. Coordination:  The patient has no dysdiadichokinesia or dysmetria. Motor: Strength is 5/5 in the bilateral upper and lower extremities.  Shoulder shrug is equal bilaterally.  There is no pronator drift.  There are no fasciculations noted. DTR's: Deep tendon reflexes are 2-/4 at the bilateral biceps, triceps, brachioradialis,  patella and trace at the bilateral achilles.  Plantar responses are downgoing bilaterally. Gait and Station: The patient is able to ambulate without difficulty. He does have difficulty ambulating in a tandem fashion.  He is able to walk on his toes.  He has minimal difficulty walking on his heels.  He is able to stand in the Romberg position with eyes open and closed.  MOVEMENT EXAM: Tremor:  There is tremor in the UE, noted most significantly with action.  The left is worse than the right.  He does have a resting component to the tremor that is intermittent on the left.  He has trouble with  Archimedes spirals, worse on the left.  He spills water when asked to pour water from one glass to another, and it is worse when the water is in the left hand.     Labs:  Lab Results  Component Value Date   TSH 0.65 02/06/2016     Chemistry      Component Value Date/Time   NA 138 02/06/2016 0910   K 4.6 02/06/2016 0910   CL 101 02/06/2016 0910   CO2 30 02/06/2016 0910   BUN 20 02/06/2016 0910   CREATININE 1.27 02/06/2016 0910      Component Value Date/Time   CALCIUM 9.7 02/06/2016 0910   ALKPHOS 58 02/06/2016 0910   AST 18 02/06/2016 0910   ALT 16 02/06/2016 0910   BILITOT 0.5 02/06/2016 0910     No results found for: [id number]   Assessment/Plan:   1.  Essential Tremor.  -This is evidenced by the symmetrical nature and longstanding hx of gradually getting worse.  The left hand is worse than the right, but he is right-hand dominant which is good for him.  We discussed nature and pathophysiology.  We discussed that this can continue to gradually get worse with time.  We discussed that some medications can worsen this, as can caffeine use.  We discussed medication therapy as well as surgical therapy.  Ultimately, the patient decided to try primidone.  He is already on atenolol, but is also already bradycardic so I am not sure that I can really raises medication or change it much to propranolol.   We'll start with low-dose primidone, 50 mg daily.  Discussed risks, benefits, and side effects.  I will plan on seeing him back in the next few months, sooner should new neurologic issues arise.  Much greather than 50% of the 45 minute visit was spent in counseling with the patient.  CC:  Steven Calico, MD

## 2016-06-30 ENCOUNTER — Encounter: Payer: Self-pay | Admitting: Neurology

## 2016-06-30 ENCOUNTER — Ambulatory Visit (INDEPENDENT_AMBULATORY_CARE_PROVIDER_SITE_OTHER): Payer: PPO | Admitting: Neurology

## 2016-06-30 VITALS — BP 134/78 | HR 58 | Ht 66.0 in | Wt 180.0 lb

## 2016-06-30 DIAGNOSIS — G250 Essential tremor: Secondary | ICD-10-CM

## 2016-06-30 MED ORDER — PRIMIDONE 50 MG PO TABS: 50.0000 mg | ORAL_TABLET | Freq: Every day | ORAL | 3 refills | Status: DC

## 2016-06-30 NOTE — Patient Instructions (Signed)
1. Start Primidone 50 mg tablets. Take 1/2 tablet for 4 nights, then increase to 1 tablet. Prescription has been sent to your pharmacy. The first dose of medication can cause some dizziness/nausea that should go away after the first dose.   

## 2016-08-28 ENCOUNTER — Ambulatory Visit: Payer: PPO | Admitting: Internal Medicine

## 2016-09-22 ENCOUNTER — Ambulatory Visit (INDEPENDENT_AMBULATORY_CARE_PROVIDER_SITE_OTHER): Payer: PPO | Admitting: Internal Medicine

## 2016-09-22 ENCOUNTER — Encounter: Payer: Self-pay | Admitting: Internal Medicine

## 2016-09-22 ENCOUNTER — Other Ambulatory Visit (INDEPENDENT_AMBULATORY_CARE_PROVIDER_SITE_OTHER): Payer: PPO

## 2016-09-22 VITALS — BP 122/82 | HR 55 | Temp 98.0°F | Resp 16 | Ht 66.0 in | Wt 176.1 lb

## 2016-09-22 DIAGNOSIS — I10 Essential (primary) hypertension: Secondary | ICD-10-CM

## 2016-09-22 DIAGNOSIS — E785 Hyperlipidemia, unspecified: Secondary | ICD-10-CM

## 2016-09-22 DIAGNOSIS — D539 Nutritional anemia, unspecified: Secondary | ICD-10-CM | POA: Insufficient documentation

## 2016-09-22 DIAGNOSIS — I2510 Atherosclerotic heart disease of native coronary artery without angina pectoris: Secondary | ICD-10-CM

## 2016-09-22 DIAGNOSIS — Z23 Encounter for immunization: Secondary | ICD-10-CM

## 2016-09-22 DIAGNOSIS — R0789 Other chest pain: Secondary | ICD-10-CM | POA: Diagnosis not present

## 2016-09-22 LAB — COMPREHENSIVE METABOLIC PANEL
ALBUMIN: 4.4 g/dL (ref 3.5–5.2)
ALT: 17 U/L (ref 0–53)
AST: 19 U/L (ref 0–37)
Alkaline Phosphatase: 68 U/L (ref 39–117)
BUN: 16 mg/dL (ref 6–23)
CALCIUM: 9.8 mg/dL (ref 8.4–10.5)
CHLORIDE: 102 meq/L (ref 96–112)
CO2: 29 mEq/L (ref 19–32)
CREATININE: 1.3 mg/dL (ref 0.40–1.50)
GFR: 56.74 mL/min — AB (ref >60.00)
Glucose, Bld: 96 mg/dL (ref 70–99)
Potassium: 4.7 mEq/L (ref 3.5–5.1)
Sodium: 138 mEq/L (ref 135–145)
Total Bilirubin: 0.4 mg/dL (ref 0.2–1.2)
Total Protein: 7.1 g/dL (ref 6.0–8.3)

## 2016-09-22 LAB — CBC WITH DIFFERENTIAL/PLATELET
BASOS ABS: 0.1 10*3/uL (ref 0.0–0.1)
BASOS PCT: 0.8 % (ref 0.0–3.0)
EOS ABS: 0.2 10*3/uL (ref 0.0–0.7)
Eosinophils Relative: 2.8 % (ref 0.0–5.0)
HEMATOCRIT: 44 % (ref 39.0–52.0)
HEMOGLOBIN: 14.8 g/dL (ref 13.0–17.0)
Lymphocytes Relative: 42 % (ref 12.0–46.0)
Lymphs Abs: 3 10*3/uL (ref 0.7–4.0)
MCHC: 33.7 g/dL (ref 30.0–36.0)
MCV: 92.3 fl (ref 78.0–100.0)
Monocytes Absolute: 0.6 10*3/uL (ref 0.1–1.0)
Monocytes Relative: 8.4 % (ref 3.0–12.0)
Neutro Abs: 3.3 10*3/uL (ref 1.4–7.7)
Neutrophils Relative %: 46 % (ref 43.0–77.0)
Platelets: 227 10*3/uL (ref 150.0–400.0)
RBC: 4.77 Mil/uL (ref 4.22–5.81)
RDW: 13.2 % (ref 11.5–15.5)
WBC: 7.2 10*3/uL (ref 4.0–10.5)

## 2016-09-22 LAB — TSH: TSH: 0.7 u[IU]/mL (ref 0.35–4.50)

## 2016-09-22 LAB — FERRITIN: Ferritin: 30.7 ng/mL (ref 22.0–322.0)

## 2016-09-22 LAB — IBC PANEL
IRON: 116 ug/dL (ref 42–165)
Saturation Ratios: 30.7 % (ref 20.0–50.0)
Transferrin: 270 mg/dL (ref 212.0–360.0)

## 2016-09-22 LAB — VITAMIN B12: Vitamin B-12: 332 pg/mL (ref 211–911)

## 2016-09-22 LAB — LIPID PANEL
CHOL/HDL RATIO: 3
Cholesterol: 130 mg/dL (ref 0–200)
HDL: 43.9 mg/dL (ref >39.00)
LDL Cholesterol: 66 mg/dL (ref 0–99)
NONHDL: 85.84
Triglycerides: 99 mg/dL (ref 0.0–149.0)
VLDL: 19.8 mg/dL (ref 0.0–40.0)

## 2016-09-22 LAB — FOLATE

## 2016-09-22 NOTE — Progress Notes (Signed)
Subjective:  Patient ID: [name], male    DOB: 10/01/1940  Age: 76 y.o. MRN: [id number]  CC: Anemia; Hypertension; and Hyperlipidemia   HPI [name] presents for follow-up on the above medical problems. About 2 weeks ago he had an episode of fatigue and heaviness in his chest that occurred at rest. He has felt well since then with no ongoing episodes of chest pain, DOE, shortness of breath, palpitations, or edema. He has an point with his cardiologist next week.  Outpatient Medications Prior to Visit  Medication Sig Dispense Refill  . aspirin 81 MG EC tablet Take 81 mg by mouth daily.      Marland Kitchen atenolol (TENORMIN) 50 MG tablet TAKE 1 TABLET BY MOUTH TWICE DAILY 200 tablet 3  . atorvastatin (LIPITOR) 20 MG tablet Take 1 tablet (20 mg total) by mouth daily. 100 tablet 3  . fish oil-omega-3 fatty acids 1000 MG capsule Take 2 g by mouth daily.    . hydrochlorothiazide (HYDRODIURIL) 25 MG tablet TAKE 1 TABLET BY MOUTH Monday, Wednesday, and Friday 100 tablet 3  . lisinopril (PRINIVIL,ZESTRIL) 20 MG tablet TAKE 1 TABLET BY MOUTH DAILY 100 tablet 3  . nitroGLYCERIN (NITROSTAT) 0.4 MG SL tablet Place 1 tablet (0.4 mg total) under the tongue every 5 (five) minutes as needed. 25 tablet 1  . pantoprazole (PROTONIX) 40 MG tablet TAKE 1 TABLET BY MOUTH EVERY DAY 90 tablet 2  . potassium chloride (K-DUR) 10 MEQ tablet Take 1 tablet (10 mEq total) by mouth every Monday, Wednesday, and Friday. (Patient taking differently: Take 20 mEq by mouth every Monday, Wednesday, and Friday. ) 100 tablet 3  . primidone (MYSOLINE) 50 MG tablet Take 1 tablet (50 mg total) by mouth at bedtime. 30 tablet 3  . sildenafil (VIAGRA) 100 MG tablet Take 1 tablet (100 mg total) by mouth as needed for erectile dysfunction. 6 tablet 11  . verapamil (CALAN-SR) 180 MG CR tablet TAKE 1 TABLET BY MOUTH EVERY NIGHT AT BEDTIME 100 tablet 3  . Coenzyme Q10 (EQL COQ10) 200 MG capsule Take by mouth 2 (two) times daily.    .  Multiple Vitamin (MULTIVITAMIN) capsule Take 1 capsule by mouth daily. Silver men over  50    . 0.9 %  sodium chloride infusion      No facility-administered medications prior to visit.     ROS Review of Systems  Constitutional: Positive for fatigue. Negative for appetite change, chills, diaphoresis, fever and unexpected weight change.  HENT: Negative.   Eyes: Negative for visual disturbance.  Respiratory: Negative for chest tightness, shortness of breath and wheezing.   Cardiovascular: Negative for chest pain, palpitations and leg swelling.  Gastrointestinal: Negative for abdominal pain, constipation, diarrhea, nausea and vomiting.  Endocrine: Negative for cold intolerance and heat intolerance.  Genitourinary: Negative for difficulty urinating, dysuria, hematuria and urgency.  Musculoskeletal: Negative.  Negative for back pain, myalgias and neck pain.  Skin: Negative.  Negative for color change.  Allergic/Immunologic: Negative.   Neurological: Negative.  Negative for dizziness, weakness, light-headedness and headaches.  Hematological: Negative for adenopathy. Does not bruise/bleed easily.  Psychiatric/Behavioral: Negative.     Objective:  BP 122/82 (BP Location: Left Arm, Patient Position: Sitting, Cuff Size: Normal)   Pulse (!) 55   Temp 98 F (36.7 C) (Oral)   Resp 16   Ht 5\' 6"  (1.676 m)   Wt 176 lb 1.9 oz (79.9 kg)   SpO2 95%   BMI 28.43 kg/m  BP Readings from Last 3 Encounters:  09/22/16 122/82  06/30/16 134/78  05/01/16 112/72    Wt Readings from Last 3 Encounters:  09/22/16 176 lb 1.9 oz (79.9 kg)  06/30/16 180 lb (81.6 kg)  05/01/16 180 lb 3.2 oz (81.7 kg)    Physical Exam  Constitutional: He is oriented to person, place, and time. No distress.  HENT:  Mouth/Throat: Oropharynx is clear and moist. No oropharyngeal exudate.  Eyes: Conjunctivae are normal. Right eye exhibits no discharge. Left eye exhibits no discharge. No scleral icterus.  Neck: Normal  range of motion. Neck supple. No JVD present. No tracheal deviation present. No thyromegaly present.  Cardiovascular: Normal rate, regular rhythm, normal heart sounds and intact distal pulses.  Exam reveals no gallop and no friction rub.   No murmur heard. 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   ABNORMAL - subtle changes noted in the inf leads are new   Pulmonary/Chest: Effort normal and breath sounds normal. No stridor. No respiratory distress. He has no wheezes. He has no rales. He exhibits no tenderness.  Abdominal: Soft. Bowel sounds are normal. He exhibits no distension and no mass. There is no tenderness. There is no rebound and no guarding.  Musculoskeletal: Normal range of motion. He exhibits no edema, tenderness or deformity.  Lymphadenopathy:    He has no cervical adenopathy.  Neurological: He is oriented to person, place, and time.  Skin: Skin is warm and dry. No rash noted. He is not diaphoretic. No erythema. No pallor.  Vitals reviewed.   Lab Results  Component Value Date   WBC 7.2 09/22/2016   HGB 14.8 09/22/2016   HCT 44.0 09/22/2016   PLT 227.0 09/22/2016   GLUCOSE 96 09/22/2016   CHOL 130 09/22/2016   TRIG 99.0 09/22/2016   HDL 43.90 09/22/2016   LDLCALC 66 09/22/2016   ALT 17 09/22/2016   AST 19 09/22/2016   NA 138 09/22/2016   K 4.7 09/22/2016   CL 102 09/22/2016   CREATININE 1.30 09/22/2016   BUN 16 09/22/2016   CO2 29 09/22/2016   TSH 0.70 09/22/2016   PSA 1.93 01/18/2014    Dg Chest 2 View  Result Date: 02/06/2016 CLINICAL DATA:  Chills and fatigue after pneumonia shot.  Fever. EXAM: CHEST  2 VIEW COMPARISON:  01/07/2007 FINDINGS: Surgical plate in the lower cervical spine. Both lungs are clear. Heart and mediastinum are within normal limits. No pleural effusions. No acute bone abnormality. IMPRESSION: No active cardiopulmonary disease. Electronically Signed   By: Markus Daft M.D.   On: 02/06/2016 22:46     Assessment & Plan:   [name] was seen today for anemia, hypertension and hyperlipidemia.  Diagnoses and all orders for this visit:  Coronary artery disease involving native coronary artery of native heart without angina pectoris- His recent episode of chest heaviness and fatigue is mildly suspicious for ischemia and he has subtle changes on his EKG which may or may not be significant. He will see his cardiologist in the next week for further evaluation of this. -     Lipid panel; Future  Essential hypertension- his blood pressure is adequately well controlled, electrolytes and renal function are normal. -     Comprehensive metabolic panel; Future -     EKG 12-Lead  Atypical chest pain- as above -     EKG 12-Lead  Deficiency anemia- the anemia has resolved and his vitamin levels are normal -  IBC panel; Future -     CBC with Differential/Platelet; Future -     Vitamin B12; Future -     Folate; Future -     Ferritin; Future  Hyperlipidemia with target LDL less than 70- he has achieved his LDL goal is doing well on the statin. -     TSH; Future  Need for prophylactic vaccination with tetanus toxoid alone -     Tdap vaccine greater than or equal to 7yo IM   I have discontinued Mr. Hanko multivitamin and Coenzyme Q10. I am also having him maintain his aspirin, fish oil-omega-3 fatty acids, nitroGLYCERIN, sildenafil, atorvastatin, hydrochlorothiazide, lisinopril, verapamil, atenolol, potassium chloride, pantoprazole, and primidone. We will stop administering sodium chloride.  No orders of the defined types were placed in this encounter.    Follow-up: Return in about 4 months (around 01/20/2017).  Scarlette Calico, MD

## 2016-09-22 NOTE — Progress Notes (Signed)
Pre visit review using our clinic review tool, if applicable. No additional management support is needed unless otherwise documented below in the visit note. 

## 2016-09-22 NOTE — Patient Instructions (Signed)
Anemia, Nonspecific Anemia is a condition in which the concentration of red blood cells or hemoglobin in the blood is below normal. Hemoglobin is a substance in red blood cells that carries oxygen to the tissues of the body. Anemia results in not enough oxygen reaching these tissues. What are the causes? Common causes of anemia include:  Excessive bleeding. Bleeding may be internal or external. This includes excessive bleeding from periods (in women) or from the intestine.  Poor nutrition.  Chronic kidney, thyroid, and liver disease.  Bone marrow disorders that decrease red blood cell production.  Cancer and treatments for cancer.  HIV, AIDS, and their treatments.  Spleen problems that increase red blood cell destruction.  Blood disorders.  Excess destruction of red blood cells due to infection, medicines, and autoimmune disorders. What are the signs or symptoms?  Minor weakness.  Dizziness.  Headache.  Palpitations.  Shortness of breath, especially with exercise.  Paleness.  Cold sensitivity.  Indigestion.  Nausea.  Difficulty sleeping.  Difficulty concentrating. Symptoms may occur suddenly or they may develop slowly. How is this diagnosed? Additional blood tests are often needed. These help your health care provider determine the best treatment. Your health care provider will check your stool for blood and look for other causes of blood loss. How is this treated? Treatment varies depending on the cause of the anemia. Treatment can include:  Supplements of iron, vitamin B12, or folic acid.  Hormone medicines.  A blood transfusion. This may be needed if blood loss is severe.  Hospitalization. This may be needed if there is significant continual blood loss.  Dietary changes.  Spleen removal. Follow these instructions at home: Keep all follow-up appointments. It often takes many weeks to correct anemia, and having your health care provider check on your  condition and your response to treatment is very important. Get help right away if:  You develop extreme weakness, shortness of breath, or chest pain.  You become dizzy or have trouble concentrating.  You develop heavy vaginal bleeding.  You develop a rash.  You have bloody or black, tarry stools.  You faint.  You vomit up blood.  You vomit repeatedly.  You have abdominal pain.  You have a fever or persistent symptoms for more than 2-3 days.  You have a fever and your symptoms suddenly get worse.  You are dehydrated. This information is not intended to replace advice given to you by your health care provider. Make sure you discuss any questions you have with your health care provider. Document Released: 09/18/2004 Document Revised: 01/23/2016 Document Reviewed: 02/04/2013 Elsevier Interactive Patient Education  2017 [employer] Inc[employer]  

## 2016-09-26 NOTE — Progress Notes (Signed)
HPI The patient presents for follow up of his CAD. I Since I last saw him he has done relatively well.  In Dec he had increased stress particularly with the holidays and with a relative.  He noticed his BP increased at that time.  He was noticing readings of 170s over 80s or 90s. However, over time as distressed when awake his blood pressures are coming back down. He is exercising routinely. The patient denies any new symptoms such as chest discomfort, neck or arm discomfort. There has been no new shortness of breath, PND or orthopnea. There have been no reported palpitations, presyncope or syncope.  No Known Allergies  Current Outpatient Prescriptions  Medication Sig Dispense Refill  . aspirin 81 MG EC tablet Take 81 mg by mouth daily.      Marland Kitchen atenolol (TENORMIN) 50 MG tablet TAKE 1 TABLET BY MOUTH TWICE DAILY 200 tablet 3  . atorvastatin (LIPITOR) 20 MG tablet Take 1 tablet (20 mg total) by mouth daily. 100 tablet 3  . fish oil-omega-3 fatty acids 1000 MG capsule Take 2 g by mouth daily.    . hydrochlorothiazide (HYDRODIURIL) 25 MG tablet TAKE 1 TABLET BY MOUTH Monday, Wednesday, and Friday 100 tablet 3  . lisinopril (PRINIVIL,ZESTRIL) 20 MG tablet TAKE 1 TABLET BY MOUTH DAILY 100 tablet 3  . nitroGLYCERIN (NITROSTAT) 0.4 MG SL tablet Place 1 tablet (0.4 mg total) under the tongue every 5 (five) minutes as needed. 25 tablet 1  . pantoprazole (PROTONIX) 40 MG tablet TAKE 1 TABLET BY MOUTH EVERY DAY 90 tablet 2  . potassium chloride (K-DUR) 10 MEQ tablet Take 1 tablet (10 mEq total) by mouth every Monday, Wednesday, and Friday. (Patient taking differently: Take 20 mEq by mouth every Monday, Wednesday, and Friday. ) 100 tablet 3  . primidone (MYSOLINE) 50 MG tablet Take 1 tablet (50 mg total) by mouth at bedtime. 30 tablet 3  . sildenafil (VIAGRA) 100 MG tablet Take 1 tablet (100 mg total) by mouth as needed for erectile dysfunction. 6 tablet 11  . verapamil (CALAN-SR) 180 MG CR tablet TAKE 1  TABLET BY MOUTH EVERY NIGHT AT BEDTIME 100 tablet 3   No current facility-administered medications for this visit.     Past Medical History:  Diagnosis Date  . Anemia   . Barrett's esophagus   . Blood transfusion without reported diagnosis   . CAD (coronary artery disease)    2002 stenting of the RCA,, 2006 Stenting of diag with DES, PTCA of Circ, 50% LAD  . Diverticulosis   . DJD (degenerative joint disease)   . Duodenal ulcer   . ED (erectile dysfunction)   . GERD (gastroesophageal reflux disease)   . Hemorrhoids   . Hyperlipidemia   . Hypertension   . Myocardial infarction     Past Surgical History:  Procedure Laterality Date  . COLONOSCOPY    . ELBOW ARTHROSCOPY    . SPINAL FUSION    . SPINAL FUSION    . SPINE SURGERY     cervical  . UPPER GASTROINTESTINAL ENDOSCOPY      ROS   Mild resting tremor.  Otherwise as stated in the HPI and negative for all other systems.  PHYSICAL EXAM BP 123/73   Pulse (!) 55   Ht 5\' 6"  (1.676 m)   Wt 177 lb (80.3 kg)   SpO2 93%   BMI 28.57 kg/m  GENERAL:  Well appearing NECK:  No jugular venous distention, waveform within normal limits, carotid  upstroke brisk and symmetric, no bruits, no thyromegaly LUNGS:  Clear to auscultation bilaterally CHEST:  Unremarkable HEART:  PMI not displaced or sustained,S1 and S2 within normal limits, no S3, no S4, no clicks, no rubs, no murmurs ABD:  Flat, positive bowel sounds normal in frequency in pitch, no bruits, no rebound, no guarding, no midline pulsatile mass, no hepatomegaly, no splenomegaly EXT:  2 plus pulses throughout, no edema, no cyanosis no clubbing NEURO:  Mild left hand resting tremor.   Lab Results  Component Value Date   CHOL 130 09/22/2016   TRIG 99.0 09/22/2016   HDL 43.90 09/22/2016   LDLCALC 66 09/22/2016   Lab Results  Component Value Date   CREATININE 1.30 09/22/2016    EKG:  Sinus bradycardia, rate 51 , axis within normal limits, intervals WNL, no acute ST T  wave changes.  09/22/16  ASSESSMENT AND PLAN  CORONARY ARTERY DISEASE, S/P PTCA -  The patient has   no new sypmtoms since stress testing in 2013.  No further cardiovascular testing is indicated.  We will continue with aggressive risk reduction and meds as listed.    HYPERLIPIDEMIA -  His LDL was 66 in Jan.   I will continue him on the same therapy.   HYPERTENSION -  The blood pressure is at target. No change in medications is indicated. We will continue with therapeutic lifestyle changes (TLC).   I suspect stress is contributing to his blood pressure issues. No change in therapy is indicated. We talked about stress reduction.  CKD:   His creat was stable as above.

## 2016-09-30 ENCOUNTER — Encounter: Payer: Self-pay | Admitting: Cardiology

## 2016-09-30 ENCOUNTER — Ambulatory Visit (INDEPENDENT_AMBULATORY_CARE_PROVIDER_SITE_OTHER): Payer: PPO | Admitting: Cardiology

## 2016-09-30 VITALS — BP 123/73 | HR 55 | Ht 66.0 in | Wt 177.0 lb

## 2016-09-30 DIAGNOSIS — I2510 Atherosclerotic heart disease of native coronary artery without angina pectoris: Secondary | ICD-10-CM | POA: Diagnosis not present

## 2016-09-30 DIAGNOSIS — I10 Essential (primary) hypertension: Secondary | ICD-10-CM | POA: Diagnosis not present

## 2016-09-30 NOTE — Patient Instructions (Signed)
Medication Instructions:  Continue current medcaitions  Labwork: None Ordered  Testing/Procedures: None Ordered  Follow-Up: Your physician wants you to follow-up in: 1 Year. You will receive a reminder letter in the mail two months in advance. If you don't receive a letter, please call our office to schedule the follow-up appointment.   Any Other Special Instructions Will Be Listed Below (If Applicable).   If you need a refill on your cardiac medications before your next appointment, please call your pharmacy.

## 2016-10-01 ENCOUNTER — Encounter: Payer: Self-pay | Admitting: Cardiology

## 2016-10-02 NOTE — Progress Notes (Signed)
Subjective:   [name] was seen in consultation in the movement disorder clinic at the request of Steven Calico, MD.  The evaluation is for tremor.  Tremor started approximately 25 year ago but has been worse over the last year.  It involves the the L and R hand but the L is worse than the R.  He is R hand dominant.  Tremor is most noticeable when he picks up a cup of coffee and he will spill it all over.   There is no known family hx of tremor.    Affected by caffeine:  Thinks it may (1 cup coffee/day and starting to drink decaf) Affected by alcohol:  Doesn't drink EtOH Affected by stress:  Yes.   Affected by fatigue:  No. Spills soup if on spoon:  No. but he eats with R hand and L is worse Spills glass of liquid if full:  Yes.   Affects ADL's (tying shoes, brushing teeth, etc):  No., but may affect buttoning of cuff buttons  Current/Previously tried tremor medications: none (on atenolol x 25 years but for BP)  Current medications that may exacerbate tremor:  n/a  Neuroimaging of brain has never been performed.  10/06/16 update:  Patient follows up today.  Last visit, I started the patient on primidone for his tremor.  He states that he is doing better.  No SE with the medication.  Not spilling foods.  No new med problems.   No Known Allergies  Outpatient Encounter Prescriptions as of 10/06/2016  Medication Sig  . aspirin 81 MG EC tablet Take 81 mg by mouth daily.    Marland Kitchen atenolol (TENORMIN) 50 MG tablet TAKE 1 TABLET BY MOUTH TWICE DAILY  . atorvastatin (LIPITOR) 20 MG tablet Take 1 tablet (20 mg total) by mouth daily.  . fish oil-omega-3 fatty acids 1000 MG capsule Take 2 g by mouth daily.  . hydrochlorothiazide (HYDRODIURIL) 25 MG tablet TAKE 1 TABLET BY MOUTH Monday, Wednesday, and Friday  . lisinopril (PRINIVIL,ZESTRIL) 20 MG tablet TAKE 1 TABLET BY MOUTH DAILY  . nitroGLYCERIN (NITROSTAT) 0.4 MG SL tablet Place 1 tablet (0.4 mg total) under the tongue every 5 (five) minutes  as needed.  . pantoprazole (PROTONIX) 40 MG tablet TAKE 1 TABLET BY MOUTH EVERY DAY  . potassium chloride (K-DUR) 10 MEQ tablet Take 1 tablet (10 mEq total) by mouth every Monday, Wednesday, and Friday. (Patient taking differently: Take 20 mEq by mouth every Monday, Wednesday, and Friday. )  . primidone (MYSOLINE) 50 MG tablet Take 1 tablet (50 mg total) by mouth at bedtime.  . sildenafil (VIAGRA) 100 MG tablet Take 1 tablet (100 mg total) by mouth as needed for erectile dysfunction.  . verapamil (CALAN-SR) 180 MG CR tablet TAKE 1 TABLET BY MOUTH EVERY NIGHT AT BEDTIME   No facility-administered encounter medications on file as of 10/06/2016.     Past Medical History:  Diagnosis Date  . Anemia   . Barrett's esophagus   . Blood transfusion without reported diagnosis   . CAD (coronary artery disease)    2002 stenting of the RCA,, 2006 Stenting of diag with DES, PTCA of Circ, 50% LAD  . Diverticulosis   . DJD (degenerative joint disease)   . Duodenal ulcer   . ED (erectile dysfunction)   . GERD (gastroesophageal reflux disease)   . Hemorrhoids   . Hyperlipidemia   . Hypertension   . Myocardial infarction     Past Surgical History:  Procedure Laterality  Date  . COLONOSCOPY    . ELBOW ARTHROSCOPY    . SPINAL FUSION    . SPINAL FUSION    . SPINE SURGERY     cervical  . UPPER GASTROINTESTINAL ENDOSCOPY      Social History   Social History  . Marital status: Married    Spouse name: N/A  . Number of children: N/A  . Years of education: N/A   Occupational History  . 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 Smoking status: Former Smoker    Quit date: 08/25/1961  . Smokeless tobacco: Never Used  . Alcohol use No     Comment: rarely  . Drug use: No  . Sexual activity: Not on file   Other Topics Concern  . Not on file   Social History Narrative  . No narrative on file    Family Status  Relation Status  .    .    .    . Mother Deceased  . Father  Deceased  . Daughter Alive  . Son Alive  . Daughter Alive  . Neg Hx     Review of Systems A complete 10 system ROS was obtained and was negative apart from what is mentioned.   Objective:   VITALS:   There were no vitals filed for this visit. Gen:  Appears stated age and in NAD. HEENT:  Normocephalic, atraumatic. The mucous membranes are moist. The superficial temporal arteries are without ropiness or tenderness. Cardiovascular:  Bradycardic with regular rhythm. Lungs: Clear to auscultation bilaterally. Neck: There are no carotid bruits noted bilaterally.  NEUROLOGICAL:  Orientation:  The patient is alert and oriented x 3.  Recent and remote memory are intact.  Attention span and concentration are normal.  Able to name objects and repeat without trouble.  Fund of knowledge is appropriate Cranial nerves: There is good facial symmetry. The pupils are equal round and reactive to light bilaterally. Fundoscopic exam reveals clear disc margins bilaterally. Extraocular muscles are intact and visual fields are full to confrontational testing. Speech is fluent and clear. Soft palate rises symmetrically and there is no tongue deviation. Hearing is intact to conversational tone. Tone: Tone is good throughout.  No rigidity Sensation: Sensation is intact to light touch and pinprick throughout (facial, trunk, extremities). Vibration is absent at the bilateral big toe but intact at the bilateral ankle. There is no extinction with double simultaneous stimulation. There is no sensory dermatomal level identified. Coordination:  The patient has no dysdiadichokinesia or dysmetria. Motor: Strength is 5/5 in the bilateral upper and lower extremities.  Shoulder shrug is equal bilaterally.  There is no pronator drift.  There are no fasciculations noted. DTR's: Deep tendon reflexes are 2-/4 at the bilateral biceps, triceps, brachioradialis, patella and trace at the bilateral achilles.  Plantar responses are  downgoing bilaterally. Gait and Station: The patient is able to ambulate without difficulty. He does have difficulty ambulating in a tandem fashion.  He is able to walk on his toes.  He has minimal difficulty walking on his heels.  He is able to stand in the Romberg position with eyes open and closed.  MOVEMENT EXAM: Tremor:  There is tremor in the UE, noted most significantly with action.  The left is worse than the right.  He does have a resting component to the tremor that is intermittent on the left.  He has trouble with Archimedes spirals, worse on the left.  It is somewhat better than last visit.     Labs:  Lab Results  Component Value Date   TSH 0.70 09/22/2016     Chemistry      Component Value Date/Time   NA 138 09/22/2016 1153   K 4.7 09/22/2016 1153   CL 102 09/22/2016 1153   CO2 29 09/22/2016 1153   BUN 16 09/22/2016 1153   CREATININE 1.30 09/22/2016 1153      Component Value Date/Time   CALCIUM 9.8 09/22/2016 1153   ALKPHOS 68 09/22/2016 1153   AST 19 09/22/2016 1153   ALT 17 09/22/2016 1153   BILITOT 0.4 09/22/2016 1153     Lab Results  Component Value Date   VITAMINB12 332 09/22/2016     Assessment/Plan:   1.  Essential Tremor.  -increase primidone, 50 mg bid.  Risks, benefits, side effects and alternative therapies were discussed.  The opportunity to ask questions was given and they were answered to the best of my ability.  The patient expressed understanding and willingness to follow the outlined treatment protocols.  2.  Follow up is anticipated in the next few months, sooner should new neurologic issues arise.  CC:  Steven Calico, MD

## 2016-10-06 ENCOUNTER — Encounter: Payer: Self-pay | Admitting: Neurology

## 2016-10-06 ENCOUNTER — Ambulatory Visit (INDEPENDENT_AMBULATORY_CARE_PROVIDER_SITE_OTHER): Payer: PPO | Admitting: Neurology

## 2016-10-06 VITALS — BP 110/80 | HR 60 | Ht 66.0 in | Wt 180.0 lb

## 2016-10-06 DIAGNOSIS — G250 Essential tremor: Secondary | ICD-10-CM | POA: Diagnosis not present

## 2016-10-06 MED ORDER — PRIMIDONE 50 MG PO TABS: 50.0000 mg | ORAL_TABLET | Freq: Two times a day (BID) | ORAL | 1 refills | Status: DC

## 2016-10-06 NOTE — Patient Instructions (Signed)
Increase your primidone to 50 mg twice per day

## 2016-10-20 DIAGNOSIS — M25571 Pain in right ankle and joints of right foot: Secondary | ICD-10-CM | POA: Diagnosis not present

## 2016-10-20 DIAGNOSIS — M1711 Unilateral primary osteoarthritis, right knee: Secondary | ICD-10-CM | POA: Diagnosis not present

## 2016-10-27 DIAGNOSIS — M1711 Unilateral primary osteoarthritis, right knee: Secondary | ICD-10-CM | POA: Diagnosis not present

## 2016-11-03 DIAGNOSIS — M1711 Unilateral primary osteoarthritis, right knee: Secondary | ICD-10-CM | POA: Diagnosis not present

## 2016-12-17 ENCOUNTER — Other Ambulatory Visit: Payer: Self-pay | Admitting: Dermatology

## 2016-12-17 DIAGNOSIS — D044 Carcinoma in situ of skin of scalp and neck: Secondary | ICD-10-CM | POA: Diagnosis not present

## 2016-12-17 DIAGNOSIS — L570 Actinic keratosis: Secondary | ICD-10-CM | POA: Diagnosis not present

## 2017-02-11 DIAGNOSIS — N5201 Erectile dysfunction due to arterial insufficiency: Secondary | ICD-10-CM | POA: Diagnosis not present

## 2017-02-11 DIAGNOSIS — N401 Enlarged prostate with lower urinary tract symptoms: Secondary | ICD-10-CM | POA: Diagnosis not present

## 2017-02-11 DIAGNOSIS — R3121 Asymptomatic microscopic hematuria: Secondary | ICD-10-CM | POA: Diagnosis not present

## 2017-02-11 DIAGNOSIS — R351 Nocturia: Secondary | ICD-10-CM | POA: Diagnosis not present

## 2017-03-03 ENCOUNTER — Ambulatory Visit (INDEPENDENT_AMBULATORY_CARE_PROVIDER_SITE_OTHER): Payer: PPO | Admitting: Internal Medicine

## 2017-03-03 ENCOUNTER — Encounter: Payer: Self-pay | Admitting: Internal Medicine

## 2017-03-03 ENCOUNTER — Other Ambulatory Visit (INDEPENDENT_AMBULATORY_CARE_PROVIDER_SITE_OTHER): Payer: PPO

## 2017-03-03 VITALS — BP 130/70 | HR 91 | Temp 98.1°F | Resp 16 | Ht 66.0 in | Wt 178.2 lb

## 2017-03-03 DIAGNOSIS — I10 Essential (primary) hypertension: Secondary | ICD-10-CM

## 2017-03-03 DIAGNOSIS — N183 Chronic kidney disease, stage 3 unspecified: Secondary | ICD-10-CM

## 2017-03-03 DIAGNOSIS — Z0000 Encounter for general adult medical examination without abnormal findings: Secondary | ICD-10-CM | POA: Diagnosis not present

## 2017-03-03 LAB — BASIC METABOLIC PANEL
BUN: 20 mg/dL (ref 6–23)
CHLORIDE: 102 meq/L (ref 96–112)
CO2: 29 mEq/L (ref 19–32)
Calcium: 10.5 mg/dL (ref 8.4–10.5)
Creatinine, Ser: 1.32 mg/dL (ref 0.40–1.50)
GFR: 55.68 mL/min — ABNORMAL LOW (ref >60.00)
GLUCOSE: 96 mg/dL (ref 70–99)
POTASSIUM: 4.8 meq/L (ref 3.5–5.1)
SODIUM: 140 meq/L (ref 135–145)

## 2017-03-03 NOTE — Progress Notes (Signed)
Subjective:  Patient ID: [name], male    DOB: [birth year]  Age: 76 y.o. MRN: [id number]  CC: Annual Exam and Hypertension   HPI HOLLISTER WESSLER presents for a CPX.  His BP has been well controlled. He has had no recent episodes of CP, DOE, SOB, palpitations, dizziness, or lightheadedness.  Past Medical History:  Diagnosis Date  . Anemia   . Barrett's esophagus   . Blood transfusion without reported diagnosis   . CAD (coronary artery disease)    2002 stenting of the RCA,, 2006 Stenting of diag with DES, PTCA of Circ, 50% LAD  . Diverticulosis   . DJD (degenerative joint disease)   . Duodenal ulcer   . ED (erectile dysfunction)   . GERD (gastroesophageal reflux disease)   . Hemorrhoids   . Hyperlipidemia   . Hypertension   . Myocardial infarction [facility])    Past Surgical History:  Procedure Laterality Date  . COLONOSCOPY    . ELBOW ARTHROSCOPY    . SPINAL FUSION    . SPINAL FUSION    . SPINE SURGERY     cervical  . UPPER GASTROINTESTINAL ENDOSCOPY      reports that he quit smoking about 55 years ago. He has never used smokeless tobacco. He reports that he does not drink alcohol or use drugs. family history includes Alcohol abuse in his unknown relative; Alzheimer's disease in his unknown relative; Coronary artery disease in his unknown relative; Dementia in his mother. No Known Allergies  Outpatient Medications Prior to Visit  Medication Sig Dispense Refill  . aspirin 81 MG EC tablet Take 81 mg by mouth daily.      . fish oil-omega-3 fatty acids 1000 MG capsule Take 2 g by mouth daily.    . nitroGLYCERIN (NITROSTAT) 0.4 MG SL tablet Place 1 tablet (0.4 mg total) under the tongue every 5 (five) minutes as needed. 25 tablet 1  . primidone (MYSOLINE) 50 MG tablet Take 1 tablet (50 mg total) by mouth 2 (two) times daily. 180 tablet 1  . sildenafil (VIAGRA) 100 MG tablet Take 1 tablet (100 mg total) by mouth as needed for erectile dysfunction. 6 tablet 11  .  atenolol (TENORMIN) 50 MG tablet TAKE 1 TABLET BY MOUTH TWICE DAILY 200 tablet 3  . atorvastatin (LIPITOR) 20 MG tablet Take 1 tablet (20 mg total) by mouth daily. 100 tablet 3  . hydrochlorothiazide (HYDRODIURIL) 25 MG tablet TAKE 1 TABLET BY MOUTH Monday, Wednesday, and Friday 100 tablet 3  . lisinopril (PRINIVIL,ZESTRIL) 20 MG tablet TAKE 1 TABLET BY MOUTH DAILY 100 tablet 3  . pantoprazole (PROTONIX) 40 MG tablet TAKE 1 TABLET BY MOUTH EVERY DAY 90 tablet 2  . potassium chloride (K-DUR) 10 MEQ tablet Take 1 tablet (10 mEq total) by mouth every Monday, Wednesday, and Friday. (Patient taking differently: Take 20 mEq by mouth every Monday, Wednesday, and Friday. ) 100 tablet 3  . verapamil (CALAN-SR) 180 MG CR tablet TAKE 1 TABLET BY MOUTH EVERY NIGHT AT BEDTIME 100 tablet 3   No facility-administered medications prior to visit.     ROS Review of Systems  Constitutional: Negative.  Negative for diaphoresis, fatigue and unexpected weight change.  HENT: Negative.   Eyes: Negative.  Negative for visual disturbance.  Respiratory: Negative for cough, chest tightness, shortness of breath and wheezing.   Cardiovascular: Negative for chest pain, palpitations and leg swelling.  Gastrointestinal: Negative for abdominal pain, blood in stool, constipation, diarrhea, nausea and vomiting.  Endocrine: Negative.   Genitourinary: Negative.  Negative for scrotal swelling and urgency.  Musculoskeletal: Negative.  Negative for back pain and myalgias.  Skin: Negative.  Negative for color change and rash.  Allergic/Immunologic: Negative.   Neurological: Negative.  Negative for dizziness, weakness, numbness and headaches.  Hematological: Negative for adenopathy. Does not bruise/bleed easily.  Psychiatric/Behavioral: Negative.     Objective:  BP 130/70 (BP Location: Left Arm, Patient Position: Sitting, Cuff Size: Normal)   Pulse 91   Temp 98.1 F (36.7 C) (Oral)   Resp 16   Ht 5\' 6"  (1.676 m)   Wt 178  lb 4 oz (80.9 kg)   SpO2 99%   BMI 28.77 kg/m   BP Readings from Last 3 Encounters:  03/03/17 130/70  10/06/16 110/80  09/30/16 123/73    Wt Readings from Last 3 Encounters:  03/03/17 178 lb 4 oz (80.9 kg)  10/06/16 180 lb (81.6 kg)  09/30/16 177 lb (80.3 kg)    Physical Exam  Constitutional: He is oriented to person, place, and time. No distress.  HENT:  Mouth/Throat: Oropharynx is clear and moist. No oropharyngeal exudate.  Eyes: Conjunctivae are normal. Right eye exhibits no discharge. Left eye exhibits no discharge. No scleral icterus.  Neck: Normal range of motion. Neck supple. No JVD present. No thyromegaly present.  Cardiovascular: Normal rate, regular rhythm and intact distal pulses.  Exam reveals no gallop and no friction rub.   No murmur heard. EKG---  Sinus  Bradycardia  WITHIN NORMAL LIMITS - no change from prior EKG  Pulmonary/Chest: Effort normal and breath sounds normal. No respiratory distress. He has no wheezes. He has no rales. He exhibits no tenderness.  Abdominal: Soft. Bowel sounds are normal. He exhibits no distension and no mass. There is no tenderness. There is no rebound and no guarding.  Musculoskeletal: Normal range of motion. He exhibits no edema, tenderness or deformity.  Lymphadenopathy:    He has no cervical adenopathy.  Neurological: He is alert and oriented to person, place, and time.  Skin: Skin is warm and dry. No rash noted. He is not diaphoretic. No erythema. No pallor.  Psychiatric: He has a normal mood and affect. His behavior is normal. Judgment and thought content normal.  Vitals reviewed.   Lab Results  Component Value Date   WBC 7.2 09/22/2016   HGB 14.8 09/22/2016   HCT 44.0 09/22/2016   PLT 227.0 09/22/2016   GLUCOSE 96 03/03/2017   CHOL 130 09/22/2016   TRIG 99.0 09/22/2016   HDL 43.90 09/22/2016   LDLCALC 66 09/22/2016   ALT 17 09/22/2016   AST 19 09/22/2016   NA 140 03/03/2017   K 4.8 03/03/2017   CL 102 03/03/2017    CREATININE 1.32 03/03/2017   BUN 20 03/03/2017   CO2 29 03/03/2017   TSH 0.70 09/22/2016   PSA 1.93 01/18/2014    Dg Chest 2 View  Result Date: 02/06/2016 CLINICAL DATA:  Chills and fatigue after pneumonia shot.  Fever. EXAM: CHEST  2 VIEW COMPARISON:  01/07/2007 FINDINGS: Surgical plate in the lower cervical spine. Both lungs are clear. Heart and mediastinum are within normal limits. No pleural effusions. No acute bone abnormality. IMPRESSION: No active cardiopulmonary disease. Electronically Signed   By: Markus Daft M.D.   On: 02/06/2016 22:46    Assessment & Plan:   Abdulmalik was seen today for annual exam and hypertension.  Diagnoses and all orders for this visit:  Essential hypertension- His blood pressure is adequately  well controlled, on his EKG he has mild bradycardia but he is asymptomatic and is not willing to stop taking the beta blocker or calcium channel blocker, he prefers to discuss this with his cardiologist and an upcoming visit, electrolytes and renal function are normal. Will continue the current combination of beta blocker, calcium channel blocker, ACE inhibitor, and thiazide diuretic. -     Basic metabolic panel; Future -     EKG 12-Lead  CKD (chronic kidney disease), stage III- his renal function has improved, he will continue to avoid nephrotoxic agents. Will continue to obtain good blood pressure control. -     Basic metabolic panel; Future   I am having Mr. Carlyon maintain his aspirin, fish oil-omega-3 fatty acids, nitroGLYCERIN, sildenafil, and primidone.  No orders of the defined types were placed in this encounter.  See AVS for instructions about healthy living and anticipatory guidance.   Follow-up: Return in about 6 months (around 09/03/2017).  Scarlette Calico, MD

## 2017-03-03 NOTE — Patient Instructions (Signed)

## 2017-03-05 ENCOUNTER — Telehealth: Payer: Self-pay | Admitting: Internal Medicine

## 2017-03-05 DIAGNOSIS — I2510 Atherosclerotic heart disease of native coronary artery without angina pectoris: Secondary | ICD-10-CM

## 2017-03-05 DIAGNOSIS — E785 Hyperlipidemia, unspecified: Secondary | ICD-10-CM

## 2017-03-05 DIAGNOSIS — I10 Essential (primary) hypertension: Secondary | ICD-10-CM

## 2017-03-05 MED ORDER — HYDROCHLOROTHIAZIDE 25 MG PO TABS: 25.0000 mg | ORAL_TABLET | ORAL | 3 refills | Status: DC

## 2017-03-05 MED ORDER — ATORVASTATIN CALCIUM 20 MG PO TABS: 20.0000 mg | ORAL_TABLET | Freq: Every day | ORAL | 3 refills | Status: DC

## 2017-03-05 MED ORDER — ATENOLOL 50 MG PO TABS: 50.0000 mg | ORAL_TABLET | Freq: Two times a day (BID) | ORAL | 3 refills | Status: DC

## 2017-03-05 MED ORDER — VERAPAMIL HCL ER 180 MG PO TBCR: 180.0000 mg | EXTENDED_RELEASE_TABLET | Freq: Every day | ORAL | 3 refills | Status: DC

## 2017-03-05 MED ORDER — POTASSIUM CHLORIDE ER 10 MEQ PO TBCR: 10.0000 meq | EXTENDED_RELEASE_TABLET | ORAL | 3 refills | Status: DC

## 2017-03-05 MED ORDER — LISINOPRIL 20 MG PO TABS: 20.0000 mg | ORAL_TABLET | Freq: Every day | ORAL | 3 refills | Status: DC

## 2017-03-05 MED ORDER — PANTOPRAZOLE SODIUM 40 MG PO TBEC: 40.0000 mg | DELAYED_RELEASE_TABLET | Freq: Every day | ORAL | 3 refills | Status: DC

## 2017-03-05 NOTE — Telephone Encounter (Signed)
Pt had a CPE on Tuesday and states his prescriptions were supposed to be called in and renewed.  Please call in  [facility] on [address]

## 2017-03-05 NOTE — Telephone Encounter (Signed)
Prescriptions sent in

## 2017-03-06 NOTE — Assessment & Plan Note (Signed)

## 2017-04-06 NOTE — Progress Notes (Signed)
Subjective:   [name] was seen in consultation in the movement disorder clinic at the request of [name], MD.  The evaluation is for tremor.  Tremor started approximately 25 year ago but has been worse over the last year.  It involves the the L and R hand but the L is worse than the R.  He is R hand dominant.  Tremor is most noticeable when he picks up a cup of coffee and he will spill it all over.   There is no known family hx of tremor.    Affected by caffeine:  Thinks it may (1 cup coffee/day and starting to drink decaf) Affected by alcohol:  Doesn't drink EtOH Affected by stress:  Yes.   Affected by fatigue:  No. Spills soup if on spoon:  No. but he eats with R hand and L is worse Spills glass of liquid if full:  Yes.   Affects ADL's (tying shoes, brushing teeth, etc):  No., but may affect buttoning of cuff buttons  Current/Previously tried tremor medications: none (on atenolol x 25 years but for BP)  Current medications that may exacerbate tremor:  n/a  Neuroimaging of brain has never been performed.  10/06/16 update:  Patient follows up today.  Last visit, I started the patient on primidone for his tremor.  He states that he is doing better.  No SE with the medication.  Not spilling foods.  No new med problems.   04/07/17 update:  Pt seen in f/u for ET.  He is on primidone 50 mg bid, which was increased last visit.  States that he is doing really well and not having any side effects.  The records that were made available to me were reviewed.  Had his annual PE in July.  "I have a lot of energy."  Goes to the gym 3 days a week.    No Known Allergies  Outpatient Encounter Prescriptions as of 04/07/2017  Medication Sig  . aspirin 81 MG EC tablet Take 81 mg by mouth daily.    Marland Kitchen atenolol (TENORMIN) 50 MG tablet Take 1 tablet (50 mg total) by mouth 2 (two) times daily.  Marland Kitchen atorvastatin (LIPITOR) 20 MG tablet Take 1 tablet (20 mg total) by mouth daily.  . fish oil-omega-3  fatty acids 1000 MG capsule Take 2 g by mouth daily.  . hydrochlorothiazide (HYDRODIURIL) 25 MG tablet Take 1 tablet (25 mg total) by mouth every other day.  . lisinopril (PRINIVIL,ZESTRIL) 20 MG tablet Take 1 tablet (20 mg total) by mouth daily.  . pantoprazole (PROTONIX) 40 MG tablet Take 1 tablet (40 mg total) by mouth daily.  . potassium chloride (K-DUR) 10 MEQ tablet Take 1 tablet (10 mEq total) by mouth every Monday, Wednesday, and Friday.  . primidone (MYSOLINE) 50 MG tablet Take 1 tablet (50 mg total) by mouth 2 (two) times daily.  . sildenafil (VIAGRA) 100 MG tablet Take 1 tablet (100 mg total) by mouth as needed for erectile dysfunction.  . verapamil (CALAN-SR) 180 MG CR tablet Take 1 tablet (180 mg total) by mouth daily.  . nitroGLYCERIN (NITROSTAT) 0.4 MG SL tablet Place 1 tablet (0.4 mg total) under the tongue every 5 (five) minutes as needed. (Patient not taking: Reported on 04/07/2017)   No facility-administered encounter medications on file as of 04/07/2017.     Past Medical History:  Diagnosis Date  . Anemia   . Barrett's esophagus   . Blood transfusion without reported diagnosis   .  CAD (coronary artery disease)    2002 stenting of the RCA,, 2006 Stenting of diag with DES, PTCA of Circ, 50% LAD  . Diverticulosis   . DJD (degenerative joint disease)   . Duodenal ulcer   . ED (erectile dysfunction)   . GERD (gastroesophageal reflux disease)   . Hemorrhoids   . Hyperlipidemia   . Hypertension   . Myocardial infarction [facility])     Past Surgical History:  Procedure Laterality Date  . COLONOSCOPY    . ELBOW ARTHROSCOPY    . SPINAL FUSION    . SPINAL FUSION    . SPINE SURGERY     cervical  . UPPER GASTROINTESTINAL ENDOSCOPY      Social History   Social History  . Marital status: Married    Spouse name: N/A  . Number of children: N/A  . Years of education: N/A   Occupational History  . 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 Smoking status:  Former Smoker    Quit date: 08/25/1961  . Smokeless tobacco: Never Used  . Alcohol use No     Comment: rarely  . Drug use: No  . Sexual activity: Not on file   Other Topics Concern  . Not on file   Social History Narrative  . No narrative on file    Family Status  Relation Status  . Unknown (Not Specified)  . Unknown (Not Specified)  . Unknown (Not Specified)  . Mother Deceased  . Father Deceased  . Daughter Alive  . Son Alive  . Daughter Alive  . Neg Hx (Not Specified)    Review of Systems A complete 10 system ROS was obtained and was negative apart from what is mentioned.   Objective:   VITALS:   Vitals:   04/07/17 1255  BP: 102/62  Pulse: (!) 54  SpO2: 96%  Weight: 177 lb (80.3 kg)  Height: 5\' 6"  (1.676 m)   Gen:  Appears stated age and in NAD. HEENT:  Normocephalic, atraumatic. The mucous membranes are moist. The superficial temporal arteries are without ropiness or tenderness. Cardiovascular:  Bradycardic with regular rhythm. Lungs: Clear to auscultation bilaterally. Neck: There are no carotid bruits noted bilaterally.  NEUROLOGICAL:  Orientation:  The patient is alert and oriented x 3.   Cranial nerves: There is good facial symmetry.  Extraocular muscles are intact and visual fields are full to confrontational testing. Speech is fluent and clear. Soft palate rises symmetrically and there is no tongue deviation. Hearing is intact to conversational tone. Tone: Tone is good throughout.  No rigidity Sensation: Sensation is 5/5 x 4.   Coordination:  The patient has no dysdiadichokinesia or dysmetria. Motor: Strength is 5/5 in the bilateral upper and lower extremities.  Shoulder shrug is equal bilaterally.  There is no pronator drift.  There are no fasciculations noted. Gait and Station: The patient is able to ambulate without difficulty.   MOVEMENT EXAM: Tremor:  There is tremor in the UE, noted most significantly with action.  The left is worse than the  right.  He does have a resting component to the tremor that is intermittent on the left.  Archimedes spirals are greatly improved on the L compared to prior to starting meds.   Labs:  Lab Results  Component Value Date   TSH 0.70 09/22/2016     Chemistry      Component Value Date/Time   NA 140 03/03/2017 1507  K 4.8 03/03/2017 1507   CL 102 03/03/2017 1507   CO2 29 03/03/2017 1507   BUN 20 03/03/2017 1507   CREATININE 1.32 03/03/2017 1507      Component Value Date/Time   CALCIUM 10.5 03/03/2017 1507   ALKPHOS 68 09/22/2016 1153   AST 19 09/22/2016 1153   ALT 17 09/22/2016 1153   BILITOT 0.4 09/22/2016 1153     Lab Results  Component Value Date   VITAMINB12 332 09/22/2016     Assessment/Plan:   1.  Essential Tremor.  -continue primidone, 50 mg bid.  RF provided today. Risks, benefits, side effects and alternative therapies were discussed.  The opportunity to ask questions was given and they were answered to the best of my ability.  The patient expressed understanding and willingness to follow the outlined treatment protocols.  2.  Follow up is anticipated in the next few months, sooner should new neurologic issues arise.  Much greater than 50% of this visit was spent in counseling and coordinating care.  Total face to face time:  15 min  CC:  [name], MD

## 2017-04-07 ENCOUNTER — Encounter: Payer: Self-pay | Admitting: Neurology

## 2017-04-07 ENCOUNTER — Ambulatory Visit (INDEPENDENT_AMBULATORY_CARE_PROVIDER_SITE_OTHER): Payer: PPO | Admitting: Neurology

## 2017-04-07 VITALS — BP 102/62 | HR 54 | Ht 66.0 in | Wt 177.0 lb

## 2017-04-07 DIAGNOSIS — G250 Essential tremor: Secondary | ICD-10-CM | POA: Diagnosis not present

## 2017-04-07 MED ORDER — PRIMIDONE 50 MG PO TABS: 50.0000 mg | ORAL_TABLET | Freq: Two times a day (BID) | ORAL | 1 refills | Status: DC

## 2017-04-07 NOTE — Patient Instructions (Signed)
You are doing really well!!  Let me know if you need me before next visit!  I will see you in 6 months!

## 2017-04-14 DIAGNOSIS — H04123 Dry eye syndrome of bilateral lacrimal glands: Secondary | ICD-10-CM | POA: Diagnosis not present

## 2017-04-14 DIAGNOSIS — H02839 Dermatochalasis of unspecified eye, unspecified eyelid: Secondary | ICD-10-CM | POA: Diagnosis not present

## 2017-04-14 DIAGNOSIS — H18413 Arcus senilis, bilateral: Secondary | ICD-10-CM | POA: Diagnosis not present

## 2017-04-14 DIAGNOSIS — H353130 Nonexudative age-related macular degeneration, bilateral, stage unspecified: Secondary | ICD-10-CM | POA: Diagnosis not present

## 2017-04-14 DIAGNOSIS — H2513 Age-related nuclear cataract, bilateral: Secondary | ICD-10-CM | POA: Diagnosis not present

## 2017-05-31 ENCOUNTER — Other Ambulatory Visit: Payer: Self-pay | Admitting: Internal Medicine

## 2017-05-31 DIAGNOSIS — I10 Essential (primary) hypertension: Secondary | ICD-10-CM

## 2017-06-27 ENCOUNTER — Ambulatory Visit (INDEPENDENT_AMBULATORY_CARE_PROVIDER_SITE_OTHER): Payer: PPO

## 2017-06-27 DIAGNOSIS — Z23 Encounter for immunization: Secondary | ICD-10-CM

## 2017-08-10 ENCOUNTER — Other Ambulatory Visit: Payer: Self-pay | Admitting: Dermatology

## 2017-08-10 DIAGNOSIS — D044 Carcinoma in situ of skin of scalp and neck: Secondary | ICD-10-CM | POA: Diagnosis not present

## 2017-08-10 DIAGNOSIS — D0439 Carcinoma in situ of skin of other parts of face: Secondary | ICD-10-CM | POA: Diagnosis not present

## 2017-08-10 DIAGNOSIS — D0421 Carcinoma in situ of skin of right ear and external auricular canal: Secondary | ICD-10-CM | POA: Diagnosis not present

## 2017-08-10 DIAGNOSIS — C4492 Squamous cell carcinoma of skin, unspecified: Secondary | ICD-10-CM

## 2017-08-10 DIAGNOSIS — L570 Actinic keratosis: Secondary | ICD-10-CM | POA: Diagnosis not present

## 2017-08-10 HISTORY — DX: Squamous cell carcinoma of skin, unspecified: C44.92

## 2017-09-10 DIAGNOSIS — D0421 Carcinoma in situ of skin of right ear and external auricular canal: Secondary | ICD-10-CM | POA: Diagnosis not present

## 2017-09-10 DIAGNOSIS — D044 Carcinoma in situ of skin of scalp and neck: Secondary | ICD-10-CM | POA: Diagnosis not present

## 2017-09-23 ENCOUNTER — Encounter: Payer: Self-pay | Admitting: *Deleted

## 2017-09-23 ENCOUNTER — Telehealth: Payer: Self-pay | Admitting: Cardiology

## 2017-09-23 NOTE — Telephone Encounter (Signed)
Closed Encounter  °

## 2017-09-30 NOTE — Progress Notes (Signed)
HPI The patient presents for follow up of his CAD.  Since I last saw him has done very well.  He has much less stress.  The patient denies any new symptoms such as chest discomfort, neck or arm discomfort. There has been no new shortness of breath, PND or orthopnea. There have been no reported palpitations, presyncope or syncope.   He is exercising routinely.    No Known Allergies  Current Outpatient Medications  Medication Sig Dispense Refill  . aspirin 81 MG EC tablet Take 81 mg by mouth daily.      Marland Kitchen atenolol (TENORMIN) 50 MG tablet Take 1 tablet (50 mg total) by mouth 2 (two) times daily. 200 tablet 3  . atorvastatin (LIPITOR) 20 MG tablet Take 1 tablet (20 mg total) by mouth daily. 90 tablet 3  . fish oil-omega-3 fatty acids 1000 MG capsule Take 2 g by mouth daily.    . hydrochlorothiazide (HYDRODIURIL) 25 MG tablet Take 1 tablet (25 mg total) by mouth every other day. 45 tablet 3  . lisinopril (PRINIVIL,ZESTRIL) 20 MG tablet Take 1 tablet (20 mg total) by mouth daily. 90 tablet 3  . nitroGLYCERIN (NITROSTAT) 0.4 MG SL tablet Place 1 tablet (0.4 mg total) under the tongue every 5 (five) minutes as needed. 25 tablet 1  . pantoprazole (PROTONIX) 40 MG tablet Take 1 tablet (40 mg total) by mouth daily. 90 tablet 3  . potassium chloride (K-DUR) 10 MEQ tablet Take 1 tablet (10 mEq total) by mouth every Monday, Wednesday, and Friday. 45 tablet 3  . primidone (MYSOLINE) 50 MG tablet Take 1 tablet (50 mg total) by mouth 2 (two) times daily. 180 tablet 1  . sildenafil (VIAGRA) 100 MG tablet Take 1 tablet (100 mg total) by mouth as needed for erectile dysfunction. 6 tablet 11  . verapamil (CALAN-SR) 180 MG CR tablet Take 1 tablet (180 mg total) by mouth daily. 90 tablet 3   No current facility-administered medications for this visit.     Past Medical History:  Diagnosis Date  . Anemia   . Barrett's esophagus   . Blood transfusion without reported diagnosis   . CAD (coronary artery  disease)    2002 stenting of the RCA,, 2006 Stenting of diag with DES, PTCA of Circ, 50% LAD  . Diverticulosis   . DJD (degenerative joint disease)   . Duodenal ulcer   . ED (erectile dysfunction)   . GERD (gastroesophageal reflux disease)   . Hemorrhoids   . Hyperlipidemia   . Hypertension   . Myocardial infarction [facility])     Past Surgical History:  Procedure Laterality Date  . COLONOSCOPY    . ELBOW ARTHROSCOPY    . SPINAL FUSION    . SPINAL FUSION    . SPINE SURGERY     cervical  . UPPER GASTROINTESTINAL ENDOSCOPY      ROS   As stated in the HPI and negative for all other systems.  PHYSICAL EXAM BP 138/82   Pulse (!) 58   Ht 5\' 6"  (1.676 m)   Wt 177 lb (80.3 kg)   BMI 28.57 kg/m   GENERAL:  Well appearing NECK:  No jugular venous distention, waveform within normal limits, carotid upstroke brisk and symmetric, no bruits, no thyromegaly LUNGS:  Clear to auscultation bilaterally CHEST:  Unremarkable HEART:  PMI not displaced or sustained,S1 and S2 within normal limits, no S3, no S4, no clicks, no rubs, no murmurs ABD:  Flat, positive bowel sounds normal  in frequency in pitch, no bruits, no rebound, no guarding, no midline pulsatile mass, no hepatomegaly, no splenomegaly EXT:  2 plus pulses throughout, no edema, no cyanosis no clubbing    GENERAL:  Well appearing NECK:  No jugular venous distention, waveform within normal limits, carotid upstroke brisk and symmetric, no bruits, no thyromegaly LUNGS:  Clear to auscultation bilaterally CHEST:  Unremarkable HEART:  PMI not displaced or sustained,S1 and S2 within normal limits, no S3, no S4, no clicks, no rubs, no murmurs ABD:  Flat, positive bowel sounds normal in frequency in pitch, no bruits, no rebound, no guarding, no midline pulsatile mass, no hepatomegaly, no splenomegaly EXT:  2 plus pulses throughout, no edema, no cyanosis no clubbing NEURO:  Mild left hand resting tremor.   Lab Results  Component Value Date    CHOL 130 09/22/2016   TRIG 99.0 09/22/2016   HDL 43.90 09/22/2016   LDLCALC 66 09/22/2016   Lab Results  Component Value Date   CREATININE 1.32 03/03/2017    EKG:  NA  ASSESSMENT AND PLAN  CORONARY ARTERY DISEASE, S/P PTCA -  Since stress testing in 2013 .  No further testing is indicated.   HYPERLIPIDEMIA -  His LDL was 66 in Jan of last year.  He will continue the meds as listed.      HYPERTENSION -  The blood pressure is well controlled.  He does have low HR but he tolerates this.  I would not suggest changing the meds and he feels fine to continue these.   CKD:   His creat was stable as above last year.  No change in therapy.

## 2017-10-01 ENCOUNTER — Ambulatory Visit: Payer: PPO | Admitting: Cardiology

## 2017-10-01 ENCOUNTER — Encounter: Payer: Self-pay | Admitting: Cardiology

## 2017-10-01 VITALS — BP 138/82 | HR 58 | Ht 66.0 in | Wt 177.0 lb

## 2017-10-01 DIAGNOSIS — E785 Hyperlipidemia, unspecified: Secondary | ICD-10-CM | POA: Diagnosis not present

## 2017-10-01 DIAGNOSIS — I2510 Atherosclerotic heart disease of native coronary artery without angina pectoris: Secondary | ICD-10-CM

## 2017-10-01 NOTE — Patient Instructions (Signed)
Medication Instructions:  Continue current medications  If you need a refill on your cardiac medications before your next appointment, please call your pharmacy.  Labwork: None Ordered   Testing/Procedures: None Ordered  Follow-Up: Your physician wants you to follow-up in: 1 Year. You should receive a reminder letter in the mail two months in advance. If you do not receive a letter, please call our office [phone].    Thank you for choosing [facility]!!      

## 2017-10-02 ENCOUNTER — Other Ambulatory Visit: Payer: Self-pay | Admitting: Neurology

## 2017-10-02 MED ORDER — PRIMIDONE 50 MG PO TABS: 50.0000 mg | ORAL_TABLET | Freq: Two times a day (BID) | ORAL | 1 refills | Status: DC

## 2017-10-06 NOTE — Progress Notes (Signed)
Subjective:   [name] was seen in consultation in the movement disorder clinic at the request of [name], MD.  The evaluation is for tremor.  Tremor started approximately 25 year ago but has been worse over the last year.  It involves the the L and R hand but the L is worse than the R.  He is R hand dominant.  Tremor is most noticeable when he picks up a cup of coffee and he will spill it all over.   There is no known family hx of tremor.    Affected by caffeine:  Thinks it may (1 cup coffee/day and starting to drink decaf) Affected by alcohol:  Doesn't drink EtOH Affected by stress:  Yes.   Affected by fatigue:  No. Spills soup if on spoon:  No. but he eats with R hand and L is worse Spills glass of liquid if full:  Yes.   Affects ADL's (tying shoes, brushing teeth, etc):  No., but may affect buttoning of cuff buttons  Current/Previously tried tremor medications: none (on atenolol x 25 years but for BP)  Current medications that may exacerbate tremor:  n/a  Neuroimaging of brain has never been performed.  10/06/16 update:  Patient follows up today.  Last visit, I started the patient on primidone for his tremor.  He states that he is doing better.  No SE with the medication.  Not spilling foods.  No new med problems.   04/07/17 update:  Pt seen in f/u for ET.  He is on primidone 50 mg bid, which was increased last visit.  States that he is doing really well and not having any side effects.  The records that were made available to me were reviewed.  Had his annual PE in July.  "I have a lot of energy."  Goes to the gym 3 days a week.    10/08/17 update: Patient is seen today in follow-up for essential tremor.  He is on primidone, 50 mg twice per day.  He is very happy with efficacy.  He can now "carry a cup of coffee in one hand" and tea for his wife in the other hand and nothing spills.  "I'm thankful that I feel super."  Does tell me that tip of thumb, pointer and middle are  numb.  Sometimes, it will prevent him from buttoning clothes.  Records have been reviewed since last visit.  He saw Dr. [name] on October 01, 2017.  No changes in medications from that visit.  No Known Allergies  Outpatient Encounter Medications as of 10/08/2017  Medication Sig  . aspirin 81 MG EC tablet Take 81 mg by mouth daily.    Marland Kitchen atenolol (TENORMIN) 50 MG tablet Take 1 tablet (50 mg total) by mouth 2 (two) times daily.  Marland Kitchen atorvastatin (LIPITOR) 20 MG tablet Take 1 tablet (20 mg total) by mouth daily.  . fish oil-omega-3 fatty acids 1000 MG capsule Take 2 g by mouth daily.  . hydrochlorothiazide (HYDRODIURIL) 25 MG tablet Take 1 tablet (25 mg total) by mouth every other day.  . lisinopril (PRINIVIL,ZESTRIL) 20 MG tablet Take 1 tablet (20 mg total) by mouth daily.  . pantoprazole (PROTONIX) 40 MG tablet Take 1 tablet (40 mg total) by mouth daily.  . potassium chloride (K-DUR) 10 MEQ tablet Take 1 tablet (10 mEq total) by mouth every Monday, Wednesday, and Friday.  . primidone (MYSOLINE) 50 MG tablet Take 1 tablet (50 mg total) by mouth 2 (  two) times daily.  . sildenafil (VIAGRA) 100 MG tablet Take 1 tablet (100 mg total) by mouth as needed for erectile dysfunction.  . verapamil (CALAN-SR) 180 MG CR tablet Take 1 tablet (180 mg total) by mouth daily.  . nitroGLYCERIN (NITROSTAT) 0.4 MG SL tablet Place 1 tablet (0.4 mg total) under the tongue every 5 (five) minutes as needed. (Patient not taking: Reported on 10/08/2017)   No facility-administered encounter medications on file as of 10/08/2017.     Past Medical History:  Diagnosis Date  . Anemia   . Barrett's esophagus   . Blood transfusion without reported diagnosis   . CAD (coronary artery disease)    2002 stenting of the RCA,, 2006 Stenting of diag with DES, PTCA of Circ, 50% LAD  . Diverticulosis   . DJD (degenerative joint disease)   . Duodenal ulcer   . ED (erectile dysfunction)   . GERD (gastroesophageal reflux disease)   .  Hemorrhoids   . Hyperlipidemia   . Hypertension   . Myocardial infarction [facility])     Past Surgical History:  Procedure Laterality Date  . COLONOSCOPY    . ELBOW ARTHROSCOPY    . SPINAL FUSION    . SPINAL FUSION    . SPINE SURGERY     cervical  . UPPER GASTROINTESTINAL ENDOSCOPY      Social History   Socioeconomic History  . Marital status: Married    Spouse name: Not on file  . Number of children: Not on file  . Years of education: Not on file  . Highest education level: Not on file  Social Needs  . Financial resource strain: Not on file  . Food insecurity - worry: Not on file  . Food insecurity - inability: Not on file  . Transportation needs - medical: Not on file  . Transportation needs - non-medical: Not on file  Occupational History  . 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 Smoking status: Former Smoker    Last attempt to quit: 08/25/1961    Years since quitting: 56.1  . Smokeless tobacco: Never Used  Substance and Sexual Activity  . Alcohol use: No    Comment: rarely  . Drug use: No  . Sexual activity: Not on file  Other Topics Concern  . Not on file  Social History Narrative  . Not on file    Family Status  Relation Name Status  . Unknown  (Not Specified)  . Unknown  (Not Specified)  . Unknown  (Not Specified)  . Mother  Deceased  . Father  Deceased  . Daughter  Alive  . Son adopted [employer]  . Daughter adopted Alive  . Neg Hx  (Not Specified)    Review of Systems A complete 10 system ROS was obtained and was negative apart from what is mentioned.   Objective:   VITALS:   Vitals:   10/08/17 1255  BP: 98/60  Pulse: (!) 54  SpO2: 95%  Weight: 179 lb (81.2 kg)  Height: 5\' 6"  (1.676 m)   Gen:  Appears stated age and in NAD. HEENT:  Normocephalic, atraumatic. The mucous membranes are moist. The superficial temporal arteries are without ropiness or tenderness. Cardiovascular:  Bradycardic with regular rhythm. Lungs: Clear  to auscultation bilaterally. Neck: There are no carotid bruits noted bilaterally. MS:  No wasting of mm including of the APB bilterally  NEUROLOGICAL:  Orientation:  The patient is alert and oriented x 3.  Cranial nerves: There is good facial symmetry.  Extraocular muscles are intact and visual fields are full to confrontational testing. Speech is fluent and clear. Soft palate rises symmetrically and there is no tongue deviation. Hearing is intact to conversational tone. Tone: Tone is good throughout.  No rigidity Sensation: Sensation is 5/5 x 4.   Coordination:  The patient has no dysdiadichokinesia or dysmetria. Motor: Strength is 5/5 in the UE/LE.  Grip strength is good and equal bilaterally Gait and Station: The patient is able to ambulate without difficulty.   MOVEMENT EXAM: Tremor:  There is minimal tremor of outstretched hands.  There is a mild rest tremor in the thumbs bilaterally   Labs:  Lab Results  Component Value Date   TSH 0.70 09/22/2016     Chemistry      Component Value Date/Time   NA 140 03/03/2017 1507   K 4.8 03/03/2017 1507   CL 102 03/03/2017 1507   CO2 29 03/03/2017 1507   BUN 20 03/03/2017 1507   CREATININE 1.32 03/03/2017 1507      Component Value Date/Time   CALCIUM 10.5 03/03/2017 1507   ALKPHOS 68 09/22/2016 1153   AST 19 09/22/2016 1153   ALT 17 09/22/2016 1153   BILITOT 0.4 09/22/2016 1153     Lab Results  Component Value Date   VITAMINB12 332 09/22/2016     Assessment/Plan:   1.  Essential Tremor.  -continue 50 mg bid.  Risks, benefits, side effects and alternative therapies were discussed.  The opportunity to ask questions was given and they were answered to the best of my ability.  The patient expressed understanding and willingness to follow the outlined treatment protocols.  2.  Hand paresthesias.  -likely carpal tunnel syndrome.  He doesn't wish to pursue emg testing or injection right now.  Will give cock up wrist splint and  wear at least nightly.  3.  Follow up in one year, sooner if needed.  Will call me if wrist splint not helpful.    CC:  [name], MD

## 2017-10-08 ENCOUNTER — Ambulatory Visit: Payer: PPO | Admitting: Neurology

## 2017-10-08 ENCOUNTER — Encounter: Payer: Self-pay | Admitting: Neurology

## 2017-10-08 VITALS — BP 98/60 | HR 54 | Ht 66.0 in | Wt 179.0 lb

## 2017-10-08 DIAGNOSIS — G250 Essential tremor: Secondary | ICD-10-CM

## 2017-10-08 DIAGNOSIS — G5601 Carpal tunnel syndrome, right upper limb: Secondary | ICD-10-CM | POA: Diagnosis not present

## 2017-10-08 MED ORDER — WRIST SPLINT/COCK-UP/RIGHT L MISC: 0 refills | Status: DC

## 2017-11-16 ENCOUNTER — Other Ambulatory Visit: Payer: Self-pay

## 2017-11-16 ENCOUNTER — Encounter (HOSPITAL_BASED_OUTPATIENT_CLINIC_OR_DEPARTMENT_OTHER): Payer: Self-pay

## 2017-11-16 DIAGNOSIS — Y929 Unspecified place or not applicable: Secondary | ICD-10-CM | POA: Diagnosis not present

## 2017-11-16 DIAGNOSIS — S61412A Laceration without foreign body of left hand, initial encounter: Secondary | ICD-10-CM | POA: Diagnosis not present

## 2017-11-16 DIAGNOSIS — Y999 Unspecified external cause status: Secondary | ICD-10-CM | POA: Insufficient documentation

## 2017-11-16 DIAGNOSIS — Z7982 Long term (current) use of aspirin: Secondary | ICD-10-CM | POA: Diagnosis not present

## 2017-11-16 DIAGNOSIS — I129 Hypertensive chronic kidney disease with stage 1 through stage 4 chronic kidney disease, or unspecified chronic kidney disease: Secondary | ICD-10-CM | POA: Diagnosis not present

## 2017-11-16 DIAGNOSIS — I252 Old myocardial infarction: Secondary | ICD-10-CM | POA: Diagnosis not present

## 2017-11-16 DIAGNOSIS — Y93G1 Activity, food preparation and clean up: Secondary | ICD-10-CM | POA: Insufficient documentation

## 2017-11-16 DIAGNOSIS — Z79899 Other long term (current) drug therapy: Secondary | ICD-10-CM | POA: Diagnosis not present

## 2017-11-16 DIAGNOSIS — I2510 Atherosclerotic heart disease of native coronary artery without angina pectoris: Secondary | ICD-10-CM | POA: Insufficient documentation

## 2017-11-16 DIAGNOSIS — N183 Chronic kidney disease, stage 3 (moderate): Secondary | ICD-10-CM | POA: Insufficient documentation

## 2017-11-16 DIAGNOSIS — Z87891 Personal history of nicotine dependence: Secondary | ICD-10-CM | POA: Diagnosis not present

## 2017-11-16 DIAGNOSIS — W272XXA Contact with scissors, initial encounter: Secondary | ICD-10-CM | POA: Insufficient documentation

## 2017-11-16 DIAGNOSIS — S61512A Laceration without foreign body of left wrist, initial encounter: Secondary | ICD-10-CM | POA: Diagnosis not present

## 2017-11-16 NOTE — ED Triage Notes (Addendum)
Pt presents to triage with a laceration to left thumb/hand from scissors.. Pt wife applied "wound heal" to stop bleeding and bandage applied. Denies blood thinner. Pt tdap UTD

## 2017-11-17 ENCOUNTER — Emergency Department (HOSPITAL_BASED_OUTPATIENT_CLINIC_OR_DEPARTMENT_OTHER): Payer: PPO

## 2017-11-17 ENCOUNTER — Emergency Department (HOSPITAL_BASED_OUTPATIENT_CLINIC_OR_DEPARTMENT_OTHER)
Admission: EM | Admit: 2017-11-17 | Discharge: 2017-11-17 | Disposition: A | Discharge status: Home / Self Care | Payer: PPO | Attending: Emergency Medicine | Admitting: Emergency Medicine

## 2017-11-17 DIAGNOSIS — S61412A Laceration without foreign body of left hand, initial encounter: Secondary | ICD-10-CM | POA: Diagnosis not present

## 2017-11-17 DIAGNOSIS — S61512A Laceration without foreign body of left wrist, initial encounter: Secondary | ICD-10-CM | POA: Diagnosis not present

## 2017-11-17 MED ORDER — LIDOCAINE-EPINEPHRINE (PF) 2 %-1:200000 IJ SOLN
20.0000 mL | Freq: Once | INTRAMUSCULAR | Status: AC
  Administered 2017-11-17: 20 mL

## 2017-11-17 MED ORDER — AMOXICILLIN-POT CLAVULANATE 875-125 MG PO TABS: 1.0000 | ORAL_TABLET | Freq: Two times a day (BID) | ORAL | 0 refills | Status: DC

## 2017-11-17 MED ORDER — AMOXICILLIN-POT CLAVULANATE 875-125 MG PO TABS
1.0000 | ORAL_TABLET | Freq: Once | ORAL | Status: AC
  Administered 2017-11-17: 1 via ORAL
  Filled 2017-11-17: qty 1

## 2017-11-17 MED ORDER — LIDOCAINE-EPINEPHRINE (PF) 2 %-1:200000 IJ SOLN
INTRAMUSCULAR | Status: AC
  Administered 2017-11-17: 20 mL
  Filled 2017-11-17: qty 10

## 2017-11-17 NOTE — ED Provider Notes (Signed)
[facility] EMERGENCY DEPARTMENT Provider Note   CSN: [id number] Arrival date & time: 11/16/17  2340     History   Chief Complaint Chief Complaint  Patient presents with  . Laceration    HPI [name] is a 77 y.o. male.  Patient states he was trying to open up a can with scissors when he slipped in the scissors lacerated his left hand and thumb on the thenar eminence.  They state bleeding was extensive.  Patient's wife who is a former nurse applied a wound healing powder to stop the bleeding.  Patient does not take any blood thinners.  He denies any numbness, tingling or weakness in the thumb.  Tetanus is up-to-date.  The history is provided by the patient and a relative.  Laceration      Past Medical History:  Diagnosis Date  . Anemia   . Barrett's esophagus   . Blood transfusion without reported diagnosis   . CAD (coronary artery disease)    2002 stenting of the RCA,, 2006 Stenting of diag with DES, PTCA of Circ, 50% LAD  . Diverticulosis   . DJD (degenerative joint disease)   . Duodenal ulcer   . ED (erectile dysfunction)   . GERD (gastroesophageal reflux disease)   . Hemorrhoids   . Hyperlipidemia   . Hypertension   . Myocardial infarction [facility])     Patient Active Problem List   Diagnosis Date Noted  . Dyslipidemia 10/01/2017  . Coarse tremors 06/10/2016  . Routine general medical examination at a health care facility 02/07/2016  . CAD (coronary artery disease) 05/01/2015  . CKD (chronic kidney disease), stage III ([place]) 05/01/2015  . 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 BARRETTS ESOPHAGUS 01/24/2010  . 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 Hyperlipidemia with target LDL less than 70 09/21/2007  . Chronic ischemic heart disease 09/21/2007  . ERECTILE DYSFUNCTION 03/09/2007  . Essential hypertension 03/09/2007    Past Surgical History:  Procedure Laterality Date  . COLONOSCOPY    . ELBOW ARTHROSCOPY     . SPINAL FUSION    . SPINAL FUSION    . SPINE SURGERY     cervical  . UPPER GASTROINTESTINAL ENDOSCOPY          Home Medications    Prior to Admission medications   Medication Sig Start Date End Date Taking? Authorizing Provider  aspirin 81 MG EC tablet Take 81 mg by mouth daily.      [provider]  atenolol (TENORMIN) 50 MG tablet Take 1 tablet (50 mg total) by mouth 2 (two) times daily. 03/05/17   [name], MD  atorvastatin (LIPITOR) 20 MG tablet Take 1 tablet (20 mg total) by mouth daily. 03/05/17   [name], MD  Elastic Bandages & Supports (WRIST SPLINT/COCK-UP/RIGHT L) MISC 1 device to use daily 10/08/17   Tat, Eustace Quail, DO  fish oil-omega-3 fatty acids 1000 MG capsule Take 2 g by mouth daily.    [provider]  hydrochlorothiazide (HYDRODIURIL) 25 MG tablet Take 1 tablet (25 mg total) by mouth every other day. 03/05/17   [name], MD  lisinopril (PRINIVIL,ZESTRIL) 20 MG tablet Take 1 tablet (20 mg total) by mouth daily. 03/05/17   [name], MD  nitroGLYCERIN (NITROSTAT) 0.4 MG SL tablet Place 1 tablet (0.4 mg total) under the tongue every 5 (five) minutes as needed. Patient not taking: Reported on 10/08/2017 01/23/15   Dorena Cookey, MD  pantoprazole (PROTONIX) 40 MG tablet Take 1 tablet (40 mg total) by mouth daily. 03/05/17   [name], MD  potassium chloride (K-DUR) 10 MEQ tablet Take 1 tablet (10 mEq total) by mouth every Monday, Wednesday, and Friday. 03/06/17   [name], MD  primidone (MYSOLINE) 50 MG tablet Take 1 tablet (50 mg total) by mouth 2 (two) times daily. 10/02/17   Tat, Eustace Quail, DO  sildenafil (VIAGRA) 100 MG tablet Take 1 tablet (100 mg total) by mouth as needed for erectile dysfunction. 02/27/15   Dorena Cookey, MD  verapamil (CALAN-SR) 180 MG CR tablet Take 1 tablet (180 mg total) by mouth daily. 03/05/17   [name], MD    Family History Family History  Problem Relation Age of Onset  . Alcohol  abuse Unknown   . Coronary artery disease Unknown   . Alzheimer's disease Unknown   . Dementia Mother   . Colon cancer Neg Hx   . Esophageal cancer Neg Hx   . Rectal cancer Neg Hx   . Stomach cancer Neg Hx     Social History Social History   Tobacco Use  . Smoking status: Former Smoker    Last attempt to quit: 08/25/1961    Years since quitting: 56.2  . Smokeless tobacco: Never Used  Substance Use Topics  . Alcohol use: No    Comment: rarely  . Drug use: No     Allergies   Patient has no known allergies.   Review of Systems Review of Systems  Respiratory: Negative for cough and chest tightness.   Cardiovascular: Negative for chest pain.  Gastrointestinal: Negative for abdominal pain, nausea and vomiting.  Skin: Positive for wound.  Neurological: Negative for dizziness, numbness and headaches.    all other systems are negative except as noted in the HPI and PMH.    Physical Exam Updated Vital Signs BP (!) 158/87 (BP Location: Right Arm)   Pulse 63   Temp 98.8 F (37.1 C) (Oral)   Resp 18   Ht 5\' 6"  (1.676 m)   Wt 80.3 kg (177 lb)   SpO2 99%   BMI 28.57 kg/m   Physical Exam  Constitutional: He is oriented to person, place, and time. He appears well-developed and well-nourished. No distress.  HENT:  Head: Normocephalic and atraumatic.  Mouth/Throat: Oropharynx is clear and moist. No oropharyngeal exudate.  Eyes: Pupils are equal, round, and reactive to light. Conjunctivae and EOM are normal.  Neck: Normal range of motion. Neck supple.  No meningismus.  Cardiovascular: Normal rate, regular rhythm, normal heart sounds and intact distal pulses.  No murmur heard. Pulmonary/Chest: Effort normal and breath sounds normal. No respiratory distress.  Abdominal: Soft. There is no tenderness. There is no rebound and no guarding.  Musculoskeletal: Normal range of motion. He exhibits tenderness. He exhibits no edema.  There is a 3 cm laceration to the left thenar  eminence with quick clot dressing in place prior to evaluation.  Thumb extension, opposition, flexion is intact. Intact distal sensation. Intact radial pulse.  Neurological: He is alert and oriented to person, place, and time. No cranial nerve deficit. He exhibits normal muscle tone. Coordination normal.  No ataxia on finger to nose bilaterally. No pronator drift. 5/5 strength throughout. CN 2-12 intact.Equal grip strength. Sensation intact.   Skin: Skin is warm. Capillary refill takes less than 2 seconds.  Psychiatric: He has a normal mood and affect. His behavior is normal.  Nursing note and vitals  reviewed.    ED Treatments / Results  Labs (all labs ordered are listed, but only abnormal results are displayed) Labs Reviewed - No data to display  EKG None  Radiology Dg Wrist Complete Left  Result Date: 11/17/2017 CLINICAL DATA:  Laceration with scissors EXAM: LEFT WRIST - COMPLETE 3+ VIEW; LEFT HAND - COMPLETE 3+ VIEW COMPARISON:  None. FINDINGS: Laceration of the soft tissues lateral to the proximal left thumb. No radiopaque foreign body. No acute fracture or dislocation. No arthropathy. IMPRESSION: Laceration of the soft tissues lateral to the left thumb. No acute osseous injury or radiopaque foreign body. Electronically Signed   By: Ulyses Jarred M.D.   On: 11/17/2017 00:42   Dg Hand Complete Left  Result Date: 11/17/2017 CLINICAL DATA:  Laceration with scissors EXAM: LEFT WRIST - COMPLETE 3+ VIEW; LEFT HAND - COMPLETE 3+ VIEW COMPARISON:  None. FINDINGS: Laceration of the soft tissues lateral to the proximal left thumb. No radiopaque foreign body. No acute fracture or dislocation. No arthropathy. IMPRESSION: Laceration of the soft tissues lateral to the left thumb. No acute osseous injury or radiopaque foreign body. Electronically Signed   By: Ulyses Jarred M.D.   On: 11/17/2017 00:42    Procedures .Marland KitchenLaceration Repair Date/Time: 11/17/2017 4:08 AM Performed by: Ezequiel Essex,  MD Authorized by: Ezequiel Essex, MD   Consent:    Consent obtained:  Verbal   Consent given by:  Patient   Risks discussed:  Infection, pain and need for additional repair   Alternatives discussed:  No treatment Anesthesia (see MAR for exact dosages):    Anesthesia method:  Local infiltration   Local anesthetic:  Lidocaine 2% WITH epi Laceration details:    Location:  Hand   Hand location:  L hand, dorsum   Length (cm):  3 Repair type:    Repair type:  Simple Pre-procedure details:    Preparation:  Patient was prepped and draped in usual sterile fashion Exploration:    Hemostasis achieved with:  Epinephrine and direct pressure   Wound exploration: wound explored through full range of motion and entire depth of wound probed and visualized     Wound extent: no foreign bodies/material noted, no nerve damage noted, no tendon damage noted and no vascular damage noted     Contaminated: no   Treatment:    Area cleansed with:  Betadine   Amount of cleaning:  Extensive   Irrigation solution:  Sterile saline   Irrigation method:  Syringe   Visualized foreign bodies/material removed: no   Skin repair:    Repair method:  Sutures   Suture size:  5-0   Suture material:  Nylon   Suture technique:  Simple interrupted   Number of sutures:  5 Approximation:    Approximation:  Close Post-procedure details:    Dressing:  Antibiotic ointment and adhesive bandage   Patient tolerance of procedure:  Tolerated well, no immediate complications   (including critical care time)  Medications Ordered in ED Medications  lidocaine-EPINEPHrine (XYLOCAINE W/EPI) 2 %-1:200000 (PF) injection 20 mL (has no administration in time range)     Initial Impression / Assessment and Plan / ED Course  I have reviewed the triage vital signs and the nursing notes.  Pertinent labs & imaging results that were available during my care of the patient were reviewed by me and considered in my medical decision  making (see chart for details).    Patient with left hand laceration from scissors.  Neurovascularly intact.  Tetanus is  up-to-date.  Wound extensively cleaned.  Patient has no weakness, numbness or tingling.  Full range of motion of thumb.  He is given prophylactic antibiotics.  Follow-up for suture removal in 1 week.  Return precautions discussed.  Final Clinical Impressions(s) / ED Diagnoses   Final diagnoses:  Laceration of left hand without foreign body, initial encounter    ED Discharge Orders        Ordered    amoxicillin-clavulanate (AUGMENTIN) 875-125 MG tablet  Every 12 hours     11/17/17 0402       Ezequiel Essex, MD 11/17/17 [phone]

## 2017-11-17 NOTE — Discharge Instructions (Addendum)
Follow-up in 1 week for suture removal.  Take the antibiotics as prescribed.  Return to the ED if you develop new or worsening symptoms.

## 2017-11-17 NOTE — ED Notes (Signed)
ED Provider at bedside. 

## 2017-12-09 DIAGNOSIS — D044 Carcinoma in situ of skin of scalp and neck: Secondary | ICD-10-CM | POA: Diagnosis not present

## 2017-12-09 DIAGNOSIS — D0421 Carcinoma in situ of skin of right ear and external auricular canal: Secondary | ICD-10-CM | POA: Diagnosis not present

## 2018-02-14 ENCOUNTER — Other Ambulatory Visit: Payer: Self-pay | Admitting: Internal Medicine

## 2018-02-14 DIAGNOSIS — I10 Essential (primary) hypertension: Secondary | ICD-10-CM

## 2018-02-14 DIAGNOSIS — I2510 Atherosclerotic heart disease of native coronary artery without angina pectoris: Secondary | ICD-10-CM

## 2018-02-14 DIAGNOSIS — E785 Hyperlipidemia, unspecified: Secondary | ICD-10-CM

## 2018-02-15 DIAGNOSIS — R3911 Hesitancy of micturition: Secondary | ICD-10-CM | POA: Diagnosis not present

## 2018-02-15 DIAGNOSIS — R3121 Asymptomatic microscopic hematuria: Secondary | ICD-10-CM | POA: Diagnosis not present

## 2018-02-15 DIAGNOSIS — N401 Enlarged prostate with lower urinary tract symptoms: Secondary | ICD-10-CM | POA: Diagnosis not present

## 2018-02-15 DIAGNOSIS — N5201 Erectile dysfunction due to arterial insufficiency: Secondary | ICD-10-CM | POA: Diagnosis not present

## 2018-02-15 LAB — PSA: PSA: 2.62

## 2018-03-08 ENCOUNTER — Ambulatory Visit (INDEPENDENT_AMBULATORY_CARE_PROVIDER_SITE_OTHER): Payer: PPO | Admitting: Internal Medicine

## 2018-03-08 ENCOUNTER — Encounter: Payer: Self-pay | Admitting: Internal Medicine

## 2018-03-08 ENCOUNTER — Other Ambulatory Visit (INDEPENDENT_AMBULATORY_CARE_PROVIDER_SITE_OTHER): Payer: PPO

## 2018-03-08 ENCOUNTER — Ambulatory Visit (INDEPENDENT_AMBULATORY_CARE_PROVIDER_SITE_OTHER)
Admission: RE | Admit: 2018-03-08 | Discharge: 2018-03-08 | Disposition: A | Discharge status: Home / Self Care | Payer: PPO | Source: Ambulatory Visit | Attending: Internal Medicine | Admitting: Internal Medicine

## 2018-03-08 VITALS — BP 128/72 | HR 55 | Temp 97.8°F | Resp 16 | Ht 66.0 in | Wt 181.0 lb

## 2018-03-08 DIAGNOSIS — I2510 Atherosclerotic heart disease of native coronary artery without angina pectoris: Secondary | ICD-10-CM

## 2018-03-08 DIAGNOSIS — E785 Hyperlipidemia, unspecified: Secondary | ICD-10-CM | POA: Diagnosis not present

## 2018-03-08 DIAGNOSIS — I10 Essential (primary) hypertension: Secondary | ICD-10-CM

## 2018-03-08 DIAGNOSIS — N183 Chronic kidney disease, stage 3 unspecified: Secondary | ICD-10-CM

## 2018-03-08 DIAGNOSIS — K219 Gastro-esophageal reflux disease without esophagitis: Secondary | ICD-10-CM | POA: Diagnosis not present

## 2018-03-08 DIAGNOSIS — K2270 Barrett's esophagus without dysplasia: Secondary | ICD-10-CM | POA: Diagnosis not present

## 2018-03-08 DIAGNOSIS — M542 Cervicalgia: Secondary | ICD-10-CM

## 2018-03-08 DIAGNOSIS — Z0000 Encounter for general adult medical examination without abnormal findings: Secondary | ICD-10-CM

## 2018-03-08 DIAGNOSIS — S199XXA Unspecified injury of neck, initial encounter: Secondary | ICD-10-CM | POA: Diagnosis not present

## 2018-03-08 LAB — CBC WITH DIFFERENTIAL/PLATELET
BASOS ABS: 0.1 10*3/uL (ref 0.0–0.1)
Basophils Relative: 0.7 % (ref 0.0–3.0)
Eosinophils Absolute: 0.3 10*3/uL (ref 0.0–0.7)
Eosinophils Relative: 3.6 % (ref 0.0–5.0)
HCT: 43.9 % (ref 39.0–52.0)
Hemoglobin: 14.7 g/dL (ref 13.0–17.0)
LYMPHS ABS: 3.5 10*3/uL (ref 0.7–4.0)
Lymphocytes Relative: 40.3 % (ref 12.0–46.0)
MCHC: 33.5 g/dL (ref 30.0–36.0)
MCV: 92.3 fl (ref 78.0–100.0)
MONO ABS: 0.8 10*3/uL (ref 0.1–1.0)
Monocytes Relative: 8.8 % (ref 3.0–12.0)
NEUTROS PCT: 46.6 % (ref 43.0–77.0)
Neutro Abs: 4 10*3/uL (ref 1.4–7.7)
PLATELETS: 238 10*3/uL (ref 150.0–400.0)
RBC: 4.75 Mil/uL (ref 4.22–5.81)
RDW: 14.2 % (ref 11.5–15.5)
WBC: 8.6 10*3/uL (ref 4.0–10.5)

## 2018-03-08 LAB — LIPID PANEL
CHOL/HDL RATIO: 3
Cholesterol: 139 mg/dL (ref 0–200)
HDL: 50.4 mg/dL (ref >39.00)
LDL CALC: 59 mg/dL (ref 0–99)
NONHDL: 88.72
TRIGLYCERIDES: 149 mg/dL (ref 0.0–149.0)
VLDL: 29.8 mg/dL (ref 0.0–40.0)

## 2018-03-08 LAB — COMPREHENSIVE METABOLIC PANEL
ALT: 18 U/L (ref 0–53)
AST: 21 U/L (ref 0–37)
Albumin: 4.2 g/dL (ref 3.5–5.2)
Alkaline Phosphatase: 63 U/L (ref 39–117)
BUN: 20 mg/dL (ref 6–23)
CALCIUM: 9.9 mg/dL (ref 8.4–10.5)
CHLORIDE: 104 meq/L (ref 96–112)
CO2: 29 meq/L (ref 19–32)
Creatinine, Ser: 1.46 mg/dL (ref 0.40–1.50)
GFR: 49.44 mL/min — AB (ref >60.00)
GLUCOSE: 102 mg/dL — AB (ref 70–99)
POTASSIUM: 5.1 meq/L (ref 3.5–5.1)
Sodium: 140 mEq/L (ref 135–145)
Total Bilirubin: 0.4 mg/dL (ref 0.2–1.2)
Total Protein: 7.2 g/dL (ref 6.0–8.3)

## 2018-03-08 LAB — URINALYSIS, ROUTINE W REFLEX MICROSCOPIC
BILIRUBIN URINE: NEGATIVE
KETONES UR: NEGATIVE
Leukocytes, UA: NEGATIVE
NITRITE: NEGATIVE
SPECIFIC GRAVITY, URINE: 1.015 (ref 1.000–1.030)
Total Protein, Urine: NEGATIVE
URINE GLUCOSE: NEGATIVE
UROBILINOGEN UA: 0.2 (ref 0.0–1.0)
pH: 6 (ref 5.0–8.0)

## 2018-03-08 LAB — TSH: TSH: 1.03 u[IU]/mL (ref 0.35–4.50)

## 2018-03-08 NOTE — Progress Notes (Signed)
Subjective:  Patient ID: [name], male    DOB: 10/20/1940  Age: 77 y.o. MRN: [id number]  CC: Hypertension; Hyperlipidemia; and Annual Exam   HPI SHON INDELICATO presents for a CPX.  He was in the shower about 6 weeks ago and says he lost his footing and fell.  He did not hit his head and does not complain of headache or loss of consciousness.  He complains of mild, diffuse, bilateral posterior neck pain that does not radiate towards his arms.  He also denies paresthesias.  He had neck surgery done many years ago and is concerned that some of the hardware may have been broken loose.  He tells me his blood pressure has been well controlled.  He works out on the elliptical for 30 minutes about 4 times a week and denies DOE, CP, palpitations, edema, or fatigue.  Past Medical History:  Diagnosis Date  . Anemia   . Barrett's esophagus   . Blood transfusion without reported diagnosis   . CAD (coronary artery disease)    2002 stenting of the RCA,, 2006 Stenting of diag with DES, PTCA of Circ, 50% LAD  . Diverticulosis   . DJD (degenerative joint disease)   . Duodenal ulcer   . ED (erectile dysfunction)   . GERD (gastroesophageal reflux disease)   . Hemorrhoids   . Hyperlipidemia   . Hypertension   . Myocardial infarction [facility])    Past Surgical History:  Procedure Laterality Date  . COLONOSCOPY    . ELBOW ARTHROSCOPY    . SPINAL FUSION    . SPINAL FUSION    . SPINE SURGERY     cervical  . UPPER GASTROINTESTINAL ENDOSCOPY      reports that he quit smoking about 56 years ago. He has never used smokeless tobacco. He reports that he does not drink alcohol or use drugs. family history includes Alcohol abuse in his unknown relative; Alzheimer's disease in his unknown relative; Coronary artery disease in his unknown relative; Dementia in his mother. No Known Allergies  Outpatient Medications Prior to Visit  Medication Sig Dispense Refill  . aspirin 81 MG EC tablet Take 81 mg  by mouth daily.      . Elastic Bandages & Supports (WRIST SPLINT/COCK-UP/RIGHT L) MISC 1 device to use daily 1 each 0  . fish oil-omega-3 fatty acids 1000 MG capsule Take 2 g by mouth daily.    . nitroGLYCERIN (NITROSTAT) 0.4 MG SL tablet Place 1 tablet (0.4 mg total) under the tongue every 5 (five) minutes as needed. 25 tablet 1  . primidone (MYSOLINE) 50 MG tablet Take 1 tablet (50 mg total) by mouth 2 (two) times daily. 180 tablet 1  . sildenafil (VIAGRA) 100 MG tablet Take 1 tablet (100 mg total) by mouth as needed for erectile dysfunction. 6 tablet 11  . amoxicillin-clavulanate (AUGMENTIN) 875-125 MG tablet Take 1 tablet by mouth every 12 (twelve) hours. 20 tablet 0  . atenolol (TENORMIN) 50 MG tablet Take 1 tablet (50 mg total) by mouth 2 (two) times daily. 200 tablet 3  . atorvastatin (LIPITOR) 20 MG tablet Take 1 tablet (20 mg total) by mouth daily. 90 tablet 3  . hydrochlorothiazide (HYDRODIURIL) 25 MG tablet Take 1 tablet (25 mg total) by mouth every other day. 45 tablet 3  . lisinopril (PRINIVIL,ZESTRIL) 20 MG tablet Take 1 tablet (20 mg total) by mouth daily. 90 tablet 3  . pantoprazole (PROTONIX) 40 MG tablet Take 1 tablet (40 mg total)  by mouth daily. 90 tablet 3  . potassium chloride (K-DUR) 10 MEQ tablet Take 1 tablet (10 mEq total) by mouth every Monday, Wednesday, and Friday. 45 tablet 3  . verapamil (CALAN-SR) 180 MG CR tablet Take 1 tablet (180 mg total) by mouth daily. 90 tablet 3   No facility-administered medications prior to visit.     ROS Review of Systems  Constitutional: Negative.  Negative for appetite change, diaphoresis, fatigue and unexpected weight change.  HENT: Negative.   Eyes: Negative for visual disturbance.  Respiratory: Negative for apnea, choking and shortness of breath.   Cardiovascular: Negative for chest pain, palpitations and leg swelling.  Gastrointestinal: Negative for abdominal pain, constipation, diarrhea, nausea and vomiting.  Endocrine:  Negative.   Genitourinary: Negative.  Negative for difficulty urinating, dysuria, hematuria and urgency.  Musculoskeletal: Positive for neck pain and neck stiffness. Negative for arthralgias, back pain and myalgias.       He has had recurrent episodes of bilateral, posterior, nonradiating neck pain 6 weeks status post a fall in the shower at home.  He denies numbness, weakness, or tingling in his extremities.  Skin: Negative for color change and pallor.  Neurological: Negative.  Negative for dizziness, weakness, light-headedness, numbness and headaches.  Hematological: Negative for adenopathy. Does not bruise/bleed easily.  Psychiatric/Behavioral: Negative.     Objective:  BP 128/72 (BP Location: Left Arm, Patient Position: Sitting, Cuff Size: Normal)   Pulse (!) 55   Temp 97.8 F (36.6 C) (Oral)   Resp 16   Ht 5\' 6"  (1.676 m)   Wt 181 lb (82.1 kg)   SpO2 94%   BMI 29.21 kg/m   BP Readings from Last 3 Encounters:  03/08/18 128/72  11/17/17 (!) 156/84  10/08/17 98/60    Wt Readings from Last 3 Encounters:  03/08/18 181 lb (82.1 kg)  11/16/17 177 lb (80.3 kg)  10/08/17 179 lb (81.2 kg)    Physical Exam  Constitutional: He is oriented to person, place, and time. No distress.  HENT:  Mouth/Throat: Oropharynx is clear and moist. No oropharyngeal exudate.  Eyes: Conjunctivae are normal. No scleral icterus.  Neck: Normal range of motion. Neck supple. No JVD present. No thyromegaly present.  Cardiovascular: Regular rhythm and normal heart sounds. Bradycardia present. Exam reveals no gallop.  No murmur heard. Pulmonary/Chest: Effort normal and breath sounds normal. He has no wheezes. He has no rales.  Abdominal: Soft. Normal appearance and bowel sounds are normal. He exhibits no mass. There is no hepatosplenomegaly. There is no tenderness.  Musculoskeletal: Normal range of motion. He exhibits no edema, tenderness or deformity.       Cervical back: Normal. He exhibits normal range  of motion, no tenderness, no bony tenderness, no swelling, no edema, no deformity and no pain.  Lymphadenopathy:    He has no cervical adenopathy.  Neurological: He is alert and oriented to person, place, and time. He has normal strength and normal reflexes. He displays no atrophy and no tremor. No sensory deficit. He exhibits normal muscle tone. He displays no seizure activity.  Skin: Skin is warm and dry. No rash noted. He is not diaphoretic.  Vitals reviewed.   Lab Results  Component Value Date   WBC 8.6 03/08/2018   HGB 14.7 03/08/2018   HCT 43.9 03/08/2018   PLT 238.0 03/08/2018   GLUCOSE 102 (H) 03/08/2018   CHOL 139 03/08/2018   TRIG 149.0 03/08/2018   HDL 50.40 03/08/2018   LDLCALC 59 03/08/2018   ALT  18 03/08/2018   AST 21 03/08/2018   NA 140 03/08/2018   K 5.1 03/08/2018   CL 104 03/08/2018   CREATININE 1.46 03/08/2018   BUN 20 03/08/2018   CO2 29 03/08/2018   TSH 1.03 03/08/2018   PSA 2.62 02/15/2018    Dg Wrist Complete Left  Result Date: 11/17/2017 CLINICAL DATA:  Laceration with scissors EXAM: LEFT WRIST - COMPLETE 3+ VIEW; LEFT HAND - COMPLETE 3+ VIEW COMPARISON:  None. FINDINGS: Laceration of the soft tissues lateral to the proximal left thumb. No radiopaque foreign body. No acute fracture or dislocation. No arthropathy. IMPRESSION: Laceration of the soft tissues lateral to the left thumb. No acute osseous injury or radiopaque foreign body. Electronically Signed   By: Ulyses Jarred M.D.   On: 11/17/2017 00:42   Dg Hand Complete Left  Result Date: 11/17/2017 CLINICAL DATA:  Laceration with scissors EXAM: LEFT WRIST - COMPLETE 3+ VIEW; LEFT HAND - COMPLETE 3+ VIEW COMPARISON:  None. FINDINGS: Laceration of the soft tissues lateral to the proximal left thumb. No radiopaque foreign body. No acute fracture or dislocation. No arthropathy. IMPRESSION: Laceration of the soft tissues lateral to the left thumb. No acute osseous injury or radiopaque foreign body.  Electronically Signed   By: Ulyses Jarred M.D.   On: 11/17/2017 00:42    Assessment & Plan:   Roarke was seen today for hypertension, hyperlipidemia and annual exam.  Diagnoses and all orders for this visit:  Coronary artery disease involving native coronary artery of native heart without angina pectoris- He had no recent episodes of angina.  Will continue risk factor modifications. -     Lipid panel; Future -     atorvastatin (LIPITOR) 20 MG tablet; Take 1 tablet (20 mg total) by mouth daily.  Hyperlipidemia with target LDL less than 70- He has achieved his LDL goal and is doing well on the statin. -     Lipid panel; Future -     TSH; Future -     atorvastatin (LIPITOR) 20 MG tablet; Take 1 tablet (20 mg total) by mouth daily.  Essential hypertension- His blood pressure is well controlled.  Electrolytes and renal function are normal.  Will continue the current regimen. -     CBC with Differential/Platelet; Future -     Comprehensive metabolic panel; Future -     verapamil (CALAN-SR) 180 MG CR tablet; Take 1 tablet (180 mg total) by mouth daily. -     lisinopril (PRINIVIL,ZESTRIL) 20 MG tablet; Take 1 tablet (20 mg total) by mouth daily. -     hydrochlorothiazide (HYDRODIURIL) 25 MG tablet; Take 1 tablet (25 mg total) by mouth every other day. -     potassium chloride (K-DUR) 10 MEQ tablet; Take 1 tablet (10 mEq total) by mouth every Monday, Wednesday, and Friday. -     atenolol (TENORMIN) 50 MG tablet; Take 1 tablet (50 mg total) by mouth 2 (two) times daily.  CKD (chronic kidney disease), stage III ([place])- His renal function is stable.  Will maintain good blood pressure control.  He will avoid nephrotoxic agents. -     CBC with Differential/Platelet; Future -     Comprehensive metabolic panel; Future -     Urinalysis, Routine w reflex microscopic; Future  Neck pain, bilateral posterior- He has minimal symptoms, his exam is neurologically intact, plain films show that the hardware is  stable.  He does have DDD which she is not interested in treating. -  DG Cervical Spine Complete; Future  GERD without esophagitis- His symptoms are well controlled with the PPI. -     pantoprazole (PROTONIX) 40 MG tablet; Take 1 tablet (40 mg total) by mouth daily.  Barrett's esophagus without dysplasia -     pantoprazole (PROTONIX) 40 MG tablet; Take 1 tablet (40 mg total) by mouth daily.   I have discontinued Weldon Nouri. Critzer's amoxicillin-clavulanate. I am also having him maintain his aspirin, fish oil-omega-3 fatty acids, nitroGLYCERIN, sildenafil, primidone, Wrist Splint/Cock-Up/Right L, verapamil, lisinopril, hydrochlorothiazide, pantoprazole, potassium chloride, atorvastatin, and atenolol.  Meds ordered this encounter  Medications  . verapamil (CALAN-SR) 180 MG CR tablet    Sig: Take 1 tablet (180 mg total) by mouth daily.    Dispense:  90 tablet    Refill:  1  . lisinopril (PRINIVIL,ZESTRIL) 20 MG tablet    Sig: Take 1 tablet (20 mg total) by mouth daily.    Dispense:  90 tablet    Refill:  1  . hydrochlorothiazide (HYDRODIURIL) 25 MG tablet    Sig: Take 1 tablet (25 mg total) by mouth every other day.    Dispense:  45 tablet    Refill:  1  . pantoprazole (PROTONIX) 40 MG tablet    Sig: Take 1 tablet (40 mg total) by mouth daily.    Dispense:  90 tablet    Refill:  1  . potassium chloride (K-DUR) 10 MEQ tablet    Sig: Take 1 tablet (10 mEq total) by mouth every Monday, Wednesday, and Friday.    Dispense:  45 tablet    Refill:  1  . atorvastatin (LIPITOR) 20 MG tablet    Sig: Take 1 tablet (20 mg total) by mouth daily.    Dispense:  90 tablet    Refill:  1  . atenolol (TENORMIN) 50 MG tablet    Sig: Take 1 tablet (50 mg total) by mouth 2 (two) times daily.    Dispense:  180 tablet    Refill:  1   See AVS for instructions about healthy living and anticipatory guidance.  Follow-up: Return in about 6 months (around 09/08/2018).  Scarlette Calico, MD

## 2018-03-08 NOTE — Patient Instructions (Signed)

## 2018-03-09 ENCOUNTER — Other Ambulatory Visit: Payer: Self-pay | Admitting: Internal Medicine

## 2018-03-09 DIAGNOSIS — I2510 Atherosclerotic heart disease of native coronary artery without angina pectoris: Secondary | ICD-10-CM

## 2018-03-09 DIAGNOSIS — I10 Essential (primary) hypertension: Secondary | ICD-10-CM

## 2018-03-09 DIAGNOSIS — E785 Hyperlipidemia, unspecified: Secondary | ICD-10-CM

## 2018-03-09 MED ORDER — LISINOPRIL 20 MG PO TABS: 20.0000 mg | ORAL_TABLET | Freq: Every day | ORAL | 1 refills | Status: DC

## 2018-03-09 MED ORDER — HYDROCHLOROTHIAZIDE 25 MG PO TABS: 25.0000 mg | ORAL_TABLET | ORAL | 1 refills | Status: DC

## 2018-03-09 MED ORDER — POTASSIUM CHLORIDE ER 10 MEQ PO TBCR: 10.0000 meq | EXTENDED_RELEASE_TABLET | ORAL | 1 refills | Status: DC

## 2018-03-09 MED ORDER — ATORVASTATIN CALCIUM 20 MG PO TABS
20.0000 mg | ORAL_TABLET | Freq: Every day | ORAL | 1 refills | Status: DC
Start: 2018-03-09 — End: 2018-09-04

## 2018-03-09 MED ORDER — VERAPAMIL HCL ER 180 MG PO TBCR: 180.0000 mg | EXTENDED_RELEASE_TABLET | Freq: Every day | ORAL | 1 refills | Status: DC

## 2018-03-09 MED ORDER — ATENOLOL 50 MG PO TABS: 50.0000 mg | ORAL_TABLET | Freq: Two times a day (BID) | ORAL | 1 refills | Status: DC

## 2018-03-09 MED ORDER — PANTOPRAZOLE SODIUM 40 MG PO TBEC: 40.0000 mg | DELAYED_RELEASE_TABLET | Freq: Every day | ORAL | 1 refills | Status: DC

## 2018-03-09 NOTE — Assessment & Plan Note (Signed)

## 2018-04-14 ENCOUNTER — Other Ambulatory Visit: Payer: Self-pay | Admitting: Neurology

## 2018-04-27 DIAGNOSIS — H40013 Open angle with borderline findings, low risk, bilateral: Secondary | ICD-10-CM | POA: Diagnosis not present

## 2018-04-27 DIAGNOSIS — H2513 Age-related nuclear cataract, bilateral: Secondary | ICD-10-CM | POA: Diagnosis not present

## 2018-04-27 DIAGNOSIS — H40053 Ocular hypertension, bilateral: Secondary | ICD-10-CM | POA: Diagnosis not present

## 2018-04-27 DIAGNOSIS — H353112 Nonexudative age-related macular degeneration, right eye, intermediate dry stage: Secondary | ICD-10-CM | POA: Diagnosis not present

## 2018-05-19 IMAGING — DX DG HAND COMPLETE 3+V*L*
3 series · 3 of 3 positions shown · non-contrast
Comparison: None.

CLINICAL DATA: Laceration with scissors

EXAM:
LEFT WRIST - COMPLETE 3+ VIEW; LEFT HAND - COMPLETE 3+ VIEW

[hand pa]
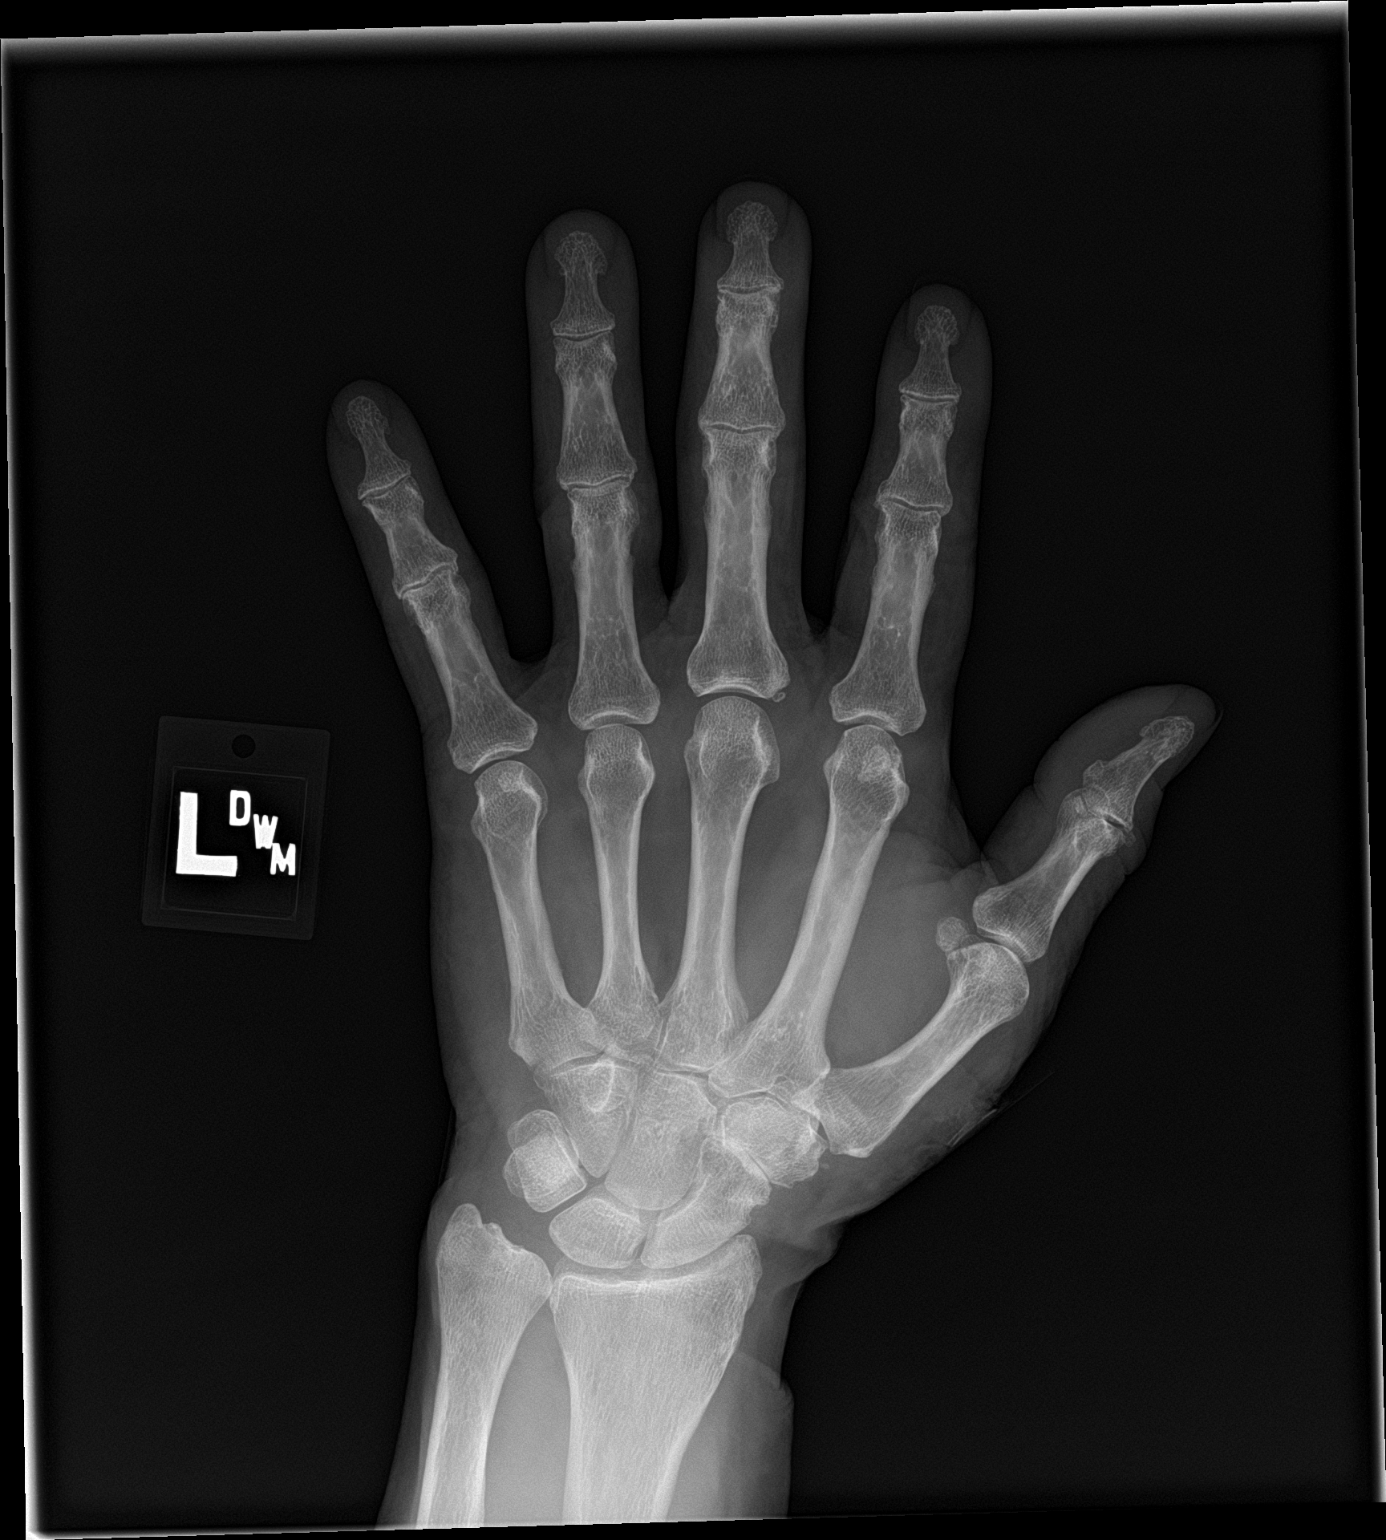

[hand obl]
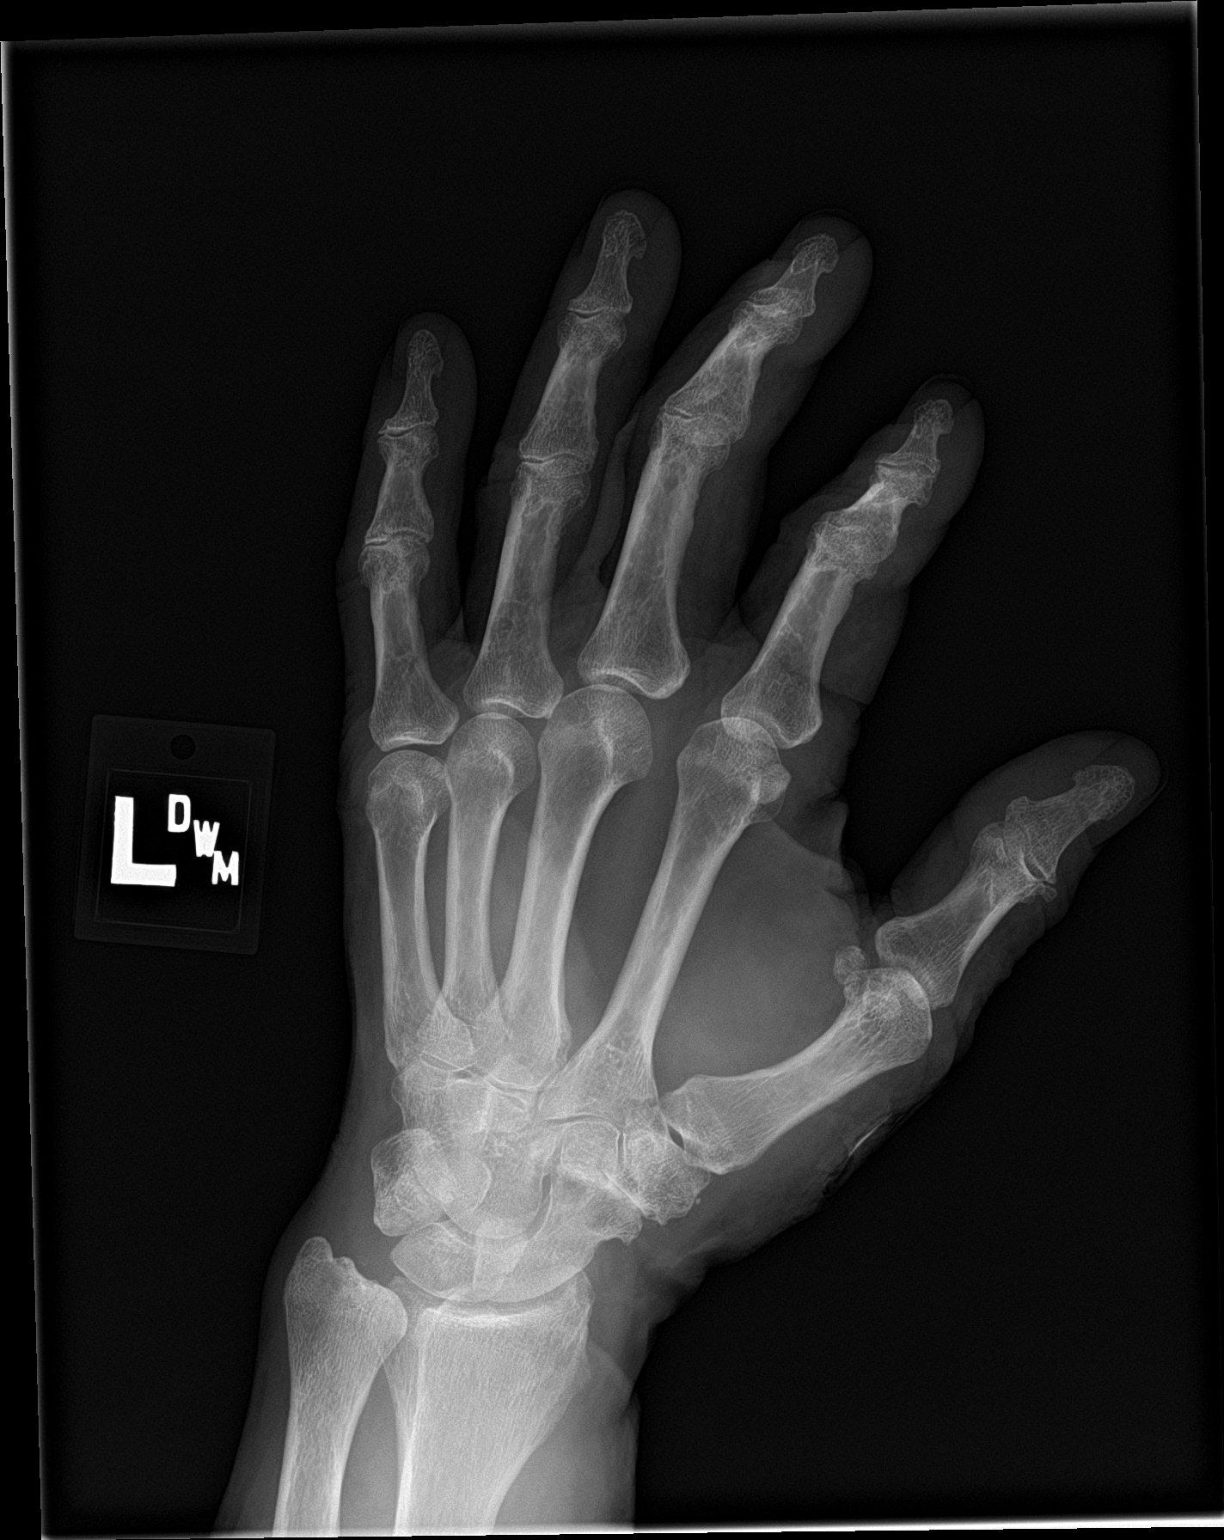

[hand lat]
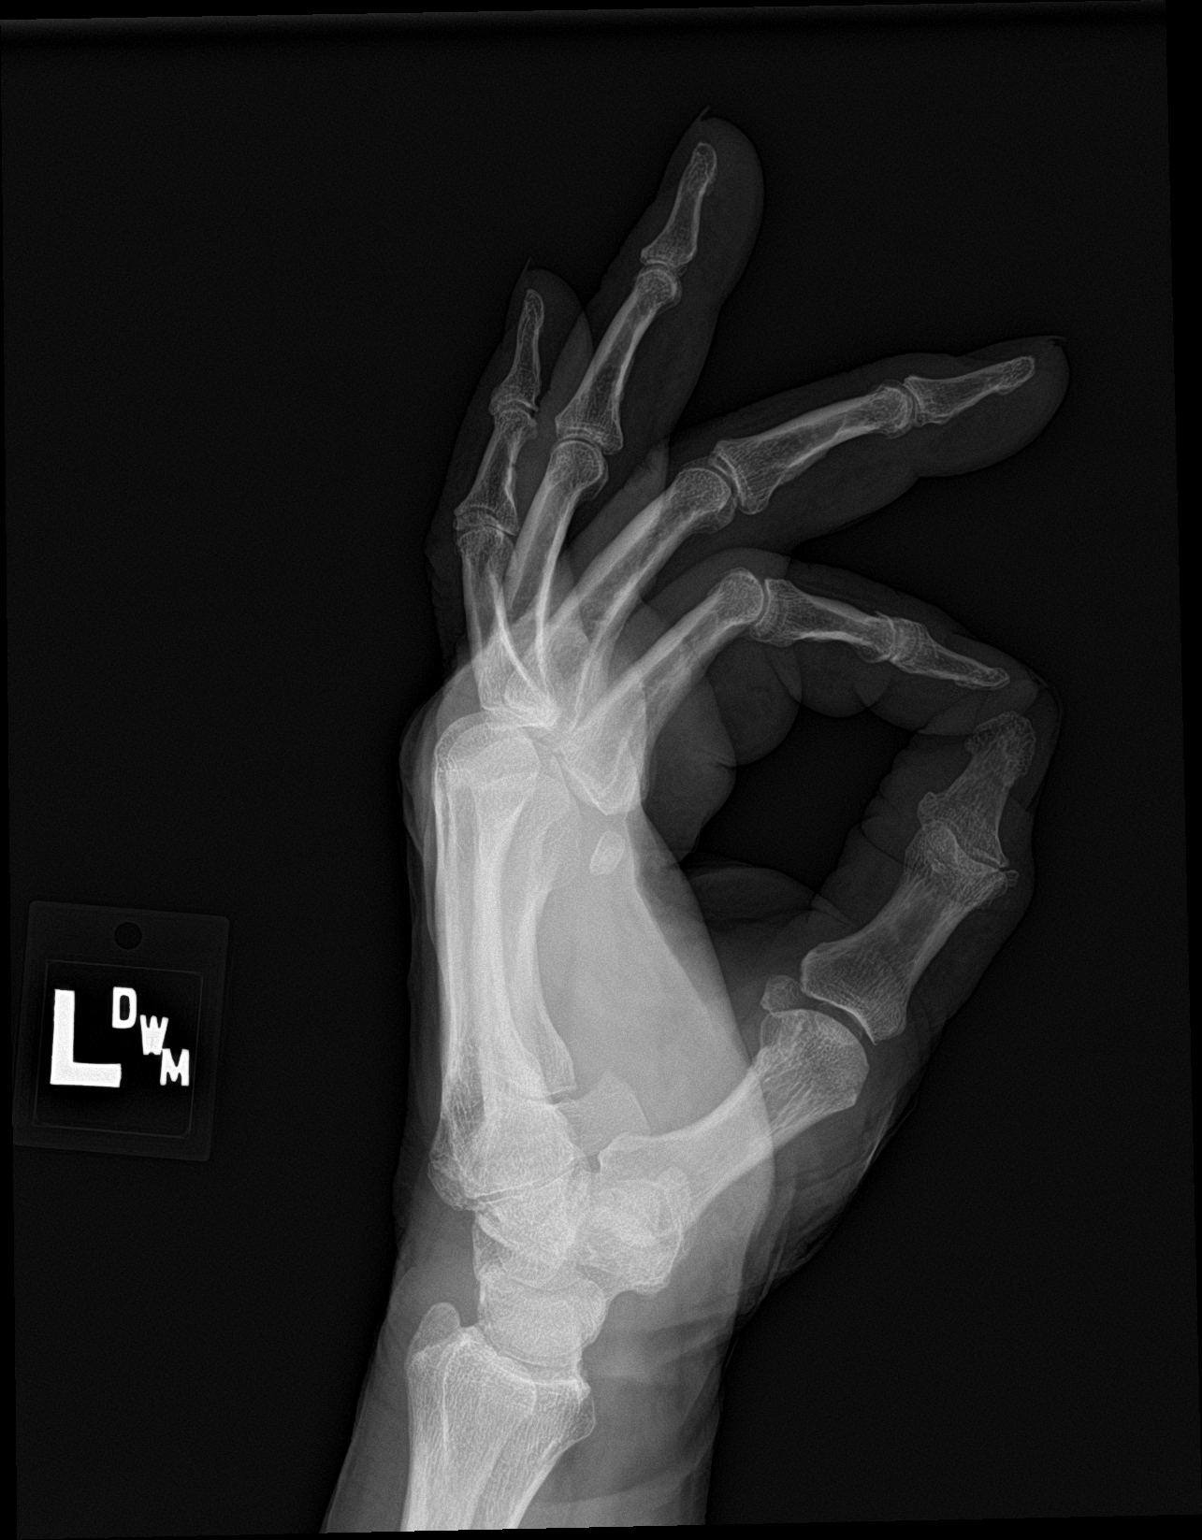

[3 of 3 positions shown; findings below may reference images not displayed]

FINDINGS: Laceration of the soft tissues lateral to the proximal left thumb.
No radiopaque foreign body. No acute fracture or dislocation. No
arthropathy.
IMPRESSION: Laceration of the soft tissues lateral to the left thumb. No acute
osseous injury or radiopaque foreign body.

## 2018-06-14 ENCOUNTER — Ambulatory Visit (INDEPENDENT_AMBULATORY_CARE_PROVIDER_SITE_OTHER): Payer: PPO

## 2018-06-14 DIAGNOSIS — Z23 Encounter for immunization: Secondary | ICD-10-CM

## 2018-07-07 ENCOUNTER — Other Ambulatory Visit: Payer: Self-pay | Admitting: Dermatology

## 2018-07-07 DIAGNOSIS — D485 Neoplasm of uncertain behavior of skin: Secondary | ICD-10-CM | POA: Diagnosis not present

## 2018-07-07 DIAGNOSIS — D044 Carcinoma in situ of skin of scalp and neck: Secondary | ICD-10-CM | POA: Diagnosis not present

## 2018-07-07 DIAGNOSIS — L570 Actinic keratosis: Secondary | ICD-10-CM | POA: Diagnosis not present

## 2018-08-06 DIAGNOSIS — H1045 Other chronic allergic conjunctivitis: Secondary | ICD-10-CM | POA: Diagnosis not present

## 2018-09-01 ENCOUNTER — Other Ambulatory Visit: Payer: Self-pay | Admitting: Internal Medicine

## 2018-09-01 DIAGNOSIS — K219 Gastro-esophageal reflux disease without esophagitis: Secondary | ICD-10-CM

## 2018-09-01 DIAGNOSIS — I10 Essential (primary) hypertension: Secondary | ICD-10-CM

## 2018-09-01 DIAGNOSIS — K2270 Barrett's esophagus without dysplasia: Secondary | ICD-10-CM

## 2018-09-04 ENCOUNTER — Other Ambulatory Visit: Payer: Self-pay | Admitting: Internal Medicine

## 2018-09-04 DIAGNOSIS — I2510 Atherosclerotic heart disease of native coronary artery without angina pectoris: Secondary | ICD-10-CM

## 2018-09-04 DIAGNOSIS — I10 Essential (primary) hypertension: Secondary | ICD-10-CM

## 2018-09-04 DIAGNOSIS — E785 Hyperlipidemia, unspecified: Secondary | ICD-10-CM

## 2018-09-19 ENCOUNTER — Other Ambulatory Visit: Payer: Self-pay | Admitting: Internal Medicine

## 2018-09-19 DIAGNOSIS — I10 Essential (primary) hypertension: Secondary | ICD-10-CM

## 2018-10-03 NOTE — Progress Notes (Signed)
HPI The patient presents for follow up of his CAD.  Since I last saw him he has had some chest discomfort.  This is sporadic.  It is under his left breast and into the left axilla slightly.  He had some brief.  He is not sure whether it similar to his previous angina.  He cannot necessarily bring this on.  He still goes to the gym and does some light exercises.  He does not describe radiation to his jaw or to his arms.  He has no associated shortness of breath, nausea or vomiting.  He is not describing new palpitations, presyncope or syncope.  He is had no weight gain or edema.   No Known Allergies  Current Outpatient Medications  Medication Sig Dispense Refill  . aspirin 81 MG EC tablet Take 81 mg by mouth daily.      Marland Kitchen atenolol (TENORMIN) 50 MG tablet TAKE 1 TABLET BY MOUTH TWICE A DAY 180 tablet 1  . atorvastatin (LIPITOR) 20 MG tablet TAKE 1 TABLET BY MOUTH EVERY DAY 90 tablet 1  . Elastic Bandages & Supports (WRIST SPLINT/COCK-UP/RIGHT L) MISC 1 device to use daily 1 each 0  . fish oil-omega-3 fatty acids 1000 MG capsule Take 2 g by mouth daily.    . hydrochlorothiazide (HYDRODIURIL) 25 MG tablet TAKE 1 TABLET EVERY OTHER DAY 45 tablet 1  . KLOR-CON M10 10 MEQ tablet TAKE 1 TABLET BY MOUTH ON MONDAY, WEDNESDAY AND FRIDAY 40 tablet 2  . lisinopril (PRINIVIL,ZESTRIL) 20 MG tablet TAKE 1 TABLET BY MOUTH EVERY DAY 90 tablet 1  . nitroGLYCERIN (NITROSTAT) 0.4 MG SL tablet Place 1 tablet (0.4 mg total) under the tongue every 5 (five) minutes as needed. 25 tablet 1  . pantoprazole (PROTONIX) 40 MG tablet TAKE 1 TABLET BY MOUTH EVERY DAY 90 tablet 1  . primidone (MYSOLINE) 50 MG tablet TAKE 1 TABLET BY MOUTH TWICE A DAY 180 tablet 0  . sildenafil (VIAGRA) 100 MG tablet Take 1 tablet (100 mg total) by mouth as needed for erectile dysfunction. 6 tablet 11  . verapamil (CALAN-SR) 180 MG CR tablet TAKE 1 TABLET BY MOUTH EVERY DAY 90 tablet 1   No current facility-administered medications for  this visit.     Past Medical History:  Diagnosis Date  . Anemia   . Barrett's esophagus   . Blood transfusion without reported diagnosis   . CAD (coronary artery disease)    2002 stenting of the RCA,, 2006 Stenting of diag with DES, PTCA of Circ, 50% LAD  . Diverticulosis   . DJD (degenerative joint disease)   . Duodenal ulcer   . ED (erectile dysfunction)   . GERD (gastroesophageal reflux disease)   . Hemorrhoids   . Hyperlipidemia   . Hypertension   . Myocardial infarction [facility])     Past Surgical History:  Procedure Laterality Date  . COLONOSCOPY    . ELBOW ARTHROSCOPY    . SPINAL FUSION    . SPINAL FUSION    . SPINE SURGERY     cervical  . UPPER GASTROINTESTINAL ENDOSCOPY      ROS   Positive for cold feet.  Otherwise as stated in the HPI and negative for all other systems.  PHYSICAL EXAM BP (!) 152/90   Pulse (!) 50   Ht 5\' 6"  (1.676 m)   Wt 179 lb 8 oz (81.4 kg)   BMI 28.97 kg/m   GENERAL:  Well appearing NECK:  No jugular  venous distention, waveform within normal limits, carotid upstroke brisk and symmetric, no bruits, no thyromegaly LUNGS:  Clear to auscultation bilaterally CHEST:  Unremarkable HEART:  PMI not displaced or sustained,S1 and S2 within normal limits, no S3, no S4, no clicks, no rubs, no murmurs ABD:  Flat, positive bowel sounds normal in frequency in pitch, no bruits, no rebound, no guarding, no midline pulsatile mass, no hepatomegaly, no splenomegaly EXT:  2 plus pulses throughout, no edema, no cyanosis no clubbing      Lab Results  Component Value Date   CHOL 139 03/08/2018   TRIG 149.0 03/08/2018   HDL 50.40 03/08/2018   LDLCALC 59 03/08/2018   Lab Results  Component Value Date   CREATININE 1.46 03/08/2018    EKG: Sinus bradycardia, rate 50, axis within normal limits, intervals within normal limits, no acute ST-T wave changes.  ASSESSMENT AND PLAN  CORONARY ARTERY DISEASE, S/P PTCA -  The patient does have some chest  discomfort.  Somewhat atypical.  I think the pretest probability of obstructive coronary disease is relatively low. I will bring the patient back for a POET (Plain Old Exercise Test). This will allow me to screen for obstructive coronary disease, risk stratify and very importantly provide a prescription for exercise.  HYPERLIPIDEMIA -  His LDL was 59 last year.  No change in therapy.   HYPERTENSION -  The blood pressure is mildly elevated.  However, at home it is well controlled and we did review his blood pressure readings.   CKD:   His creat was stable as above last year.  No change in therapy.

## 2018-10-04 ENCOUNTER — Other Ambulatory Visit: Payer: Self-pay | Admitting: Neurology

## 2018-10-04 ENCOUNTER — Encounter: Payer: Self-pay | Admitting: Cardiology

## 2018-10-04 ENCOUNTER — Ambulatory Visit: Payer: PPO | Admitting: Cardiology

## 2018-10-04 VITALS — BP 152/90 | HR 50 | Ht 66.0 in | Wt 179.5 lb

## 2018-10-04 DIAGNOSIS — R079 Chest pain, unspecified: Secondary | ICD-10-CM | POA: Diagnosis not present

## 2018-10-04 DIAGNOSIS — I10 Essential (primary) hypertension: Secondary | ICD-10-CM | POA: Diagnosis not present

## 2018-10-04 DIAGNOSIS — E785 Hyperlipidemia, unspecified: Secondary | ICD-10-CM | POA: Diagnosis not present

## 2018-10-04 DIAGNOSIS — I25119 Atherosclerotic heart disease of native coronary artery with unspecified angina pectoris: Secondary | ICD-10-CM

## 2018-10-04 NOTE — Patient Instructions (Signed)
Medication Instructions:  Continue current medications  If you need a refill on your cardiac medications before your next appointment, please call your pharmacy.  Labwork: None Ordered   Take the provided lab slips with you to the lab for your blood draw.   When you have your labs (blood work) drawn today and your tests are completely normal, you will receive your results only by MyChart Message (if you have MyChart) -OR-  A paper copy in the mail.  If you have any lab test that is abnormal or we need to change your treatment, we will call you to review these results.  Testing/Procedures: Your physician has requested that you have an exercise tolerance test. For further information please visit [email or web address]. Please also follow instruction sheet, as given.  Follow-Up: You will need a follow up appointment in 1 Year.  Please call our office 2 months in advance to schedule this appointment.  You may see Dr [name] or one of the following Advanced Practice Providers on your designated Care Team:   Rosaria Ferries, PA-C . Jory Sims, DNP, ANP   At [facility], you and your health needs are our priority.  As part of our continuing mission to provide you with exceptional heart care, we have created designated Provider Care Teams.  These Care Teams include your primary Cardiologist (physician) and Advanced Practice Providers (APPs -  Physician Assistants and Nurse Practitioners) who all work together to provide you with the care you need, when you need it.  Thank you for choosing [facility]!!

## 2018-10-07 ENCOUNTER — Telehealth (HOSPITAL_COMMUNITY): Payer: Self-pay

## 2018-10-07 NOTE — Telephone Encounter (Signed)
Encounter complete. 

## 2018-10-08 ENCOUNTER — Ambulatory Visit: Payer: PPO | Admitting: Neurology

## 2018-10-08 ENCOUNTER — Telehealth (HOSPITAL_COMMUNITY): Payer: Self-pay

## 2018-10-08 NOTE — Telephone Encounter (Signed)
Encounter complete. 

## 2018-10-12 ENCOUNTER — Ambulatory Visit (HOSPITAL_COMMUNITY)
Admission: RE | Admit: 2018-10-12 | Discharge: 2018-10-12 | Disposition: A | Discharge status: Home / Self Care | Payer: PPO | Source: Ambulatory Visit | Attending: Cardiovascular Disease | Admitting: Cardiovascular Disease

## 2018-10-12 DIAGNOSIS — R079 Chest pain, unspecified: Secondary | ICD-10-CM | POA: Diagnosis not present

## 2018-10-12 NOTE — Progress Notes (Signed)
Subjective:   [name] was seen in consultation in the movement disorder clinic at the request of [name], MD.  The evaluation is for tremor.  Tremor started approximately 25 year ago but has been worse over the last year.  It involves the the L and R hand but the L is worse than the R.  He is R hand dominant.  Tremor is most noticeable when he picks up a cup of coffee and he will spill it all over.   There is no known family hx of tremor.    Affected by caffeine:  Thinks it may (1 cup coffee/day and starting to drink decaf) Affected by alcohol:  Doesn't drink EtOH Affected by stress:  Yes.   Affected by fatigue:  No. Spills soup if on spoon:  No. but he eats with R hand and L is worse Spills glass of liquid if full:  Yes.   Affects ADL's (tying shoes, brushing teeth, etc):  No., but may affect buttoning of cuff buttons  Current/Previously tried tremor medications: none (on atenolol x 25 years but for BP)  Current medications that may exacerbate tremor:  n/a  Neuroimaging of brain has never been performed.  10/06/16 update:  Patient follows up today.  Last visit, I started the patient on primidone for his tremor.  He states that he is doing better.  No SE with the medication.  Not spilling foods.  No new med problems.   04/07/17 update:  Pt seen in f/u for ET.  He is on primidone 50 mg bid, which was increased last visit.  States that he is doing really well and not having any side effects.  The records that were made available to me were reviewed.  Had his annual PE in July.  "I have a lot of energy."  Goes to the gym 3 days a week.    10/08/17 update: Patient is seen today in follow-up for essential tremor.  He is on primidone, 50 mg twice per day.  He is very happy with efficacy.  He can now "carry a cup of coffee in one hand" and tea for his wife in the other hand and nothing spills.  "I'm thankful that I feel super."  Does tell me that tip of thumb, pointer and middle are  numb.  Sometimes, it will prevent him from buttoning clothes.  Records have been reviewed since last visit.  He saw Dr. [name] on October 01, 2017.  No changes in medications from that visit.  10/14/18 update: Patient is seen today in follow-up for essential tremor.  I have not seen him in a year.  He is still on primidone, 50 mg twice per day.  He thinks medication has been effective.  "I think that it works great."   Denies side effects.  Other records have been reviewed since last visit.  He was in the ER last March for cutting his hand with scissors.  Has seen cardiology recently.  Those records are reviewed.  No Known Allergies  Outpatient Encounter Medications as of 10/14/2018  Medication Sig  . aspirin 81 MG EC tablet Take 81 mg by mouth daily.    Marland Kitchen atenolol (TENORMIN) 50 MG tablet TAKE 1 TABLET BY MOUTH TWICE A DAY  . atorvastatin (LIPITOR) 20 MG tablet TAKE 1 TABLET BY MOUTH EVERY DAY  . fish oil-omega-3 fatty acids 1000 MG capsule Take 2 g by mouth daily.  . hydrochlorothiazide (HYDRODIURIL) 25 MG tablet TAKE 1  TABLET EVERY OTHER DAY (Patient taking differently: Take 25 mg by mouth. Every M-W-F)  . KLOR-CON M10 10 MEQ tablet TAKE 1 TABLET BY MOUTH ON MONDAY, WEDNESDAY AND FRIDAY  . lisinopril (PRINIVIL,ZESTRIL) 20 MG tablet TAKE 1 TABLET BY MOUTH EVERY DAY  . nitroGLYCERIN (NITROSTAT) 0.4 MG SL tablet Place 1 tablet (0.4 mg total) under the tongue every 5 (five) minutes as needed.  . pantoprazole (PROTONIX) 40 MG tablet TAKE 1 TABLET BY MOUTH EVERY DAY  . primidone (MYSOLINE) 50 MG tablet TAKE 1 TABLET BY MOUTH TWICE A DAY  . sildenafil (VIAGRA) 100 MG tablet Take 1 tablet (100 mg total) by mouth as needed for erectile dysfunction.  . verapamil (CALAN-SR) 180 MG CR tablet TAKE 1 TABLET BY MOUTH EVERY DAY  . [DISCONTINUED] Elastic Bandages & Supports (WRIST SPLINT/COCK-UP/RIGHT L) MISC 1 device to use daily   No facility-administered encounter medications on file as of 10/14/2018.      Past Medical History:  Diagnosis Date  . Anemia   . Barrett's esophagus   . Blood transfusion without reported diagnosis   . CAD (coronary artery disease)    2002 stenting of the RCA,, 2006 Stenting of diag with DES, PTCA of Circ, 50% LAD  . Diverticulosis   . DJD (degenerative joint disease)   . Duodenal ulcer   . ED (erectile dysfunction)   . GERD (gastroesophageal reflux disease)   . Hemorrhoids   . Hyperlipidemia   . Hypertension   . Myocardial infarction [facility])     Past Surgical History:  Procedure Laterality Date  . COLONOSCOPY    . ELBOW ARTHROSCOPY    . SPINAL FUSION    . SPINAL FUSION    . SPINE SURGERY     cervical  . UPPER GASTROINTESTINAL ENDOSCOPY      Social History   Socioeconomic History  . Marital status: Married    Spouse name: Not on file  . Number of children: Not on file  . Years of education: Not on file  . Highest education level: Not on file  Occupational History  . 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 Financial resource strain: Not on file  . Food insecurity:    Worry: Not on file    Inability: Not on file  . Transportation needs:    Medical: Not on file    Non-medical: Not on file  Tobacco Use  . Smoking status: Former Smoker    Last attempt to quit: 08/25/1961    Years since quitting: 57.1  . Smokeless tobacco: Never Used  Substance and Sexual Activity  . Alcohol use: No    Comment: rarely  . Drug use: No  . Sexual activity: Not on file  Lifestyle  . Physical activity:    Days per week: Not on file    Minutes per session: Not on file  . Stress: Not on file  Relationships  . Social connections:    Talks on phone: Not on file    Gets together: Not on file    Attends religious service: Not on file    Active member of club or organization: Not on file    Attends meetings of clubs or organizations: Not on file    Relationship status: Not on file  . Intimate partner violence:    Fear of current or  ex partner: Not on file    Emotionally abused: Not on file    Physically abused: Not on file  Forced sexual activity: Not on file  Other Topics Concern  . Not on file  Social History Narrative  . Not on file    Family Status  Relation Name Status  . Unknown  (Not Specified)  . Unknown  (Not Specified)  . Unknown  (Not Specified)  . Mother  Deceased  . Father  Deceased  . Daughter  Alive  . Son adopted [employer]  . Daughter adopted Alive  . Neg Hx  (Not Specified)    Review of Systems A complete 10 system ROS was obtained and was negative apart from what is mentioned.   Objective:   VITALS:   Vitals:   10/14/18 0910  BP: 122/82  Pulse: 62  SpO2: 96%  Weight: 182 lb (82.6 kg)  Height: 5\' 6"  (1.676 m)   Gen:  Appears stated age and in NAD. HEENT:  Normocephalic, atraumatic. The mucous membranes are moist. The superficial temporal arteries are without ropiness or tenderness. Cardiovascular:  Bradycardic with regular rhythm. Lungs: Clear to auscultation bilaterally. Neck: There are no carotid bruits noted bilaterally. MS:  No wasting of mm including of the APB bilterally  NEUROLOGICAL:  Orientation:  The patient is alert and oriented x 3.   Cranial nerves: There is good facial symmetry.  Extraocular muscles are intact and visual fields are full to confrontational testing. Speech is fluent and clear. Soft palate rises symmetrically and there is no tongue deviation. Hearing is intact to conversational tone. Tone: Tone is good throughout.  No rigidity Sensation: Sensation is 5/5 x 4.   Coordination:  The patient has no dysdiadichokinesia or dysmetria. Motor: Strength is 5/5 in the UE/LE.  Grip strength is good and equal bilaterally Gait and Station: The patient is able to ambulate without difficulty.   MOVEMENT EXAM: Tremor:  There is mild postural and intention tremor on the L.  Mild trouble with archimedes spirals on the L.     Labs:  Lab Results  Component Value  Date   TSH 1.03 03/08/2018     Chemistry      Component Value Date/Time   NA 140 03/08/2018 0926   K 5.1 03/08/2018 0926   CL 104 03/08/2018 0926   CO2 29 03/08/2018 0926   BUN 20 03/08/2018 0926   CREATININE 1.46 03/08/2018 0926      Component Value Date/Time   CALCIUM 9.9 03/08/2018 0926   ALKPHOS 63 03/08/2018 0926   AST 21 03/08/2018 0926   ALT 18 03/08/2018 0926   BILITOT 0.4 03/08/2018 0926     Lab Results  Component Value Date   VITAMINB12 332 09/22/2016     Assessment/Plan:   1.  Essential Tremor.  -continue 50 mg bid.  RX writtent today.  Risks, benefits, side effects and alternative therapies were discussed.  The opportunity to ask questions was given and they were answered to the best of my ability.  The patient expressed understanding and willingness to follow the outlined treatment protocols.  2.  F/u 1 year    CC:  [name], MD

## 2018-10-13 LAB — EXERCISE TOLERANCE TEST
CSEPED: 6 min
Estimated workload: 7.6 METS
Exercise duration (sec): 19 s
MPHR: 140 {beats}/min
Peak HR: 94 {beats}/min
Percent HR: 67 %
RPE: 17
Rest HR: 57 {beats}/min

## 2018-10-14 ENCOUNTER — Encounter: Payer: Self-pay | Admitting: Neurology

## 2018-10-14 ENCOUNTER — Ambulatory Visit: Payer: PPO | Admitting: Neurology

## 2018-10-14 VITALS — BP 122/82 | HR 62 | Ht 66.0 in | Wt 182.0 lb

## 2018-10-14 DIAGNOSIS — G250 Essential tremor: Secondary | ICD-10-CM | POA: Diagnosis not present

## 2018-10-14 MED ORDER — PRIMIDONE 50 MG PO TABS: 50.0000 mg | ORAL_TABLET | Freq: Two times a day (BID) | ORAL | 3 refills | Status: DC

## 2018-10-22 ENCOUNTER — Encounter: Payer: Self-pay | Admitting: Family

## 2018-10-22 ENCOUNTER — Ambulatory Visit (INDEPENDENT_AMBULATORY_CARE_PROVIDER_SITE_OTHER): Payer: PPO | Admitting: Family

## 2018-10-22 VITALS — BP 110/72 | HR 55 | Temp 97.7°F | Ht 66.0 in | Wt 180.0 lb

## 2018-10-22 DIAGNOSIS — L259 Unspecified contact dermatitis, unspecified cause: Secondary | ICD-10-CM | POA: Diagnosis not present

## 2018-10-22 DIAGNOSIS — R21 Rash and other nonspecific skin eruption: Secondary | ICD-10-CM | POA: Diagnosis not present

## 2018-10-22 MED ORDER — CETIRIZINE HCL 10 MG PO TABS: 10.0000 mg | ORAL_TABLET | Freq: Every day | ORAL | 0 refills | Status: DC

## 2018-10-22 MED ORDER — METHYLPREDNISOLONE ACETATE 40 MG/ML IJ SUSP
40.0000 mg | Freq: Once | INTRAMUSCULAR | Status: AC
  Administered 2018-10-22: 40 mg via INTRAMUSCULAR

## 2018-10-22 MED ORDER — FAMOTIDINE 20 MG PO TABS: 20.0000 mg | ORAL_TABLET | Freq: Two times a day (BID) | ORAL | 0 refills | Status: DC

## 2018-10-22 NOTE — Progress Notes (Signed)
[name] is a 78 y.o. male with the following history as recorded in EpicCare:  Patient Active Problem List   Diagnosis Date Noted  . Neck pain, bilateral posterior 03/08/2018  . Coarse tremors 06/10/2016  . Routine general medical examination at a health care facility 02/07/2016  . CAD (coronary artery disease) 05/01/2015  . CKD (chronic kidney disease), stage III ([place]) 05/01/2015  . 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 BARRETTS ESOPHAGUS 01/24/2010  . 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 Hyperlipidemia with target LDL less than 70 09/21/2007  . Chronic ischemic heart disease 09/21/2007  . ERECTILE DYSFUNCTION 03/09/2007  . Essential hypertension 03/09/2007    Current Outpatient Medications  Medication Sig Dispense Refill  . aspirin 81 MG EC tablet Take 81 mg by mouth daily.      Marland Kitchen atenolol (TENORMIN) 50 MG tablet TAKE 1 TABLET BY MOUTH TWICE A DAY 180 tablet 1  . atorvastatin (LIPITOR) 20 MG tablet TAKE 1 TABLET BY MOUTH EVERY DAY 90 tablet 1  . fish oil-omega-3 fatty acids 1000 MG capsule Take 2 g by mouth daily.    . hydrochlorothiazide (HYDRODIURIL) 25 MG tablet TAKE 1 TABLET EVERY OTHER DAY (Patient taking differently: Take 25 mg by mouth. Every M-W-F) 45 tablet 1  . KLOR-CON M10 10 MEQ tablet TAKE 1 TABLET BY MOUTH ON MONDAY, WEDNESDAY AND FRIDAY 40 tablet 2  . lisinopril (PRINIVIL,ZESTRIL) 20 MG tablet TAKE 1 TABLET BY MOUTH EVERY DAY 90 tablet 1  . nitroGLYCERIN (NITROSTAT) 0.4 MG SL tablet Place 1 tablet (0.4 mg total) under the tongue every 5 (five) minutes as needed. 25 tablet 1  . pantoprazole (PROTONIX) 40 MG tablet TAKE 1 TABLET BY MOUTH EVERY DAY 90 tablet 1  . primidone (MYSOLINE) 50 MG tablet Take 1 tablet (50 mg total) by mouth 2 (two) times daily. 180 tablet 3  . sildenafil (VIAGRA) 100 MG tablet Take 1 tablet (100 mg total) by mouth as needed for erectile dysfunction. 6 tablet 11  . verapamil (CALAN-SR) 180 MG CR tablet  TAKE 1 TABLET BY MOUTH EVERY DAY 90 tablet 1  . cetirizine (ZYRTEC) 10 MG tablet Take 1 tablet (10 mg total) by mouth at bedtime. 30 tablet 0  . famotidine (PEPCID) 20 MG tablet Take 1 tablet (20 mg total) by mouth 2 (two) times daily. 30 tablet 0   No current facility-administered medications for this visit.     Allergies: Patient has no known allergies.  Past Medical History:  Diagnosis Date  . Anemia   . Barrett's esophagus   . Blood transfusion without reported diagnosis   . CAD (coronary artery disease)    2002 stenting of the RCA,, 2006 Stenting of diag with DES, PTCA of Circ, 50% LAD  . Diverticulosis   . DJD (degenerative joint disease)   . Duodenal ulcer   . ED (erectile dysfunction)   . GERD (gastroesophageal reflux disease)   . Hemorrhoids   . Hyperlipidemia   . Hypertension   . Myocardial infarction [facility])     Past Surgical History:  Procedure Laterality Date  . COLONOSCOPY    . ELBOW ARTHROSCOPY    . SPINAL FUSION    . SPINAL FUSION    . SPINE SURGERY     cervical  . UPPER GASTROINTESTINAL ENDOSCOPY      Family History  Problem Relation Age of Onset  . Alcohol abuse Unknown   . Coronary artery disease Unknown   . Alzheimer's disease Unknown   .  Dementia Mother   . Colon cancer Neg Hx   . Esophageal cancer Neg Hx   . Rectal cancer Neg Hx   . Stomach cancer Neg Hx     Social History   Tobacco Use  . Smoking status: Former Smoker    Last attempt to quit: 08/25/1961    Years since quitting: 57.1  . Smokeless tobacco: Never Used  Substance Use Topics  . Alcohol use: No    Comment: rarely    Subjective:  Patient presents with concerns for rash on hands/ forearms and top of head; symptoms x 3 days; denies any new soaps, foods, detergents or medications. Not taking any type of antihistamines. No fever;      Objective:  Vitals:   10/22/18 1501  BP: 110/72  Pulse: (!) 55  Temp: 97.7 F (36.5 C)  TempSrc: Oral  SpO2: 97%  Weight: 180 lb 0.6 oz  (81.7 kg)  Height: 5\' 6"  (1.676 m)    General: Well developed, well nourished, in no acute distress  Skin : Warm and dry. Suspect contact dermatitis on top of both hands and forearms; localized area of redness noted on top of scalp Head: Normocephalic and atraumatic  Eyes: Sclera and conjunctiva clear; pupils round and reactive to light; extraocular movements intact  Ears: External normal; canals clear; tympanic membranes normal  Oropharynx: Pink, supple. No suspicious lesions  Neck: Supple without thyromegaly, adenopathy  Lungs: Respirations unlabored;  Neurologic: Alert and oriented; speech intact; face symmetrical; moves all extremities well; CNII-XII intact without focal deficit   Assessment:  1. Rash   2. Contact dermatitis, unspecified contact dermatitis type, unspecified trigger     Plan:  Depo-Medrol IM 40 mg given in office; recommend Zyrtec 10 mg qhs and Pepcid 20 mg bid for antihistamine benefit; follow-up worse, no better.   Return for CPE with Dr. Ronnald Ramp late July 2020.  No orders of the defined types were placed in this encounter.   Requested Prescriptions   Signed Prescriptions Disp Refills  . cetirizine (ZYRTEC) 10 MG tablet 30 tablet 0    Sig: Take 1 tablet (10 mg total) by mouth at bedtime.  . famotidine (PEPCID) 20 MG tablet 30 tablet 0    Sig: Take 1 tablet (20 mg total) by mouth 2 (two) times daily.

## 2018-11-04 DIAGNOSIS — H353112 Nonexudative age-related macular degeneration, right eye, intermediate dry stage: Secondary | ICD-10-CM | POA: Diagnosis not present

## 2018-11-13 ENCOUNTER — Other Ambulatory Visit: Payer: Self-pay | Admitting: Family

## 2019-02-13 ENCOUNTER — Other Ambulatory Visit: Payer: Self-pay | Admitting: Internal Medicine

## 2019-02-13 DIAGNOSIS — E785 Hyperlipidemia, unspecified: Secondary | ICD-10-CM

## 2019-02-13 DIAGNOSIS — K2270 Barrett's esophagus without dysplasia: Secondary | ICD-10-CM

## 2019-02-13 DIAGNOSIS — K219 Gastro-esophageal reflux disease without esophagitis: Secondary | ICD-10-CM

## 2019-02-13 DIAGNOSIS — I2510 Atherosclerotic heart disease of native coronary artery without angina pectoris: Secondary | ICD-10-CM

## 2019-02-13 DIAGNOSIS — I10 Essential (primary) hypertension: Secondary | ICD-10-CM

## 2019-03-04 DIAGNOSIS — R351 Nocturia: Secondary | ICD-10-CM | POA: Diagnosis not present

## 2019-03-04 DIAGNOSIS — N5201 Erectile dysfunction due to arterial insufficiency: Secondary | ICD-10-CM | POA: Diagnosis not present

## 2019-03-04 DIAGNOSIS — N401 Enlarged prostate with lower urinary tract symptoms: Secondary | ICD-10-CM | POA: Diagnosis not present

## 2019-03-04 DIAGNOSIS — R3121 Asymptomatic microscopic hematuria: Secondary | ICD-10-CM | POA: Diagnosis not present

## 2019-03-10 ENCOUNTER — Encounter: Payer: Self-pay | Admitting: Internal Medicine

## 2019-03-10 ENCOUNTER — Other Ambulatory Visit: Payer: Self-pay

## 2019-03-10 ENCOUNTER — Ambulatory Visit (INDEPENDENT_AMBULATORY_CARE_PROVIDER_SITE_OTHER): Payer: PPO | Admitting: Internal Medicine

## 2019-03-10 ENCOUNTER — Other Ambulatory Visit (INDEPENDENT_AMBULATORY_CARE_PROVIDER_SITE_OTHER): Payer: PPO

## 2019-03-10 VITALS — BP 142/80 | HR 60 | Temp 98.0°F | Resp 16 | Ht 66.0 in | Wt 170.8 lb

## 2019-03-10 DIAGNOSIS — N183 Chronic kidney disease, stage 3 unspecified: Secondary | ICD-10-CM

## 2019-03-10 DIAGNOSIS — I2510 Atherosclerotic heart disease of native coronary artery without angina pectoris: Secondary | ICD-10-CM

## 2019-03-10 DIAGNOSIS — K2270 Barrett's esophagus without dysplasia: Secondary | ICD-10-CM

## 2019-03-10 DIAGNOSIS — K219 Gastro-esophageal reflux disease without esophagitis: Secondary | ICD-10-CM

## 2019-03-10 DIAGNOSIS — I10 Essential (primary) hypertension: Secondary | ICD-10-CM

## 2019-03-10 DIAGNOSIS — E785 Hyperlipidemia, unspecified: Secondary | ICD-10-CM

## 2019-03-10 LAB — CBC WITH DIFFERENTIAL/PLATELET
Basophils Absolute: 0.1 10*3/uL (ref 0.0–0.1)
Basophils Relative: 0.7 % (ref 0.0–3.0)
Eosinophils Absolute: 0.2 10*3/uL (ref 0.0–0.7)
Eosinophils Relative: 2.5 % (ref 0.0–5.0)
HCT: 42.2 % (ref 39.0–52.0)
Hemoglobin: 14.8 g/dL (ref 13.0–17.0)
Lymphocytes Relative: 37.7 % (ref 12.0–46.0)
Lymphs Abs: 3 10*3/uL (ref 0.7–4.0)
MCHC: 35 g/dL (ref 30.0–36.0)
MCV: 94.5 fl (ref 78.0–100.0)
Monocytes Absolute: 0.7 10*3/uL (ref 0.1–1.0)
Monocytes Relative: 9.2 % (ref 3.0–12.0)
Neutro Abs: 4 10*3/uL (ref 1.4–7.7)
Neutrophils Relative %: 49.9 % (ref 43.0–77.0)
Platelets: 207 10*3/uL (ref 150.0–400.0)
RBC: 4.47 Mil/uL (ref 4.22–5.81)
RDW: 13.1 % (ref 11.5–15.5)
WBC: 8 10*3/uL (ref 4.0–10.5)

## 2019-03-10 LAB — BASIC METABOLIC PANEL
BUN: 21 mg/dL (ref 6–23)
CO2: 30 mEq/L (ref 19–32)
Calcium: 9.7 mg/dL (ref 8.4–10.5)
Chloride: 102 mEq/L (ref 96–112)
Creatinine, Ser: 1.19 mg/dL (ref 0.40–1.50)
GFR: 58.75 mL/min — ABNORMAL LOW (ref >60.00)
Glucose, Bld: 90 mg/dL (ref 70–99)
Potassium: 4.7 mEq/L (ref 3.5–5.1)
Sodium: 138 mEq/L (ref 135–145)

## 2019-03-10 LAB — URINALYSIS, ROUTINE W REFLEX MICROSCOPIC
Bilirubin Urine: NEGATIVE
Ketones, ur: NEGATIVE
Leukocytes,Ua: NEGATIVE
Nitrite: NEGATIVE
Specific Gravity, Urine: <=1.005 — AB (ref 1.000–1.030)
Total Protein, Urine: NEGATIVE
Urine Glucose: NEGATIVE
Urobilinogen, UA: 0.2 (ref 0.0–1.0)
pH: 7 (ref 5.0–8.0)

## 2019-03-10 LAB — LIPID PANEL
Cholesterol: 126 mg/dL (ref 0–200)
HDL: 42.4 mg/dL (ref >39.00)
LDL Cholesterol: 64 mg/dL (ref 0–99)
NonHDL: 84
Total CHOL/HDL Ratio: 3
Triglycerides: 101 mg/dL (ref 0.0–149.0)
VLDL: 20.2 mg/dL (ref 0.0–40.0)

## 2019-03-10 LAB — HEPATIC FUNCTION PANEL
ALT: 16 U/L (ref 0–53)
AST: 18 U/L (ref 0–37)
Albumin: 4.4 g/dL (ref 3.5–5.2)
Alkaline Phosphatase: 68 U/L (ref 39–117)
Bilirubin, Direct: 0.1 mg/dL (ref 0.0–0.3)
Total Bilirubin: 0.6 mg/dL (ref 0.2–1.2)
Total Protein: 7.1 g/dL (ref 6.0–8.3)

## 2019-03-10 LAB — TSH: TSH: 0.83 u[IU]/mL (ref 0.35–4.50)

## 2019-03-10 MED ORDER — VERAPAMIL HCL ER 180 MG PO TBCR: 180.0000 mg | EXTENDED_RELEASE_TABLET | Freq: Every day | ORAL | 1 refills | Status: DC

## 2019-03-10 MED ORDER — PANTOPRAZOLE SODIUM 40 MG PO TBEC: 40.0000 mg | DELAYED_RELEASE_TABLET | Freq: Every day | ORAL | 1 refills | Status: DC

## 2019-03-10 MED ORDER — NITROGLYCERIN 0.4 MG SL SUBL: 0.4000 mg | SUBLINGUAL_TABLET | SUBLINGUAL | 1 refills | Status: DC | PRN

## 2019-03-10 MED ORDER — LISINOPRIL 20 MG PO TABS: 20.0000 mg | ORAL_TABLET | Freq: Every day | ORAL | 1 refills | Status: DC

## 2019-03-10 MED ORDER — ATORVASTATIN CALCIUM 20 MG PO TABS: 20.0000 mg | ORAL_TABLET | Freq: Every day | ORAL | 1 refills | Status: DC

## 2019-03-10 MED ORDER — ATENOLOL 50 MG PO TABS: 50.0000 mg | ORAL_TABLET | Freq: Two times a day (BID) | ORAL | 1 refills | Status: DC

## 2019-03-10 MED ORDER — HYDROCHLOROTHIAZIDE 25 MG PO TABS: 25.0000 mg | ORAL_TABLET | ORAL | 1 refills | Status: DC

## 2019-03-10 NOTE — Patient Instructions (Signed)

## 2019-03-10 NOTE — Progress Notes (Signed)
Subjective:  Patient ID: [name], male    DOB: Aug 10, 1941  Age: 78 y.o. MRN: [id number]  CC: Coronary Artery Disease, Hypertension, and Hyperlipidemia   HPI [name] presents for f/up - He walks 20 minutes a day and denies any recent episodes of CP, DOE, diaphoresis, palpitations, edema, or fatigue.  He has intentionally lost weight with lifestyle modifications.  Outpatient Medications Prior to Visit  Medication Sig Dispense Refill  . aspirin 81 MG EC tablet Take 81 mg by mouth daily.      . primidone (MYSOLINE) 50 MG tablet Take 1 tablet (50 mg total) by mouth 2 (two) times daily. 180 tablet 3  . atenolol (TENORMIN) 50 MG tablet TAKE 1 TABLET BY MOUTH TWICE A DAY 180 tablet 1  . atorvastatin (LIPITOR) 20 MG tablet TAKE 1 TABLET BY MOUTH EVERY DAY 90 tablet 1  . cetirizine (ZYRTEC) 10 MG tablet Take 1 tablet (10 mg total) by mouth at bedtime. 30 tablet 0  . famotidine (PEPCID) 20 MG tablet Take 1 tablet (20 mg total) by mouth 2 (two) times daily. 30 tablet 0  . fish oil-omega-3 fatty acids 1000 MG capsule Take 2 g by mouth daily.    . hydrochlorothiazide (HYDRODIURIL) 25 MG tablet TAKE 1 TABLET EVERY OTHER DAY (Patient taking differently: Take 25 mg by mouth. Every M-W-F) 45 tablet 1  . KLOR-CON M10 10 MEQ tablet TAKE 1 TABLET BY MOUTH ON MONDAY, WEDNESDAY AND FRIDAY 40 tablet 2  . lisinopril (PRINIVIL,ZESTRIL) 20 MG tablet TAKE 1 TABLET BY MOUTH EVERY DAY 90 tablet 1  . nitroGLYCERIN (NITROSTAT) 0.4 MG SL tablet Place 1 tablet (0.4 mg total) under the tongue every 5 (five) minutes as needed. 25 tablet 1  . pantoprazole (PROTONIX) 40 MG tablet TAKE 1 TABLET BY MOUTH EVERY DAY 90 tablet 1  . sildenafil (VIAGRA) 100 MG tablet Take 1 tablet (100 mg total) by mouth as needed for erectile dysfunction. 6 tablet 11  . verapamil (CALAN-SR) 180 MG CR tablet TAKE 1 TABLET BY MOUTH EVERY DAY 90 tablet 1   No facility-administered medications prior to visit.     ROS Review of  Systems  Constitutional: Negative for diaphoresis, fatigue and unexpected weight change.  Eyes: Negative for visual disturbance.  Respiratory: Negative for cough, chest tightness, shortness of breath and wheezing.   Cardiovascular: Negative for chest pain, palpitations and leg swelling.  Gastrointestinal: Negative for abdominal pain, constipation, diarrhea, nausea and vomiting.  Genitourinary: Negative.  Negative for difficulty urinating and dysuria.  Musculoskeletal: Negative.  Negative for arthralgias and myalgias.  Skin: Negative.  Negative for color change.  Neurological: Negative.  Negative for dizziness, weakness and light-headedness.  Hematological: Negative for adenopathy. Does not bruise/bleed easily.  Psychiatric/Behavioral: Negative.     Objective:  BP (!) 142/80 (BP Location: Left Arm, Patient Position: Sitting, Cuff Size: Normal)   Pulse 60   Temp 98 F (36.7 C) (Oral)   Resp 16   Ht 5\' 6"  (1.676 m)   Wt 170 lb 12 oz (77.5 kg)   SpO2 96%   BMI 27.56 kg/m   BP Readings from Last 3 Encounters:  03/10/19 (!) 142/80  10/22/18 110/72  10/14/18 122/82    Wt Readings from Last 3 Encounters:  03/10/19 170 lb 12 oz (77.5 kg)  10/22/18 180 lb 0.6 oz (81.7 kg)  10/14/18 182 lb (82.6 kg)    Physical Exam Constitutional:      Appearance: He is not ill-appearing or diaphoretic.  HENT:     Nose: Nose normal.     Mouth/Throat:     Mouth: Mucous membranes are moist.  Eyes:     General: No scleral icterus.    Conjunctiva/sclera: Conjunctivae normal.  Neck:     Musculoskeletal: Normal range of motion. No neck rigidity or muscular tenderness.  Cardiovascular:     Rate and Rhythm: Normal rate and regular rhythm.     Pulses: Normal pulses.     Heart sounds: No murmur.  Pulmonary:     Effort: Pulmonary effort is normal.     Breath sounds: No stridor. No wheezing, rhonchi or rales.  Abdominal:     General: Abdomen is protuberant. Bowel sounds are normal. There is no  distension.     Palpations: There is no hepatomegaly or splenomegaly.     Tenderness: There is no abdominal tenderness.  Musculoskeletal: Normal range of motion.     Right lower leg: No edema.     Left lower leg: No edema.  Lymphadenopathy:     Cervical: No cervical adenopathy.  Skin:    General: Skin is warm.     Coloration: Skin is not pale.  Neurological:     General: No focal deficit present.     Mental Status: He is alert and oriented to person, place, and time. Mental status is at baseline.  Psychiatric:        Mood and Affect: Mood normal.        Behavior: Behavior normal.     Lab Results  Component Value Date   WBC 8.0 03/10/2019   HGB 14.8 03/10/2019   HCT 42.2 03/10/2019   PLT 207.0 03/10/2019   GLUCOSE 90 03/10/2019   CHOL 126 03/10/2019   TRIG 101.0 03/10/2019   HDL 42.40 03/10/2019   LDLCALC 64 03/10/2019   ALT 16 03/10/2019   AST 18 03/10/2019   NA 138 03/10/2019   K 4.7 03/10/2019   CL 102 03/10/2019   CREATININE 1.19 03/10/2019   BUN 21 03/10/2019   CO2 30 03/10/2019   TSH 0.83 03/10/2019   PSA 2.62 02/15/2018    No results found.  Assessment & Plan:   Jordin was seen today for coronary artery disease, hypertension and hyperlipidemia.  Diagnoses and all orders for this visit:  Coronary artery disease involving native coronary artery of native heart without angina pectoris- He has had no recent episodes of angina.  Will continue risk factor modifications and the current medications. -     Lipid panel; Future -     atenolol (TENORMIN) 50 MG tablet; Take 1 tablet (50 mg total) by mouth 2 (two) times daily. -     atorvastatin (LIPITOR) 20 MG tablet; Take 1 tablet (20 mg total) by mouth daily. -     lisinopril (ZESTRIL) 20 MG tablet; Take 1 tablet (20 mg total) by mouth daily. -     nitroGLYCERIN (NITROSTAT) 0.4 MG SL tablet; Place 1 tablet (0.4 mg total) under the tongue every 5 (five) minutes as needed.  Essential hypertension- His blood pressure  is adequately well controlled.  Will continue the current medications. -     CBC with Differential/Platelet; Future -     Basic metabolic panel; Future -     TSH; Future -     Urinalysis, Routine w reflex microscopic; Future -     atenolol (TENORMIN) 50 MG tablet; Take 1 tablet (50 mg total) by mouth 2 (two) times daily. -     hydrochlorothiazide (HYDRODIURIL)  25 MG tablet; Take 1 tablet (25 mg total) by mouth every other day. Every M-W-F -     lisinopril (ZESTRIL) 20 MG tablet; Take 1 tablet (20 mg total) by mouth daily. -     verapamil (CALAN-SR) 180 MG CR tablet; Take 1 tablet (180 mg total) by mouth daily.  CKD (chronic kidney disease), stage III ([place])- His renal function has improved slightly.  Will continue to maintain control of his blood pressure.  He agrees to avoid nephrotoxic agents. -     CBC with Differential/Platelet; Future -     Basic metabolic panel; Future  Hyperlipidemia with target LDL less than 70- He has achieved his LDL goal and is doing well on the statin. -     Lipid panel; Future -     Hepatic function panel; Future -     TSH; Future -     atorvastatin (LIPITOR) 20 MG tablet; Take 1 tablet (20 mg total) by mouth daily.  GERD without esophagitis- His symptoms are well controlled.  Will continue the PPI. -     pantoprazole (PROTONIX) 40 MG tablet; Take 1 tablet (40 mg total) by mouth daily.  Barrett's esophagus without dysplasia -     pantoprazole (PROTONIX) 40 MG tablet; Take 1 tablet (40 mg total) by mouth daily.   I have discontinued Jeannie Fend. Barwick's fish oil-omega-3 fatty acids, sildenafil, Klor-Con M10, cetirizine, and famotidine. I have also changed his atenolol, atorvastatin, hydrochlorothiazide, lisinopril, pantoprazole, and verapamil. Additionally, I am having him maintain his aspirin, primidone, and nitroGLYCERIN.  Meds ordered this encounter  Medications  . atenolol (TENORMIN) 50 MG tablet    Sig: Take 1 tablet (50 mg total) by mouth 2 (two) times  daily.    Dispense:  180 tablet    Refill:  1  . atorvastatin (LIPITOR) 20 MG tablet    Sig: Take 1 tablet (20 mg total) by mouth daily.    Dispense:  90 tablet    Refill:  1  . hydrochlorothiazide (HYDRODIURIL) 25 MG tablet    Sig: Take 1 tablet (25 mg total) by mouth every other day. Every M-W-F    Dispense:  45 tablet    Refill:  1  . lisinopril (ZESTRIL) 20 MG tablet    Sig: Take 1 tablet (20 mg total) by mouth daily.    Dispense:  90 tablet    Refill:  1  . nitroGLYCERIN (NITROSTAT) 0.4 MG SL tablet    Sig: Place 1 tablet (0.4 mg total) under the tongue every 5 (five) minutes as needed.    Dispense:  25 tablet    Refill:  1  . pantoprazole (PROTONIX) 40 MG tablet    Sig: Take 1 tablet (40 mg total) by mouth daily.    Dispense:  90 tablet    Refill:  1  . verapamil (CALAN-SR) 180 MG CR tablet    Sig: Take 1 tablet (180 mg total) by mouth daily.    Dispense:  90 tablet    Refill:  1     Follow-up: Return in about 6 months (around 09/10/2019).  Scarlette Calico, MD

## 2019-03-16 ENCOUNTER — Encounter: Payer: Self-pay | Admitting: Gastroenterology

## 2019-03-22 ENCOUNTER — Ambulatory Visit: Payer: PPO | Admitting: *Deleted

## 2019-03-22 ENCOUNTER — Other Ambulatory Visit: Payer: Self-pay

## 2019-03-22 VITALS — Ht 66.0 in | Wt 170.5 lb

## 2019-03-22 DIAGNOSIS — K2270 Barrett's esophagus without dysplasia: Secondary | ICD-10-CM

## 2019-03-22 NOTE — Progress Notes (Signed)
No egg or soy allergy known to patient  No issues with past sedation with any surgeries  or procedures, no intubation problems  No diet pills per patient No home 02 use per patient  No blood thinners per patient  Pt denies issues with constipation  No A fib or A flutter  EMMI video sent to pt's e mail   Pt verified name, DOB, address and insurance during PV today. Pt mailed instruction packet to included paper to complete and mail back to [facility] with addressed and stamped envelope, Emmi video, copy of consent form to read and not return, and instructions.PV completed over the phone. Pt encouraged to call with questions or issues   Pt is aware that care partner will wait in the car during procedure; if they feel like they will be too hot to wait in the car; they may wait in the lobby.  We want them to wear a mask (we do not have any that we can provide them), practice social distancing, and we will check their temperatures when they get here.  I did remind patient that their care partner needs to stay in the parking lot the entire time. Pt will wear mask into building.  

## 2019-03-28 ENCOUNTER — Telehealth: Payer: Self-pay | Admitting: Gastroenterology

## 2019-03-28 NOTE — Telephone Encounter (Signed)

## 2019-03-29 ENCOUNTER — Encounter: Payer: Self-pay | Admitting: Gastroenterology

## 2019-03-29 ENCOUNTER — Other Ambulatory Visit: Payer: Self-pay

## 2019-03-29 ENCOUNTER — Ambulatory Visit (AMBULATORY_SURGERY_CENTER): Payer: PPO | Admitting: Gastroenterology

## 2019-03-29 VITALS — BP 116/68 | HR 46 | Temp 98.4°F | Resp 18 | Ht 66.0 in | Wt 170.0 lb

## 2019-03-29 DIAGNOSIS — K2270 Barrett's esophagus without dysplasia: Secondary | ICD-10-CM | POA: Diagnosis not present

## 2019-03-29 DIAGNOSIS — K222 Esophageal obstruction: Secondary | ICD-10-CM | POA: Diagnosis not present

## 2019-03-29 MED ORDER — SODIUM CHLORIDE 0.9 % IV SOLN: 500.0000 mL | Freq: Once | INTRAVENOUS | Status: DC

## 2019-03-29 NOTE — Op Note (Signed)
[facility] Patient Name: [name] Procedure Date: 03/29/2019 3:09 PM MRN: [id number] Endoscopist: Ladene Artist , MD Age: 78 Referring MD:  Date of Birth: [birth year] Gender: Male Account #: [id number] Procedure:                Upper GI endoscopy Indications:              Surveillance for malignancy due to personal history                            of Barrett's esophagus Medicines:                Monitored Anesthesia Care Procedure:                Pre-Anesthesia Assessment:                           - Prior to the procedure, a History and Physical                            was performed, and patient medications and                            allergies were reviewed. The patient's tolerance of                            previous anesthesia was also reviewed. The risks                            and benefits of the procedure and the sedation                            options and risks were discussed with the patient.                            All questions were answered, and informed consent                            was obtained. Prior Anticoagulants: The patient has                            taken no previous anticoagulant or antiplatelet                            agents. ASA Grade Assessment: II - A patient with                            mild systemic disease. After reviewing the risks                            and benefits, the patient was deemed in                            satisfactory condition to undergo the procedure.  After obtaining informed consent, the endoscope was                            passed under direct vision. Throughout the                            procedure, the patient's blood pressure, pulse, and                            oxygen saturations were monitored continuously. The                            Endoscope was introduced through the mouth, and                            advanced to the second part of  duodenum. The upper                            GI endoscopy was accomplished without difficulty.                            The patient tolerated the procedure well. Scope In: Scope Out: Findings:                 There were esophageal mucosal changes secondary to                            established long-segment Barrett's disease present                            in the distal esophagus. The maximum longitudinal                            extent of these mucosal changes was 6 cm in length.                            Mucosa was biopsied with a cold forceps for                            histology in 4 quadrants at intervals of 1.5 cm in                            the lower third of the esophagus. One specimen                            bottle was sent to pathology.                           The exam of the esophagus was otherwise normal.                           A medium-sized hiatal hernia was present.  A few localized, medium non-bleeding erosions were                            found in the gastric fundus. There were no stigmata                            of recent bleeding.                           The exam of the stomach was otherwise normal.                           Patchy mildly erythematous mucosa without active                            bleeding and with no stigmata of bleeding was found                            in the duodenal bulb.                           An acquired benign-appearing, intrinsic mild                            stenosis (non obstructing) was found in the second                            portion of the duodenum and was traversed.                           The exam of the duodenum was otherwise normal. Complications:            No immediate complications. Estimated Blood Loss:     Estimated blood loss was minimal. Impression:               - Esophageal mucosal changes secondary to                            established  long-segment Barrett's disease.                            Biopsied.                           - Medium-sized hiatal hernia.                           - Non-bleeding erosive gastropathy. Cameron                            erosions.                           - Erythematous duodenopathy.                           - Duodenal stenosis, mild. Recommendation:           -  Patient has a contact number available for                            emergencies. The signs and symptoms of potential                            delayed complications were discussed with the                            patient. Return to normal activities tomorrow.                            Written discharge instructions were provided to the                            patient.                           - Resume previous diet.                           - Continue present medications.                           - Follow antireflux measures long term.                           - Await pathology results.                           - Consider surveillance of Barrett's esophagus in 3                            years vs discontinuing due to age. Ladene Artist, MD 03/29/2019 3:34:22 PM This report has been signed electronically.

## 2019-03-29 NOTE — Patient Instructions (Signed)
YOU HAD AN ENDOSCOPIC PROCEDURE TODAY AT THE [facility]:   Refer to the procedure report that was given to you for any specific questions about what was found during the examination.  If the procedure report does not answer your questions, please call your gastroenterologist to clarify.  If you requested that your care partner not be given the details of your procedure findings, then the procedure report has been included in a sealed envelope for you to review at your convenience later.  YOU SHOULD EXPECT: Some feelings of bloating in the abdomen. Passage of more gas than usual.  Walking can help get rid of the air that was put into your GI tract during the procedure and reduce the bloating. If you had a lower endoscopy (such as a colonoscopy or flexible sigmoidoscopy) you may notice spotting of blood in your stool or on the toilet paper. If you underwent a bowel prep for your procedure, you may not have a normal bowel movement for a few days.  Please Note:  You might notice some irritation and congestion in your nose or some drainage.  This is from the oxygen used during your procedure.  There is no need for concern and it should clear up in a day or so.  SYMPTOMS TO REPORT IMMEDIATELY:   Following upper endoscopy (EGD)  Vomiting of blood or coffee ground material  New chest pain or pain under the shoulder blades  Painful or persistently difficult swallowing  New shortness of breath  Fever of 100F or higher  Black, tarry-looking stools  For urgent or emergent issues, a gastroenterologist can be reached at any hour by calling [phone].   DIET:  We do recommend a small meal at first, but then you may proceed to your regular diet.  Drink plenty of fluids but you should avoid alcoholic beverages for 24 hours.  ACTIVITY:  You should plan to take it easy for the rest of today and you should NOT DRIVE or use heavy machinery until tomorrow (because of the sedation medicines used  during the test).    FOLLOW UP: Our staff will call the number listed on your records 48-72 hours following your procedure to check on you and address any questions or concerns that you may have regarding the information given to you following your procedure. If we do not reach you, we will leave a message.  We will attempt to reach you two times.  During this call, we will ask if you have developed any symptoms of COVID 19. If you develop any symptoms (ie: fever, flu-like symptoms, shortness of breath, cough etc.) before then, please call [phone].  If you test positive for Covid 19 in the 2 weeks post procedure, please call and report this information to us.    If any biopsies were taken you will be contacted by phone or by letter within the next 1-3 weeks.  Please call us at [phone] if you have not heard about the biopsies in 3 weeks.    SIGNATURES/CONFIDENTIALITY: You and/or your care partner have signed paperwork which will be entered into your electronic medical record.  These signatures attest to the fact that that the information above on your After Visit Summary has been reviewed and is understood.  Full responsibility of the confidentiality of this discharge information lies with you and/or your care-partner. 

## 2019-03-29 NOTE — Progress Notes (Signed)
Called to room to assist during endoscopic procedure.  Patient ID and intended procedure confirmed with present staff. Received instructions for my participation in the procedure from the performing physician.  

## 2019-03-29 NOTE — Progress Notes (Signed)
Pt's states no medical or surgical changes since previsit or office visit.  Trenton Gammon took temp and Riki Sheer took vitals.

## 2019-03-29 NOTE — Progress Notes (Signed)
To PACU, VSS. Report to [email or web address] 

## 2019-03-31 ENCOUNTER — Telehealth: Payer: Self-pay

## 2019-03-31 NOTE — Telephone Encounter (Signed)
  Follow up Call-  Call back number 03/29/2019  Post procedure Call Back phone  # [phone]  Permission to leave phone message Yes  Some recent data might be hidden     Patient questions:  Do you have a fever, pain , or abdominal swelling? No. Pain Score  0 *  Have you tolerated food without any problems? Yes.    Have you been able to return to your normal activities? Yes.    Do you have any questions about your discharge instructions: Diet   No. Medications  No. Follow up visit  No.  Do you have questions or concerns about your Care? No.  Actions: * If pain score is 4 or above: No action needed, pain <4. 1. Have you developed a fever since your procedure? no  2.   Have you had an respiratory symptoms (SOB or cough) since your procedure? no  3.   Have you tested positive for COVID 19 since your procedure no  4.   Have you had any family members/close contacts diagnosed with the COVID 19 since your procedure?  no   If yes to any of these questions please route to Joylene John, RN and Alphonsa Gin, Therapist, sports.

## 2019-03-31 NOTE — Telephone Encounter (Signed)
Left voice mail

## 2019-04-10 ENCOUNTER — Encounter: Payer: Self-pay | Admitting: Gastroenterology

## 2019-04-14 ENCOUNTER — Ambulatory Visit (INDEPENDENT_AMBULATORY_CARE_PROVIDER_SITE_OTHER): Payer: PPO | Admitting: *Deleted

## 2019-04-14 DIAGNOSIS — Z0000 Encounter for general adult medical examination without abnormal findings: Secondary | ICD-10-CM | POA: Diagnosis not present

## 2019-04-14 NOTE — Progress Notes (Addendum)
Subjective:   [name] is a 78 y.o. male who presents for Medicare Annual/Subsequent preventive examination. I connected with patient by a telephone and verified that I am speaking with the correct person using two identifiers. Patient stated full name and DOB. Patient gave permission to continue with telephonic visit. Patient's location was at home and Nurse's location was at [place] office.   Review of Systems:    Cardiac Risk Factors include: advanced age (>74men, >41 women);hypertension;male gender;dyslipidemia Sleep patterns: feels rested on waking, gets up 0-1 times nightly to void and sleeps 7-8 hours nightly.    Home Safety/Smoke Alarms: Feels safe in home. Smoke alarms in place.  Living environment; residence and Firearm Safety: 1-story house/ trailer. Lives with wife, no needs for DME, good support system Seat Belt Safety/Bike Helmet: Wears seat belt.      Objective:    Vitals: There were no vitals taken for this visit.  There is no height or weight on file to calculate BMI.  Advanced Directives 04/14/2019 11/16/2017 03/06/2017 03/28/2016 02/07/2016 02/06/2016 01/18/2014  Does Patient Have a Medical Advance Directive? Yes No Yes Yes Yes No Patient has advance directive, copy not in chart  Type of Advance Directive [place];Living will - [place];Living will [place];Living will [place];Living will - -  Does patient want to make changes to medical advance directive? - - - - No - Patient declined - -  Copy of [place] in Chart? No - copy requested - Yes No - copy requested Yes - -  Would patient like information on creating a medical advance directive? - No - Patient declined - - - - -    Tobacco Social History   Tobacco Use  Smoking Status Former Smoker  . Quit date: 08/25/1961  . Years since quitting: 57.6  Smokeless Tobacco Never Used     Counseling given: Not Answered   Past Medical History:  Diagnosis Date  . Anemia   . Barrett's esophagus   . Blood transfusion without reported diagnosis   . CAD (coronary artery disease)    2002 stenting of the RCA,, 2006 Stenting of diag with DES, PTCA of Circ, 50% LAD  . Cancer ([place])    skin cancer scalp and forehead  . 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 Diverticulosis   . DJD (degenerative joint disease)   . Duodenal ulcer   . ED (erectile dysfunction)   . GERD (gastroesophageal reflux disease)    increased s/s in the last week 03-22-19 PV   . Hemorrhoids   . Hyperlipidemia   . Hypertension   . Myocardial infarction [facility])    2002   Past Surgical History:  Procedure Laterality Date  . COLONOSCOPY    . ELBOW ARTHROSCOPY    . heart stents     2002,2004  . SPINAL FUSION    . SPINAL FUSION    . SPINE SURGERY     cervical  . UPPER GASTROINTESTINAL ENDOSCOPY     Family History  Problem Relation Age of Onset  . Alcohol abuse Other   . Coronary artery disease Other   . Alzheimer's disease Other   . Dementia Mother   . Colon cancer Neg Hx   . Esophageal cancer Neg Hx   . Rectal cancer Neg Hx   . Stomach cancer Neg Hx   .  Colon polyps Neg Hx    Social History   Socioeconomic History  . Marital status: Married    Spouse name: Not on file  . Number of children: 1  . Years of education: Not on file  . Highest education level: Not on file  Occupational History  . 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 Financial resource strain: Not hard at all  . Food insecurity    Worry: Never true    Inability: Never true  . Transportation needs    Medical: No    Non-medical: No  Tobacco Use  . Smoking status: Former Smoker    Quit date: 08/25/1961    Years since quitting: 57.6  . Smokeless tobacco: Never Used  Substance and Sexual Activity  . Alcohol use: Yes    Comment: rarely  . Drug use: No  . Sexual activity: Not  Currently  Lifestyle  . Physical activity    Days per week: 5 days    Minutes per session: 40 min  . Stress: Not at all  Relationships  . Social connections    Talks on phone: More than three times a week    Gets together: More than three times a week    Attends religious service: 1 to 4 times per year    Active member of club or organization: Yes    Attends meetings of clubs or organizations: 1 to 4 times per year    Relationship status: Married  Other Topics Concern  . Not on file  Social History Narrative  . Not on file    Outpatient Encounter Medications as of 04/14/2019  Medication Sig  . acetaminophen (TYLENOL) 500 MG tablet Take 500 mg by mouth every 6 (six) hours as needed.  Marland Kitchen aspirin 81 MG EC tablet Take 81 mg by mouth daily.    Marland Kitchen atenolol (TENORMIN) 50 MG tablet Take 1 tablet (50 mg total) by mouth 2 (two) times daily.  Marland Kitchen atorvastatin (LIPITOR) 20 MG tablet Take 1 tablet (20 mg total) by mouth daily.  . Coenzyme Q10 (CO Q 10 PO) Take 1 tablet by mouth daily.  . hydrochlorothiazide (HYDRODIURIL) 25 MG tablet Take 1 tablet (25 mg total) by mouth every other day. Every M-W-F (Patient taking differently: Take 25 mg by mouth 2 (two) times a week. Every Monday and Friday)  . KLOR-CON M10 10 MEQ tablet Take 10 mEq by mouth 2 (two) times a week. M-F  . KRILL OIL PO Take 1 tablet by mouth daily.  Marland Kitchen lisinopril (ZESTRIL) 20 MG tablet Take 1 tablet (20 mg total) by mouth daily.  . Multiple Vitamins-Minerals (CENTRUM SILVER 50+MEN PO) Take 1 tablet by mouth.  . nitroGLYCERIN (NITROSTAT) 0.4 MG SL tablet Place 1 tablet (0.4 mg total) under the tongue every 5 (five) minutes as needed.  . pantoprazole (PROTONIX) 40 MG tablet Take 1 tablet (40 mg total) by mouth daily.  . primidone (MYSOLINE) 50 MG tablet Take 1 tablet (50 mg total) by mouth 2 (two) times daily.  . verapamil (CALAN-SR) 180 MG CR tablet Take 1 tablet (180 mg total) by mouth daily.   No facility-administered encounter  medications on file as of 04/14/2019.     Activities of Daily Living In your present state of health, do you have any difficulty performing the following activities: 04/14/2019  Hearing? N  Vision? N  Difficulty concentrating or making decisions? N  Walking or climbing stairs? N  Dressing or bathing?  N  Doing errands, shopping? N  Preparing Food and eating ? N  Using the Toilet? N  In the past six months, have you accidently leaked urine? N  Do you have problems with loss of bowel control? N  Managing your Medications? N  Managing your Finances? N  Housekeeping or managing your Housekeeping? N  Some recent data might be hidden    Patient Care Team: [name], MD as PCP - General (Internal Medicine)   Assessment:   This is a routine wellness examination for [place]. Physical assessment deferred to PCP.  Exercise Activities and Dietary recommendations Current Exercise Habits: Home exercise routine, Type of exercise: walking, Time (Minutes): 50, Frequency (Times/Week): 5, Weekly Exercise (Minutes/Week): 250, Exercise limited by: orthopedic condition(s)  Diet (meal preparation, eat out, water intake, caffeinated beverages, dairy products, fruits and vegetables): in general, a "healthy" diet  , well balanced. eats a variety of fruits and vegetables daily, limits salt, fat/cholesterol, sugar,carbohydrates,caffeine, drinks 6-8 glasses of water daily.  Goals   None     Fall Risk Fall Risk  04/14/2019 03/10/2019 10/14/2018 10/08/2017 04/07/2017  Falls in the past year? 0 0 1 No No  Number falls in past yr: 0 0 0 - -  Injury with Fall? - 0 0 - -  Risk Factor Category  - - - - -  Risk for fall due to : Impaired balance/gait;Impaired mobility - - - -  Follow up - Falls evaluation completed Falls evaluation completed - -    Depression Screen PHQ 2/9 Scores 04/14/2019 03/10/2019 03/06/2017 03/03/2017  PHQ - 2 Score 0 0 0 0  PHQ- 9 Score - - - 0    Cognitive Function       Ad8  score reviewed for issues:  Issues making decisions: no  Less interest in hobbies / activities: no  Repeats questions, stories (family complaining): no  Trouble using ordinary gadgets (microwave, computer, phone):no  Forgets the month or year: no  Mismanaging finances: no  Remembering appts: no  Daily problems with thinking and/or memory: no Ad8 score is= 0  Immunization History  Administered Date(s) Administered  . H1N1 09/22/2008  . Influenza Split 05/18/2012  . Influenza Whole 08/25/2005, 06/08/2008, 07/04/2009, 06/24/2010  . Influenza, High Dose Seasonal PF 06/21/2015, 05/08/2016, 06/27/2017, 06/14/2018  . Influenza,inj,Quad PF,6+ Mos 07/20/2013, 06/23/2014  . Pneumococcal Conjugate-13 01/18/2014  . Pneumococcal Polysaccharide-23 08/25/2002, 09/21/2007, 02/06/2016  . Td 08/25/2006  . Tdap 09/22/2016  . Zoster 10/20/2008   Screening Tests Health Maintenance  Topic Date Due  . INFLUENZA VACCINE  03/26/2019  . TETANUS/TDAP  09/22/2026  . PNA vac Low Risk Adult  Completed      Plan:    Reviewed health maintenance screenings with patient today and relevant education, vaccines, and/or referrals were provided.   I have personally reviewed and noted the following in the patient's chart:   . Medical and social history . Use of alcohol, tobacco or illicit drugs  . Current medications and supplements . Functional ability and status . Nutritional status . Physical activity . Advanced directives . List of other physicians . Screenings to include cognitive, depression, and falls . Referrals and appointments  In addition, I have reviewed and discussed with patient certain preventive protocols, quality metrics, and best practice recommendations. A written personalized care plan for preventive services as well as general preventive health recommendations were provided to patient.     Michiel Cowboy, RN  04/14/2019  Medical screening examination/treatment/procedure(s)  were performed  by non-physician practitioner and as supervising physician I was immediately available for consultation/collaboration. I agree with above. Scarlette Calico, MD

## 2019-05-07 ENCOUNTER — Other Ambulatory Visit: Payer: Self-pay

## 2019-05-07 ENCOUNTER — Ambulatory Visit (INDEPENDENT_AMBULATORY_CARE_PROVIDER_SITE_OTHER): Payer: PPO

## 2019-05-07 DIAGNOSIS — Z23 Encounter for immunization: Secondary | ICD-10-CM

## 2019-06-05 ENCOUNTER — Other Ambulatory Visit: Payer: Self-pay | Admitting: Internal Medicine

## 2019-06-13 DIAGNOSIS — H353112 Nonexudative age-related macular degeneration, right eye, intermediate dry stage: Secondary | ICD-10-CM | POA: Diagnosis not present

## 2019-06-15 ENCOUNTER — Other Ambulatory Visit: Payer: Self-pay | Admitting: Dermatology

## 2019-06-15 DIAGNOSIS — D044 Carcinoma in situ of skin of scalp and neck: Secondary | ICD-10-CM | POA: Diagnosis not present

## 2019-06-15 DIAGNOSIS — L570 Actinic keratosis: Secondary | ICD-10-CM | POA: Diagnosis not present

## 2019-06-15 DIAGNOSIS — D229 Melanocytic nevi, unspecified: Secondary | ICD-10-CM | POA: Diagnosis not present

## 2019-07-14 DIAGNOSIS — D044 Carcinoma in situ of skin of scalp and neck: Secondary | ICD-10-CM | POA: Diagnosis not present

## 2019-08-31 ENCOUNTER — Other Ambulatory Visit: Payer: Self-pay | Admitting: Internal Medicine

## 2019-08-31 DIAGNOSIS — E785 Hyperlipidemia, unspecified: Secondary | ICD-10-CM

## 2019-08-31 DIAGNOSIS — I2510 Atherosclerotic heart disease of native coronary artery without angina pectoris: Secondary | ICD-10-CM

## 2019-08-31 DIAGNOSIS — K2270 Barrett's esophagus without dysplasia: Secondary | ICD-10-CM

## 2019-08-31 DIAGNOSIS — I10 Essential (primary) hypertension: Secondary | ICD-10-CM

## 2019-08-31 DIAGNOSIS — K219 Gastro-esophageal reflux disease without esophagitis: Secondary | ICD-10-CM

## 2019-09-05 ENCOUNTER — Ambulatory Visit: Payer: Medicare Other | Attending: Internal Medicine

## 2019-09-05 DIAGNOSIS — Z23 Encounter for immunization: Secondary | ICD-10-CM | POA: Insufficient documentation

## 2019-09-05 NOTE — Progress Notes (Signed)
   Covid-19 Vaccination Clinic  Name:  [name]    MRN: [id number] DOB: 08-Aug-1941  09/05/2019  [name] was observed post Covid-19 immunization for 15 minutes without incidence. He was provided with Vaccine Information Sheet and instruction to access the V-Safe system.   [name] was instructed to call 911 with any severe reactions post vaccine: Marland Kitchen Difficulty breathing  . Swelling of your face and throat  . A fast heartbeat  . A bad rash all over your body  . Dizziness and weakness

## 2019-09-25 ENCOUNTER — Ambulatory Visit: Payer: PPO | Attending: Internal Medicine

## 2019-09-25 DIAGNOSIS — Z23 Encounter for immunization: Secondary | ICD-10-CM | POA: Insufficient documentation

## 2019-09-25 NOTE — Progress Notes (Signed)
   Covid-19 Vaccination Clinic  Name:  [name]    MRN: [id number] DOB: 1941-07-08  09/25/2019  [name] was observed post Covid-19 immunization for 15 minutes without incidence. He was provided with Vaccine Information Sheet and instruction to access the V-Safe system.   [name] was instructed to call 911 with any severe reactions post vaccine: Marland Kitchen Difficulty breathing  . Swelling of your face and throat  . A fast heartbeat  . A bad rash all over your body  . Dizziness and weakness    Immunizations Administered    Name Date Dose VIS Date Route   Pfizer COVID-19 Vaccine 09/25/2019  9:47 AM 0.3 mL 08/05/2019 Intramuscular   Manufacturer: [place]   Lot: [id number]   [place]: [id number]

## 2019-10-17 NOTE — Progress Notes (Signed)
[name] was seen today in follow up for essential tremor.  "My tremor has been great."  Able to drink his coffee without trouble.  No new medical troubles.   My previous records were reviewed prior to todays visit. Pt denies falls.  Pt denies lightheadedness, near syncope.  No hallucinations.  Mood has been good.  Has gotten both his covid vaccines.  Current prescribed movement disorder medications: Primidone, 50 mg twice daily   ALLERGIES:  No Known Allergies  CURRENT MEDICATIONS:  Outpatient Encounter Medications as of 10/18/2019  Medication Sig  . acetaminophen (TYLENOL) 500 MG tablet Take 500 mg by mouth every 6 (six) hours as needed.  Marland Kitchen aspirin 81 MG EC tablet Take 81 mg by mouth daily.    Marland Kitchen atenolol (TENORMIN) 50 MG tablet TAKE 1 TABLET BY MOUTH TWICE A DAY  . atorvastatin (LIPITOR) 20 MG tablet TAKE 1 TABLET BY MOUTH EVERY DAY  . Coenzyme Q10 (CO Q 10 PO) Take 1 tablet by mouth daily.  . hydrochlorothiazide (HYDRODIURIL) 25 MG tablet Take 1 tablet (25 mg total) by mouth every other day. Every M-W-F (Patient taking differently: Take 25 mg by mouth 2 (two) times a week. Every Monday and Friday)  . KLOR-CON M10 10 MEQ tablet TAKE 1 TABLET BY MOUTH ON MONDAY, WEDNESDAY AND FRIDAY  . KRILL OIL PO Take 1 tablet by mouth daily.  Marland Kitchen lisinopril (ZESTRIL) 20 MG tablet TAKE 1 TABLET BY MOUTH EVERY DAY  . Multiple Vitamins-Minerals (CENTRUM SILVER 50+MEN PO) Take 1 tablet by mouth.  . nitroGLYCERIN (NITROSTAT) 0.4 MG SL tablet Place 1 tablet (0.4 mg total) under the tongue every 5 (five) minutes as needed.  . pantoprazole (PROTONIX) 40 MG tablet TAKE 1 TABLET BY MOUTH EVERY DAY  . primidone (MYSOLINE) 50 MG tablet Take 1 tablet (50 mg total) by mouth 2 (two) times daily.  . verapamil (CALAN-SR) 180 MG CR tablet TAKE 1 TABLET BY MOUTH EVERY DAY   No facility-administered encounter medications on file as of 10/18/2019.    PHYSICAL EXAMINATION:    VITALS:   Vitals:   10/18/19  1034  BP: (!) 153/83  Pulse: (!) 56  SpO2: 97%  Weight: 173 lb (78.5 kg)  Height: 5\' 6"  (1.676 m)    GEN:  The patient appears stated age and is in NAD. HEENT:  Normocephalic, atraumatic.  The mucous membranes are moist. The superficial temporal arteries are without ropiness or tenderness. CV:  Loletha Grayer.  Regular.   Lungs:  CTAB Neck/HEME:  There are no carotid bruits bilaterally.  Neurological examination:  Orientation: The patient is alert and oriented x3. Cranial nerves: There is good facial symmetry. The speech is fluent and clear. Soft palate rises symmetrically and there is no tongue deviation. Hearing is intact to conversational tone. Sensation: Sensation is intact to light touch throughout Motor: Strength is at least antigravity x4.  Movement examination: Tone: There is normal tone in the UE/LE Abnormal movements: there is LUE rest tremor.  There is postural tremor on the L Coordination:  There is no decremation with RAM's, with any form of RAMS, including alternating supination and pronation of the forearm, hand opening and closing, finger taps Gait and Station: The patient has no difficulty arising out of a deep-seated chair without the use of the hands. The patient's stride length is good  I have reviewed and interpreted the following labs independently   Chemistry      Component Value Date/Time   NA 138  03/10/2019 1032   K 4.7 03/10/2019 1032   CL 102 03/10/2019 1032   CO2 30 03/10/2019 1032   BUN 21 03/10/2019 1032   CREATININE 1.19 03/10/2019 1032      Component Value Date/Time   CALCIUM 9.7 03/10/2019 1032   ALKPHOS 68 03/10/2019 1032   AST 18 03/10/2019 1032   ALT 16 03/10/2019 1032   BILITOT 0.6 03/10/2019 1032     Lab Results  Component Value Date   TSH 0.83 03/10/2019     ASSESSMENT/PLAN:  1.  Essential Tremor  -Continue primidone, 50 mg twice per day.  Pt still has some LUE tremor but not bothersome to him.    -since tremor dose stable, pt  going to f/u prn.  Did write RX for 1 year.  Following that, pt going to f/u with PCP for refills.  Total time spent on today's visit was 20 min, including both face-to-face time and nonface-to-face time.  Time included that spent on review of records (prior notes available to me/labs/imaging if pertinent), discussing treatment and goals, answering patient's questions and coordinating care.  Cc:  [name], MD

## 2019-10-18 ENCOUNTER — Ambulatory Visit: Payer: PPO | Admitting: Neurology

## 2019-10-18 ENCOUNTER — Encounter: Payer: Self-pay | Admitting: Neurology

## 2019-10-18 ENCOUNTER — Other Ambulatory Visit: Payer: Self-pay

## 2019-10-18 VITALS — BP 153/83 | HR 56 | Ht 66.0 in | Wt 173.0 lb

## 2019-10-18 DIAGNOSIS — G250 Essential tremor: Secondary | ICD-10-CM | POA: Diagnosis not present

## 2019-10-18 MED ORDER — PRIMIDONE 50 MG PO TABS: 50.0000 mg | ORAL_TABLET | Freq: Two times a day (BID) | ORAL | 3 refills | Status: DC

## 2019-10-18 NOTE — Patient Instructions (Addendum)
I filled your primidone for one year.  After that, Dr. Ronnald Ramp can fill the medication if he doesn't mind.  Otherwise, I will see you as needed.  The physicians and staff at [facility] are committed to providing excellent care. You may receive a survey requesting feedback about your experience at our office. We strive to receive "very good" responses to the survey questions. If you feel that your experience would prevent you from giving the office a "very good " response, please contact our office to try to remedy the situation. We may be reached at [phone]. Thank you for taking the time out of your busy day to complete the survey.

## 2019-10-19 DIAGNOSIS — Z7189 Other specified counseling: Secondary | ICD-10-CM | POA: Insufficient documentation

## 2019-10-19 NOTE — Progress Notes (Signed)
Cardiology Office Note   Date:  10/20/2019   ID:  T[place], [name] 1941-05-24, MRN [id number]  PCP:  [name], MD  Cardiologist:   Minus Breeding, MD   Chief Complaint  Patient presents with  . Coronary Artery Disease      History of Present Illness: [name] is a 79 y.o. male who presents for follow up of his CAD.  Since I last saw him he has done well.  The patient denies any new symptoms such as chest discomfort, neck or arm discomfort. There has been no new shortness of breath, PND or orthopnea. There have been no reported palpitations, presyncope or syncope. He exercises routinely.  Last year he did have some chest discomfort but he had a negative POET (Plain Old Exercise Treadmill)    Past Medical History:  Diagnosis Date  . Anemia   . Barrett's esophagus   . Blood transfusion without reported diagnosis   . CAD (coronary artery disease)    2002 stenting of the RCA,, 2006 Stenting of diag with DES, PTCA of Circ, 50% LAD  . Cancer ([place])    skin cancer scalp and forehead  . 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 Diverticulosis   . DJD (degenerative joint disease)   . Duodenal ulcer   . ED (erectile dysfunction)   . GERD (gastroesophageal reflux disease)    increased s/s in the last week 03-22-19 PV   . Hemorrhoids   . Hyperlipidemia   . Hypertension   . Myocardial infarction [facility])    2002    Past Surgical History:  Procedure Laterality Date  . COLONOSCOPY    . ELBOW ARTHROSCOPY    . heart stents     2002,2004  . SPINAL FUSION    . SPINAL FUSION    . SPINE SURGERY     cervical  . UPPER GASTROINTESTINAL ENDOSCOPY       Current Outpatient Medications  Medication Sig Dispense Refill  . acetaminophen (TYLENOL) 500 MG tablet Take 500 mg by mouth every 6 (six) hours as needed.    Marland Kitchen aspirin 81 MG EC tablet Take 81 mg by mouth daily.      Marland Kitchen atenolol (TENORMIN) 50 MG tablet TAKE 1 TABLET BY  MOUTH TWICE A DAY 180 tablet 0  . atorvastatin (LIPITOR) 20 MG tablet TAKE 1 TABLET BY MOUTH EVERY DAY 90 tablet 0  . Coenzyme Q10 (CO Q 10 PO) Take 1 tablet by mouth daily.    . hydrochlorothiazide (HYDRODIURIL) 25 MG tablet Take 1 tablet (25 mg total) by mouth every other day. Every M-W-F (Patient taking differently: Take 25 mg by mouth 2 (two) times a week. Every Monday and Friday) 45 tablet 1  . KLOR-CON M10 10 MEQ tablet TAKE 1 TABLET BY MOUTH ON MONDAY, WEDNESDAY AND FRIDAY 40 tablet 1  . KRILL OIL PO Take 1 tablet by mouth daily.    Marland Kitchen lisinopril (ZESTRIL) 20 MG tablet TAKE 1 TABLET BY MOUTH EVERY DAY 90 tablet 0  . Multiple Vitamins-Minerals (CENTRUM SILVER 50+MEN PO) Take 1 tablet by mouth.    . nitroGLYCERIN (NITROSTAT) 0.4 MG SL tablet Place 1 tablet (0.4 mg total) under the tongue every 5 (five) minutes as needed. 25 tablet 1  . pantoprazole (PROTONIX) 40 MG tablet TAKE 1 TABLET BY MOUTH EVERY DAY 90 tablet 0  . primidone (MYSOLINE) 50 MG tablet  Take 1 tablet (50 mg total) by mouth 2 (two) times daily. 180 tablet 3  . verapamil (CALAN-SR) 180 MG CR tablet TAKE 1 TABLET BY MOUTH EVERY DAY 90 tablet 0   No current facility-administered medications for this visit.    Allergies:   Patient has no known allergies.    ROS:  Please see the history of present illness.   Otherwise, review of systems are positive for [name].   All other systems are reviewed and negative.    PHYSICAL EXAM: VS:  BP (!) 142/88   Pulse (!) 47   Temp 97.7 F (36.5 C)   Ht 5\' 6"  (1.676 m)   Wt 171 lb 3.2 oz (77.7 kg)   SpO2 98%   BMI 27.63 kg/m  , BMI Body mass index is 27.63 kg/m. GENERAL:  Well appearing NECK:  No jugular venous distention, waveform within normal limits, carotid upstroke brisk and symmetric, no bruits, no thyromegaly LUNGS:  Clear to auscultation bilaterally CHEST:  Unremarkable HEART:  PMI not displaced or sustained,S1 and S2 within normal limits, no S3, no S4, no clicks, no rubs, no  murmurs ABD:  Flat, positive bowel sounds normal in frequency in pitch, no bruits, no rebound, no guarding, no midline pulsatile mass, no hepatomegaly, no splenomegaly EXT:  2 plus pulses throughout, no edema, no cyanosis no clubbing   EKG:  EKG is ordered today. The ekg ordered today demonstrates sinus rhythm, rate 87, axis within normal limits, intervals within normal limits, no acute ST-T wave changes.   Recent Labs: 03/10/2019: ALT 16; BUN 21; Creatinine, Ser 1.19; Hemoglobin 14.8; Platelets 207.0; Potassium 4.7; Sodium 138; TSH 0.83    Lipid Panel    Component Value Date/Time   CHOL 126 03/10/2019 1032   TRIG 101.0 03/10/2019 1032   TRIG 161 (H) 08/28/2006 1023   HDL 42.40 03/10/2019 1032   CHOLHDL 3 03/10/2019 1032   VLDL 20.2 03/10/2019 1032   LDLCALC 64 03/10/2019 1032      Wt Readings from Last 3 Encounters:  10/20/19 171 lb 3.2 oz (77.7 kg)  10/18/19 173 lb (78.5 kg)  03/29/19 170 lb (77.1 kg)      Other studies Reviewed: Additional studies/ records that were reviewed today include: Labs. Review of the above records demonstrates:  Please see elsewhere in the note.     ASSESSMENT AND PLAN:   CORONARY ARTERY DISEASE, S/P PTCA -  The patient has no new sypmtoms.  No further cardiovascular testing is indicated.  We will continue with aggressive risk reduction and meds as listed.  HYPERLIPIDEMIA -  His LDL was  64 with an HDL of 42.  No change in therapy.   HYPERTENSION -  The blood pressure is very mildly elevated but he says is not this way at home but he can keep a watch on this.  No change in therapy.    COVID EDUCATION:   He has had his vaccine    Current medicines are reviewed at length with the patient today.  The patient does not have concerns regarding medicines.  The following changes have been made:  no change  Labs/ tests ordered today include: [name]  Orders Placed This Encounter  Procedures  . EKG 12-Lead     Disposition:   FU with me  in 12 months.     Signed, Minus Breeding, MD  10/20/2019 2:10 PM    [facility]

## 2019-10-20 ENCOUNTER — Other Ambulatory Visit: Payer: Self-pay

## 2019-10-20 ENCOUNTER — Encounter: Payer: Self-pay | Admitting: Cardiology

## 2019-10-20 ENCOUNTER — Ambulatory Visit: Payer: PPO | Admitting: Cardiology

## 2019-10-20 VITALS — BP 142/88 | HR 47 | Temp 97.7°F | Ht 66.0 in | Wt 171.2 lb

## 2019-10-20 DIAGNOSIS — E785 Hyperlipidemia, unspecified: Secondary | ICD-10-CM

## 2019-10-20 DIAGNOSIS — Z7189 Other specified counseling: Secondary | ICD-10-CM

## 2019-10-20 DIAGNOSIS — I2510 Atherosclerotic heart disease of native coronary artery without angina pectoris: Secondary | ICD-10-CM | POA: Diagnosis not present

## 2019-10-20 DIAGNOSIS — I10 Essential (primary) hypertension: Secondary | ICD-10-CM

## 2019-10-20 NOTE — Patient Instructions (Signed)
Medication Instructions:  No Changes *If you need a refill on your cardiac medications before your next appointment, please call your pharmacy*  Lab Work: None If you have labs (blood work) drawn today and your tests are completely normal, you will receive your results only by: Marland Kitchen MyChart Message (if you have MyChart) OR . A paper copy in the mail If you have any lab test that is abnormal or we need to change your treatment, we will call you to review the results.  Testing/Procedures: None  Follow-Up: At [facility], you and your health needs are our priority.  As part of our continuing mission to provide you with exceptional heart care, we have created designated Provider Care Teams.  These Care Teams include your primary Cardiologist (physician) and Advanced Practice Providers (APPs -  Physician Assistants and Nurse Practitioners) who all work together to provide you with the care you need, when you need it.  Your next appointment:   1 year(s) You will receive a reminder letter in the mail two months in advance. If you don't receive a letter, please call our office to schedule the follow-up appointment.  The format for your next appointment:   In Person  Provider:   Minus Breeding, MD

## 2019-10-31 DIAGNOSIS — E785 Hyperlipidemia, unspecified: Secondary | ICD-10-CM

## 2019-10-31 DIAGNOSIS — I2510 Atherosclerotic heart disease of native coronary artery without angina pectoris: Secondary | ICD-10-CM

## 2019-10-31 DIAGNOSIS — I10 Essential (primary) hypertension: Secondary | ICD-10-CM

## 2019-11-01 MED ORDER — LISINOPRIL 40 MG PO TABS: 40.0000 mg | ORAL_TABLET | Freq: Every day | ORAL | 3 refills | Status: DC

## 2019-11-01 MED ORDER — ATORVASTATIN CALCIUM 20 MG PO TABS: 20.0000 mg | ORAL_TABLET | Freq: Every day | ORAL | 3 refills | Status: DC

## 2019-11-01 MED ORDER — ATORVASTATIN CALCIUM 40 MG PO TABS: 40.0000 mg | ORAL_TABLET | Freq: Every day | ORAL | 3 refills | Status: DC

## 2019-11-21 DIAGNOSIS — H40053 Ocular hypertension, bilateral: Secondary | ICD-10-CM | POA: Diagnosis not present

## 2019-11-21 DIAGNOSIS — H40013 Open angle with borderline findings, low risk, bilateral: Secondary | ICD-10-CM | POA: Diagnosis not present

## 2019-11-21 DIAGNOSIS — H353112 Nonexudative age-related macular degeneration, right eye, intermediate dry stage: Secondary | ICD-10-CM | POA: Diagnosis not present

## 2019-11-21 DIAGNOSIS — H35721 Serous detachment of retinal pigment epithelium, right eye: Secondary | ICD-10-CM | POA: Diagnosis not present

## 2019-11-25 ENCOUNTER — Other Ambulatory Visit: Payer: Self-pay | Admitting: Internal Medicine

## 2019-11-25 DIAGNOSIS — I2510 Atherosclerotic heart disease of native coronary artery without angina pectoris: Secondary | ICD-10-CM

## 2019-11-25 DIAGNOSIS — K219 Gastro-esophageal reflux disease without esophagitis: Secondary | ICD-10-CM

## 2019-11-25 DIAGNOSIS — I10 Essential (primary) hypertension: Secondary | ICD-10-CM

## 2019-11-25 DIAGNOSIS — K2270 Barrett's esophagus without dysplasia: Secondary | ICD-10-CM

## 2019-12-05 NOTE — Progress Notes (Addendum)
Cardiology Office Note   Date:  12/07/2019   ID:  [name], DOB 10-30-1940, MRN [id number]  PCP:  [name], MD  Cardiologist:   Minus Breeding, MD   Chief Complaint  Patient presents with  . Hypertension      History of Present Illness: [name] is a 79 y.o. male who presents for follow up of his CAD.  Since I last saw him he called because he was concerned about his BP readings.  He was running in the [id number] systolic last week its been a little lower but still fairly consistently in the 150s.  He is has felt okay.  He has not had any chest pressure, neck or arm discomfort.  He has not had any new shortness of breath, PND or orthopnea.  He has had no weight gain or edema.   Past Medical History:  Diagnosis Date  . Anemia   . Barrett's esophagus   . Blood transfusion without reported diagnosis   . CAD (coronary artery disease)    2002 stenting of the RCA,, 2006 Stenting of diag with DES, PTCA of Circ, 50% LAD  . Cancer ([place])    skin cancer scalp and forehead  . 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 Diverticulosis   . DJD (degenerative joint disease)   . Duodenal ulcer   . ED (erectile dysfunction)   . GERD (gastroesophageal reflux disease)    increased s/s in the last week 03-22-19 PV   . Hemorrhoids   . Hyperlipidemia   . Hypertension   . Myocardial infarction [facility])    2002    Past Surgical History:  Procedure Laterality Date  . COLONOSCOPY    . ELBOW ARTHROSCOPY    . heart stents     2002,2004  . SPINAL FUSION    . SPINAL FUSION    . SPINE SURGERY     cervical  . UPPER GASTROINTESTINAL ENDOSCOPY       Current Outpatient Medications  Medication Sig Dispense Refill  . acetaminophen (TYLENOL) 500 MG tablet Take 500 mg by mouth every 6 (six) hours as needed.    Marland Kitchen aspirin 81 MG EC tablet Take 81 mg by mouth daily.      Marland Kitchen atenolol (TENORMIN) 50 MG tablet TAKE 1 TABLET BY MOUTH TWICE A  DAY 180 tablet 1  . atorvastatin (LIPITOR) 20 MG tablet Take 1 tablet (20 mg total) by mouth daily. 90 tablet 3  . Coenzyme Q10 (CO Q 10 PO) Take 1 tablet by mouth daily.    Marland Kitchen KRILL OIL PO Take 1 tablet by mouth daily.    Marland Kitchen lisinopril (ZESTRIL) 20 MG tablet TAKE 1 TABLET BY MOUTH EVERY DAY 90 tablet 1  . lisinopril (ZESTRIL) 40 MG tablet Take 1 tablet (40 mg total) by mouth daily. 90 tablet 3  . Multiple Vitamins-Minerals (CENTRUM SILVER 50+MEN PO) Take 1 tablet by mouth.    . nitroGLYCERIN (NITROSTAT) 0.4 MG SL tablet Place 1 tablet (0.4 mg total) under the tongue every 5 (five) minutes as needed. 25 tablet 1  . pantoprazole (PROTONIX) 40 MG tablet TAKE 1 TABLET BY MOUTH EVERY DAY 90 tablet 1  . primidone (MYSOLINE) 50 MG tablet Take 1 tablet (50 mg total) by mouth 2 (two) times daily. 180 tablet 3  . verapamil (CALAN-SR) 180 MG CR tablet TAKE 1 TABLET BY MOUTH EVERY DAY 90  tablet 1  . spironolactone (ALDACTONE) 25 MG tablet Take 1 tablet (25 mg total) by mouth daily. 90 tablet 3   No current facility-administered medications for this visit.    Allergies:   Patient has no known allergies.    ROS:  Please see the history of present illness.   Otherwise, review of systems are positive for none.   All other systems are reviewed and negative.    PHYSICAL EXAM: VS:  BP (!) 156/92   Pulse 89   Temp (!) 97.2 F (36.2 C)   Ht 5\' 6"  (1.676 m)   Wt 176 lb 3.2 oz (79.9 kg)   SpO2 98%   BMI 28.44 kg/m  , BMI Body mass index is 28.44 kg/m. GENERAL:  Well appearing NECK:  No jugular venous distention, waveform within normal limits, carotid upstroke brisk and symmetric, no bruits, no thyromegaly LUNGS:  Clear to auscultation bilaterally CHEST:  Unremarkable HEART:  PMI not displaced or sustained,S1 and S2 within normal limits, no S3, no S4, no clicks, no rubs, no murmurs ABD:  Flat, positive bowel sounds normal in frequency in pitch, no bruits, no rebound, no guarding, no midline pulsatile  mass, no hepatomegaly, no splenomegaly EXT:  2 plus pulses throughout, no edema, no cyanosis no clubbing   EKG:  EKG is not ordered today.    Recent Labs: 03/10/2019: ALT 16; BUN 21; Creatinine, Ser 1.19; Hemoglobin 14.8; Platelets 207.0; Potassium 4.7; Sodium 138; TSH 0.83    Lipid Panel    Component Value Date/Time   CHOL 126 03/10/2019 1032   TRIG 101.0 03/10/2019 1032   TRIG 161 (H) 08/28/2006 1023   HDL 42.40 03/10/2019 1032   CHOLHDL 3 03/10/2019 1032   VLDL 20.2 03/10/2019 1032   LDLCALC 64 03/10/2019 1032      Wt Readings from Last 3 Encounters:  12/07/19 176 lb 3.2 oz (79.9 kg)  10/20/19 171 lb 3.2 oz (77.7 kg)  10/18/19 173 lb (78.5 kg)      Other studies Reviewed: Additional studies/ records that were reviewed today include: Labs. Review of the above records demonstrates:  See elsewhere    ASSESSMENT AND PLAN:   CORONARY ARTERY DISEASE, S/P PTCA -  The patient has no new symptoms.  No change in therapy.  We are continuing risk reduction.   HYPERLIPIDEMIA -  His LDL was  sixty-four previously no change in therapy.   HYPERTENSION -  The blood pressure is still elevated as above.  I am going to stop his K-Dur and his hydrochlorothiazide.  I am going to add spironolactone 25 mg daily.  I do note that he has some renal insufficiency.  I plan to watch his creatinine very carefully when checking it next week.  Check in no longer than 1 month after that it is stable and will plan to follow a few times per year.  COVID EDUCATION:   He has had his vaccine    Current medicines are reviewed at length with the patient today.  The patient does not have concerns regarding medicines.  The following changes have been made:  no change  Labs/ tests ordered today include: None  Orders Placed This Encounter  Procedures  . Basic metabolic panel  . Basic metabolic panel  . Basic metabolic panel     Disposition:   FU with me in 12 months.     Signed, Minus Breeding, MD  12/07/2019 11:08 AM    [place]

## 2019-12-07 ENCOUNTER — Ambulatory Visit: Payer: PPO | Admitting: Cardiology

## 2019-12-07 ENCOUNTER — Other Ambulatory Visit: Payer: Self-pay

## 2019-12-07 ENCOUNTER — Encounter: Payer: Self-pay | Admitting: Cardiology

## 2019-12-07 VITALS — BP 156/92 | HR 89 | Temp 97.2°F | Ht 66.0 in | Wt 176.2 lb

## 2019-12-07 DIAGNOSIS — E785 Hyperlipidemia, unspecified: Secondary | ICD-10-CM | POA: Diagnosis not present

## 2019-12-07 DIAGNOSIS — Z79899 Other long term (current) drug therapy: Secondary | ICD-10-CM

## 2019-12-07 DIAGNOSIS — I2510 Atherosclerotic heart disease of native coronary artery without angina pectoris: Secondary | ICD-10-CM

## 2019-12-07 DIAGNOSIS — I10 Essential (primary) hypertension: Secondary | ICD-10-CM | POA: Diagnosis not present

## 2019-12-07 MED ORDER — SPIRONOLACTONE 25 MG PO TABS: 25.0000 mg | ORAL_TABLET | Freq: Every day | ORAL | 3 refills | Status: DC

## 2019-12-07 NOTE — Patient Instructions (Addendum)
Medication Instructions:  STOP POTASSIUM STOP HCTZ START SPIRONOLACTONE 25MG  DAILY *If you need a refill on your cardiac medications before your next appointment, please call your pharmacy*  Lab Work: Your physician recommends that you return for lab work next week (BMP) Your physician recommends that you return for lab work in one month 5/21 (BMP) Your physician recommends that you return for lab work in 3 months 8/21 (BMP)  Testing/Procedures: NONE ORDERED THIS VISIT  Follow-Up: At [facility], you and your health needs are our priority.  As part of our continuing mission to provide you with exceptional heart care, we have created designated Provider Care Teams.  These Care Teams include your primary Cardiologist (physician) and Advanced Practice Providers (APPs -  Physician Assistants and Nurse Practitioners) who all work together to provide you with the care you need, when you need it.  Your next appointment:   6 month(s)  You will receive a reminder letter in the mail two months in advance. If you don't receive a letter, please call our office to schedule the follow-up appointment.  The format for your next appointment:   In Person  Provider:   Minus Breeding, MD

## 2019-12-15 DIAGNOSIS — Z79899 Other long term (current) drug therapy: Secondary | ICD-10-CM | POA: Diagnosis not present

## 2019-12-15 LAB — BASIC METABOLIC PANEL
BUN/Creatinine Ratio: 17 (ref 10–24)
BUN: 22 mg/dL (ref 8–27)
CO2: 28 mmol/L (ref 20–29)
Calcium: 10.4 mg/dL — ABNORMAL HIGH (ref 8.6–10.2)
Chloride: 101 mmol/L (ref 96–106)
Creatinine, Ser: 1.3 mg/dL — ABNORMAL HIGH (ref 0.76–1.27)
GFR calc Af Amer: 59 mL/min/{1.73_m2} — ABNORMAL LOW (ref >59)
GFR calc non Af Amer: 51 mL/min/{1.73_m2} — ABNORMAL LOW (ref >59)
Glucose: 82 mg/dL (ref 65–99)
Potassium: 5.4 mmol/L — ABNORMAL HIGH (ref 3.5–5.2)
Sodium: 140 mmol/L (ref 134–144)

## 2019-12-19 ENCOUNTER — Other Ambulatory Visit: Payer: Self-pay

## 2019-12-19 DIAGNOSIS — Z79899 Other long term (current) drug therapy: Secondary | ICD-10-CM

## 2019-12-22 ENCOUNTER — Encounter: Payer: Self-pay | Admitting: *Deleted

## 2019-12-26 ENCOUNTER — Ambulatory Visit: Payer: PPO | Admitting: Dermatology

## 2019-12-26 ENCOUNTER — Other Ambulatory Visit: Payer: Self-pay

## 2019-12-26 DIAGNOSIS — L570 Actinic keratosis: Secondary | ICD-10-CM

## 2019-12-26 DIAGNOSIS — Z85828 Personal history of other malignant neoplasm of skin: Secondary | ICD-10-CM | POA: Diagnosis not present

## 2019-12-26 DIAGNOSIS — D229 Melanocytic nevi, unspecified: Secondary | ICD-10-CM

## 2019-12-26 DIAGNOSIS — D225 Melanocytic nevi of trunk: Secondary | ICD-10-CM | POA: Diagnosis not present

## 2019-12-26 DIAGNOSIS — Z79899 Other long term (current) drug therapy: Secondary | ICD-10-CM | POA: Diagnosis not present

## 2019-12-26 LAB — BASIC METABOLIC PANEL
BUN/Creatinine Ratio: 17 (ref 10–24)
BUN: 21 mg/dL (ref 8–27)
CO2: 24 mmol/L (ref 20–29)
Calcium: 10.3 mg/dL — ABNORMAL HIGH (ref 8.6–10.2)
Chloride: 102 mmol/L (ref 96–106)
Creatinine, Ser: 1.27 mg/dL (ref 0.76–1.27)
GFR calc Af Amer: 61 mL/min/{1.73_m2} (ref >59)
GFR calc non Af Amer: 53 mL/min/{1.73_m2} — ABNORMAL LOW (ref >59)
Glucose: 102 mg/dL — ABNORMAL HIGH (ref 65–99)
Potassium: 5.3 mmol/L — ABNORMAL HIGH (ref 3.5–5.2)
Sodium: 142 mmol/L (ref 134–144)

## 2019-12-26 NOTE — Patient Instructions (Addendum)
Mr. Point returns today with a chief complaint of noticing a spot on his right ear which seems to be spontaneously disappearing.  Joe knows that if this recurs or grows or bleeds that I will be happy to see him again.  General skin examination waist up otherwise shows no sign of new or recurrent skin cancer or any atypical moles.  On the frontal hairline and the left arm are a handful of horny crusts that represent precancers.  These were treated with liquid nitrogen freeze.  He can expect that these will swell and will likely peel off in the next 2 weeks.  If he sees anything in less than a year that concerns him I will be happy to see him again.

## 2019-12-30 ENCOUNTER — Other Ambulatory Visit: Payer: Self-pay

## 2019-12-30 DIAGNOSIS — Z79899 Other long term (current) drug therapy: Secondary | ICD-10-CM

## 2019-12-30 NOTE — Progress Notes (Signed)
Minus Breeding, MD  12/28/2019 10:01 PM EDT    Creat is OK. Potassium is mildly elevated but lower than previous. OK to keep taking the spironolactone. However, I would like for him to get another BMET in one month. Call Mr. Knaff with the results and send results to [name], MD

## 2020-01-01 ENCOUNTER — Encounter: Payer: Self-pay | Admitting: Dermatology

## 2020-01-01 NOTE — Progress Notes (Addendum)
   Follow-Up Visit   Subjective  TANO MARCUM is a 79 y.o. male who presents for the following: Skin Problem (here to check on patients forehead/scalp. Patient also had spot on nose was crusty but the crust fell off. ).  Crusts Location: Face and left arm Duration: Avril months Quality:  Associated Signs/Symptoms: Modifying Factors:  Severity:  Timing: Context: History of multiple nonmelanoma skin cancers  The following portions of the chart were reviewed this encounter and updated as appropriate: Tobacco  Allergies  Meds  Problems  Med Hx  Surg Hx  Fam Hx      Objective  Well appearing patient in no apparent distress; mood and affect are within normal limits.  All skin waist up examined.   Assessment & Plan  AK (actinic keratosis) (5) Head - Anterior (Face); Left Forearm - Posterior (4)  Destruction of lesion - Head - Anterior (Face), Left Forearm - Posterior  Destruction method: cryotherapy   Informed consent: discussed and consent obtained   Timeout:  patient name, date of birth, surgical site, and procedure verified Lesion destroyed using liquid nitrogen: Yes   Region frozen until ice ball extended beyond lesion: Yes   Cryotherapy cycles:  5 Outcome: patient tolerated procedure well with no complications    Nevus Mid Back  Personal history of skin cancer Mid Frontal Scalp

## 2020-01-09 MED ORDER — HYDRALAZINE HCL 10 MG PO TABS: 10.0000 mg | ORAL_TABLET | Freq: Two times a day (BID) | ORAL | 12 refills | Status: DC

## 2020-01-09 NOTE — Telephone Encounter (Signed)
The spironolactone does not seem like it is working. With the increased creat and potassium let us just stop the spironolactone and start hydralazine 10 mg bid po. Disp number 60 with 12 refills.

## 2020-01-26 DIAGNOSIS — Z79899 Other long term (current) drug therapy: Secondary | ICD-10-CM | POA: Diagnosis not present

## 2020-01-26 LAB — BASIC METABOLIC PANEL
BUN/Creatinine Ratio: 17 (ref 10–24)
BUN: 20 mg/dL (ref 8–27)
CO2: 24 mmol/L (ref 20–29)
Calcium: 9.7 mg/dL (ref 8.6–10.2)
Chloride: 104 mmol/L (ref 96–106)
Creatinine, Ser: 1.19 mg/dL (ref 0.76–1.27)
GFR calc Af Amer: 66 mL/min/{1.73_m2} (ref >59)
GFR calc non Af Amer: 57 mL/min/{1.73_m2} — ABNORMAL LOW (ref >59)
Glucose: 95 mg/dL (ref 65–99)
Potassium: 5.3 mmol/L — ABNORMAL HIGH (ref 3.5–5.2)
Sodium: 143 mmol/L (ref 134–144)

## 2020-01-31 ENCOUNTER — Telehealth: Payer: Self-pay

## 2020-01-31 DIAGNOSIS — Z79899 Other long term (current) drug therapy: Secondary | ICD-10-CM

## 2020-01-31 DIAGNOSIS — I10 Essential (primary) hypertension: Secondary | ICD-10-CM

## 2020-01-31 DIAGNOSIS — I2510 Atherosclerotic heart disease of native coronary artery without angina pectoris: Secondary | ICD-10-CM

## 2020-01-31 MED ORDER — HYDRALAZINE HCL 10 MG PO TABS: 20.0000 mg | ORAL_TABLET | Freq: Two times a day (BID) | ORAL | 11 refills | Status: DC

## 2020-01-31 MED ORDER — LISINOPRIL 20 MG PO TABS: 20.0000 mg | ORAL_TABLET | Freq: Every day | ORAL | 3 refills | Status: DC

## 2020-01-31 NOTE — Telephone Encounter (Signed)
-----   Message from Minus Breeding, MD sent at 01/30/2020  4:58 PM EDT ----- I called and spoke with the patient.  Potassium is mildly elevated.  I am going reduce lisinopril to 20 mg daily and repeat BMET in one month.  I will increase the hydralazine to 20 mg bid.  He is no longer on the spironolactone.  We will update the med list.

## 2020-01-31 NOTE — Telephone Encounter (Signed)
Medication list updated and scripts sent to pharmacy. BMP order placed and lab slips mailed to patient.

## 2020-03-05 DIAGNOSIS — R351 Nocturia: Secondary | ICD-10-CM | POA: Diagnosis not present

## 2020-03-05 DIAGNOSIS — R3121 Asymptomatic microscopic hematuria: Secondary | ICD-10-CM | POA: Diagnosis not present

## 2020-03-05 DIAGNOSIS — N5201 Erectile dysfunction due to arterial insufficiency: Secondary | ICD-10-CM | POA: Diagnosis not present

## 2020-03-05 DIAGNOSIS — N401 Enlarged prostate with lower urinary tract symptoms: Secondary | ICD-10-CM | POA: Diagnosis not present

## 2020-03-05 MED ORDER — HYDRALAZINE HCL 10 MG PO TABS: 20.0000 mg | ORAL_TABLET | Freq: Two times a day (BID) | ORAL | 11 refills | Status: DC

## 2020-03-12 ENCOUNTER — Encounter: Payer: Self-pay | Admitting: Internal Medicine

## 2020-03-12 ENCOUNTER — Other Ambulatory Visit: Payer: Self-pay

## 2020-03-12 ENCOUNTER — Ambulatory Visit (INDEPENDENT_AMBULATORY_CARE_PROVIDER_SITE_OTHER): Payer: PPO | Admitting: Internal Medicine

## 2020-03-12 VITALS — BP 150/90 | HR 52 | Temp 98.5°F | Resp 16 | Ht 66.0 in | Wt 173.0 lb

## 2020-03-12 DIAGNOSIS — E785 Hyperlipidemia, unspecified: Secondary | ICD-10-CM

## 2020-03-12 DIAGNOSIS — E875 Hyperkalemia: Secondary | ICD-10-CM | POA: Insufficient documentation

## 2020-03-12 DIAGNOSIS — T50905A Adverse effect of unspecified drugs, medicaments and biological substances, initial encounter: Secondary | ICD-10-CM

## 2020-03-12 DIAGNOSIS — N1832 Chronic kidney disease, stage 3b: Secondary | ICD-10-CM | POA: Diagnosis not present

## 2020-03-12 DIAGNOSIS — I2510 Atherosclerotic heart disease of native coronary artery without angina pectoris: Secondary | ICD-10-CM

## 2020-03-12 DIAGNOSIS — Z0000 Encounter for general adult medical examination without abnormal findings: Secondary | ICD-10-CM | POA: Diagnosis not present

## 2020-03-12 DIAGNOSIS — I10 Essential (primary) hypertension: Secondary | ICD-10-CM | POA: Diagnosis not present

## 2020-03-12 NOTE — Patient Instructions (Signed)

## 2020-03-12 NOTE — Progress Notes (Signed)
Subjective:  Patient ID: [name], male    DOB: Sep 06, 1940  Age: 79 y.o. MRN: [id number]  CC: Hypertension and Annual Exam  This visit occurred during the SARS-CoV-2 public health emergency.  Safety protocols were in place, including screening questions prior to the visit, additional usage of staff PPE, and extensive cleaning of exam room while observing appropriate contact time as indicated for disinfecting solutions.    HPI [name] presents for a CPX.  He tells me his blood pressure has been well controlled.  He is active and denies any recent episodes of chest pain, nearsyncope, or shortness of breath.  He tells me his cardiologist has been adjusting his antihypertensives because initially his potassium level was low and more recently the potassium level has been elevated.  He denies palpitations, dizziness, lightheadedness, DOE, muscle weakness, muscle twitches, or muscle cramping.    Outpatient Medications Prior to Visit  Medication Sig Dispense Refill  . atenolol (TENORMIN) 50 MG tablet TAKE 1 TABLET BY MOUTH TWICE A DAY 180 tablet 1  . atorvastatin (LIPITOR) 20 MG tablet Take 1 tablet (20 mg total) by mouth daily. 90 tablet 3  . Coenzyme Q10 (CO Q 10 PO) Take 1 tablet by mouth daily.    . hydrALAZINE (APRESOLINE) 10 MG tablet Take 2 tablets (20 mg total) by mouth in the morning and at bedtime. 120 tablet 11  . KRILL OIL PO Take 1 tablet by mouth daily.    Marland Kitchen lisinopril (ZESTRIL) 20 MG tablet Take 1 tablet (20 mg total) by mouth daily. 90 tablet 3  . Multiple Vitamins-Minerals (CENTRUM SILVER 50+MEN PO) Take 1 tablet by mouth.    . nitroGLYCERIN (NITROSTAT) 0.4 MG SL tablet Place 1 tablet (0.4 mg total) under the tongue every 5 (five) minutes as needed. 25 tablet 1  . pantoprazole (PROTONIX) 40 MG tablet TAKE 1 TABLET BY MOUTH EVERY DAY 90 tablet 1  . primidone (MYSOLINE) 50 MG tablet Take 1 tablet (50 mg total) by mouth 2 (two) times daily. 180 tablet 3  . verapamil  (CALAN-SR) 180 MG CR tablet TAKE 1 TABLET BY MOUTH EVERY DAY 90 tablet 1  . acetaminophen (TYLENOL) 500 MG tablet Take 500 mg by mouth every 6 (six) hours as needed.    Marland Kitchen aspirin 81 MG EC tablet Take 81 mg by mouth daily.       No facility-administered medications prior to visit.    ROS Review of Systems  Constitutional: Negative.  Negative for diaphoresis, fatigue and unexpected weight change.  HENT: Negative.   Eyes: Negative.  Negative for visual disturbance.  Respiratory: Negative for cough, chest tightness, shortness of breath and wheezing.   Cardiovascular: Negative for chest pain, palpitations and leg swelling.  Gastrointestinal: Negative for abdominal pain, diarrhea, nausea and vomiting.  Endocrine: Negative.   Genitourinary: Negative.  Negative for difficulty urinating.  Musculoskeletal: Negative.  Negative for arthralgias and myalgias.  Skin: Negative.  Negative for color change, pallor and rash.  Neurological: Negative.  Negative for dizziness, seizures, weakness, light-headedness, numbness and headaches.  Hematological: Negative for adenopathy. Does not bruise/bleed easily.  Psychiatric/Behavioral: Negative.     Objective:  BP (!) 150/90 (BP Location: Left Arm, Patient Position: Sitting, Cuff Size: Large)   Pulse (!) 52   Temp 98.5 F (36.9 C) (Oral)   Resp 16   Ht 5\' 6"  (1.676 m)   Wt 173 lb (78.5 kg)   SpO2 97%   BMI 27.92 kg/m   BP Readings  from Last 3 Encounters:  03/12/20 (!) 150/90  12/07/19 (!) 156/92  10/20/19 (!) 142/88    Wt Readings from Last 3 Encounters:  03/12/20 173 lb (78.5 kg)  12/07/19 176 lb 3.2 oz (79.9 kg)  10/20/19 171 lb 3.2 oz (77.7 kg)    Physical Exam Vitals reviewed.  Constitutional:      Appearance: Normal appearance.  HENT:     Nose: Nose normal.     Mouth/Throat:     Mouth: Mucous membranes are moist.  Eyes:     General: No scleral icterus.    Conjunctiva/sclera: Conjunctivae normal.  Cardiovascular:     Rate and  Rhythm: Regular rhythm. Bradycardia present.  No extrasystoles are present.    Heart sounds: Normal heart sounds, S1 normal and S2 normal. No murmur heard.  No gallop.      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 - ?? New Q in aVF Normal T waves. Pulmonary:     Effort: Pulmonary effort is normal.     Breath sounds: No stridor. No wheezing, rhonchi or rales.  Abdominal:     General: Abdomen is flat.     Palpations: There is no mass.     Tenderness: There is no abdominal tenderness. There is no guarding.  Musculoskeletal:        General: Normal range of motion.     Cervical back: Neck supple.     Right lower leg: No edema.     Left lower leg: No edema.  Lymphadenopathy:     Cervical: No cervical adenopathy.  Skin:    General: Skin is warm and dry.     Coloration: Skin is not pale.  Neurological:     General: No focal deficit present.     Mental Status: He is alert and oriented to person, place, and time. Mental status is at baseline.  Psychiatric:        Mood and Affect: Mood normal.        Behavior: Behavior normal.     Lab Results  Component Value Date   WBC 7.9 03/12/2020   HGB 14.2 03/12/2020   HCT 42.9 03/12/2020   PLT 217 03/12/2020   GLUCOSE 95 03/12/2020   CHOL 124 03/12/2020   TRIG 121 03/12/2020   HDL 44 03/12/2020   LDLCALC 60 03/12/2020   ALT 17 03/12/2020   AST 20 03/12/2020   NA 141 03/12/2020   K 5.3 03/12/2020   CL 104 03/12/2020   CREATININE 1.26 (H) 03/12/2020   BUN 18 03/12/2020   CO2 32 03/12/2020   TSH 0.73 03/12/2020   PSA 2.62 02/15/2018    EXERCISE TOLERANCE TEST (ETT)  Result Date: 10/13/2018  Blood pressure demonstrated a normal response to exercise.  There was no ST segment deviation noted during stress.  No chest pain noted with stress  HR response was submaximal, reducing the sensitivity of this test to detect ischemia.     Assessment & Plan:   [name] was seen today for hypertension and  annual exam.  Diagnoses and all orders for this visit:  Drug-induced hyperkalemia- His potassium level is stable at 5.3.  I do not recommend any changes at this time. -     Basic metabolic panel; Future -     EKG 12-Lead -     Basic metabolic panel  Essential hypertension- Considering his age and comorbid illnesses his blood pressure is adequately well controlled.  Will continue the current dose of  the ACE inhibitor, beta-blocker, and calcium channel blocker. -     CBC with Differential/Platelet; Future -     Basic metabolic panel; Future -     EKG 12-Lead -     Basic metabolic panel -     CBC with Differential/Platelet  Coronary artery disease involving native coronary artery of native heart without angina pectoris- He has had no recent episodes of angina.  Will continue addressing risk factor modifications.  Stage 3b chronic kidney disease- His renal function is stable.  He will avoid nephrotoxic agents. -     CBC with Differential/Platelet; Future -     Basic metabolic panel; Future -     Urinalysis, Routine w reflex microscopic; Future -     Urinalysis, Routine w reflex microscopic -     Basic metabolic panel -     CBC with Differential/Platelet  Hyperlipidemia with target LDL less than 70- He has achieved his LDL goal and is doing well on the statin. -     Lipid panel; Future -     TSH; Future -     Hepatic function panel; Future -     Hepatic function panel -     TSH -     Lipid panel  Routine general medical examination at a health care facility- Exam completed, labs reviewed, vaccines reviewed and updated, no cancer screenings are indicated, patient education was given.   I have discontinued [name] "Joe"'s aspirin and acetaminophen. I am also having him maintain his nitroGLYCERIN, Multiple Vitamins-Minerals (CENTRUM SILVER 50+MEN PO), Coenzyme Q10 (CO Q 10 PO), KRILL OIL PO, primidone, atorvastatin, verapamil, atenolol, pantoprazole, lisinopril, and  hydrALAZINE.  No orders of the defined types were placed in this encounter.  In addition to time spent on CPE, I spent 50 minutes in preparing to see the patient by review of recent labs, imaging and procedures, obtaining and reviewing separately obtained history, communicating with the patient and family or caregiver, ordering medications, tests or procedures, and documenting clinical information in the EHR including the differential Dx, treatment, and any further evaluation and other management of 1. Drug-induced hyperkalemia 2. Essential hypertension 3. Coronary artery disease involving native coronary artery of native heart without angina pectoris 4. Stage 3b chronic kidney disease 5. Hyperlipidemia with target LDL less than 70    Follow-up: Return in about 6 months (around 09/12/2020).  Scarlette Calico, MD

## 2020-03-13 ENCOUNTER — Encounter: Payer: Self-pay | Admitting: Internal Medicine

## 2020-03-13 LAB — HEPATIC FUNCTION PANEL
AG Ratio: 1.7 (calc) (ref 1.0–2.5)
ALT: 17 U/L (ref 9–46)
AST: 20 U/L (ref 10–35)
Albumin: 4.3 g/dL (ref 3.6–5.1)
Alkaline phosphatase (APISO): 63 U/L (ref 35–144)
Bilirubin, Direct: 0.1 mg/dL (ref 0.0–0.2)
Globulin: 2.6 g/dL (calc) (ref 1.9–3.7)
Indirect Bilirubin: 0.4 mg/dL (calc) (ref 0.2–1.2)
Total Bilirubin: 0.5 mg/dL (ref 0.2–1.2)
Total Protein: 6.9 g/dL (ref 6.1–8.1)

## 2020-03-13 LAB — BASIC METABOLIC PANEL
BUN/Creatinine Ratio: 14 (calc) (ref 6–22)
BUN: 18 mg/dL (ref 7–25)
CO2: 32 mmol/L (ref 20–32)
Calcium: 10.1 mg/dL (ref 8.6–10.3)
Chloride: 104 mmol/L (ref 98–110)
Creat: 1.26 mg/dL — ABNORMAL HIGH (ref 0.70–1.11)
Glucose, Bld: 95 mg/dL (ref 65–99)
Potassium: 5.3 mmol/L (ref 3.5–5.3)
Sodium: 141 mmol/L (ref 135–146)

## 2020-03-13 LAB — CBC WITH DIFFERENTIAL/PLATELET
Absolute Monocytes: 640 cells/uL (ref 200–950)
Basophils Absolute: 63 cells/uL (ref 0–200)
Basophils Relative: 0.8 %
Eosinophils Absolute: 182 cells/uL (ref 15–500)
Eosinophils Relative: 2.3 %
HCT: 42.9 % (ref 38.5–50.0)
Hemoglobin: 14.2 g/dL (ref 13.2–17.1)
Lymphs Abs: 2789 cells/uL (ref 850–3900)
MCH: 31.9 pg (ref 27.0–33.0)
MCHC: 33.1 g/dL (ref 32.0–36.0)
MCV: 96.4 fL (ref 80.0–100.0)
MPV: 10.7 fL (ref 7.5–12.5)
Monocytes Relative: 8.1 %
Neutro Abs: 4227 cells/uL (ref 1500–7800)
Neutrophils Relative %: 53.5 %
Platelets: 217 10*3/uL (ref 140–400)
RBC: 4.45 10*6/uL (ref 4.20–5.80)
RDW: 12.4 % (ref 11.0–15.0)
Total Lymphocyte: 35.3 %
WBC: 7.9 10*3/uL (ref 3.8–10.8)

## 2020-03-13 LAB — URINALYSIS, ROUTINE W REFLEX MICROSCOPIC
Bilirubin Urine: NEGATIVE
Glucose, UA: NEGATIVE
Hgb urine dipstick: NEGATIVE
Ketones, ur: NEGATIVE
Leukocytes,Ua: NEGATIVE
Nitrite: NEGATIVE
Protein, ur: NEGATIVE
Specific Gravity, Urine: 1.006 (ref 1.001–1.03)
pH: 6.5 (ref 5.0–8.0)

## 2020-03-13 LAB — LIPID PANEL
Cholesterol: 124 mg/dL (ref <200)
HDL: 44 mg/dL (ref >=40)
LDL Cholesterol (Calc): 60 mg/dL (calc)
Non-HDL Cholesterol (Calc): 80 mg/dL (calc) (ref <130)
Total CHOL/HDL Ratio: 2.8 (calc) (ref <5.0)
Triglycerides: 121 mg/dL (ref <150)

## 2020-03-13 LAB — TSH: TSH: 0.73 mIU/L (ref 0.40–4.50)

## 2020-03-14 ENCOUNTER — Other Ambulatory Visit: Payer: Self-pay | Admitting: Internal Medicine

## 2020-03-14 DIAGNOSIS — K2270 Barrett's esophagus without dysplasia: Secondary | ICD-10-CM

## 2020-03-14 DIAGNOSIS — K219 Gastro-esophageal reflux disease without esophagitis: Secondary | ICD-10-CM

## 2020-03-14 DIAGNOSIS — I10 Essential (primary) hypertension: Secondary | ICD-10-CM

## 2020-03-14 DIAGNOSIS — I2510 Atherosclerotic heart disease of native coronary artery without angina pectoris: Secondary | ICD-10-CM

## 2020-03-14 MED ORDER — PANTOPRAZOLE SODIUM 40 MG PO TBEC: 40.0000 mg | DELAYED_RELEASE_TABLET | Freq: Every day | ORAL | 1 refills | Status: DC

## 2020-03-14 MED ORDER — NITROGLYCERIN 0.4 MG SL SUBL: 0.4000 mg | SUBLINGUAL_TABLET | SUBLINGUAL | 1 refills | Status: DC | PRN

## 2020-03-14 MED ORDER — VERAPAMIL HCL ER 180 MG PO TBCR: 180.0000 mg | EXTENDED_RELEASE_TABLET | Freq: Every day | ORAL | 1 refills | Status: DC

## 2020-03-14 MED ORDER — ATENOLOL 50 MG PO TABS: 50.0000 mg | ORAL_TABLET | Freq: Two times a day (BID) | ORAL | 1 refills | Status: DC

## 2020-04-19 ENCOUNTER — Ambulatory Visit: Payer: Self-pay

## 2020-04-27 ENCOUNTER — Telehealth: Payer: Self-pay | Admitting: Internal Medicine

## 2020-04-27 NOTE — Progress Notes (Signed)
  Chronic Care Management   Note  04/27/2020 Name: [name] MRN: [id number] DOB: [birth year]  [name] is a 79 y.o. year old male who is a primary care patient of [name], MD. I reached out to [name] by phone today in response to a referral sent by [name] Heather's PCP, [name], MD.   [name] was given information about Chronic Care Management services today including:  1. CCM service includes personalized support from designated clinical staff supervised by his physician, including individualized plan of care and coordination with other care providers 2. 24/7 contact phone numbers for assistance for urgent and routine care needs. 3. Service will only be billed when office clinical staff spend 20 minutes or more in a month to coordinate care. 4. Only one practitioner may furnish and bill the service in a calendar month. 5. The patient may stop CCM services at any time (effective at the end of the month) by phone call to the office staff.   Patient agreed to services and verbal consent obtained.   Follow up plan:   [name] Upstream Scheduler

## 2020-05-06 ENCOUNTER — Other Ambulatory Visit: Payer: Self-pay | Admitting: Internal Medicine

## 2020-05-06 DIAGNOSIS — I2510 Atherosclerotic heart disease of native coronary artery without angina pectoris: Secondary | ICD-10-CM

## 2020-05-10 MED ORDER — HYDRALAZINE HCL 25 MG PO TABS: 25.0000 mg | ORAL_TABLET | Freq: Two times a day (BID) | ORAL | 3 refills | Status: DC

## 2020-05-13 NOTE — Progress Notes (Addendum)
Patient ID: [name]                 DOB: 1941-02-13                      MRN: [id number]     HPI: [name] is a 79 y.o. male referred by Dr. [name] to HTN clinic. PMH is significant for HTN, HLD, CAD with Hx MI s/p multiple PCIs, 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, essential tremors followed by neurology.  He was last seen by Dr. [name] on 12/07/19 where BP was elevated at 156/92 and HR 89. His HCTZ and K-Dur was stopped and he was started on spironolactone. Follow-up labs showed increased Scr and K and pt reported SBP in 140-150s. Spironolactone was switched to hydralazine which controlled BP initially. However, BP started to trend up so dose was increased to 25 mg BID this past Friday.  Patient arrives today in good spirits for initial visit. He reports improvement in BP since increasing hydralazine dose this past Friday, but home BP still elevated with SBP ranging 140-160s. Takes hydralazine and atenolol at 9am/9pm, takes verapamil in the morning and lisinopril at night. He tends to eat out at restaurants regularly for lunch but has been working on drinking more water instead of diet pepsi. Has not been exercising regularly due to warm temperatures but is interested in resuming his walks with the lowering temperature.  Current HTN meds: lisinopril 20 mg daily, verapamil 180 mg daily, atenolol 50 mg BID, hydralazine 25 mg BID  Previously tried: diltiazem 180 mg daily, HCTZ 25 mg daily, ramipril 10 mg BID, spironolactone 25 mg daily (increased Scr/K)  BP goal: <130/80 mmHg  Family History: patient unsure  Social History: 4 ounces of red wine every 2-3 nights  Diet: does not add salt to food. Eats toast, bowl of cereal, or eggs for breakfast. Eats out at restaurants for lunch at [place], [employer], [employer], [place] in the evening (nectarines, apples, ice cream, sliced [place]). Drinking more water (used to drink diet pepsi), 1 cup of half decaf  coffee.  Exercise: used to walk routinely 3x weekly but not recently due to warm temperature  Home BP readings: SBP 140-160s, DBP 80s, HR 50s 142/79, 148/83, 152/83, 161/88, 148/76  Labs: 03/12/20: Scr 1.26, K 5.3, Na 141, LDL 60, TG 121 01/26/20: Scr 1.19, K 5.3, Na 143  Wt Readings from Last 3 Encounters:  03/12/20 173 lb (78.5 kg)  12/07/19 176 lb 3.2 oz (79.9 kg)  10/20/19 171 lb 3.2 oz (77.7 kg)   BP Readings from Last 3 Encounters:  03/12/20 (!) 150/90  12/07/19 (!) 156/92  10/20/19 (!) 142/88   Pulse Readings from Last 3 Encounters:  03/12/20 (!) 52  12/07/19 89  10/20/19 (!) 47    Renal function: CrCl cannot be calculated (Patient's most recent lab result is older than the maximum 21 days allowed.).  Past Medical History:  Diagnosis Date  . Anemia   . Barrett's esophagus   . Blood transfusion without reported diagnosis   . CAD (coronary artery disease)    2002 stenting of the RCA,, 2006 Stenting of diag with DES, PTCA of Circ, 50% LAD  . Cancer ([place])    skin cancer scalp and forehead  . 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 Diverticulosis   . DJD (degenerative  joint disease)   . Duodenal ulcer   . ED (erectile dysfunction)   . GERD (gastroesophageal reflux disease)    increased s/s in the last week 03-22-19 PV   . Hemorrhoids   . Hyperlipidemia   . Hypertension   . Myocardial infarction ([place])    2002  . 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   mid frontal scalp-[id number]  . SCC (squamous cell carcinoma) 06/15/2019   front middle scalp-tx [id number]  . SCC (squamous cell carcinoma) 12/27/2002   CIS-right angle of jaw--cx4fu  . SCC (squamous cell carcinoma) 09/24/2004   CIS--mid post crown, left mis crown medial,left first web space-tx cx6fu  . SCC (squamous cell carcinoma) 06/03/2006   SCCA-right temple-tx cx73fu  . SCC (squamous cell carcinoma) 06/03/2006   CIS-right forehead, left mis crown scalp  medial-tx cx59fu  . 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/30/2006   right temple lateral-txpbx  . SCC (squamous cell carcinoma) 11/10/2006   left forehead, left hand proximal  . 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/02/2009   left scalp-cx14fu  . SCC (squamous cell carcinoma) 01/24/2015   left scalp-cx63fu  . SCC (squamous cell carcinoma) 11/12/2015   front lateralscalp-cx75fu, medial scalp-cx29fu  . SCC (squamous cell carcinoma) 12/17/2016   front center scalp-cx48fu  . Squamous cell carcinoma of skin 08/10/2017   CIS--right ear rim, top of scalp, left side scalp-tx-cx66fu    Current Outpatient Medications on File Prior to Visit  Medication Sig Dispense Refill  . atenolol (TENORMIN) 50 MG tablet Take 1 tablet (50 mg total) by mouth 2 (two) times daily. 180 tablet 1  . atorvastatin (LIPITOR) 20 MG tablet Take 1 tablet (20 mg total) by mouth daily. 90 tablet 3  . Coenzyme Q10 (CO Q 10 PO) Take 1 tablet by mouth daily.    . hydrALAZINE (APRESOLINE) 25 MG tablet Take 1 tablet (25 mg total) by mouth in the morning and at bedtime. 60 tablet 3  . KRILL OIL PO Take 1 tablet by mouth daily.    Marland Kitchen lisinopril (ZESTRIL) 20 MG tablet Take 1 tablet (20 mg total) by mouth daily. 90 tablet 3  . Multiple Vitamins-Minerals (CENTRUM SILVER 50+MEN PO) Take 1 tablet by mouth.    . nitroGLYCERIN (NITROSTAT) 0.4 MG SL tablet PLACE 1 TABLET (0.4 MG TOTAL) UNDER THE TONGUE EVERY 5 (FIVE) MINUTES AS NEEDED. 25 tablet 1  . pantoprazole (PROTONIX) 40 MG tablet Take 1 tablet (40 mg total) by mouth daily. 90 tablet 1  . primidone (MYSOLINE) 50 MG tablet Take 1 tablet (50 mg total) by mouth 2 (two) times daily. 180 tablet 3  . verapamil (CALAN-SR) 180 MG CR tablet Take 1 tablet (180 mg total) by mouth daily. 90 tablet 1  . [DISCONTINUED] nitroGLYCERIN (NITROSTAT) 0.4 MG SL tablet Place 1 tablet (0.4 mg total) under the tongue every 5 (five) minutes as needed. 25 tablet 1   No current facility-administered medications on file  prior to visit.    No Known Allergies   Assessment/Plan:  1. Hypertension - BP remains elevated and above goal of <130/80 mmHg. Stop verapamil and start amlodipine 5 mg daily for better BP control. Continue lisinopril 20 mg daily, hydralazine 25 mg BID, and atenolol 50 mg BID. Can consider increasing amlodipine dose, increasing hydralazine to TID dosing, or changing atenolol to carvedilol at next visit. Reviewed proper BP measuring technique with patient. Educated on the importance of regular exercise and maintaining a cardiac-healthy diet that is low in sodium. Patient is willing to increase his daily  activity and resume his regular walks. Scheduled 2-week follow-up on 05/28/20.  Richardine Service, PharmD PGY2 Cardiology Pharmacy Resident

## 2020-05-14 ENCOUNTER — Other Ambulatory Visit: Payer: Self-pay

## 2020-05-14 ENCOUNTER — Ambulatory Visit (INDEPENDENT_AMBULATORY_CARE_PROVIDER_SITE_OTHER): Payer: PPO | Admitting: Pharmacist

## 2020-05-14 VITALS — BP 140/78 | HR 73

## 2020-05-14 DIAGNOSIS — I10 Essential (primary) hypertension: Secondary | ICD-10-CM

## 2020-05-14 MED ORDER — AMLODIPINE BESYLATE 5 MG PO TABS: 5.0000 mg | ORAL_TABLET | Freq: Every day | ORAL | 11 refills | Status: DC

## 2020-05-14 NOTE — Patient Instructions (Addendum)
It was nice to meet you today   Your blood pressure goal is less than 130/11mmHg  STOP taking verapamil  START taking amlodipine 5 mg daily   Continue taking your lisinopril 20 mg daily, atenolol 50 mg twice daily, and hydralazine 25 mg twice daily.   Please continue to monitor your blood pressure at home. Bring your readings to your next appointment.  Please call [phone] if you have any questions.

## 2020-05-26 NOTE — Progress Notes (Addendum)
Patient ID: [name]                 DOB: [birth year]                      MRN: [id number]     HPI: [name] is a 79 y.o. male referred by Dr. [name] to HTN clinic. PMH is significant for HTN, HLD, CAD with Hx MI s/p multiple PCIs, 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, essential tremors followed by neurology.  He was last seen by Dr. [name] on 12/07/19 where BP was elevated at 156/92 and HR 89. His HCTZ and K-Dur was stopped and he was started on spironolactone. Follow-up labs showed increased Scr and K and pt reported SBP in 140-150s. Spironolactone was switched to hydralazine which controlled BP initially. However, BP started to trend up so dose was increased to 25 mg BID this past Friday.  At last visit on 05/14/20, BP was elevated but improved at 140/78, HR 73. His home BP were still elevated with SBP ranging 140-160s. He reported taking hydralazine and atenolol at 9am/9pm, verapamil in the morning, and lisinopril at night. He was not exercising regularly due to warm temperatures but was interested in resuming his walks with the lowering temperature. Verapamil was switched to amlodipine 5 mg daily at this visit.  Patient arrives in good spirits for follow-up visit. He is tolerating amlodipine well and denies any LEE. His home BP readings are trending down ranging 120-130s/70s with HR 50s and he is please with this trend. His BP is higher in the mornings compared to the afternoon/evenings and wonders if adjustments need to be made to evening medications. He has one isolated elevated BP reading at 181/101 that was taken prior to taking his AM BP medications. Subsequent readings were better controlled at 145/81 and 139/75 after taking BP medications. He has not started going to the gym and has not restarted his daily walks but reports that it will be a top priority for him in the next few weeks.  Current HTN meds: lisinopril 20 mg daily, atenolol 50 mg BID, hydralazine 25 mg BID, amlodipine 5 mg  daily  Previously tried: diltiazem 180 mg daily, HCTZ 25 mg daily, ramipril 10 mg BID, spironolactone 25 mg daily (increased Scr/K), verapamil 180 mg daily (ineffective)  BP goal: <130/80 mmHg  Family History: patient unsure  Social History: 4 ounces of red wine every 2-3 nights  Diet: does not add salt to food. Eats toast, bowl of cereal, or eggs for breakfast. Eats out at restaurants for lunch at [place], [employer], [employer], [place] in the evening (nectarines, apples, ice cream, sliced [place]). Drinking more water (used to drink diet pepsi), 1 cup of half decaf coffee.  Exercise: used to walk routinely 3x weekly but not recently due to warm temperature  Home BP readings: Down-trending and improved since last visit. SBP ranges 120-130s, DBP 70s, HR 50s (139/75, 134/74, 132/71, 126/75, 126/77).  - One isolated BP 181/101 prior to AM meds > decreased to 139/75 after medications.  Labs: 03/12/20: Scr 1.26, K 5.3, Na 141, LDL 60, TG 121 01/26/20: Scr 1.19, K 5.3, Na 143  Wt Readings from Last 3 Encounters:  03/12/20 173 lb (78.5 kg)  12/07/19 176 lb 3.2 oz (79.9 kg)  10/20/19 171 lb 3.2 oz (77.7 kg)   BP Readings from Last 3 Encounters:  05/14/20 140/78  03/12/20 (!) 150/90  12/07/19 Marland Kitchen)  156/92   Pulse Readings from Last 3 Encounters:  05/14/20 73  03/12/20 (!) 52  12/07/19 89    Renal function: CrCl cannot be calculated (Patient's most recent lab result is older than the maximum 21 days allowed.).  Past Medical History:  Diagnosis Date  . Anemia   . Barrett's esophagus   . Blood transfusion without reported diagnosis   . CAD (coronary artery disease)    2002 stenting of the RCA,, 2006 Stenting of diag with DES, PTCA of Circ, 50% LAD  . Cancer ([place])    skin cancer scalp and forehead  . 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 Diverticulosis   . DJD (degenerative joint disease)   . Duodenal  ulcer   . ED (erectile dysfunction)   . GERD (gastroesophageal reflux disease)    increased s/s in the last week 03-22-19 PV   . Hemorrhoids   . Hyperlipidemia   . Hypertension   . Myocardial infarction ([place])    2002  . 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   mid frontal scalp-cx50fu  . SCC (squamous cell carcinoma) 06/15/2019   front middle scalp-tx cx57fu  . SCC (squamous cell carcinoma) 12/27/2002   CIS-right angle of jaw--cx3fu  . SCC (squamous cell carcinoma) 09/24/2004   CIS--mid post crown, left mis crown medial,left first web space-tx cx34fu  . SCC (squamous cell carcinoma) 06/03/2006   SCCA-right temple-tx cx72fu  . SCC (squamous cell carcinoma) 06/03/2006   CIS-right forehead, left mis crown scalp medial-tx cx30fu  . 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/30/2006   right temple lateral-txpbx  . SCC (squamous cell carcinoma) 11/10/2006   left forehead, left hand proximal  . 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/02/2009   left scalp-cx46fu  . SCC (squamous cell carcinoma) 01/24/2015   left scalp-cx79fu  . SCC (squamous cell carcinoma) 11/12/2015   front lateralscalp-cx86fu, medial scalp-cx75fu  . SCC (squamous cell carcinoma) 12/17/2016   front center scalp-cx56fu  . Squamous cell carcinoma of skin 08/10/2017   CIS--right ear rim, top of scalp, left side scalp-tx-cx60fu    Current Outpatient Medications on File Prior to Visit  Medication Sig Dispense Refill  . amLODipine (NORVASC) 5 MG tablet Take 1 tablet (5 mg total) by mouth daily. 30 tablet 11  . atenolol (TENORMIN) 50 MG tablet Take 1 tablet (50 mg total) by mouth 2 (two) times daily. 180 tablet 1  . atorvastatin (LIPITOR) 20 MG tablet Take 1 tablet (20 mg total) by mouth daily. 90 tablet 3  . Coenzyme Q10 (CO Q 10 PO) Take 1 tablet by mouth daily.    . hydrALAZINE (APRESOLINE) 25 MG tablet Take 1 tablet (25 mg total) by mouth in the morning and at bedtime. 60 tablet 3  . KRILL OIL PO Take 1 tablet by mouth daily.      Marland Kitchen lisinopril (ZESTRIL) 20 MG tablet Take 1 tablet (20 mg total) by mouth daily. 90 tablet 3  . Multiple Vitamins-Minerals (CENTRUM SILVER 50+MEN PO) Take 1 tablet by mouth.    . nitroGLYCERIN (NITROSTAT) 0.4 MG SL tablet PLACE 1 TABLET (0.4 MG TOTAL) UNDER THE TONGUE EVERY 5 (FIVE) MINUTES AS NEEDED. 25 tablet 1  . pantoprazole (PROTONIX) 40 MG tablet Take 1 tablet (40 mg total) by mouth daily. 90 tablet 1  . primidone (MYSOLINE) 50 MG tablet Take 1 tablet (50 mg total) by mouth 2 (two) times daily. 180 tablet 3  . [DISCONTINUED]  nitroGLYCERIN (NITROSTAT) 0.4 MG SL tablet Place 1 tablet (0.4 mg total) under the tongue every 5 (five) minutes as needed. 25 tablet 1   No current facility-administered medications on file prior to visit.    No Known Allergies   Assessment/Plan:  1. Hypertension - BP is now controlled and below goal of <130/80 mmHg, but home BP readings are still mostly above goal. Increase amlodipine to 10 mg daily in the morning. Continue lisinopril 20 mg daily, hydralazine 25 mg BID, and atenolol 50 mg BID. Can consider increasing hydralazine to TID dosing or changing atenolol to carvedilol at next visit if home BP significantly above goal. Reviewed proper BP measuring technique with patient. Educated on the importance of regular exercise and maintaining a cardiac-healthy diet that is low in sodium. Patient is planning to start going to the gym again. Scheduled 2-week follow-up on 06/11/20.  Richardine Service, PharmD PGY2 Cardiology Pharmacy Resident

## 2020-05-28 ENCOUNTER — Other Ambulatory Visit: Payer: Self-pay

## 2020-05-28 ENCOUNTER — Ambulatory Visit (INDEPENDENT_AMBULATORY_CARE_PROVIDER_SITE_OTHER): Payer: PPO | Admitting: Pharmacist

## 2020-05-28 DIAGNOSIS — I10 Essential (primary) hypertension: Secondary | ICD-10-CM | POA: Diagnosis not present

## 2020-05-28 MED ORDER — AMLODIPINE BESYLATE 10 MG PO TABS: 10.0000 mg | ORAL_TABLET | Freq: Every day | ORAL | 11 refills | Status: DC

## 2020-05-28 NOTE — Patient Instructions (Addendum)
It was nice to see you today!   Your blood pressure goal is less than 130/61mmHg  INCREASE amlodipine to 10 mg daily. You can take two 5 mg tablets daily until your current supply runs out.   Continue taking your lisinopril 20 mg daily, atenolol 50 mg twice daily, hydralazine 25 mg twice daily.    Please continue to monitor your blood pressure at home. Bring your readings and your home cuff to your next appointment.

## 2020-05-29 ENCOUNTER — Other Ambulatory Visit: Payer: Self-pay | Admitting: Internal Medicine

## 2020-05-29 DIAGNOSIS — I2510 Atherosclerotic heart disease of native coronary artery without angina pectoris: Secondary | ICD-10-CM

## 2020-06-01 ENCOUNTER — Other Ambulatory Visit: Payer: Self-pay | Admitting: Internal Medicine

## 2020-06-01 DIAGNOSIS — I2510 Atherosclerotic heart disease of native coronary artery without angina pectoris: Secondary | ICD-10-CM

## 2020-06-10 NOTE — Progress Notes (Signed)
Cardiology Office Note   Date:  06/12/2020   ID:  [name], DOB 06/11/1941, MRN [id number]  PCP:  [name], MD  Cardiologist:   Minus Breeding, MD   Chief Complaint  Patient presents with   Hypertension      History of Present Illness: [name] is a 79 y.o. male who presents for follow up of his CAD.  Since I last saw him he has had adjustments to his blood pressure medicines to the list below and has done quite well with this.  He is very pleased and brings a blood pressure diary.  His blood pressure is well controlled.  He is tolerating the medications. The patient denies any new symptoms such as chest discomfort, neck or arm discomfort. There has been no new shortness of breath, PND or orthopnea. There have been no reported palpitations, presyncope or syncope.    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 - decreased dose of HCTZ to M,F   Diverticulosis    DJD (degenerative joint disease)    Duodenal ulcer    ED (erectile dysfunction)    GERD (gastroesophageal reflux disease)    increased s/s in the last week 03-22-19 PV    Hemorrhoids    Hyperlipidemia    Hypertension    Myocardial infarction ([place])    2002   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   mid frontal scalp-[id number]   SCC (squamous cell carcinoma) 06/15/2019   front middle scalp-tx cx83fu   SCC (squamous cell carcinoma) 12/27/2002   CIS-right angle of jaw--cx9fu   SCC (squamous cell carcinoma) 09/24/2004   CIS--mid post crown, left mis crown medial,left first web space-tx cx41fu   SCC (squamous cell carcinoma) 06/03/2006   SCCA-right temple-tx cx67fu   SCC  (squamous cell carcinoma) 06/03/2006   CIS-right forehead, left mis crown scalp medial-tx cx63fu   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/30/2006   right temple lateral-txpbx   SCC (squamous cell carcinoma) 11/10/2006   left forehead, left hand proximal   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/02/2009   left scalp-cx63fu   SCC (squamous cell carcinoma) 01/24/2015   left scalp-cx38fu   SCC (squamous cell carcinoma) 11/12/2015   front lateralscalp-cx61fu, medial scalp-cx7fu   SCC (squamous cell carcinoma) 12/17/2016   front center scalp-cx62fu   Squamous cell carcinoma of skin 08/10/2017   CIS--right ear rim, top of scalp, left side scalp-tx-cx49fu    Past Surgical History:  Procedure Laterality Date   COLONOSCOPY     ELBOW ARTHROSCOPY     heart stents     2002,2004   SPINAL FUSION     SPINAL FUSION     SPINE SURGERY     cervical   UPPER GASTROINTESTINAL ENDOSCOPY       Current Outpatient Medications  Medication Sig Dispense Refill   amLODipine (NORVASC) 10 MG tablet Take 1 tablet (10 mg total) by mouth daily. 30 tablet 11   atenolol (TENORMIN) 50 MG tablet Take 1 tablet (50 mg total) by mouth 2 (two) times daily. 180 tablet 1   atorvastatin (LIPITOR) 20 MG tablet Take  1 tablet (20 mg total) by mouth daily. 90 tablet 3   Coenzyme Q10 (CO Q 10 PO) Take 1 tablet by mouth daily.     hydrALAZINE (APRESOLINE) 25 MG tablet Take 1 tablet (25 mg total) by mouth in the morning and at bedtime. 60 tablet 3   KRILL OIL PO Take 1 tablet by mouth daily.     lisinopril (ZESTRIL) 20 MG tablet Take 1 tablet (20 mg total) by mouth daily. 90 tablet 3   Multiple Vitamins-Minerals (CENTRUM SILVER 50+MEN PO) Take 1 tablet by mouth.     nitroGLYCERIN (NITROSTAT) 0.4 MG SL tablet PLACE 1 TABLET (0.4 MG TOTAL) UNDER THE TONGUE EVERY 5 (FIVE) MINUTES AS NEEDED. 75 tablet 1   pantoprazole (PROTONIX) 40 MG tablet Take 1 tablet (40 mg total) by mouth daily. 90 tablet 1   primidone  (MYSOLINE) 50 MG tablet Take 1 tablet (50 mg total) by mouth 2 (two) times daily. 180 tablet 3   No current facility-administered medications for this visit.    Allergies:   Patient has no known allergies.    ROS:  Please see the history of present illness.   Otherwise, review of systems are positive for none.   All other systems are reviewed and negative.    PHYSICAL EXAM: VS:  BP 128/62 (BP Location: Left Arm, Patient Position: Sitting, Cuff Size: Normal)    Pulse (!) 55    Ht 5\' 6"  (1.676 m)    Wt 176 lb (79.8 kg)    BMI 28.41 kg/m  , BMI Body mass index is 28.41 kg/m.  GENERAL:  Well appearing NECK:  No jugular venous distention, waveform within normal limits, carotid upstroke brisk and symmetric, no bruits, no thyromegaly LUNGS:  Clear to auscultation bilaterally CHEST:  Unremarkable HEART:  PMI not displaced or sustained,S1 and S2 within normal limits, no S3, no S4, no clicks, no rubs, no murmurs ABD:  Flat, positive bowel sounds normal in frequency in pitch, no bruits, no rebound, no guarding, no midline pulsatile mass, no hepatomegaly, no splenomegaly EXT:  2 plus pulses throughout, no edema, no cyanosis no clubbing.    EKG:  EKG is not ordered today. NA   Recent Labs: 03/12/2020: ALT 17; BUN 18; Creat 1.26; Hemoglobin 14.2; Platelets 217; Potassium 5.3; Sodium 141; TSH 0.73    Lipid Panel    Component Value Date/Time   CHOL 124 03/12/2020 1144   TRIG 121 03/12/2020 1144   TRIG 161 (H) 08/28/2006 1023   HDL 44 03/12/2020 1144   CHOLHDL 2.8 03/12/2020 1144   VLDL 20.2 03/10/2019 1032   LDLCALC 60 03/12/2020 1144      Wt Readings from Last 3 Encounters:  06/12/20 176 lb (79.8 kg)  03/12/20 173 lb (78.5 kg)  12/07/19 176 lb 3.2 oz (79.9 kg)      Other studies Reviewed: Additional studies/ records that were reviewed today include: NA. Review of the above records demonstrates:  See elsewhere    ASSESSMENT AND PLAN:   CORONARY ARTERY DISEASE, S/P PTCA -    The patient has no new sypmtoms.  No further cardiovascular testing is indicated.  We will continue with aggressive risk reduction and meds as listed.  HYPERLIPIDEMIA -  His LDL was 60 at the last check in July.  No change in therapy.   HYPERTENSION -  The blood pressure is at target.  Continue current therapy. am going to add spironolactone 25 mg daily.  I do note that he has some  renal insufficiency.  I plan to watch his creatinine very carefully when checking it next week.  Check in no longer than 1 month after that it is stable and will plan to follow a few times per year.  COVID EDUCATION:   He has had his vaccine.  We talked about the booster.     Current medicines are reviewed at length with the patient today.  The patient does not have concerns regarding medicines.  The following changes have been made:  no change  Labs/ tests ordered today include: None  No orders of the defined types were placed in this encounter.    Disposition:   FU with me in 12 months.     Signed, Minus Breeding, MD  06/12/2020 2:48 PM    [place]

## 2020-06-11 ENCOUNTER — Other Ambulatory Visit: Payer: Self-pay

## 2020-06-11 ENCOUNTER — Ambulatory Visit (INDEPENDENT_AMBULATORY_CARE_PROVIDER_SITE_OTHER): Payer: PPO | Admitting: Pharmacist

## 2020-06-11 VITALS — BP 122/78 | HR 55

## 2020-06-11 DIAGNOSIS — I10 Essential (primary) hypertension: Secondary | ICD-10-CM | POA: Diagnosis not present

## 2020-06-11 NOTE — Progress Notes (Addendum)
Patient ID: [name]                 DOB: 18-Feb-1941                      MRN: [id number]     HPI: [name] is a 79 y.o. male referred by Dr. [name] to HTN clinic. 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  He was last seen by Dr. [name] on 12/07/19 where BP was elevated at 156/92 and HR 89. His HCTZ and K-Dur was stopped and he was started on spironolactone. Follow-up labs showed increased Scr and K and pt reported SBP in 140-150s. Spironolactone was switched to hydralazine which controlled BP initially. However, BP started to trend up so dose was increased to 25 mg BID.  On 05/14/20, BP was elevated but improved at 140/78, HR 73. His home BP were still elevated with SBP ranging 140-160s. He reported taking hydralazine and atenolol at 9am/9pm, verapamil in the morning, and lisinopril at night. He was not exercising regularly due to warm temperatures but was interested in resuming his walks with the lowering temperature. Verapamil was switched to amlodipine 5 mg daily at this visit.  At last visit on 05/28/20, his BP in office was at goal at 120/68, HR 60, however, his home BP readings were slightly above goal with SBP ranging 120-130s with one isolated BP reading of 181/101 prior to AM medications, so his amlodipine was increased to 10 mg.  Patient arrives in good spirits today for follow-up visit. He is very pleased with his BP readings since increasing his amlodipine dose. His home BP readings are controlled with SBP ranging 110-120s and HR in 50s. He is tolerating the amlodipine well and denies any dizziness, HA, or LEE. He continues to limit his sodium intake and has started taking walks with his wife each morning. He was out of town at a conference last week and reports walking 9,000-12,000 steps each day. He would like to maintain this increased activity through his daily  walks.  Current HTN meds: lisinopril 20 mg daily, atenolol 50 mg BID, hydralazine 25 mg BID, amlodipine 10 mg daily  Previously tried: diltiazem 180 mg daily, HCTZ 25 mg daily, ramipril 10 mg BID, spironolactone 25 mg daily (increased Scr/K), verapamil 180 mg daily (ineffective)  BP goal: <130/80 mmHg  Family History: patient unsure  Social History: 4 ounces of red wine every 2-3 nights  Diet: does not add salt to food. Eats toast, bowl of cereal, or eggs for breakfast. Eats out at restaurants for lunch at [place], [employer], [employer], [place] in the evening (nectarines, apples, ice cream, sliced [place]). Drinking more water (used to drink diet pepsi), 1 cup of half decaf coffee.  Exercise: used to walk routinely 3x weekly but not recently due to warm temperature  Home BP readings: Improved since last visit - SBP ranging 110-120s, DBP 60-70s, HR 50s (BP 123/75, 127/72, 119/66, 128/71, 119/64, 131/79, 118/63)  Labs: 03/12/20: Scr 1.26, K 5.3, Na 141, LDL 60, TG 121 01/26/20: Scr 1.19, K 5.3, Na 143  Wt Readings from Last 3 Encounters:  03/12/20 173 lb (78.5 kg)  12/07/19 176 lb 3.2 oz (79.9 kg)  10/20/19 171 lb 3.2 oz (77.7 kg)   BP Readings from Last 3 Encounters:  05/28/20 120/68  05/14/20 140/78  03/12/20 (!) 150/90   Pulse Readings from Last 3 Encounters:  05/28/20 60  05/14/20 73  03/12/20 (!) 52    Renal function: CrCl cannot be calculated (Patient's most recent lab result is older than the maximum 21 days allowed.).  Past Medical History:  Diagnosis Date  . Anemia   . Barrett's esophagus   . Blood transfusion without reported diagnosis   . CAD (coronary artery disease)    2002 stenting of the RCA,, 2006 Stenting of diag with DES, PTCA of Circ, 50% LAD  . Cancer ([place])    skin cancer scalp and forehead  . 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 Diverticulosis   . DJD (degenerative joint  disease)   . Duodenal ulcer   . ED (erectile dysfunction)   . GERD (gastroesophageal reflux disease)    increased s/s in the last week 03-22-19 PV   . Hemorrhoids   . Hyperlipidemia   . Hypertension   . Myocardial infarction ([place])    2002  . 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   mid frontal scalp-cx4fu  . SCC (squamous cell carcinoma) 06/15/2019   front middle scalp-tx cx69fu  . SCC (squamous cell carcinoma) 12/27/2002   CIS-right angle of jaw--cx23fu  . SCC (squamous cell carcinoma) 09/24/2004   CIS--mid post crown, left mis crown medial,left first web space-tx cx75fu  . SCC (squamous cell carcinoma) 06/03/2006   SCCA-right temple-tx cx60fu  . SCC (squamous cell carcinoma) 06/03/2006   CIS-right forehead, left mis crown scalp medial-tx cx67fu  . 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/30/2006   right temple lateral-txpbx  . SCC (squamous cell carcinoma) 11/10/2006   left forehead, left hand proximal  . 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/02/2009   left scalp-cx51fu  . SCC (squamous cell carcinoma) 01/24/2015   left scalp-cx65fu  . SCC (squamous cell carcinoma) 11/12/2015   front lateralscalp-cx78fu, medial scalp-cx41fu  . SCC (squamous cell carcinoma) 12/17/2016   front center scalp-cx67fu  . Squamous cell carcinoma of skin 08/10/2017   CIS--right ear rim, top of scalp, left side scalp-tx-cx94fu    Current Outpatient Medications on File Prior to Visit  Medication Sig Dispense Refill  . amLODipine (NORVASC) 10 MG tablet Take 1 tablet (10 mg total) by mouth daily. 30 tablet 11  . atenolol (TENORMIN) 50 MG tablet Take 1 tablet (50 mg total) by mouth 2 (two) times daily. 180 tablet 1  . atorvastatin (LIPITOR) 20 MG tablet Take 1 tablet (20 mg total) by mouth daily. 90 tablet 3  . Coenzyme Q10 (CO Q 10 PO) Take 1 tablet by mouth daily.    . hydrALAZINE (APRESOLINE) 25 MG tablet Take 1 tablet (25 mg total) by mouth in the morning and at bedtime. 60 tablet 3  . KRILL OIL PO Take 1  tablet by mouth daily.    Marland Kitchen lisinopril (ZESTRIL) 20 MG tablet Take 1 tablet (20 mg total) by mouth daily. 90 tablet 3  . Multiple Vitamins-Minerals (CENTRUM SILVER 50+MEN PO) Take 1 tablet by mouth.    . nitroGLYCERIN (NITROSTAT) 0.4 MG SL tablet PLACE 1 TABLET (0.4 MG TOTAL) UNDER THE TONGUE EVERY 5 (FIVE) MINUTES AS NEEDED. 75 tablet 1  . pantoprazole (PROTONIX) 40 MG tablet Take 1 tablet (40 mg total) by mouth daily. 90 tablet 1  . primidone (MYSOLINE) 50 MG tablet Take 1 tablet (50 mg total) by mouth 2 (two) times daily. 180 tablet 3  . [  DISCONTINUED] nitroGLYCERIN (NITROSTAT) 0.4 MG SL tablet Place 1 tablet (0.4 mg total) under the tongue every 5 (five) minutes as needed. 25 tablet 1   No current facility-administered medications on file prior to visit.    No Known Allergies   Assessment/Plan:  1. Hypertension - BP is now controlled and below goal of <130/80 mmHg both in office and at home. Continue lisinopril 20 mg daily, hydralazine 25 mg BID, and atenolol 50 mg BID, and amlodipine 10 mg daily. Can consider increasing hydralazine to TID dosing or changing atenolol to carvedilol 25 mg BID if BP becomes uncontrolled again. Reviewed proper BP measuring technique with patient. Discussed the importance of regular exercise and maintaining a cardiac-healthy diet that is low in sodium. Given his well-controlled BP, patient does not need further follow-up with HTN clinic. Next follow-up appointment scheduled with Dr. [name] on 06/12/20.  Richardine Service, PharmD PGY2 Cardiology Pharmacy Resident

## 2020-06-11 NOTE — Patient Instructions (Addendum)
It was nice to see you again today!   Your blood pressure goal is less than 130/34mmHg   Continue taking your lisinopril 20 mg daily, atenolol 50 mg twice daily, hydralazine 25 mg twice daily, and amlodipine 10 mg daily.    Please continue to monitor your blood pressure at home.  Please call us at [phone] if you have any questions.

## 2020-06-12 ENCOUNTER — Ambulatory Visit: Payer: PPO | Admitting: Cardiology

## 2020-06-12 ENCOUNTER — Encounter: Payer: Self-pay | Admitting: Cardiology

## 2020-06-12 VITALS — BP 128/62 | HR 55 | Ht 66.0 in | Wt 176.0 lb

## 2020-06-12 DIAGNOSIS — I10 Essential (primary) hypertension: Secondary | ICD-10-CM | POA: Diagnosis not present

## 2020-06-12 DIAGNOSIS — I2510 Atherosclerotic heart disease of native coronary artery without angina pectoris: Secondary | ICD-10-CM | POA: Diagnosis not present

## 2020-06-12 DIAGNOSIS — E785 Hyperlipidemia, unspecified: Secondary | ICD-10-CM

## 2020-06-12 DIAGNOSIS — Z7189 Other specified counseling: Secondary | ICD-10-CM | POA: Diagnosis not present

## 2020-06-12 NOTE — Patient Instructions (Signed)
Medication Instructions:  No changes *If you need a refill on your cardiac medications before your next appointment, please call your pharmacy*   Lab Work: None ordered If you have labs (blood work) drawn today and your tests are completely normal, you will receive your results only by: . MyChart Message (if you have MyChart) OR . A paper copy in the mail If you have any lab test that is abnormal or we need to change your treatment, we will call you to review the results.   Testing/Procedures: None ordered   Follow-Up: At [facility], you and your health needs are our priority.  As part of our continuing mission to provide you with exceptional heart care, we have created designated Provider Care Teams.  These Care Teams include your primary Cardiologist (physician) and Advanced Practice Providers (APPs -  Physician Assistants and Nurse Practitioners) who all work together to provide you with the care you need, when you need it.  We recommend signing up for the patient portal called "MyChart".  Sign up information is provided on this After Visit Summary.  MyChart is used to connect with patients for Virtual Visits (Telemedicine).  Patients are able to view lab/test results, encounter notes, upcoming appointments, etc.  Non-urgent messages can be sent to your provider as well.   To learn more about what you can do with MyChart, go to [email or web address].    Your next appointment:   12 month(s)  The format for your next appointment:   In Person  Provider:   You may see James Hochrein, MD or one of the following Advanced Practice Providers on your designated Care Team:    Rhonda Barrett, PA-C  Kathryn Lawrence, DNP, ANP   

## 2020-06-14 ENCOUNTER — Other Ambulatory Visit: Payer: Self-pay

## 2020-06-14 ENCOUNTER — Ambulatory Visit (INDEPENDENT_AMBULATORY_CARE_PROVIDER_SITE_OTHER): Payer: PPO

## 2020-06-14 ENCOUNTER — Other Ambulatory Visit: Payer: Self-pay | Admitting: Internal Medicine

## 2020-06-14 DIAGNOSIS — I2510 Atherosclerotic heart disease of native coronary artery without angina pectoris: Secondary | ICD-10-CM

## 2020-06-14 DIAGNOSIS — Z23 Encounter for immunization: Secondary | ICD-10-CM | POA: Diagnosis not present

## 2020-06-28 ENCOUNTER — Other Ambulatory Visit: Payer: Self-pay | Admitting: Internal Medicine

## 2020-06-28 DIAGNOSIS — I2510 Atherosclerotic heart disease of native coronary artery without angina pectoris: Secondary | ICD-10-CM

## 2020-07-03 ENCOUNTER — Other Ambulatory Visit (HOSPITAL_BASED_OUTPATIENT_CLINIC_OR_DEPARTMENT_OTHER): Payer: Self-pay | Admitting: Internal Medicine

## 2020-07-03 ENCOUNTER — Ambulatory Visit: Payer: PPO | Attending: Internal Medicine

## 2020-07-03 DIAGNOSIS — Z23 Encounter for immunization: Secondary | ICD-10-CM

## 2020-07-05 ENCOUNTER — Telehealth: Payer: Self-pay | Admitting: Pharmacist

## 2020-07-05 ENCOUNTER — Other Ambulatory Visit: Payer: Self-pay

## 2020-07-05 ENCOUNTER — Ambulatory Visit: Payer: PPO | Admitting: Pharmacist

## 2020-07-05 ENCOUNTER — Telehealth: Payer: Self-pay

## 2020-07-05 DIAGNOSIS — I2510 Atherosclerotic heart disease of native coronary artery without angina pectoris: Secondary | ICD-10-CM

## 2020-07-05 DIAGNOSIS — I10 Essential (primary) hypertension: Secondary | ICD-10-CM

## 2020-07-05 DIAGNOSIS — E785 Hyperlipidemia, unspecified: Secondary | ICD-10-CM

## 2020-07-05 NOTE — Patient Instructions (Addendum)
Visit Information  Phone number for Pharmacist: [phone]  Thank you for meeting with me to discuss your medications! I look forward to working with you to achieve your health care goals. Below is a summary of what we talked about during the visit:  Goals Addressed            This Visit's Progress   . Pharmacy Care Plan       CARE PLAN ENTRY (see longitudinal plan of care for additional care plan information)  Current Barriers:  . Chronic Disease Management support, education, and care coordination needs related to Hypertension, Hyperlipidemia, and Coronary Artery Disease   Hypertension BP Readings from Last 3 Encounters:  06/12/20 128/62  06/11/20 122/78  05/28/20 120/68 .  Pharmacist Clinical Goal(s): o Over the next 180 days, patient will work with PharmD and providers to maintain BP goal <130/80 . Current regimen:  o Amlodipine 10 mg daily o Atenolol 50 mg twice a day o Hydralazine 25 mg twice a day o Lisinopril 20 mg daily . Interventions: o Discussed BP goals and benefits of medications for prevention of heart attack / stroke . Patient self care activities - Over the next 180 days, patient will: o Check BP daily, document, and provide at future appointments o Ensure daily salt intake < 2300 mg/day  Hyperlipidemia / CAD Lab Results  Component Value Date/Time   LDLCALC 60 03/12/2020 11:44 AM .  Pharmacist Clinical Goal(s): o Over the next 180 days, patient will work with PharmD and providers to maintain LDL goal < 70 . Current regimen:  o Atorvastatin 20 mg daily o Nitroglycerin 0.4 mg SL prn o Coenzyme Q10 o Krill oil . Interventions: o Discussed cholesterol goals and benefits of medications for prevention of heart attack / stroke . Patient self care activities - Over the next 180 days, patient will: o Continue medications as directed  Medication management . Pharmacist Clinical Goal(s): o Over the next 180 days, patient will work with PharmD and  providers to achieve optimal medication adherence . [facility] . Interventions o Comprehensive medication review performed. o Utilize [facility] for medication synchronization, packaging and delivery . Patient self care activities - Over the next 180 days, patient will: o Focus on medication adherence by pill pack o Take medications as prescribed o Report any questions or concerns to PharmD and/or provider(s)  Initial goal documentation      [name] was given information about Chronic Care Management services today including:  1. CCM service includes personalized support from designated clinical staff supervised by his physician, including individualized plan of care and coordination with other care providers 2. 24/7 contact phone numbers for assistance for urgent and routine care needs. 3. Standard insurance, coinsurance, copays and deductibles apply for chronic care management only during months in which we provide at least 20 minutes of these services. Most insurances cover these services at 100%, however patients may be responsible for any copay, coinsurance and/or deductible if applicable. This service may help you avoid the need for more expensive face-to-face services. 4. Only one practitioner may furnish and bill the service in a calendar month. 5. The patient may stop CCM services at any time (effective at the end of the month) by phone call to the office staff.  Patient agreed to services and verbal consent obtained.   Patient verbalizes understanding of instructions provided today.  Telephone follow up appointment with pharmacy team member scheduled for: 6 months  Charlene Brooke, PharmD, [facility] Clinical Pharmacist  [facility] [phone]  Heart-Healthy Eating Plan Many factors influence your heart (coronary) health, including eating and exercise habits. Coronary risk increases with abnormal blood fat (lipid) levels.  Heart-healthy meal planning includes limiting unhealthy fats, increasing healthy fats, and making other diet and lifestyle changes. What is my plan? Your health care provider may recommend that you:  Limit your fat intake to _________% or less of your total calories each day.  Limit your saturated fat intake to _________% or less of your total calories each day.  Limit the amount of cholesterol in your diet to less than _________ mg per day. What are tips for following this plan? Cooking Cook foods using methods other than frying. Baking, boiling, grilling, and broiling are all good options. Other ways to reduce fat include:  Removing the skin from poultry.  Removing all visible fats from meats.  Steaming vegetables in water or broth. Meal planning   At meals, imagine dividing your plate into fourths: ? Fill one-half of your plate with vegetables and green salads. ? Fill one-fourth of your plate with whole grains. ? Fill one-fourth of your plate with lean protein foods.  Eat 4-5 servings of vegetables per day. One serving equals 1 cup raw or cooked vegetable, or 2 cups raw leafy greens.  Eat 4-5 servings of fruit per day. One serving equals 1 medium whole fruit,  cup dried fruit,  cup fresh, frozen, or canned fruit, or  cup 100% fruit juice.  Eat more foods that contain soluble fiber. Examples include apples, broccoli, carrots, beans, peas, and barley. Aim to get 25-30 g of fiber per day.  Increase your consumption of legumes, nuts, and seeds to 4-5 servings per week. One serving of dried beans or legumes equals  cup cooked, 1 serving of nuts is  cup, and 1 serving of seeds equals 1 tablespoon. Fats  Choose healthy fats more often. Choose monounsaturated and polyunsaturated fats, such as olive and canola oils, flaxseeds, walnuts, almonds, and seeds.  Eat more omega-3 fats. Choose salmon, mackerel, sardines, tuna, flaxseed oil, and ground flaxseeds. Aim to eat fish at least 2  times each week.  Check food labels carefully to identify foods with trans fats or high amounts of saturated fat.  Limit saturated fats. These are found in animal products, such as meats, butter, and cream. Plant sources of saturated fats include palm oil, palm kernel oil, and coconut oil.  Avoid foods with partially hydrogenated oils in them. These contain trans fats. Examples are stick margarine, some tub margarines, cookies, crackers, and other baked goods.  Avoid fried foods. General information  Eat more home-cooked food and less restaurant, buffet, and fast food.  Limit or avoid alcohol.  Limit foods that are high in starch and sugar.  Lose weight if you are overweight. Losing just 5-10% of your body weight can help your overall health and prevent diseases such as diabetes and heart disease.  Monitor your salt (sodium) intake, especially if you have high blood pressure. Talk with your health care provider about your sodium intake.  Try to incorporate more vegetarian meals weekly. What foods can I eat? Fruits All fresh, canned (in natural juice), or frozen fruits. Vegetables Fresh or frozen vegetables (raw, steamed, roasted, or grilled). Green salads. Grains Most grains. Choose whole wheat and whole grains most of the time. Rice and pasta, including brown rice and pastas made with whole wheat. Meats and other proteins Lean, well-trimmed beef, veal, pork, and lamb. Chicken  and [place] without skin. All fish and shellfish. Wild duck, rabbit, pheasant, and venison. Egg whites or low-cholesterol egg substitutes. Dried beans, peas, lentils, and tofu. Seeds and most nuts. Dairy Low-fat or nonfat cheeses, including ricotta and mozzarella. Skim or 1% milk (liquid, powdered, or evaporated). Buttermilk made with low-fat milk. Nonfat or low-fat yogurt. Fats and oils Non-hydrogenated (trans-free) margarines. Vegetable oils, including soybean, sesame, sunflower, olive, peanut, safflower,  corn, canola, and cottonseed. Salad dressings or mayonnaise made with a vegetable oil. Beverages Water (mineral or sparkling). Coffee and tea. Diet carbonated beverages. Sweets and desserts Sherbet, gelatin, and fruit ice. Small amounts of dark chocolate. Limit all sweets and desserts. Seasonings and condiments All seasonings and condiments. The items listed above may not be a complete list of foods and beverages you can eat. Contact a dietitian for more options. What foods are not recommended? Fruits Canned fruit in heavy syrup. Fruit in cream or butter sauce. Fried fruit. Limit coconut. Vegetables Vegetables cooked in cheese, cream, or butter sauce. Fried vegetables. Grains Breads made with saturated or trans fats, oils, or whole milk. Croissants. Sweet rolls. Donuts. High-fat crackers, such as cheese crackers. Meats and other proteins Fatty meats, such as hot dogs, ribs, sausage, bacon, rib-eye roast or steak. High-fat deli meats, such as salami and bologna. Caviar. Domestic duck and goose. Organ meats, such as liver. Dairy Cream, sour cream, cream cheese, and creamed cottage cheese. Whole milk cheeses. Whole or 2% milk (liquid, evaporated, or condensed). Whole buttermilk. Cream sauce or high-fat cheese sauce. Whole-milk yogurt. Fats and oils Meat fat, or shortening. Cocoa butter, hydrogenated oils, palm oil, coconut oil, palm kernel oil. Solid fats and shortenings, including bacon fat, salt pork, lard, and butter. Nondairy cream substitutes. Salad dressings with cheese or sour cream. Beverages Regular sodas and any drinks with added sugar. Sweets and desserts Frosting. Pudding. Cookies. Cakes. Pies. Milk chocolate or white chocolate. Buttered syrups. Full-fat ice cream or ice cream drinks. The items listed above may not be a complete list of foods and beverages to avoid. Contact a dietitian for more information. Summary  Heart-healthy meal planning includes limiting unhealthy fats,  increasing healthy fats, and making other diet and lifestyle changes.  Lose weight if you are overweight. Losing just 5-10% of your body weight can help your overall health and prevent diseases such as diabetes and heart disease.  Focus on eating a balance of foods, including fruits and vegetables, low-fat or nonfat dairy, lean protein, nuts and legumes, whole grains, and heart-healthy oils and fats. This information is not intended to replace advice given to you by your health care provider. Make sure you discuss any questions you have with your health care provider. Document Revised: 09/18/2017 Document Reviewed: 09/18/2017 Elsevier Patient Education  2020 [employer].

## 2020-07-05 NOTE — Telephone Encounter (Signed)
-----   Message from Charlton Haws, [facility] sent at 07/05/2020  2:09 PM EST ----- Regarding: Med refills Mr Merlo is switching to [facility] for pill packs, can you order his refills?  Atenolol 50 mg Pantoprazole 40 mg

## 2020-07-05 NOTE — Progress Notes (Signed)
Chronic Care Management Pharmacy Assistant   Name: [name]  MRN: [id number] DOB: [birth year]  Reason for Encounter: Initial Questions Adherence Call   PCP : [name], MD  Allergies:  No Known Allergies  Medications: Outpatient Encounter Medications as of 07/05/2020  Medication Sig  . amLODipine (NORVASC) 10 MG tablet Take 1 tablet (10 mg total) by mouth daily.  Marland Kitchen atenolol (TENORMIN) 50 MG tablet Take 1 tablet (50 mg total) by mouth 2 (two) times daily.  Marland Kitchen atorvastatin (LIPITOR) 20 MG tablet Take 1 tablet (20 mg total) by mouth daily.  . Coenzyme Q10 (CO Q 10 PO) Take 1 tablet by mouth daily.  . hydrALAZINE (APRESOLINE) 25 MG tablet Take 1 tablet (25 mg total) by mouth in the morning and at bedtime.  Marland Kitchen KRILL OIL PO Take 1 tablet by mouth daily.  Marland Kitchen lisinopril (ZESTRIL) 20 MG tablet Take 1 tablet (20 mg total) by mouth daily.  . Multiple Vitamins-Minerals (CENTRUM SILVER 50+MEN PO) Take 1 tablet by mouth.  . nitroGLYCERIN (NITROSTAT) 0.4 MG SL tablet PLACE 1 TABLET UNDER THE TONGUE EVERY 5 MINUTES AS NEEDED.  Marland Kitchen pantoprazole (PROTONIX) 40 MG tablet Take 1 tablet (40 mg total) by mouth daily.  . primidone (MYSOLINE) 50 MG tablet Take 1 tablet (50 mg total) by mouth 2 (two) times daily.  . [DISCONTINUED] nitroGLYCERIN (NITROSTAT) 0.4 MG SL tablet Place 1 tablet (0.4 mg total) under the tongue every 5 (five) minutes as needed.  . [DISCONTINUED] nitroGLYCERIN (NITROSTAT) 0.4 MG SL tablet PLACE 1 TABLET (0.4 MG TOTAL) UNDER THE TONGUE EVERY 5 (FIVE) MINUTES AS NEEDED.  . [DISCONTINUED] nitroGLYCERIN (NITROSTAT) 0.4 MG SL tablet PLACE 1 TABLET UNDER THE TONGUE EVERY 5 MINUTES AS NEEDED.   No facility-administered encounter medications on file as of 07/05/2020.    Current Diagnosis: Patient Active Problem List   Diagnosis Date Noted  . Drug-induced hyperkalemia 03/12/2020  . Educated about COVID-19 virus infection 10/19/2019  . Coarse tremors 06/10/2016  . Routine  general medical examination at a health care facility 02/07/2016  . CAD (coronary artery disease) 05/01/2015  . CKD (chronic kidney disease), stage III ([place]) 05/01/2015  . 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 BARRETTS ESOPHAGUS 01/24/2010  . 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 Hyperlipidemia with target LDL less than 70 09/21/2007  . Chronic ischemic heart disease 09/21/2007  . ERECTILE DYSFUNCTION 03/09/2007  . Essential hypertension 03/09/2007    Goals Addressed   None     Follow-Up:  Pharmacist Review   Have you seen any other providers since your last visit? Yes, saw Dr. [name] the Cardiologist and been a few times to the hypertension clinic  Any changes in your medications or health? Yes the patient had amlodipine increased from 5 mg to 10 mg and his veramapril was discontinued  Any side effects from any medications? None  Do you have an symptoms or problems not managed by your medications? None  Any concerns about your health right now? None  Has your provider asked that you check blood pressure, blood sugar, or follow special diet at home? The patient's blood pressure is now running around the 120's and doing well during well now, modest changes in diet, being careful about what he eats  Do you get any type of exercise on a regular basis? The patient spends time walking about 10,000 steps a day  Can you think of a goal you would like to reach for your health? The  patient would like  to stay with current blood pressure and to stay healthy  Do you have any problems getting your medications? None   Is there anything that you would like to discuss during the appointment? None  Please bring medications and supplements to appointment   Wendy Poet, [place]

## 2020-07-05 NOTE — Chronic Care Management (AMB) (Signed)
Chronic Care Management Pharmacy  Name: [name]  MRN: [id number] DOB: 03-19-1941   Chief Complaint/ HPI  [name],  79 y.o. , male presents for their Initial CCM visit with the clinical pharmacist via telephone due to COVID-19 Pandemic.  PCP : Steven Lima, MD Patient Care Team: Steven Lima, MD as PCP - General (Internal Medicine) Steven Breeding, MD as PCP - Cardiology (Cardiology) Steven Monarch, MD as Consulting Physician (Dermatology) [name], Steven Quail, DO as Consulting Physician (Neurology) [name], [name] as Pharmacist (Pharmacist)   Patient lives with wife. Lives in [place] [employer] [place] for 30 years, moved to [place] almost 2 years ago. 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 His daughter also visits every Tuesday. He walks often with his wife.  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  Their chronic conditions include: Hypertension, Hyperlipidemia, Coronary Artery Disease, Chronic Kidney Disease, Osteoarthritis and BPH, Barrett's esophagus  Office Visits: 03/12/20 [name] OV: CPX; conditions stable, no med changes.  Consult Visit: 06/12/20 [name] (cardiology): f/u CAD. 06/11/20 [name] (cardiology): HTN clinic. BP at goal, no changes. 12/26/19 [name] (dermatology_ f/u for keratosis, hx of skin cancer. 10/18/19 [name] (neurology): f/u for essential tremor, doing very well, refilled primidone for 1 year, PCP can take over after.  No Known Allergies  Medications: Outpatient Encounter Medications as of 07/05/2020  Medication Sig  . atenolol (TENORMIN) 50 MG tablet Take 1 tablet (50 mg total) by mouth 2 (two) times daily.  Marland Kitchen atorvastatin (LIPITOR) 20 MG tablet Take 1 tablet (20 mg total) by mouth daily.  . Coenzyme Q10 (CO Q 10 PO) Take 1 tablet by mouth daily.  . hydrALAZINE (APRESOLINE) 25 MG tablet Take 1 tablet (25 mg total) by  mouth in the morning and at bedtime.  Marland Kitchen KRILL OIL PO Take 1 tablet by mouth daily.  Marland Kitchen lisinopril (ZESTRIL) 20 MG tablet Take 1 tablet (20 mg total) by mouth daily.  . Multiple Vitamins-Minerals (CENTRUM SILVER 50+MEN PO) Take 1 tablet by mouth.  . nitroGLYCERIN (NITROSTAT) 0.4 MG SL tablet PLACE 1 TABLET UNDER THE TONGUE EVERY 5 MINUTES AS NEEDED.  Marland Kitchen pantoprazole (PROTONIX) 40 MG tablet Take 1 tablet (40 mg total) by mouth daily.  . primidone (MYSOLINE) 50 MG tablet Take 1 tablet (50 mg total) by mouth 2 (two) times daily.  Marland Kitchen amLODipine (NORVASC) 10 MG tablet Take 1 tablet (10 mg total) by mouth daily.  . [DISCONTINUED] nitroGLYCERIN (NITROSTAT) 0.4 MG SL tablet Place 1 tablet (0.4 mg total) under the tongue every 5 (five) minutes as needed.  . [DISCONTINUED] nitroGLYCERIN (NITROSTAT) 0.4 MG SL tablet PLACE 1 TABLET (0.4 MG TOTAL) UNDER THE TONGUE EVERY 5 (FIVE) MINUTES AS NEEDED.  . [DISCONTINUED] nitroGLYCERIN (NITROSTAT) 0.4 MG SL tablet PLACE 1 TABLET UNDER THE TONGUE EVERY 5 MINUTES AS NEEDED.   No facility-administered encounter medications on file as of 07/05/2020.    Wt Readings from Last 3 Encounters:  06/12/20 176 lb (79.8 kg)  03/12/20 173 lb (78.5 kg)  12/07/19 176 lb 3.2 oz (79.9 kg)    Current Diagnosis/Assessment:  SDOH Interventions     Most Recent Value  SDOH Interventions  Financial Strain Interventions Intervention Not Indicated      Goals Addressed            This Visit's Progress   . Pharmacy Care Plan  CARE PLAN ENTRY (see longitudinal plan of care for additional care plan information)  Current Barriers:  . Chronic Disease Management support, education, and care coordination needs related to Hypertension, Hyperlipidemia, and Coronary Artery Disease   Hypertension BP Readings from Last 3 Encounters:  06/12/20 128/62  06/11/20 122/78  05/28/20 120/68 .  Pharmacist Clinical Goal(s): o Over the next 180 days, patient will work with [name] and  providers to maintain BP goal <130/80 . Current regimen:  o Amlodipine 10 mg daily o Atenolol 50 mg twice a day o Hydralazine 25 mg twice a day o Lisinopril 20 mg daily . Interventions: o Discussed BP goals and benefits of medications for prevention of heart attack / stroke . Patient self care activities - Over the next 180 days, patient will: o Check BP daily, document, and provide at future appointments o Ensure daily salt intake < 2300 mg/day  Hyperlipidemia / CAD Lab Results  Component Value Date/Time   LDLCALC 60 03/12/2020 11:44 AM .  Pharmacist Clinical Goal(s): o Over the next 180 days, patient will work with [name] and providers to maintain LDL goal < 70 . Current regimen:  o Atorvastatin 20 mg daily o Nitroglycerin 0.4 mg SL prn o Coenzyme Q10 o Krill oil . Interventions: o Discussed cholesterol goals and benefits of medications for prevention of heart attack / stroke . Patient self care activities - Over the next 180 days, patient will: o Continue medications as directed  Medication management . Pharmacist Clinical Goal(s): o Over the next 180 days, patient will work with [name] and providers to achieve optimal medication adherence . [facility] . Interventions o Comprehensive medication review performed. o Utilize [facility] pharmacy for medication synchronization, packaging and delivery . Patient self care activities - Over the next 180 days, patient will: o Focus on medication adherence by pill pack o Take medications as prescribed o Report any questions or concerns to [name] and/or provider(s)  Initial goal documentation       Hypertension   BP goal is:  <130/80  Office blood pressures are  BP Readings from Last 3 Encounters:  06/12/20 128/62  06/11/20 122/78  05/28/20 120/68   Lab Results  Component Value Date   CREATININE 1.26 (H) 03/12/2020   BUN 18 03/12/2020   GFR 58.75 (L) 03/10/2019   GFRNONAA 57 (L) 01/26/2020   GFRAA 66  01/26/2020   NA 141 03/12/2020   K 5.3 03/12/2020   CALCIUM 10.1 03/12/2020   CO2 32 03/12/2020   Patient checks BP at home daily Patient home BP readings are ranging: 110s-120s/60-70s  Patient has failed these meds in the past: diltiazem, HCTZ, ramipril, spironolactone, verapamil Patient is currently controlled on the following medications:  . Amlodipine 10 mg daily AM . Atenolol 50 mg BID . Hydralazine 25 mg BID . Lisinopril 20 mg daily PM  We discussed diet and exercise extensively; BP goals; pt has been to HTN clinic to adjust medications and current regimen achieved BP goal without side effects  Plan  Continue current medications and control with diet and exercise    Hyperlipidemia   LDL goal < 70 Hx CAD  Last lipids Lab Results  Component Value Date   CHOL 124 03/12/2020   HDL 44 03/12/2020   LDLCALC 60 03/12/2020   TRIG 121 03/12/2020   CHOLHDL 2.8 03/12/2020   Hepatic Function Latest Ref Rng & Units 03/12/2020 03/10/2019 03/08/2018  Total Protein 6.1 - 8.1 g/dL 6.9 7.1 7.2  Albumin 3.5 - 5.2  g/dL - 4.4 4.2  AST 10 - 35 U/L '20 18 21  ' ALT 9 - 46 U/L '17 16 18  ' Alk Phosphatase 39 - 117 U/L - 68 63  Total Bilirubin 0.2 - 1.2 mg/dL 0.5 0.6 0.4  Bilirubin, Direct 0.0 - 0.2 mg/dL 0.1 0.1 -    The ASCVD Risk score Mikey Bussing DC Jr., et al., 2013) failed to calculate for the following reasons:   The 2013 ASCVD risk score is only valid for ages 11 to 103   Patient has failed these meds in past: n/a Patient is currently controlled on the following medications:  . Atorvastatin 20 mg daily HS . Nitroglycerin 0.4 mg SL prn . Coenzyme Q10 200 mg daily AM . Krill oil AM  We discussed:  diet and exercise extensively; Cholesterol goals; benefits of statin for ASCVD risk reduction; never had to use NTG but does keep a supply on hand  Plan  Continue current medications and control with diet and exercise  GERD / Barrett's esophagus   Patient has failed these meds in past:  n/a Patient is currently controlled on the following medications:  . Pantoprazole 40 mg daily (since 2006)  We discussed:  Patient is satisfied with current regimen and denies issues; started taking PPI after GI bleed in ~2006;  tries to avoid eating too late in the evening to prevent GERD  Plan  Continue current medications and control with diet and exercise  Essential tremor   Patient has failed these meds in past: n/a Patient is currently controlled on the following medications:  . Primodone 50 mg BID  We discussed:  Pt reports tremors are well controlled; neurologist has said to follow up PRN  Plan  Continue current medications  Health Maintenance   Patient is currently controlled on the following medications:  Marland Kitchen Multivitamin (w/o potassium)  We discussed:  Patient is satisfied with current regimen and denies issues  Plan  Continue current medications   Medication Management   [facility] Uses pill box? No - sets up meds nightly for next day Pt endorses 100% compliance  We discussed: Verbal consent obtained for [facility] enhanced pharmacy services (medication synchronization, adherence packaging, delivery coordination). A medication sync plan was created to allow patient to get all medications delivered once every 30 to 90 days per patient preference. Patient understands they have freedom to choose pharmacy and clinical pharmacist will coordinate care between all prescribers and [facility].   Plan  Utilize [facility] pharmacy for medication synchronization, packaging and delivery    Follow up: 6 month phone visit  Charlene Brooke, [name], [facility] Clinical Pharmacist [facility] at [name] [phone]

## 2020-07-06 ENCOUNTER — Other Ambulatory Visit: Payer: Self-pay | Admitting: Internal Medicine

## 2020-07-06 DIAGNOSIS — I10 Essential (primary) hypertension: Secondary | ICD-10-CM

## 2020-07-06 DIAGNOSIS — K219 Gastro-esophageal reflux disease without esophagitis: Secondary | ICD-10-CM

## 2020-07-06 DIAGNOSIS — I2510 Atherosclerotic heart disease of native coronary artery without angina pectoris: Secondary | ICD-10-CM

## 2020-07-06 DIAGNOSIS — K2270 Barrett's esophagus without dysplasia: Secondary | ICD-10-CM

## 2020-07-06 MED ORDER — ATENOLOL 50 MG PO TABS: 50.0000 mg | ORAL_TABLET | Freq: Two times a day (BID) | ORAL | 1 refills | Status: DC

## 2020-07-06 MED ORDER — PANTOPRAZOLE SODIUM 40 MG PO TBEC: 40.0000 mg | DELAYED_RELEASE_TABLET | Freq: Every day | ORAL | 1 refills | Status: DC

## 2020-07-06 MED FILL — PFIZER-BIONTECH COVID-19 VA: 30 | 1 days supply | Qty: 0 | Fill #0

## 2020-07-09 ENCOUNTER — Other Ambulatory Visit: Payer: Self-pay | Admitting: Cardiology

## 2020-07-09 DIAGNOSIS — E785 Hyperlipidemia, unspecified: Secondary | ICD-10-CM

## 2020-07-09 DIAGNOSIS — I2510 Atherosclerotic heart disease of native coronary artery without angina pectoris: Secondary | ICD-10-CM

## 2020-07-09 DIAGNOSIS — I10 Essential (primary) hypertension: Secondary | ICD-10-CM

## 2020-07-09 NOTE — Telephone Encounter (Signed)
*  STAT* If patient is at the pharmacy, call can be transferred to refill team.   1. Which medications need to be refilled? (please list name of each medication and dose if known)  amLODipine (NORVASC) 10 MG tablet(Expired) atorvastatin (LIPITOR) 20 MG tablet hydrALAZINE (APRESOLINE) 25 MG tablet lisinopril (ZESTRIL) 20 MG tablet  2. Which pharmacy/location (including street and city if local pharmacy) is medication to be sent to? [facility] - [place], [place] - [place] [address]  3. Do they need a 30 day or 90 day supply? 3 with Refills  Pharmacy calling on behalf of the patient

## 2020-07-10 MED ORDER — HYDRALAZINE HCL 25 MG PO TABS
25.0000 mg | ORAL_TABLET | Freq: Two times a day (BID) | ORAL | 10 refills | Status: DC
Start: 2020-07-10 — End: 2020-08-03

## 2020-07-10 MED ORDER — LISINOPRIL 20 MG PO TABS
20.0000 mg | ORAL_TABLET | Freq: Every day | ORAL | 3 refills | Status: DC
Start: 2020-07-10 — End: 2021-05-07

## 2020-07-10 MED ORDER — AMLODIPINE BESYLATE 10 MG PO TABS: 10.0000 mg | ORAL_TABLET | Freq: Every day | ORAL | 11 refills | Status: DC

## 2020-07-10 MED ORDER — ATORVASTATIN CALCIUM 20 MG PO TABS: 20.0000 mg | ORAL_TABLET | Freq: Every day | ORAL | 3 refills | Status: DC

## 2020-08-03 ENCOUNTER — Other Ambulatory Visit: Payer: Self-pay | Admitting: Cardiology

## 2020-08-14 ENCOUNTER — Telehealth: Payer: Self-pay | Admitting: Pharmacist

## 2020-08-14 NOTE — Progress Notes (Signed)
Chronic Care Management Pharmacy Assistant   Name: [name]  MRN: [id number] DOB: August 11, 1941  Reason for Encounter: Medication Review  Patient Questions:  1.  Have you seen any other providers since your last visit? No  2.  Any changes in your medicines or health? No   PCP : [name], MD  Allergies:  No Known Allergies  Medications: Outpatient Encounter Medications as of 08/14/2020  Medication Sig  . amLODipine (NORVASC) 10 MG tablet Take 1 tablet (10 mg total) by mouth daily.  Marland Kitchen atenolol (TENORMIN) 50 MG tablet Take 1 tablet (50 mg total) by mouth 2 (two) times daily.  Marland Kitchen atorvastatin (LIPITOR) 20 MG tablet Take 1 tablet (20 mg total) by mouth daily.  . Coenzyme Q10 (CO Q 10 PO) Take 1 tablet by mouth daily.  . hydrALAZINE (APRESOLINE) 25 MG tablet TAKE 1 TABLET BY MOUTH IN THE MORNING AND AT BEDTIME.  Marland Kitchen KRILL OIL PO Take 1 tablet by mouth daily.  Marland Kitchen lisinopril (ZESTRIL) 20 MG tablet Take 1 tablet (20 mg total) by mouth daily.  . Multiple Vitamins-Minerals (CENTRUM SILVER 50+MEN PO) Take 1 tablet by mouth.  . nitroGLYCERIN (NITROSTAT) 0.4 MG SL tablet PLACE 1 TABLET UNDER THE TONGUE EVERY 5 MINUTES AS NEEDED.  Marland Kitchen pantoprazole (PROTONIX) 40 MG tablet Take 1 tablet (40 mg total) by mouth daily.  . primidone (MYSOLINE) 50 MG tablet Take 1 tablet (50 mg total) by mouth 2 (two) times daily.  . [DISCONTINUED] nitroGLYCERIN (NITROSTAT) 0.4 MG SL tablet Place 1 tablet (0.4 mg total) under the tongue every 5 (five) minutes as needed.  . [DISCONTINUED] nitroGLYCERIN (NITROSTAT) 0.4 MG SL tablet PLACE 1 TABLET (0.4 MG TOTAL) UNDER THE TONGUE EVERY 5 (FIVE) MINUTES AS NEEDED.  . [DISCONTINUED] nitroGLYCERIN (NITROSTAT) 0.4 MG SL tablet PLACE 1 TABLET UNDER THE TONGUE EVERY 5 MINUTES AS NEEDED.   No facility-administered encounter medications on file as of 08/14/2020.    Current Diagnosis: Patient Active Problem List   Diagnosis Date Noted  . Drug-induced hyperkalemia  03/12/2020  . Educated about COVID-19 virus infection 10/19/2019  . Coarse tremors 06/10/2016  . Routine general medical examination at a health care facility 02/07/2016  . CAD (coronary artery disease) 05/01/2015  . CKD (chronic kidney disease), stage III ([place]) 05/01/2015  . 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 BARRETTS ESOPHAGUS 01/24/2010  . 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 Hyperlipidemia with target LDL less than 70 09/21/2007  . Chronic ischemic heart disease 09/21/2007  . ERECTILE DYSFUNCTION 03/09/2007  . Essential hypertension 03/09/2007    Goals Addressed   None     Follow-Up:  Pharmacist Review  Reviewed chart for medication changes ahead of medication coordination call.  No OVs, Consults, or hospital visits since last care coordination call/Pharmacist visit. (If appropriate, list visit date, provider name)  No medication changes indicated OR if recent visit, treatment plan here.  BP Readings from Last 3 Encounters:  06/12/20 128/62  06/11/20 122/78  05/28/20 120/68    No results found for: HGBA1C   Patient obtains medications through Adherence Packaging  90 Days   Last adherence delivery included:  Atenolol 50 mg take 1 tab by mouth twice daily breakfast and bedtime Hydralazine 25 mg take 1 tab by mouth at breakfast Lisinopril 20 mg take 1 tab by mouth daily at bedtime Pantoprazole 40 mg take 1 tab by mouth at breakfast Amlodipine 10 mg take 1 tab by mouth at bedtime Atorvastatin 20 mg  take 1 tab by mouth at bedtime Primidone 50 mg take 1 tab by mouth at breakfast and bedtime  Patient is due for next adherence delivery on: 08/16/20. Called patient and reviewed medications and coordinated delivery.  This delivery to include: Atenolol 50 mg take 1 tab by mouth twice daily breakfast and bedtime Hydralazine 25 mg take 1 tab by mouth at breakfast Lisinopril 20 mg take 1 tab by mouth daily at bedtime Pantoprazole 40 mg  take 1 tab by mouth at breakfast Amlodipine 10 mg take 1 tab by mouth at bedtime Atorvastatin 20 mg take 1 tab by mouth at bedtime Primidone 50 mg take 1 tab by mouth at breakfast and bedtime  Confirmed delivery date of 08/16/20, advised patient that pharmacy will contact them the morning of delivery.   Wendy Poet, Clinical Pharmacist Assistant [facility]

## 2020-08-15 DIAGNOSIS — H353112 Nonexudative age-related macular degeneration, right eye, intermediate dry stage: Secondary | ICD-10-CM | POA: Diagnosis not present

## 2020-08-15 DIAGNOSIS — H40053 Ocular hypertension, bilateral: Secondary | ICD-10-CM | POA: Diagnosis not present

## 2020-08-15 DIAGNOSIS — H40013 Open angle with borderline findings, low risk, bilateral: Secondary | ICD-10-CM | POA: Diagnosis not present

## 2020-08-20 ENCOUNTER — Telehealth: Payer: Self-pay | Admitting: Internal Medicine

## 2020-08-20 NOTE — Telephone Encounter (Signed)
Copied from CRM [id number]. Topic: Medicare AWV >> Aug 20, 2020  3:48 PM Claudette Laws R wrote: Reason for CRM:   Left message for patient to call back and schedule Medicare Annual Wellness Visit (AWV) in office.   If not able to come in office, please offer to do virtually.  45 minute appointment   Last AWV 04/14/2019  Please schedule at anytime with the Nurse Health Advisor.

## 2020-09-12 ENCOUNTER — Telehealth: Payer: Self-pay | Admitting: Pharmacist

## 2020-09-12 NOTE — Progress Notes (Signed)
    Chronic Care Management Pharmacy Assistant   Name: RAYBON CONARD  MRN: [id number] DOB: [birth year]  Reason for Encounter: Medication Review  PCP : [name], MD  Allergies:  No Known Allergies  Medications: Outpatient Encounter Medications as of 09/12/2020  Medication Sig  . amLODipine (NORVASC) 10 MG tablet Take 1 tablet (10 mg total) by mouth daily.  Marland Kitchen atenolol (TENORMIN) 50 MG tablet Take 1 tablet (50 mg total) by mouth 2 (two) times daily.  Marland Kitchen atorvastatin (LIPITOR) 20 MG tablet Take 1 tablet (20 mg total) by mouth daily.  . Coenzyme Q10 (CO Q 10 PO) Take 1 tablet by mouth daily.  . hydrALAZINE (APRESOLINE) 25 MG tablet TAKE 1 TABLET BY MOUTH IN THE MORNING AND AT BEDTIME.  Marland Kitchen KRILL OIL PO Take 1 tablet by mouth daily.  Marland Kitchen lisinopril (ZESTRIL) 20 MG tablet Take 1 tablet (20 mg total) by mouth daily.  . Multiple Vitamins-Minerals (CENTRUM SILVER 50+MEN PO) Take 1 tablet by mouth.  . nitroGLYCERIN (NITROSTAT) 0.4 MG SL tablet PLACE 1 TABLET UNDER THE TONGUE EVERY 5 MINUTES AS NEEDED.  Marland Kitchen pantoprazole (PROTONIX) 40 MG tablet Take 1 tablet (40 mg total) by mouth daily.  . primidone (MYSOLINE) 50 MG tablet Take 1 tablet (50 mg total) by mouth 2 (two) times daily.   No facility-administered encounter medications on file as of 09/12/2020.    Current Diagnosis: Patient Active Problem List   Diagnosis Date Noted  . Drug-induced hyperkalemia 03/12/2020  . Educated about COVID-19 virus infection 10/19/2019  . Coarse tremors 06/10/2016  . Routine general medical examination at a health care facility 02/07/2016  . CAD (coronary artery disease) 05/01/2015  . CKD (chronic kidney disease), stage III ([place]) 05/01/2015  . 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 BARRETTS ESOPHAGUS 01/24/2010  . 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 Hyperlipidemia with target LDL less than 70 09/21/2007  . Chronic ischemic heart disease 09/21/2007  . ERECTILE  DYSFUNCTION 03/09/2007  . Essential hypertension 03/09/2007    Goals Addressed   None       BP Readings from Last 3 Encounters:  06/12/20 128/62  06/11/20 122/78  05/28/20 120/68    No results found for: HGBA1C   Patient obtains medications through Adherence Packaging  90 Days   Last adherence delivery included (date:08/15/20): (medication name and frequency) Atenolol 50 mg take 1 tab by mouth twice daily breakfast and bedtime Hydralazine 25 mg take 1 tab by mouth at breakfast Lisinopril 20 mg take 1 tab by mouth daily at bedtime Pantoprazole 40 mg take 1 tab by mouth at breakfast Amlodipine 10 mg take 1 tab by mouth at breakfast Atorvastatin 20 mg take 1 tab by mouth at bedtime Primidone 50 mg take 1 tab by mouth at breakfast and bedtime  Patient is due for next adherence delivery on: 11/13/20. Called patient and reviewed medication needs.  Patient declined need for refills at this time. Patient is aware of how to contact pharmacist if refills are needed prior to next adherence delivery.   Wendy Poet, [place] [phone]

## 2020-10-04 ENCOUNTER — Encounter: Payer: Self-pay | Admitting: Internal Medicine

## 2020-10-04 ENCOUNTER — Ambulatory Visit (INDEPENDENT_AMBULATORY_CARE_PROVIDER_SITE_OTHER): Payer: PPO | Admitting: Internal Medicine

## 2020-10-04 ENCOUNTER — Other Ambulatory Visit: Payer: Self-pay

## 2020-10-04 VITALS — BP 138/80 | HR 56 | Temp 98.1°F | Resp 18 | Ht 66.0 in | Wt 175.6 lb

## 2020-10-04 DIAGNOSIS — M7989 Other specified soft tissue disorders: Secondary | ICD-10-CM | POA: Diagnosis not present

## 2020-10-04 LAB — COMPREHENSIVE METABOLIC PANEL
ALT: 20 U/L (ref 0–53)
AST: 21 U/L (ref 0–37)
Albumin: 4.3 g/dL (ref 3.5–5.2)
Alkaline Phosphatase: 69 U/L (ref 39–117)
BUN: 16 mg/dL (ref 6–23)
CO2: 33 mEq/L — ABNORMAL HIGH (ref 19–32)
Calcium: 10.3 mg/dL (ref 8.4–10.5)
Chloride: 102 mEq/L (ref 96–112)
Creatinine, Ser: 1.25 mg/dL (ref 0.40–1.50)
GFR: 53.79 mL/min — ABNORMAL LOW (ref >60.00)
Glucose, Bld: 84 mg/dL (ref 70–99)
Potassium: 5 mEq/L (ref 3.5–5.1)
Sodium: 140 mEq/L (ref 135–145)
Total Bilirubin: 0.4 mg/dL (ref 0.2–1.2)
Total Protein: 7.5 g/dL (ref 6.0–8.3)

## 2020-10-04 LAB — BRAIN NATRIURETIC PEPTIDE: Pro B Natriuretic peptide (BNP): 146 pg/mL — ABNORMAL HIGH (ref 0.0–100.0)

## 2020-10-04 NOTE — Progress Notes (Unsigned)
   Subjective:   Patient ID: [name], male    DOB: May 14, 1941, 80 y.o.   MRN: [id number]  HPI The patient is an 80 YO man coming in for left foot swelling. Some pain with it and with bending more. The pain started in the last 2 days. Denies more activity or injury. He did have several days last week with about double steps of normal. No changes to breathing or weight. Denies chest pains with activity. Uses tylenol which helped some.   Review of Systems  Constitutional: Negative.   HENT: Negative.   Eyes: Negative.   Respiratory: Negative for cough, chest tightness and shortness of breath.   Cardiovascular: Negative for chest pain, palpitations and leg swelling.  Gastrointestinal: Negative for abdominal distention, abdominal pain, constipation, diarrhea, nausea and vomiting.  Musculoskeletal: Positive for arthralgias, joint swelling and myalgias.  Skin: Negative.   Neurological: Negative.   Psychiatric/Behavioral: Negative.     Objective:  Physical Exam Constitutional:      Appearance: He is well-developed and well-nourished.  HENT:     Head: Normocephalic and atraumatic.  Eyes:     Extraocular Movements: EOM normal.  Cardiovascular:     Rate and Rhythm: Normal rate and regular rhythm.  Pulmonary:     Effort: Pulmonary effort is normal. No respiratory distress.     Breath sounds: Normal breath sounds. No wheezing or rales.  Abdominal:     General: Bowel sounds are normal. There is no distension.     Palpations: Abdomen is soft.     Tenderness: There is no abdominal tenderness. There is no rebound.  Musculoskeletal:        General: Tenderness present. No edema.     Cervical back: Normal range of motion.     Comments: Tenderness left midfoot and minimal swelling non-pitting compared to right, no rash or skin color change, pulses palpable and left foot warm to touch  Skin:    General: Skin is warm and dry.  Neurological:     Mental Status: He is alert and oriented to  person, place, and time.     Coordination: Coordination normal.  Psychiatric:        Mood and Affect: Mood and affect normal.     Vitals:   10/04/20 1327  BP: 138/80  Pulse: (!) 56  Resp: 18  Temp: 98.1 F (36.7 C)  TempSrc: Oral  SpO2: 95%  Weight: 175 lb 9.6 oz (79.7 kg)  Height: 5\' 6"  (1.676 m)    This visit occurred during the SARS-CoV-2 public health emergency.  Safety protocols were in place, including screening questions prior to the visit, additional usage of staff PPE, and extensive cleaning of exam room while observing appropriate contact time as indicated for disinfecting solutions.   Assessment & Plan:

## 2020-10-04 NOTE — Patient Instructions (Signed)
We will check the labs today and let you know about the results.    

## 2020-10-05 ENCOUNTER — Other Ambulatory Visit: Payer: Self-pay | Admitting: Internal Medicine

## 2020-10-05 DIAGNOSIS — R0600 Dyspnea, unspecified: Secondary | ICD-10-CM

## 2020-10-05 DIAGNOSIS — M7989 Other specified soft tissue disorders: Secondary | ICD-10-CM | POA: Insufficient documentation

## 2020-10-05 NOTE — Assessment & Plan Note (Signed)
Could be caused by amlodipine. Checking CMP and BNP to rule out cardiac source.

## 2020-10-12 ENCOUNTER — Telehealth: Payer: Self-pay | Admitting: Pharmacist

## 2020-10-12 NOTE — Progress Notes (Signed)
Chronic Care Management Pharmacy Assistant   Name: [name]  MRN: [id number] DOB: 1940-12-02  Reason for Encounter: Medication Review  Patient Questions:  1.  Have you seen any other providers since your last visit? Yes, patient last saw Dr. Benjamine Mola on 10/04/20  2.  Any changes in your medicines or health? No    PCP : [name], MD  Allergies:  No Known Allergies  Medications: Outpatient Encounter Medications as of 10/12/2020  Medication Sig  . amLODipine (NORVASC) 10 MG tablet Take 1 tablet (10 mg total) by mouth daily.  Marland Kitchen atenolol (TENORMIN) 50 MG tablet Take 1 tablet (50 mg total) by mouth 2 (two) times daily.  Marland Kitchen atorvastatin (LIPITOR) 20 MG tablet Take 1 tablet (20 mg total) by mouth daily.  . Coenzyme Q10 (CO Q 10 PO) Take 1 tablet by mouth daily.  . hydrALAZINE (APRESOLINE) 25 MG tablet TAKE 1 TABLET BY MOUTH IN THE MORNING AND AT BEDTIME.  Marland Kitchen KRILL OIL PO Take 1 tablet by mouth daily.  Marland Kitchen lisinopril (ZESTRIL) 20 MG tablet Take 1 tablet (20 mg total) by mouth daily.  . Multiple Vitamins-Minerals (CENTRUM SILVER 50+MEN PO) Take 1 tablet by mouth.  . nitroGLYCERIN (NITROSTAT) 0.4 MG SL tablet PLACE 1 TABLET UNDER THE TONGUE EVERY 5 MINUTES AS NEEDED.  Marland Kitchen pantoprazole (PROTONIX) 40 MG tablet Take 1 tablet (40 mg total) by mouth daily.  . primidone (MYSOLINE) 50 MG tablet Take 1 tablet (50 mg total) by mouth 2 (two) times daily.   No facility-administered encounter medications on file as of 10/12/2020.    Current Diagnosis: Patient Active Problem List   Diagnosis Date Noted  . Foot swelling 10/05/2020  . Drug-induced hyperkalemia 03/12/2020  . Educated about COVID-19 virus infection 10/19/2019  . Coarse tremors 06/10/2016  . Routine general medical examination at a health care facility 02/07/2016  . CAD (coronary artery disease) 05/01/2015  . CKD (chronic kidney disease), stage III ([place]) 05/01/2015  . BPH with obstruction/lower urinary tract symptoms  11/13/2010  . BARRETTS ESOPHAGUS 01/24/2010  . 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 Hyperlipidemia with target LDL less than 70 09/21/2007  . Chronic ischemic heart disease 09/21/2007  . ERECTILE DYSFUNCTION 03/09/2007  . Essential hypertension 03/09/2007    Goals Addressed   None       BP Readings from Last 3 Encounters:  10/04/20 138/80  06/12/20 128/62  06/11/20 122/78    No results found for: HGBA1C   Patient obtains medications through Adherence Packaging  90 Days   Last adherence delivery included (date:08/15/20) (medication name and frequency)  Atenolol 50 mg take 1 tab by mouth twice daily breakfast and bedtime 08/15/20 Hydralazine 25 mg take 1 tab by mouth at breakfast 08/15/20 Lisinopril 20 mg take 1 tab by mouth daily at bedtime 08/15/20 Pantoprazole 40 mg take 1 tab by mouth at breakfast 08/15/20 Amlodipine 10 mg take 1 tab by mouth at breakfast 08/15/20 Atorvastatin 20 mg take 1 tab by mouth at bedtime 08/15/20 Primidone 50 mg take 1 tab by mouth at breakfast and bedtime 08/15/20  Patient declined  last delivery due to 90 day supply Atenolol 50 mg take 1 tab by mouth twice daily breakfast and bedtime 08/15/20 Hydralazine 25 mg take 1 tab by mouth at breakfast 08/15/20 Lisinopril 20 mg take 1 tab by mouth daily at bedtime 08/15/20 Pantoprazole 40 mg take 1 tab by mouth at breakfast 08/15/20 Amlodipine 10 mg take 1 tab by mouth at breakfast 08/15/20  Atorvastatin 20 mg take 1 tab by mouth at bedtime 08/15/20 Primidone 50 mg take 1 tab by mouth at breakfast and bedtime 08/15/20  Patient is due for next adherence delivery on: 11/14/20. Called patient and reviewed medication needs.  Patient declined need for refills at this time. Patient is aware of how to contact pharmacist if refills are needed prior to next adherence delivery.   Wendy Poet, Clinical Pharmacist Assistant [facility] [phone]   Total time:20

## 2020-11-01 ENCOUNTER — Telehealth: Payer: Self-pay | Admitting: Pharmacist

## 2020-11-01 NOTE — Progress Notes (Signed)
Chronic Care Management Pharmacy Assistant   Name: [name]  MRN: [id number] DOB: 1941-08-12   Reason for Encounter: Medication Review    Medications: Outpatient Encounter Medications as of 11/01/2020  Medication Sig   amLODipine (NORVASC) 10 MG tablet Take 1 tablet (10 mg total) by mouth daily.   atenolol (TENORMIN) 50 MG tablet Take 1 tablet (50 mg total) by mouth 2 (two) times daily.   atorvastatin (LIPITOR) 20 MG tablet Take 1 tablet (20 mg total) by mouth daily.   Coenzyme Q10 (CO Q 10 PO) Take 1 tablet by mouth daily.   hydrALAZINE (APRESOLINE) 25 MG tablet TAKE 1 TABLET BY MOUTH IN THE MORNING AND AT BEDTIME.   KRILL OIL PO Take 1 tablet by mouth daily.   lisinopril (ZESTRIL) 20 MG tablet Take 1 tablet (20 mg total) by mouth daily.   Multiple Vitamins-Minerals (CENTRUM SILVER 50+MEN PO) Take 1 tablet by mouth.   nitroGLYCERIN (NITROSTAT) 0.4 MG SL tablet PLACE 1 TABLET UNDER THE TONGUE EVERY 5 MINUTES AS NEEDED.   pantoprazole (PROTONIX) 40 MG tablet Take 1 tablet (40 mg total) by mouth daily.   primidone (MYSOLINE) 50 MG tablet Take 1 tablet (50 mg total) by mouth 2 (two) times daily.   No facility-administered encounter medications on file as of 11/01/2020.   Reviewed chart for medication changes ahead of medication coordination call.  No OVs, Consults, or hospital visits since last care coordination call/Pharmacist visit. (If appropriate, list visit date, provider name)  No medication changes indicated OR if recent visit, treatment plan here.  BP Readings from Last 3 Encounters:  10/04/20 138/80  06/12/20 128/62  06/11/20 122/78    No results found for: HGBA1C   Patient obtains medications through Adherence Packaging  90 Days   Last adherence delivery included:  Amlodipine 10 mg 1 tab bedtime Atenolol 50 mg 1 tab breakfast & bedtime    Atorvastatin 20 mg 1 tab bedtime Hydralazine 25 mg 1 tab breakfast & bedtime Lisinopril 20 mg 1 tab  bedtime    Nitroglycerin Sub 0.4 Dissolve 1 tab under tongue prn for pain      Pantoprazole 40 mg 1 tab morning Primidone 50 mg 1 tab breakfast & bedtime                                                                                                                                                                                     Patient is due for next adherence delivery on: 11/09/20 Called patient and reviewed medications and coordinated delivery.  This delivery to include: Amlodipine 10 mg 1 tab at brekafast Atenolol 50 mg 1 tab breakfast & bedtime  Atorvastatin 20 mg 1 tab bedtime Hydralazine 25 mg 1 tab breakfast & bedtime Lisinopril 20 mg 1 tab bedtime    Nitroglycerin Sub 0.4 Dissolve 1 tab under tongue prn for pain      Pantoprazole 40 mg 1 tab morning Primidone 50 mg 1 tab breakfast & bedtime                                                                                     Patient needs refills for: Pantoprazole 40 mg 1 tab morning-cpp requests Primidone 50 mg 1 tab breakfast & bedtime - Patient needs appt before refill Atenolol 50 mg 1 tab breakfast & bedtime -cpp requests  Confirmed delivery date of 11/09/20,, advised patient that pharmacy will contact them the morning of delivery.  Called patient back after calling Dr.Tat office to requests a refill and informed patient that he must be seen before new refills.  Georgiana Shore ,[place] Pharmacist Assistant [phone]    Time spent : 39 Minutes

## 2020-11-02 ENCOUNTER — Telehealth: Payer: Self-pay | Admitting: Internal Medicine

## 2020-11-02 ENCOUNTER — Other Ambulatory Visit: Payer: Self-pay | Admitting: Internal Medicine

## 2020-11-02 NOTE — Telephone Encounter (Signed)
Pt has been scheduled for 3/16 @ 9.20am

## 2020-11-02 NOTE — Telephone Encounter (Signed)
Patient called and was wondering if Dr. Ronnald Ramp would be willing to refill primidone (MYSOLINE) 50 MG tablet. The doctor who prescribed it told the patient to ask his PCP if he would continue the refills. Please advise. Please call the patient if rx is sent.   [facility] - [place], [place] - [address]

## 2020-11-02 NOTE — Telephone Encounter (Signed)
He needs to be seen

## 2020-11-03 ENCOUNTER — Other Ambulatory Visit: Payer: Self-pay | Admitting: Internal Medicine

## 2020-11-03 DIAGNOSIS — I2510 Atherosclerotic heart disease of native coronary artery without angina pectoris: Secondary | ICD-10-CM

## 2020-11-03 DIAGNOSIS — I10 Essential (primary) hypertension: Secondary | ICD-10-CM

## 2020-11-03 DIAGNOSIS — K2270 Barrett's esophagus without dysplasia: Secondary | ICD-10-CM

## 2020-11-03 DIAGNOSIS — K219 Gastro-esophageal reflux disease without esophagitis: Secondary | ICD-10-CM

## 2020-11-06 ENCOUNTER — Other Ambulatory Visit: Payer: Self-pay

## 2020-11-06 ENCOUNTER — Ambulatory Visit (HOSPITAL_COMMUNITY): Payer: PPO | Attending: Cardiovascular Disease

## 2020-11-06 DIAGNOSIS — R0600 Dyspnea, unspecified: Secondary | ICD-10-CM | POA: Diagnosis not present

## 2020-11-06 LAB — ECHOCARDIOGRAM COMPLETE
Area-P 1/2: 2.16 cm2
S' Lateral: 2.6 cm

## 2020-11-07 ENCOUNTER — Other Ambulatory Visit: Payer: Self-pay

## 2020-11-07 ENCOUNTER — Ambulatory Visit (INDEPENDENT_AMBULATORY_CARE_PROVIDER_SITE_OTHER): Payer: PPO | Admitting: Internal Medicine

## 2020-11-07 ENCOUNTER — Encounter: Payer: Self-pay | Admitting: Internal Medicine

## 2020-11-07 VITALS — BP 144/86 | HR 62 | Temp 98.6°F | Resp 16 | Ht 66.0 in | Wt 175.0 lb

## 2020-11-07 DIAGNOSIS — N1832 Chronic kidney disease, stage 3b: Secondary | ICD-10-CM | POA: Diagnosis not present

## 2020-11-07 DIAGNOSIS — G252 Other specified forms of tremor: Secondary | ICD-10-CM

## 2020-11-07 DIAGNOSIS — I10 Essential (primary) hypertension: Secondary | ICD-10-CM | POA: Diagnosis not present

## 2020-11-07 MED ORDER — PRIMIDONE 50 MG PO TABS: 50.0000 mg | ORAL_TABLET | Freq: Two times a day (BID) | ORAL | 1 refills | Status: DC

## 2020-11-07 NOTE — Progress Notes (Signed)
Subjective:  Patient ID: [name], male    DOB: [birth year]  Age: 80 y.o. MRN: [id number]  CC: Hypertension  This visit occurred during the SARS-CoV-2 public health emergency.  Safety protocols were in place, including screening questions prior to the visit, additional usage of staff PPE, and extensive cleaning of exam room while observing appropriate contact time as indicated for disinfecting solutions.    HPI ARRIE ZUERCHER presents for f/up -  His tremors are well controlled with mysoline. He would like to stay on the current dose. He is active and denies CP/DOE/palpitations/edema/fatigue. His BP has been well controlled.  Outpatient Medications Prior to Visit  Medication Sig Dispense Refill   atenolol (TENORMIN) 50 MG tablet Take 1 tablet (50 mg total) by mouth 2 (two) times daily. 180 tablet 1   atorvastatin (LIPITOR) 20 MG tablet Take 1 tablet (20 mg total) by mouth daily. 90 tablet 3   Coenzyme Q10 (CO Q 10 PO) Take 1 tablet by mouth daily.     hydrALAZINE (APRESOLINE) 25 MG tablet TAKE 1 TABLET BY MOUTH IN THE MORNING AND AT BEDTIME. 180 tablet 3   KRILL OIL PO Take 1 tablet by mouth daily.     lisinopril (ZESTRIL) 20 MG tablet Take 1 tablet (20 mg total) by mouth daily. 90 tablet 3   Multiple Vitamins-Minerals (CENTRUM SILVER 50+MEN PO) Take 1 tablet by mouth.     nitroGLYCERIN (NITROSTAT) 0.4 MG SL tablet PLACE 1 TABLET UNDER THE TONGUE EVERY 5 MINUTES AS NEEDED. 450 tablet 1   pantoprazole (PROTONIX) 40 MG tablet Take 1 tablet (40 mg total) by mouth daily. 90 tablet 1   primidone (MYSOLINE) 50 MG tablet Take 1 tablet (50 mg total) by mouth 2 (two) times daily. 180 tablet 3   amLODipine (NORVASC) 10 MG tablet Take 1 tablet (10 mg total) by mouth daily. 30 tablet 11   No facility-administered medications prior to visit.    ROS Review of Systems  Constitutional: Negative for diaphoresis and fatigue.  HENT: Negative.   Eyes: Negative for visual  disturbance.  Respiratory: Negative for cough, chest tightness, shortness of breath and wheezing.   Cardiovascular: Negative for chest pain, palpitations and leg swelling.  Gastrointestinal: Negative for abdominal pain, diarrhea, nausea and vomiting.  Endocrine: Negative.   Genitourinary: Negative.  Negative for difficulty urinating.  Musculoskeletal: Negative for arthralgias and myalgias.  Skin: Negative.  Negative for color change and pallor.  Neurological: Positive for tremors. Negative for dizziness, weakness, light-headedness and numbness.  Hematological: Negative for adenopathy. Does not bruise/bleed easily.  Psychiatric/Behavioral: Negative.     Objective:  BP (!) 144/86    Pulse 62    Temp 98.6 F (37 C) (Oral)    Resp 16    Ht 5\' 6"  (1.676 m)    Wt 175 lb (79.4 kg)    SpO2 96%    BMI 28.25 kg/m   BP Readings from Last 3 Encounters:  11/07/20 (!) 144/86  10/04/20 138/80  06/12/20 128/62    Wt Readings from Last 3 Encounters:  11/07/20 175 lb (79.4 kg)  10/04/20 175 lb 9.6 oz (79.7 kg)  06/12/20 176 lb (79.8 kg)    Physical Exam Vitals reviewed.  HENT:     Nose: Nose normal.     Mouth/Throat:     Mouth: Mucous membranes are moist.  Eyes:     General: No scleral icterus.    Conjunctiva/sclera: Conjunctivae normal.  Cardiovascular:     Rate and  Rhythm: Normal rate and regular rhythm.     Heart sounds: No murmur heard.   Pulmonary:     Effort: Pulmonary effort is normal.     Breath sounds: No stridor. No wheezing, rhonchi or rales.  Abdominal:     General: Abdomen is flat.     Palpations: There is no mass.     Tenderness: There is no abdominal tenderness. There is no guarding.  Musculoskeletal:        General: Normal range of motion.     Cervical back: Neck supple.     Right lower leg: No edema.     Left lower leg: No edema.  Lymphadenopathy:     Cervical: No cervical adenopathy.  Skin:    General: Skin is warm and dry.  Neurological:     General: No  focal deficit present.     Mental Status: He is alert.  Psychiatric:        Mood and Affect: Mood normal.        Behavior: Behavior normal.     Lab Results  Component Value Date   WBC 7.9 03/12/2020   HGB 14.2 03/12/2020   HCT 42.9 03/12/2020   PLT 217 03/12/2020   GLUCOSE 84 10/04/2020   CHOL 124 03/12/2020   TRIG 121 03/12/2020   HDL 44 03/12/2020   LDLCALC 60 03/12/2020   ALT 20 10/04/2020   AST 21 10/04/2020   NA 140 10/04/2020   K 5.0 10/04/2020   CL 102 10/04/2020   CREATININE 1.25 10/04/2020   BUN 16 10/04/2020   CO2 33 (H) 10/04/2020   TSH 0.73 03/12/2020   PSA 2.62 02/15/2018    EXERCISE TOLERANCE TEST (ETT)  Result Date: 10/13/2018  Blood pressure demonstrated a normal response to exercise.  There was no ST segment deviation noted during stress.  No chest pain noted with stress  HR response was submaximal, reducing the sensitivity of this test to detect ischemia.     Assessment & Plan:   Jakell was seen today for hypertension.  Diagnoses and all orders for this visit:  Essential hypertension- His BP is well controlled. -     CBC with Differential/Platelet; Future -     Basic metabolic panel; Future  Stage 3b chronic kidney disease ([place])- He will avoid nephrotoxic agents. -     CBC with Differential/Platelet; Future -     Basic metabolic panel; Future  Coarse tremors -     primidone (MYSOLINE) 50 MG tablet; Take 1 tablet (50 mg total) by mouth 2 (two) times daily.   I am having [name] "Joe" maintain his Multiple Vitamins-Minerals (CENTRUM SILVER 50+MEN PO), Coenzyme Q10 (CO Q 10 PO), KRILL OIL PO, nitroGLYCERIN, atenolol, pantoprazole, lisinopril, amLODipine, atorvastatin, hydrALAZINE, and primidone.  Meds ordered this encounter  Medications   primidone (MYSOLINE) 50 MG tablet    Sig: Take 1 tablet (50 mg total) by mouth 2 (two) times daily.    Dispense:  180 tablet    Refill:  1     Follow-up: Return in about 6 months (around  05/10/2021).  Scarlette Calico, MD

## 2020-11-07 NOTE — Patient Instructions (Signed)

## 2020-11-26 ENCOUNTER — Ambulatory Visit (INDEPENDENT_AMBULATORY_CARE_PROVIDER_SITE_OTHER): Payer: PPO | Admitting: Family

## 2020-11-26 ENCOUNTER — Encounter: Payer: Self-pay | Admitting: Family

## 2020-11-26 ENCOUNTER — Other Ambulatory Visit: Payer: Self-pay

## 2020-11-26 VITALS — BP 110/60 | HR 54 | Temp 98.6°F | Ht 66.0 in | Wt 175.0 lb

## 2020-11-26 DIAGNOSIS — K1120 Sialoadenitis, unspecified: Secondary | ICD-10-CM | POA: Diagnosis not present

## 2020-11-26 MED ORDER — AMOXICILLIN-POT CLAVULANATE 875-125 MG PO TABS: 1.0000 | ORAL_TABLET | Freq: Two times a day (BID) | ORAL | 0 refills | Status: AC

## 2020-11-26 NOTE — Patient Instructions (Signed)
Parotitis  Parotitis means that you have irritation and swelling (inflammation) in one or both of your parotid glands. These glands make saliva. They are found on each side of your face, below and in front of your earlobes. You may or may not have pain with this condition. What are the causes? This condition may be caused by:  Infections from germs (bacteria or viruses).  Something blocking the flow of saliva through the parotid glands. This can be a stone, scar tissue, or a tumor.  Diseases that cause your body's defense system (immune system) to attack healthy cells in your salivary glands. These are called autoimmune diseases. What increases the risk? You are more likely to get this condition if:  You are 82 years old or older.  You do not drink enough fluids (are dehydrated).  You drink too much alcohol.  You have: ? A dry mouth. ? Diabetes. ? Gout. ? A long-term illness.  You do not take good care of your mouth and teeth (poor dental hygiene).  You have had radiation treatments to the head and neck.  You take certain medicines. What are the signs or symptoms? Symptoms of this condition depend on the cause. They may include:  Swelling under and in front of the ear. This may get worse after you eat.  Redness of the skin over the parotid gland.  Pain and tenderness over the parotid gland. This may get worse after you eat.  Fever or chills.  Pus coming from the ducts inside the mouth.  Dry mouth.  A bad taste in the mouth. How is this treated? Treatment for this condition depends on the cause. Treatment may include:  Antibiotic medicine for an infection from bacteria.  Drinking more fluids.  Removing a stone or obstruction.  Treating a disease that is causing parotitis.  Surgery to drain an infection, remove a growth, or remove the whole gland. Treatment may not be needed if the swelling goes away with home care. Follow these instructions at  home: Medicines  Take over-the-counter and prescription medicines only as told by your doctor.  If you were prescribed an antibiotic medicine, take it as told by your doctor. Do not stop taking the antibiotic even if you start to feel better.   Managing pain and swelling  If told, put heat on the affected area. Do this as often as told by your doctor. Use the heat source that your doctor recommends, such as a moist heat pack or a heating pad. ? Place a towel between your skin and the heat source. ? Leave the heat on for 20-30 minutes. ? Remove the heat if your skin turns bright red. This is very important if you are unable to feel pain, heat, or cold. You may have a greater risk of getting burned.  Gargle with salt water 3-4 times a day or as needed. To make salt water, dissolve -1 tsp (3-6 g) of salt in 1 cup (237 mL) of warm water.  Gently rub your parotid glands as told by your doctor. General instructions  Drink enough fluid to keep your pee (urine) pale yellow.  Keep your mouth clean and moist.  Suck on sour candy. This may help to: ? Make your mouth less dry. ? Make more saliva.  Take good care of your mouth: ? Brush your teeth at least two times a day. ? Floss your teeth every day. ? See your dentist regularly.  Do not use any products that contain nicotine or  tobacco. These include cigarettes, e-cigarettes, and chewing tobacco. If you need help quitting, ask your doctor.  Do not drink alcohol.  Keep all follow-up visits as told by your doctor. This is important.   Contact a doctor if:  You have a fever or chills.  You have new symptoms.  Your symptoms get worse.  Your symptoms do not get better with treatment. Get help right away if:  You have trouble breathing or swallowing. Summary  Parotitis means that you have irritation and swelling (inflammation) in one or both of your parotid glands.  Symptoms include pain and swelling under and in front of the  ear.  Treatment for parotitis depends on the cause. In some cases, the condition may go away on its own with home care.  You should drink plenty of fluids, take good care of your mouth, and avoid tobacco products. This information is not intended to replace advice given to you by your health care provider. Make sure you discuss any questions you have with your health care provider. Document Revised: 03/09/2018 Document Reviewed: 03/09/2018 Elsevier Patient Education  [place].

## 2020-11-26 NOTE — Progress Notes (Signed)
[name] is a 80 y.o. male with the following history as recorded in [employer]:  Patient Active Problem List   Diagnosis Date Noted  . Foot swelling 10/05/2020  . Drug-induced hyperkalemia 03/12/2020  . Educated about COVID-19 virus infection 10/19/2019  . Coarse tremors 06/10/2016  . Routine general medical examination at a health care facility 02/07/2016  . CAD (coronary artery disease) 05/01/2015  . CKD (chronic kidney disease), stage III ([place]) 05/01/2015  . 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 BARRETTS ESOPHAGUS 01/24/2010  . 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 Hyperlipidemia with target LDL less than 70 09/21/2007  . Chronic ischemic heart disease 09/21/2007  . ERECTILE DYSFUNCTION 03/09/2007  . Essential hypertension 03/09/2007    Current Outpatient Medications  Medication Sig Dispense Refill  . amLODipine (NORVASC) 10 MG tablet Take 1 tablet (10 mg total) by mouth daily. 30 tablet 11  . amoxicillin-clavulanate (AUGMENTIN) 875-125 MG tablet Take 1 tablet by mouth 2 (two) times daily for 10 days. 20 tablet 0  . atenolol (TENORMIN) 50 MG tablet Take 1 tablet (50 mg total) by mouth 2 (two) times daily. 180 tablet 1  . atorvastatin (LIPITOR) 20 MG tablet Take 1 tablet (20 mg total) by mouth daily. 90 tablet 3  . Coenzyme Q10 (CO Q 10 PO) Take 1 tablet by mouth daily.    . hydrALAZINE (APRESOLINE) 25 MG tablet TAKE 1 TABLET BY MOUTH IN THE MORNING AND AT BEDTIME. 180 tablet 3  . KRILL OIL PO Take 1 tablet by mouth daily.    Marland Kitchen lisinopril (ZESTRIL) 20 MG tablet Take 1 tablet (20 mg total) by mouth daily. 90 tablet 3  . Multiple Vitamins-Minerals (CENTRUM SILVER 50+MEN PO) Take 1 tablet by mouth.    . nitroGLYCERIN (NITROSTAT) 0.4 MG SL tablet PLACE 1 TABLET UNDER THE TONGUE EVERY 5 MINUTES AS NEEDED. 450 tablet 1  . pantoprazole (PROTONIX) 40 MG tablet Take 1 tablet (40 mg total) by mouth daily. 90 tablet 1  . primidone (MYSOLINE) 50 MG  tablet Take 1 tablet (50 mg total) by mouth 2 (two) times daily. 180 tablet 1   No current facility-administered medications for this visit.    Allergies: Patient has no known allergies.  Past Medical History:  Diagnosis Date  . Anemia   . Barrett's esophagus   . Blood transfusion without reported diagnosis   . CAD (coronary artery disease)    2002 stenting of the RCA,, 2006 Stenting of diag with DES, PTCA of Circ, 50% LAD  . Cancer ([place])    skin cancer scalp and forehead  . 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 Diverticulosis   . DJD (degenerative joint disease)   . Duodenal ulcer   . ED (erectile dysfunction)   . GERD (gastroesophageal reflux disease)    increased s/s in the last week 03-22-19 PV   . Hemorrhoids   . Hyperlipidemia   . Hypertension   . Myocardial infarction ([place])    2002  . 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   mid frontal scalp-[id number]  . SCC (squamous cell carcinoma) 06/15/2019   front middle scalp-tx cx46fu  . SCC (squamous cell carcinoma) 12/27/2002   CIS-right angle of jaw--[id number]  . SCC (squamous cell carcinoma) 09/24/2004   CIS--mid post crown, left mis crown medial,left first web space-tx cx34fu  . SCC (squamous cell carcinoma) 06/03/2006   SCCA-right temple-tx cx31fu  .  SCC (squamous cell carcinoma) 06/03/2006   CIS-right forehead, left mis crown scalp medial-tx cx38fu  . 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/30/2006   right temple lateral-txpbx  . SCC (squamous cell carcinoma) 11/10/2006   left forehead, left hand proximal  . 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/02/2009   left scalp-cx16fu  . SCC (squamous cell carcinoma) 01/24/2015   left scalp-cx3fu  . SCC (squamous cell carcinoma) 11/12/2015   front lateralscalp-cx66fu, medial scalp-cx45fu  . SCC (squamous cell carcinoma) 12/17/2016   front center scalp-[id number]  . Squamous cell carcinoma of skin 08/10/2017   CIS--right ear rim, top of scalp,  left side scalp-tx-cx28fu    Past Surgical History:  Procedure Laterality Date  . COLONOSCOPY    . ELBOW ARTHROSCOPY    . heart stents     2002,2004  . SPINAL FUSION    . SPINAL FUSION    . SPINE SURGERY     cervical  . UPPER GASTROINTESTINAL ENDOSCOPY      Family History  Problem Relation Age of Onset  . Alcohol abuse Other   . Coronary artery disease Other   . Alzheimer's disease Other   . Dementia Mother   . Colon cancer Neg Hx   . Esophageal cancer Neg Hx   . Rectal cancer Neg Hx   . Stomach cancer Neg Hx   . Colon polyps Neg Hx     Social History   Tobacco Use  . Smoking status: Former Smoker    Quit date: 08/25/1961    Years since quitting: 59.2  . Smokeless tobacco: Never Used  Substance Use Topics  . Alcohol use: Yes    Comment: rarely    Subjective:  Pain/ swelling along right jawline/ beneath right ear x 5 days; actually saw dentist last week and exam was normal; no other respiratory symptoms; no fever;     Objective:  Vitals:   11/26/20 1032  BP: 110/60  Pulse: (!) 54  Temp: 98.6 F (37 C)  TempSrc: Oral  SpO2: 95%  Weight: 175 lb (79.4 kg)  Height: 5\' 6"  (1.676 m)    General: Well developed, well nourished, in no acute distress  Skin : Warm and dry.  Head: Normocephalic and atraumatic  Eyes: Sclera and conjunctiva clear; pupils round and reactive to light; extraocular movements intact  Ears: External normal; canals clear; tympanic membranes normal; mild tenderness/ swelling noted over right parotid gland Oropharynx: Pink, supple. No suspicious lesions  Neck: Supple without thyromegaly, adenopathy  Lungs: Respirations unlabored;  Neurologic: Alert and oriented; speech intact; face symmetrical; moves all extremities well; CNII-XII intact without focal deficit    Assessment:  1. Parotitis     Plan:  Rx for Augmentin 875 mg bid x 10 days; follow-up worse, no better.  This visit occurred during the SARS-CoV-2 public health emergency.  Safety  protocols were in place, including screening questions prior to the visit, additional usage of staff PPE, and extensive cleaning of exam room while observing appropriate contact time as indicated for disinfecting solutions.     No follow-ups on file.  No orders of the defined types were placed in this encounter.   Requested Prescriptions   Signed Prescriptions Disp Refills  . amoxicillin-clavulanate (AUGMENTIN) 875-125 MG tablet 20 tablet 0    Sig: Take 1 tablet by mouth 2 (two) times daily for 10 days.

## 2020-12-03 ENCOUNTER — Telehealth: Payer: Self-pay

## 2020-12-03 NOTE — Progress Notes (Signed)
    Chronic Care Management Pharmacy Assistant   Name: [name]  MRN: [id number] DOB: 04/25/1941  Reason for Encounter: Medication Review   Medications: Outpatient Encounter Medications as of 12/03/2020  Medication Sig  . amLODipine (NORVASC) 10 MG tablet Take 1 tablet (10 mg total) by mouth daily.  Marland Kitchen amoxicillin-clavulanate (AUGMENTIN) 875-125 MG tablet Take 1 tablet by mouth 2 (two) times daily for 10 days.  Marland Kitchen atenolol (TENORMIN) 50 MG tablet Take 1 tablet (50 mg total) by mouth 2 (two) times daily.  Marland Kitchen atorvastatin (LIPITOR) 20 MG tablet Take 1 tablet (20 mg total) by mouth daily.  . Coenzyme Q10 (CO Q 10 PO) Take 1 tablet by mouth daily.  . hydrALAZINE (APRESOLINE) 25 MG tablet TAKE 1 TABLET BY MOUTH IN THE MORNING AND AT BEDTIME.  Marland Kitchen KRILL OIL PO Take 1 tablet by mouth daily.  Marland Kitchen lisinopril (ZESTRIL) 20 MG tablet Take 1 tablet (20 mg total) by mouth daily.  . Multiple Vitamins-Minerals (CENTRUM SILVER 50+MEN PO) Take 1 tablet by mouth.  . nitroGLYCERIN (NITROSTAT) 0.4 MG SL tablet PLACE 1 TABLET UNDER THE TONGUE EVERY 5 MINUTES AS NEEDED.  Marland Kitchen pantoprazole (PROTONIX) 40 MG tablet Take 1 tablet (40 mg total) by mouth daily.  . primidone (MYSOLINE) 50 MG tablet Take 1 tablet (50 mg total) by mouth 2 (two) times daily.   No facility-administered encounter medications on file as of 12/03/2020.    Reviewed chart for medication changes ahead of medication coordination call.  No OVs, Consults, or hospital visits since last care coordination call/Pharmacist visit. (If appropriate, list visit date, provider name)  No medication changes indicated OR if recent visit, treatment plan here.  BP Readings from Last 3 Encounters:  11/26/20 110/60  11/07/20 (!) 144/86  10/04/20 138/80    No results found for: HGBA1C   Patient obtains medications through Adherence Packaging  90 Days   Last adherence delivery included: (medication name and frequency) Amlodipine 10 mg 1 tab  bedtime Atenolol 50 mg 1 tab breakfast & bedtime    Atorvastatin 20 mg 1 tab bedtime Hydralazine 25 mg 1 tab breakfast & bedtime Lisinopril 20 mg 1 tab bedtime    Nitroglycerin Sub 0.4 Dissolve 1 tab under tongue prn for pain      Pantoprazole 40 mg 1 tab morning Primidone 50 mg 1 tab breakfast & bedtime  Called patient and reviewed medications. Patient is not due for delivery until June 2022.  Star Rating Drugs: Atorvastatin - Last fill 11/21/20 90D Lisinopril - Last fill 11/21/20 [place], RMA Clinical Pharmacists Assistant [phone]  Time Spent: [phone]

## 2020-12-05 ENCOUNTER — Other Ambulatory Visit: Payer: Self-pay

## 2020-12-05 ENCOUNTER — Ambulatory Visit (INDEPENDENT_AMBULATORY_CARE_PROVIDER_SITE_OTHER): Payer: PPO | Admitting: Internal Medicine

## 2020-12-05 ENCOUNTER — Ambulatory Visit: Payer: PPO | Admitting: Family Medicine

## 2020-12-05 ENCOUNTER — Encounter: Payer: Self-pay | Admitting: Internal Medicine

## 2020-12-05 ENCOUNTER — Encounter: Payer: Self-pay | Admitting: Family Medicine

## 2020-12-05 VITALS — BP 124/82 | HR 55 | Temp 98.3°F | Ht 66.0 in | Wt 175.0 lb

## 2020-12-05 VITALS — BP 110/70 | HR 53 | Ht 66.0 in | Wt 177.0 lb

## 2020-12-05 DIAGNOSIS — M25552 Pain in left hip: Secondary | ICD-10-CM | POA: Diagnosis not present

## 2020-12-05 DIAGNOSIS — M25551 Pain in right hip: Secondary | ICD-10-CM | POA: Diagnosis not present

## 2020-12-05 DIAGNOSIS — M7062 Trochanteric bursitis, left hip: Secondary | ICD-10-CM

## 2020-12-05 DIAGNOSIS — M7061 Trochanteric bursitis, right hip: Secondary | ICD-10-CM | POA: Diagnosis not present

## 2020-12-05 DIAGNOSIS — I10 Essential (primary) hypertension: Secondary | ICD-10-CM

## 2020-12-05 MED ORDER — PREDNISONE 10 MG PO TABS: ORAL_TABLET | ORAL | 0 refills | Status: DC

## 2020-12-05 NOTE — Progress Notes (Signed)
   [name], am serving as a Education administrator for Dr. Lynne [name].  Subjective:    CC: B hip pain  HPI: Pt is  80 y/o male presenting w/ c/o B hip pain going on a couple weeks. Patient states that he is a side sleeper and he will wake up during the night to roll over to relieve the pain from laying on the side. Patient states that now the pain is happening during the day and not just at night and L>R  He locates his pain to Lateral hip, no back pain, and pain does not radiate. Taking tylenol to help with pain, was given prednisone from Dr. Jenny Reichmann this morning but has not picked it up. Patient was told at his office visit with Dr. Jenny Reichmann.  Prednisone was prescribed but the pharmacy it was sent to cost $80 and he would like it sent to a different pharmacy.  Radiating pain: no Aggravating factors:Sleeping on his side Treatments tried: tylenol   Pertinent review of Systems: No fevers or chills  Relevant historical information: Hypertension, CKD   Objective:    Vitals:   12/05/20 1348  BP: 110/70  Pulse: (!) 53  SpO2: 97%   General: Well Developed, well nourished, and in no acute distress.   MSK: Right hip normal-appearing  normal motion. Tender palpation greater trochanter. Hip abduction strength diminished 4/5 with pain.  External rotation strength mildly diminished with pain.  Left hip normal-appearing Normal motion. Tender palpation greater trochanter. Hip abduction strength mildly diminished 4/5 with pain. External rotation strength mildly diminished with pain.    Impression and Recommendations:    Assessment and Plan: 80 y.o. male with bilateral hip pain at the lateral aspect of hip and greater trochanter due to hip abductor tendinopathy/greater trochanteric bursitis.  Primary treatment is physical therapy.  Referral placed to [facility].  However temporary will use short course of oral prednisone.  This should be somewhat helpful.  If needed could consider steroid  injection as well would like to avoid those if possible.   Recheck in 6 weeks. PDMP not reviewed this encounter. Orders Placed This Encounter  Procedures  . Ambulatory referral to Physical Therapy    Referral Priority:   Routine    Referral Type:   Physical Medicine    Referral Reason:   Specialty Services Required    Requested Specialty:   Physical Therapy   Meds ordered this encounter  Medications  . predniSONE (DELTASONE) 10 MG tablet    Sig: 2 tabs by mouth per day for 5 days    Dispense:  10 tablet    Refill:  0    Discussed warning signs or symptoms. Please see discharge instructions. Patient expresses understanding.   The above documentation has been reviewed and is accurate and complete [name], M.D.

## 2020-12-05 NOTE — Patient Instructions (Signed)
Thank you for coming in today.  I've referred you to Physical Therapy.  Let us know if you don't hear from them in one week.  Please use voltaren gel up to 4x daily for pain as needed.   Recheck in about 6 weeks.   Let me know if you have a problem.   OK to take that prednisone.    Hip Bursitis  Hip bursitis is swelling of one or more fluid-filled sacs (bursae) in your hip joint. This condition can cause pain, and your symptoms may come and go over time. What are the causes?  Repeated use of your hip muscles.  Injury to the hip.  Weak butt muscles.  Bone spurs.  Infection. In some cases, the cause may not be known. What increases the risk? You are more likely to develop this condition if:  You had a past hip injury or hip surgery.  You have a condition, such as arthritis, gout, diabetes, or thyroid disease.  You have spine problems.  You have one leg that is shorter than the other.  You run a lot or do long-distance running.  You play sports where there is a risk of injury or falling, such as football, martial arts, or skiing. What are the signs or symptoms? Symptoms may come and go, and they often include:  Pain in the hip or groin area. Pain may get worse when you move your hip.  Tenderness and swelling of the hip. In rare cases, the bursa may become infected. If this happens, you may get a fever, as well as have warmth and redness in the hip area. How is this treated? This condition is treated by:  Resting your hip.  Icing your hip.  Wrapping the hip area with an elastic bandage (compression wrap).  Keeping the hip raised. Other treatments may include medicine, draining fluid out of the bursa, or using crutches, a cane, or a walker. Surgery may be needed, but this is rare. Long-term treatment may include doing exercises to help your strength and flexibility. It may also include lifestyle changes like losing weight to lessen the strain on your  hip. Follow these instructions at home: Managing pain, stiffness, and swelling  If told, put ice on the painful area. ? Put ice in a plastic bag. ? Place a towel between your skin and the bag. ? Leave the ice on for 20 minutes, 2-3 times a day.  Raise your hip by putting a pillow under your hips while you lie down. Stop if you feel pain.  If told, put heat on the affected area. Do this as often as told by your doctor. Use a moist heat pack or a heating pad as told by your doctor. ? Place a towel between your skin and the heat source. ? Leave the heat on for 20-30 minutes. ? Take off the heat if your skin turns bright red. This is very important if you are unable to feel pain, heat, or cold. You may have a greater risk of getting burned.      Activity  Do not use your hip to support your body weight until your doctor says that you can.  Use crutches, a cane, or a walker as told by your doctor.  If the affected leg is one that you use to drive, ask your doctor if it is safe to drive.  Rest and protect your hip as much as you can until you feel better.  Return to your normal activities  as told by your doctor. Ask your doctor what activities are safe for you.  Do exercises as told by your doctor. General instructions  Take over-the-counter and prescription medicines only as told by your doctor.  Gently rub and stretch your injured area as often as is comfortable.  Wear elastic bandages only as told by your doctor.  If one of your legs is shorter than the other, get fitted for a shoe insert or orthotic.  Keep a healthy weight. Follow instructions from your doctor.  Keep all follow-up visits as told by your doctor. This is important. How is this prevented?  Exercise regularly, as told by your doctor.  Wear the right shoes for the sport you play.  Warm up and stretch before being active. Cool down and stretch after being active.  Take breaks often from repeated  activity.  Avoid activities that bother your hip or cause pain.  Avoid sitting down for a long time. Where to find more information  [employer]: orthoinfo.aaos.org Contact a doctor if:  You have a fever.  You have new symptoms.  You have trouble walking or doing everyday activities.  You have pain that gets worse or does not get better with medicine.  Your skin around your hip is red.  You get a feeling of warmth in your hip area. Get help right away if:  You cannot move your hip.  You have very bad pain.  You cannot control the muscles in your feet. Summary  Hip bursitis is swelling of one or more fluid-filled sacs (bursae) in your hip joint.  Symptoms often come and go over time.  This condition is often treated by resting and icing the hip. It also may help to keep the area raised and wrapped in an elastic bandage. Other treatments may be needed. This information is not intended to replace advice given to you by your health care provider. Make sure you discuss any questions you have with your health care provider. Document Revised: 06/13/2019 Document Reviewed: 04/19/2018 Elsevier Patient Education  2021 [employer].

## 2020-12-05 NOTE — Patient Instructions (Signed)
Please take all new medication as prescribed - the prednisone  You will be contacted regarding the referral for: Sports Medicine  Please continue all other medications as before, and refills have been done if requested.  Please have the pharmacy call with any other refills you may need.  Please continue your efforts at being more active, low cholesterol diet, and weight control.  Please keep your appointments with your specialists as you may have planned

## 2020-12-05 NOTE — Progress Notes (Signed)
Patient ID: [name], male   DOB: [birth year], 80 y.o.   MRN: [id number]        Chief Complaint: bilat hip pain       HPI:  [name] is a 80 y.o. male here with c/o pain and sore tenderness to both lateral hip areas left > right mild to mod for several wks, worse to lie on the left then the right sides at night, better to not do that but finds he cannot sleep well on his back as he never has.  Pain overall has become worse to walk as well, severe over the past weekend  Denies associated LBP or LE radicular pain.  No trauma, falls, gait issue or recent worsening GI or GU symptoms.  Pt denies chest pain, increased sob or doe, wheezing, orthopnea, PND, increased LE swelling, palpitations, dizziness or syncope.  Denies new worsening focal neuro s/s.   Pt denies polydipsia, polyuria,    Pt denies fever, wt loss, night sweats, loss of appetite, or other constitutional symptoms        Wt Readings from Last 3 Encounters:  12/05/20 177 lb (80.3 kg)  12/05/20 175 lb (79.4 kg)  11/26/20 175 lb (79.4 kg)   BP Readings from Last 3 Encounters:  12/05/20 110/70  12/05/20 124/82  11/26/20 110/60         Past Medical History:  Diagnosis Date  . Anemia   . Barrett's esophagus   . Blood transfusion without reported diagnosis   . CAD (coronary artery disease)    2002 stenting of the RCA,, 2006 Stenting of diag with DES, PTCA of Circ, 50% LAD  . Cancer ([place])    skin cancer scalp and forehead  . 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 Diverticulosis   . DJD (degenerative joint disease)   . Duodenal ulcer   . ED (erectile dysfunction)   . GERD (gastroesophageal reflux disease)    increased s/s in the last week 03-22-19 PV   . Hemorrhoids   . Hyperlipidemia   . Hypertension   . Myocardial infarction ([place])    2002  . 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 SCC (squamous cell carcinoma) 06/15/2019   front  middle scalp-tx cx61fu  . SCC (squamous cell carcinoma) 12/27/2002   CIS-right angle of jaw--cx71fu  . SCC (squamous cell carcinoma) 09/24/2004   CIS--mid post crown, left mis crown medial,left first web space-tx cx65fu  . SCC (squamous cell carcinoma) 06/03/2006   SCCA-right temple-tx cx67fu  . SCC (squamous cell carcinoma) 06/03/2006   CIS-right forehead, left mis crown scalp medial-tx cx59fu  . 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/30/2006   right temple lateral-txpbx  . SCC (squamous cell carcinoma) 11/10/2006   left forehead, left hand proximal  . 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/02/2009   left scalp-cx62fu  . SCC (squamous cell carcinoma) 01/24/2015   left scalp-cx30fu  . SCC (squamous cell carcinoma) 11/12/2015   front lateralscalp-cx81fu, medial scalp-cx34fu  . SCC (squamous cell carcinoma) 12/17/2016   front center scalp-cx67fu  . Squamous cell carcinoma of skin 08/10/2017   CIS--right ear rim, top of scalp, left side scalp-tx-cx64fu   Past Surgical History:  Procedure Laterality Date  . COLONOSCOPY    . ELBOW ARTHROSCOPY    . heart stents     2002,2004  . SPINAL FUSION    . SPINAL FUSION    .  SPINE SURGERY     cervical  . UPPER GASTROINTESTINAL ENDOSCOPY      reports that he quit smoking about 59 years ago. He has never used smokeless tobacco. He reports current alcohol use. He reports that he does not use drugs. family history includes Alcohol abuse in an other family member; Alzheimer's disease in an other family member; Coronary artery disease in an other family member; Dementia in his mother. No Known Allergies Current Outpatient Medications on File Prior to Visit  Medication Sig Dispense Refill  . atenolol (TENORMIN) 50 MG tablet Take 1 tablet (50 mg total) by mouth 2 (two) times daily. 180 tablet 1  . atorvastatin (LIPITOR) 20 MG tablet Take 1 tablet (20 mg total) by mouth daily. 90 tablet 3  . Coenzyme Q10 (CO Q 10 PO) Take 1 tablet by mouth daily.    .  hydrALAZINE (APRESOLINE) 25 MG tablet TAKE 1 TABLET BY MOUTH IN THE MORNING AND AT BEDTIME. 180 tablet 3  . KRILL OIL PO Take 1 tablet by mouth daily.    Marland Kitchen lisinopril (ZESTRIL) 20 MG tablet Take 1 tablet (20 mg total) by mouth daily. 90 tablet 3  . Multiple Vitamins-Minerals (CENTRUM SILVER 50+MEN PO) Take 1 tablet by mouth.    . nitroGLYCERIN (NITROSTAT) 0.4 MG SL tablet PLACE 1 TABLET UNDER THE TONGUE EVERY 5 MINUTES AS NEEDED. 450 tablet 1  . pantoprazole (PROTONIX) 40 MG tablet Take 1 tablet (40 mg total) by mouth daily. 90 tablet 1  . primidone (MYSOLINE) 50 MG tablet Take 1 tablet (50 mg total) by mouth 2 (two) times daily. 180 tablet 1  . amLODipine (NORVASC) 10 MG tablet Take 1 tablet (10 mg total) by mouth daily. 30 tablet 11   No current facility-administered medications on file prior to visit.        ROS:  All others reviewed and negative.  Objective        PE:  BP 124/82 (BP Location: Left Arm, Patient Position: Sitting, Cuff Size: Large)   Pulse (!) 55   Temp 98.3 F (36.8 C) (Oral)   Ht 5\' 6"  (1.676 m)   Wt 175 lb (79.4 kg)   SpO2 95%   BMI 28.25 kg/m                 Constitutional: Pt appears in NAD               HENT: Head: NCAT.                Right Ear: External ear normal.                 Left Ear: External ear normal.                Eyes: . Pupils are equal, round, and reactive to light. Conjunctivae and EOM are normal               Nose: without d/c or deformity               Neck: Neck supple. Gross normal ROM               Cardiovascular: Normal rate and regular rhythm.                 Pulmonary/Chest: Effort normal and breath sounds without rales or wheezing.                Abd:  Soft, NT, ND, + BS, no organomegaly               +  left > right tender areas over the greater trochanters bilaterally                        Neurological: Pt is alert. At baseline orientation, motor grossly intact               Skin: Skin is warm. No rashes, no other new lesions,  LE edema - none               Psychiatric: Pt behavior is normal without agitation   Micro: none  Cardiac tracings I have personally interpreted today:  none  Pertinent Radiological findings (summarize): none   Lab Results  Component Value Date   WBC 7.9 03/12/2020   HGB 14.2 03/12/2020   HCT 42.9 03/12/2020   PLT 217 03/12/2020   GLUCOSE 84 10/04/2020   CHOL 124 03/12/2020   TRIG 121 03/12/2020   HDL 44 03/12/2020   LDLCALC 60 03/12/2020   ALT 20 10/04/2020   AST 21 10/04/2020   NA 140 10/04/2020   K 5.0 10/04/2020   CL 102 10/04/2020   CREATININE 1.25 10/04/2020   BUN 16 10/04/2020   CO2 33 (H) 10/04/2020   TSH 0.73 03/12/2020   PSA 2.62 02/15/2018   Assessment/Plan:  [name] is a 80 y.o. White or Caucasian [1] male with  has a past medical history of Anemia, Barrett's esophagus, Blood transfusion without reported diagnosis, CAD (coronary artery disease), Cancer ([place]), Chronic kidney disease, Diverticulosis, DJD (degenerative joint disease), Duodenal ulcer, ED (erectile dysfunction), GERD (gastroesophageal reflux disease), Hemorrhoids, Hyperlipidemia, Hypertension, Myocardial infarction ([place]), SCC (squamous cell carcinoma) (07/07/2018), SCC (squamous cell carcinoma) (06/15/2019), SCC (squamous cell carcinoma) (12/27/2002), SCC (squamous cell carcinoma) (09/24/2004), SCC (squamous cell carcinoma) (06/03/2006), SCC (squamous cell carcinoma) (06/03/2006), SCC (squamous cell carcinoma) (07/30/2006), SCC (squamous cell carcinoma) (11/10/2006), SCC (squamous cell carcinoma) (07/02/2009), SCC (squamous cell carcinoma) (01/24/2015), SCC (squamous cell carcinoma) (11/12/2015), SCC (squamous cell carcinoma) (12/17/2016), and Squamous cell carcinoma of skin (08/10/2017).  Bilateral hip pain C/w bilateral left > right bursitis, for predpac asd, refer sport medicine  Essential hypertension BP Readings from Last 3 Encounters:  12/05/20 110/70  12/05/20 124/82  11/26/20 110/60    Stable, pt to continue medical treatment tenormin, lisinopril, hydralazine   Followup: Return if symptoms worsen or fail to improve.  Cathlean Cower, MD 12/08/2020 11:10 PM [facility]

## 2020-12-08 ENCOUNTER — Encounter: Payer: Self-pay | Admitting: Internal Medicine

## 2020-12-08 NOTE — Assessment & Plan Note (Signed)
BP Readings from Last 3 Encounters:  12/05/20 110/70  12/05/20 124/82  11/26/20 110/60   Stable, pt to continue medical treatment tenormin, lisinopril, hydralazine

## 2020-12-08 NOTE — Assessment & Plan Note (Signed)
C/w bilateral left > right bursitis, for predpac asd, refer sport medicine

## 2020-12-10 ENCOUNTER — Ambulatory Visit: Payer: PPO | Admitting: Dermatology

## 2020-12-10 ENCOUNTER — Other Ambulatory Visit: Payer: Self-pay

## 2020-12-10 DIAGNOSIS — L570 Actinic keratosis: Secondary | ICD-10-CM | POA: Diagnosis not present

## 2020-12-10 DIAGNOSIS — D485 Neoplasm of uncertain behavior of skin: Secondary | ICD-10-CM

## 2020-12-10 DIAGNOSIS — Z872 Personal history of diseases of the skin and subcutaneous tissue: Secondary | ICD-10-CM

## 2020-12-10 DIAGNOSIS — D044 Carcinoma in situ of skin of scalp and neck: Secondary | ICD-10-CM

## 2020-12-10 DIAGNOSIS — Z85828 Personal history of other malignant neoplasm of skin: Secondary | ICD-10-CM

## 2020-12-18 ENCOUNTER — Encounter: Payer: Self-pay | Admitting: Dermatology

## 2020-12-18 NOTE — Progress Notes (Signed)
   Follow-Up Visit   Subjective  [name] is a 80 y.o. male who presents for the following: Other (Spots of scalp. History of skin cancer of mid frontal scalp and history of AKs).  Enlarging crust upper forehead plus several other smaller crusty spots Location:  Duration:  Quality:  Associated Signs/Symptoms: Modifying Factors:  Severity:  Timing: Context:   Objective  Well appearing patient in no apparent distress; mood and affect are within normal limits. Objective  Scalp, face (11): Roughly a dozen gritty 2 to 5 mm pink crusts face and scalp  Objective  frontal scalp: 1 cm hyperkeratotic papule, rule out hypertrophic CIS.       All skin waist up examined.   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, face  Destruction of lesion - Scalp, face Complexity: simple   Destruction method: cryotherapy   Informed consent: discussed and consent obtained   Timeout:  patient name, date of birth, surgical site, and procedure verified Lesion destroyed using liquid nitrogen: Yes   Region frozen until ice ball extended beyond lesion: Yes   Cryotherapy cycles:  5 Outcome: patient tolerated procedure well with no complications   Post-procedure details: wound care instructions given    Destruction of lesion - Scalp, face  Neoplasm of uncertain behavior of skin frontal scalp  Skin / nail biopsy Type of biopsy: tangential   Informed consent: discussed and consent obtained   Timeout: patient name, date of birth, surgical site, and procedure verified   Procedure prep:  Patient was prepped and draped in usual sterile fashion Prep type:  Isopropyl alcohol Anesthesia: the lesion was anesthetized in a standard fashion   Anesthetic:  1% lidocaine w/ epinephrine 1-100,000 buffered w/ 8.4% NaHCO3 Instrument used: flexible razor blade   Hemostasis achieved with: pressure, aluminum chloride and electrodesiccation   Outcome: patient tolerated procedure well    Post-procedure details: sterile dressing applied and wound care instructions given   Dressing type: bandage and petrolatum    Destruction of lesion Complexity: extensive   Destruction method: electrodesiccation and curettage   Informed consent: discussed and consent obtained   Timeout:  patient name, date of birth, surgical site, and procedure verified Procedure prep:  Patient was prepped and draped in usual sterile fashion Prep type:  Isopropyl alcohol Anesthesia: the lesion was anesthetized in a standard fashion   Anesthetic:  1% lidocaine w/ epinephrine 1-100,000 buffered w/ 8.4% NaHCO3 Curettage performed in three different directions: Yes   Electrodesiccation performed over the curetted area: Yes   Curettage cycles:  3 Lesion length (cm):  1.2 Lesion width (cm):  1.2 Margin per side (cm):  0 Final wound size (cm):  1.2 Hemostasis achieved with:  pressure and aluminum chloride Outcome: patient tolerated procedure well with no complications   Post-procedure details: sterile dressing applied and wound care instructions given   Dressing type: bandage and petrolatum    Specimen 1 - Surgical pathology Differential Diagnosis: SCC vs other tx with bx Check margins: No Hyperkeratotic papule  After shave biopsy the base of the lesion was treated with curettage plus cautery.      I, Lavonna Monarch, MD, have reviewed all documentation for this visit.  The documentation on 12/18/20 for the exam, diagnosis, procedures, and orders are all accurate and complete.

## 2020-12-28 ENCOUNTER — Ambulatory Visit: Payer: PPO | Attending: Family Medicine | Admitting: Physical Therapy

## 2020-12-28 ENCOUNTER — Encounter: Payer: Self-pay | Admitting: Physical Therapy

## 2020-12-28 ENCOUNTER — Other Ambulatory Visit: Payer: Self-pay

## 2020-12-28 DIAGNOSIS — R252 Cramp and spasm: Secondary | ICD-10-CM

## 2020-12-28 DIAGNOSIS — M25552 Pain in left hip: Secondary | ICD-10-CM

## 2020-12-28 DIAGNOSIS — M25551 Pain in right hip: Secondary | ICD-10-CM

## 2020-12-28 DIAGNOSIS — M25562 Pain in left knee: Secondary | ICD-10-CM | POA: Diagnosis not present

## 2020-12-28 DIAGNOSIS — G8929 Other chronic pain: Secondary | ICD-10-CM | POA: Diagnosis not present

## 2020-12-28 NOTE — Patient Instructions (Signed)
Access Code: [email or web address] URL: [email or web address] Date: 12/28/2020 Prepared by: Amador Cunas  Exercises Sit to Stand Without Arm Support - 1 x daily - 7 x weekly - 3 sets - 10 reps Seated Hamstring Stretch - 1 x daily - 7 x weekly - 3 sets - 2 reps - 15-20 sec hold Standing Hip Abduction with Counter Support - 1 x daily - 7 x weekly - 3 sets - 10 reps Standing Hip Extension with Counter Support - 1 x daily - 7 x weekly - 3 sets - 10 reps Supine Bridge - 1 x daily - 7 x weekly - 3 sets - 10 reps - 3 sec hold

## 2020-12-28 NOTE — Therapy (Signed)
[place]. [place], [place], [address] Phone: [phone]   Fax:  [phone]  Physical Therapy Evaluation  Patient Details  Name: [name] MRN: [id number] Date of Birth: Jan 12, 1941 Referring Provider (PT): Marikay Alar Date: 12/28/2020   PT End of Session - 12/28/20 1003    Visit Number 1    Date for PT Re-Evaluation 02/27/21    PT Start Time 0927    PT Stop Time 1004    PT Time Calculation (min) 37 min    Activity Tolerance Patient tolerated treatment well    Behavior During Therapy [facility] for tasks assessed/performed           Past Medical History:  Diagnosis Date  . Anemia   . Barrett's esophagus   . Blood transfusion without reported diagnosis   . CAD (coronary artery disease)    2002 stenting of the RCA,, 2006 Stenting of diag with DES, PTCA of Circ, 50% LAD  . Cancer ([place])    skin cancer scalp and forehead  . 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 Diverticulosis   . DJD (degenerative joint disease)   . Duodenal ulcer   . ED (erectile dysfunction)   . GERD (gastroesophageal reflux disease)    increased s/s in the last week 03-22-19 PV   . Hemorrhoids   . Hyperlipidemia   . Hypertension   . Myocardial infarction ([place])    2002  . 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   mid frontal scalp-cx51fu  . SCC (squamous cell carcinoma) 06/15/2019   front middle scalp-tx cx51fu  . SCC (squamous cell carcinoma) 12/27/2002   CIS-right angle of jaw--cx39fu  . SCC (squamous cell carcinoma) 09/24/2004   CIS--mid post crown, left mis crown medial,left first web space-tx cx76fu  . SCC (squamous cell carcinoma) 06/03/2006   SCCA-right temple-tx cx67fu  . SCC (squamous cell carcinoma) 06/03/2006   CIS-right forehead, left mis crown scalp medial-tx cx57fu  . 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/30/2006   right temple lateral-txpbx  . SCC (squamous  cell carcinoma) 11/10/2006   left forehead, left hand proximal  . 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/02/2009   left scalp-cx92fu  . SCC (squamous cell carcinoma) 01/24/2015   left scalp-cx27fu  . SCC (squamous cell carcinoma) 11/12/2015   front lateralscalp-cx19fu, medial scalp-cx48fu  . SCC (squamous cell carcinoma) 12/17/2016   front center scalp-cx65fu  . Squamous cell carcinoma of skin 08/10/2017   CIS--right ear rim, top of scalp, left side scalp-tx-cx72fu    Past Surgical History:  Procedure Laterality Date  . COLONOSCOPY    . ELBOW ARTHROSCOPY    . heart stents     2002,2004  . SPINAL FUSION    . SPINAL FUSION    . SPINE SURGERY     cervical  . UPPER GASTROINTESTINAL ENDOSCOPY      There were no vitals filed for this visit.    Subjective Assessment - 12/28/20 0927    Subjective Pt presents to clinic with c/o B hip pain and L knee pain present for several weeks. Pt states that his hips were hurting mainly in the mornings and they get better throughout the day. Pt states that he was having trouble sleeping on his side, especially L side, due to hip pain. Pt had a course of prednisone and states that this has helped with hip pain but not his knee  pain. Pt reports that stairs, prolonged standing, and walking all cause pain in L knee. Pt states that injections and gel therapy were helpful for R knee when that was giving him trouble.    Pertinent History CAD, CKD, HTN    Limitations Standing;Walking    Patient Stated Goals reduce pain    Currently in Pain? Yes    Pain Score 5     Pain Location Knee    Pain Orientation Left    Pain Descriptors / Indicators Aching;Stabbing    Pain Type Acute pain    Pain Onset 1 to 4 weeks ago    Pain Frequency Intermittent    Aggravating Factors  reports stairs are primary aggravating factor    Pain Relieving Factors rest, walking    Multiple Pain Sites Yes    Pain Score 2    Pain Location Hip    Pain Orientation Left;Right   primarily L    Pain Descriptors / Indicators Dull    Pain Type Acute pain    Pain Onset 1 to 4 weeks ago    Pain Frequency Intermittent    Aggravating Factors  lying on side    Pain Relieving Factors rest, change in position              [facility] PT Assessment - 12/28/20 0001      Assessment   Medical Diagnosis B hip trochanteric bursitis and L knee pain    Referring Provider (PT) Georgina Snell    Onset Date/Surgical Date --   ~3 weeks ago   Next MD Visit 01/24/2021    Prior Therapy none      Precautions   Precautions None      Restrictions   Weight Bearing Restrictions No      Balance Screen   Has the patient fallen in the past 6 months No    Has the patient had a decrease in activity level because of a fear of falling?  No    Is the patient reluctant to leave their home because of a fear of falling?  No      Home Environment   Additional Comments reports no stairs at home; does state he is having trouble with stairs      Prior Function   Level of Independence Independent    Vocation Retired    Leisure walking, goes to gym in [employer]      Functional Tests   Functional tests Sit to Stand      Sit to Stand   Comments difficulty with eccentric control; mild pain reported in L knee with STS from standard chair      ROM / Strength   AROM / PROM / Strength AROM;Strength      AROM   Overall AROM Comments WFL B knee ext/flex      Strength   Overall Strength Comments B hip ext/abd 4-/5, L knee ext/flex 4-/5 d/t pain      Flexibility   Soft Tissue Assessment /Muscle Length yes    Hamstrings very tight L>R    ITB tight    Piriformis tight      Palpation   Palpation comment tender to palpation B greater troch/glute med, L knee joint line, L hamstring tendons      Special Tests   Other special tests + SLR 70 deg BLE      Ambulation/Gait   Gait Comments antalgic gait with slightly  widened BOS and decreased stance time LLE       Standardized Balance Assessment   Standardized Balance Assessment Five Times Sit to Stand    Five times sit to stand comments  15.82 sec                      Objective measurements completed on examination: See above findings.               PT Education - 12/28/20 1003    Education Details Pt educated on POC and HEP    Person(s) Educated Patient    Methods Explanation;Demonstration;Handout    Comprehension Verbalized understanding;Returned demonstration            PT Short Term Goals - 12/28/20 1113      PT SHORT TERM GOAL #1   Title Pt will be I with initial HEP    Time 2    Period Weeks    Status New    Target Date 01/11/21             PT Long Term Goals - 12/28/20 1114      PT LONG TERM GOAL #1   Title Pt will be I with advanced HEP    Time 6    Period Weeks    Status New    Target Date 02/08/21      PT LONG TERM GOAL #2   Title Pt will demo able to ascend/descend stairs with no increase in L knee pain    Time 6    Period Weeks    Status New    Target Date 02/08/21      PT LONG TERM GOAL #3   Title Pt will report 50% reduction in L knee pain    Time 6    Period Weeks    Status New    Target Date 02/08/21      PT LONG TERM GOAL #4   Title Pt will demo B hip ext/abd MMT >/= 4+/5 with no increase in hip pain    Time 6    Period Weeks    Status New    Target Date 02/08/21                  Plan - 12/28/20 1021    Clinical Impression Statement Pt presents to clinic with reports of acute onset B hip (L>R) and chronic L knee pain, no known MOI. Diagnosed with B trochanteric bursitis/hip abductor tendionpathy; given course of prednisone which has helped some. Pt demos B TTP greater troch, pain with hip abduction, and hip weakness on MMT all L>R. Pt also demos L knee joint line tenderness along with weakness of L knee ext/flex d/t pain with MMT testing. Pt demos mild antalgic gait with decreased stance time LLE, pain with  stairs, decreased eccentric control with STS, and L knee pain rising from low surfaces. Pt would like to be able to get up/down off floor without pain to play with grandkids. Pt would benefit from skilled PT to address the above impairments.    Examination-Activity Limitations Stairs;Stand;Locomotion Level    Examination-Participation Restrictions Community Activity;Interpersonal Relationship    Stability/Clinical Decision Making Stable/Uncomplicated    Clinical Decision Making Low    Rehab Potential Good    PT Frequency 2x / week    PT Duration 6 weeks    PT Treatment/Interventions ADLs/Self Care Home Management;Electrical Stimulation;Iontophoresis 4mg /ml Dexamethasone;Moist Heat;Gait training;Stair training;Therapeutic activities;Therapeutic exercise;Balance training;Neuromuscular re-education;Patient/family education;Manual techniques;Passive range  of motion;Dry needling    PT Next Visit Plan hip abd/ext strengthening within painfree range, STM/stretching hips, knee strengthening, stair progression, review/progress HEP    PT Home Exercise Plan see pt instructions    Consulted and Agree with Plan of Care Patient           Patient will benefit from skilled therapeutic intervention in order to improve the following deficits and impairments:  Abnormal gait,Difficulty walking,Increased muscle spasms,Decreased activity tolerance,Pain,Decreased balance,Hypomobility,Decreased strength  Visit Diagnosis: Chronic pain of left knee  Pain in left hip  Pain in right hip  Cramp and spasm     Problem List Patient Active Problem List   Diagnosis Date Noted  . Bilateral hip pain 12/05/2020  . Foot swelling 10/05/2020  . Drug-induced hyperkalemia 03/12/2020  . Educated about COVID-19 virus infection 10/19/2019  . Coarse tremors 06/10/2016  . Routine general medical examination at a health care facility 02/07/2016  . CAD (coronary artery disease) 05/01/2015  . CKD (chronic kidney disease),  stage III ([place]) 05/01/2015  . 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 BARRETTS ESOPHAGUS 01/24/2010  . 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 Hyperlipidemia with target LDL less than 70 09/21/2007  . Chronic ischemic heart disease 09/21/2007  . ERECTILE DYSFUNCTION 03/09/2007  . Essential hypertension 03/09/2007   Amador Cunas, PT, DPT Donald Prose Vennessa Affinito 12/28/2020, 11:21 AM  [place]. [place], [place], [address] Phone: [phone]   Fax:  [phone]  Name: [name] MRN: [id number] Date of Birth: 25-Jul-1941

## 2021-01-03 ENCOUNTER — Ambulatory Visit (INDEPENDENT_AMBULATORY_CARE_PROVIDER_SITE_OTHER): Payer: PPO | Admitting: Pharmacist

## 2021-01-03 ENCOUNTER — Other Ambulatory Visit: Payer: Self-pay

## 2021-01-03 DIAGNOSIS — I2510 Atherosclerotic heart disease of native coronary artery without angina pectoris: Secondary | ICD-10-CM | POA: Diagnosis not present

## 2021-01-03 DIAGNOSIS — K219 Gastro-esophageal reflux disease without esophagitis: Secondary | ICD-10-CM

## 2021-01-03 DIAGNOSIS — E785 Hyperlipidemia, unspecified: Secondary | ICD-10-CM | POA: Diagnosis not present

## 2021-01-03 DIAGNOSIS — G252 Other specified forms of tremor: Secondary | ICD-10-CM

## 2021-01-03 DIAGNOSIS — I10 Essential (primary) hypertension: Secondary | ICD-10-CM | POA: Diagnosis not present

## 2021-01-03 DIAGNOSIS — N1832 Chronic kidney disease, stage 3b: Secondary | ICD-10-CM

## 2021-01-03 NOTE — Progress Notes (Signed)
Chronic Care Management Pharmacy Note  01/03/2021 Name:  [name] MRN:  [id number] DOB:  1940/10/03  Subjective: [name] is an 80 y.o. year old male who is a primary patient of [name], MD.  The CCM team was consulted for assistance with disease management and care coordination needs.    Engaged with patient by telephone for follow up visit in response to provider referral for pharmacy case management and/or care coordination services.   Consent to Services:  The patient was given the following information about Chronic Care Management services today, agreed to services, and gave verbal consent: 1. CCM service includes personalized support from designated clinical staff supervised by the primary care provider, including individualized plan of care and coordination with other care providers 2. 24/7 contact phone numbers for assistance for urgent and routine care needs. 3. Service will only be billed when office clinical staff spend 20 minutes or more in a month to coordinate care. 4. Only one practitioner may furnish and bill the service in a calendar month. 5.The patient may stop CCM services at any time (effective at the end of the month) by phone call to the office staff. 6. The patient will be responsible for cost sharing (co-pay) of up to 20% of the service fee (after annual deductible is met). Patient agreed to services and consent obtained.  Patient Care Team: [name], MD as PCP - General (Internal Medicine) Minus Breeding, MD as PCP - Cardiology (Cardiology) Lavonna Monarch, MD as Consulting Physician (Dermatology) Tat, Eustace Quail, DO as Consulting Physician (Neurology) Charlton Haws, [facility] as Pharmacist (Pharmacist)  Recent office visits: 12/05/20 Dr Jenny Reichmann OV: hip pain, referred to sports med. Rx'd prednisone burst.  11/26/20 NP Jodi Mourning: c/o parotitis - rx'd Augmentin  11/07/20 Dr Ronnald Ramp OV: chronic f/u, no med changes  Recent consult  visits: 12/05/20 Dr Georgina Snell (sports med):   Hospital visits: None in previous 6 months  Objective:  Lab Results  Component Value Date   CREATININE 1.25 10/04/2020   BUN 16 10/04/2020   GFR 53.79 (L) 10/04/2020   GFRNONAA 57 (L) 01/26/2020   GFRAA 66 01/26/2020   NA 140 10/04/2020   K 5.0 10/04/2020   CALCIUM 10.3 10/04/2020   CO2 33 (H) 10/04/2020   GLUCOSE 84 10/04/2020    Lab Results  Component Value Date/Time   GFR 53.79 (L) 10/04/2020 02:03 PM   GFR 58.75 (L) 03/10/2019 10:32 AM    Last diabetic Eye exam: No results found for: HMDIABEYEEXA  Last diabetic Foot exam: No results found for: HMDIABFOOTEX   Lab Results  Component Value Date   CHOL 124 03/12/2020   HDL 44 03/12/2020   LDLCALC 60 03/12/2020   TRIG 121 03/12/2020   CHOLHDL 2.8 03/12/2020    Hepatic Function Latest Ref Rng & Units 10/04/2020 03/12/2020 03/10/2019  Total Protein 6.0 - 8.3 g/dL 7.5 6.9 7.1  Albumin 3.5 - 5.2 g/dL 4.3 - 4.4  AST 0 - 37 U/L '21 20 18  ' ALT 0 - 53 U/L '20 17 16  ' Alk Phosphatase 39 - 117 U/L 69 - 68  Total Bilirubin 0.2 - 1.2 mg/dL 0.4 0.5 0.6  Bilirubin, Direct 0.0 - 0.2 mg/dL - 0.1 0.1    Lab Results  Component Value Date/Time   TSH 0.73 03/12/2020 11:44 AM   TSH 0.83 03/10/2019 10:32 AM    CBC Latest Ref Rng & Units 03/12/2020 03/10/2019 03/08/2018  WBC 3.8 - 10.8 Thousand/uL 7.9 8.0  8.6  Hemoglobin 13.2 - 17.1 g/dL 14.2 14.8 14.7  Hematocrit 38.5 - 50.0 % 42.9 42.2 43.9  Platelets 140 - 400 Thousand/uL 217 207.0 238.0    No results found for: VD25OH  Clinical ASCVD: Yes  The ASCVD Risk score Mikey Bussing DC Jr., et al., 2013) failed to calculate for the following reasons:   The 2013 ASCVD risk score is only valid for ages 90 to 47    Depression screen PHQ 2/9 10/04/2020 04/14/2019 03/10/2019  Decreased Interest 0 0 0  Down, Depressed, Hopeless 0 0 0  PHQ - 2 Score 0 0 0  Altered sleeping - - -  Tired, decreased energy - - -  Change in appetite - - -  Feeling bad or  failure about yourself  - - -  Trouble concentrating - - -  Moving slowly or fidgety/restless - - -  Suicidal thoughts - - -  PHQ-9 Score - - -      Social History   Tobacco Use  Smoking Status Former Smoker  . Quit date: 08/25/1961  . Years since quitting: 59.4  Smokeless Tobacco Never Used   BP Readings from Last 3 Encounters:  12/05/20 110/70  12/05/20 124/82  11/26/20 110/60   Pulse Readings from Last 3 Encounters:  12/05/20 (!) 53  12/05/20 (!) 55  11/26/20 (!) 54   Wt Readings from Last 3 Encounters:  12/05/20 177 lb (80.3 kg)  12/05/20 175 lb (79.4 kg)  11/26/20 175 lb (79.4 kg)   BMI Readings from Last 3 Encounters:  12/05/20 28.57 kg/m  12/05/20 28.25 kg/m  11/26/20 28.25 kg/m    Assessment/Interventions: Review of patient past medical history, allergies, medications, health status, including review of consultants reports, laboratory and other test data, was performed as part of comprehensive evaluation and provision of chronic care management services.   SDOH:  (Social Determinants of Health) assessments and interventions performed: Yes  SDOH Screenings   Alcohol Screen: Not on file  Depression (PHQ2-9): Low Risk   . PHQ-2 Score: 0  Financial Resource Strain: Low Risk   . Difficulty of Paying Living Expenses: Not hard at all  Food Insecurity: Not on file  Housing: Not on file  Physical Activity: Not on file  Social Connections: Not on file  Stress: Not on file  Tobacco Use: Medium Risk  . Smoking Tobacco Use: Former Smoker  . Smokeless Tobacco Use: Never Used  Transportation Needs: Not on file    [name]  No Known Allergies  Medications Reviewed Today    Reviewed by Charlton Haws, [facility] (Pharmacist) on 01/03/21 at 1346  Med List Status: <None>  Medication Order Taking? Sig Documenting Provider Last Dose Status Informant  amLODipine (NORVASC) 10 MG tablet [id number]  Take 1 tablet (10 mg total) by mouth daily. Minus Breeding, MD   Expired 08/09/20 2359   atenolol (TENORMIN) 50 MG tablet [id number] Yes Take 1 tablet (50 mg total) by mouth 2 (two) times daily. [name], MD Taking Active   atorvastatin (LIPITOR) 20 MG tablet [id number] Yes Take 1 tablet (20 mg total) by mouth daily. Minus Breeding, MD Taking Active   Coenzyme Q10 (CO Q 10 PO) [id number] Yes Take 1 tablet by mouth daily. [provider] Taking Active Self  hydrALAZINE (APRESOLINE) 25 MG tablet [id number] Yes TAKE 1 TABLET BY MOUTH IN THE MORNING AND AT BEDTIME. Minus Breeding, MD Taking Active   KRILL OIL PO [id number] Yes Take 1 tablet by mouth daily. [provider] Taking Active Self  lisinopril (ZESTRIL) 20 MG tablet [id number] Yes Take 1 tablet (20 mg total) by mouth daily. Minus Breeding, MD Taking Active   Multiple Vitamins-Minerals (CENTRUM SILVER 50+MEN PO) [id number] Yes Take 1 tablet by mouth. [provider] Taking Active Self  nitroGLYCERIN (NITROSTAT) 0.4 MG SL tablet [id number] Yes PLACE 1 TABLET UNDER THE TONGUE EVERY 5 MINUTES AS NEEDED. [name], MD Taking Active   pantoprazole (PROTONIX) 40 MG tablet [id number] Yes Take 1 tablet (40 mg total) by mouth daily. [name], MD Taking Active   predniSONE (DELTASONE) 10 MG tablet [id number] Yes 2 tabs by mouth per day for 5 days Gregor Hams, MD Taking Active   primidone (MYSOLINE) 50 MG tablet [id number] Yes Take 1 tablet (50 mg total) by mouth 2 (two) times daily. [name], MD Taking Active           Patient Active Problem List   Diagnosis Date Noted  . Bilateral hip pain 12/05/2020  . Foot swelling 10/05/2020  . Drug-induced hyperkalemia 03/12/2020  . Educated about COVID-19 virus infection 10/19/2019  . Coarse tremors 06/10/2016  . Routine general medical examination at a health care facility 02/07/2016  . CAD (coronary artery disease) 05/01/2015  . CKD (chronic kidney disease), stage III ([place]) 05/01/2015  . BPH with obstruction/lower  urinary tract symptoms 11/13/2010  . BARRETTS ESOPHAGUS 01/24/2010  . 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 Hyperlipidemia with target LDL less than 70 09/21/2007  . Chronic ischemic heart disease 09/21/2007  . ERECTILE DYSFUNCTION 03/09/2007  . Essential hypertension 03/09/2007    Immunization History  Administered Date(s) Administered  . Fluad Quad(high Dose 65+) 05/07/2019, 06/14/2020  . H1N1 09/22/2008  . Influenza Split 05/18/2012  . Influenza Whole 08/25/2005, 06/08/2008, 07/04/2009, 06/24/2010  . Influenza, High Dose Seasonal PF 06/21/2015, 05/08/2016, 06/27/2017, 06/14/2018  . Influenza,inj,Quad PF,6+ Mos 07/20/2013, 06/23/2014  . PFIZER(Purple Top)SARS-COV-2 Vaccination 09/05/2019, 09/25/2019, 07/03/2020  . Pneumococcal Conjugate-13 01/18/2014  . Pneumococcal Polysaccharide-23 08/25/2002, 09/21/2007, 02/06/2016  . Td 08/25/2006  . Tdap 09/22/2016  . Zoster 10/20/2008    Conditions to be addressed/monitored:  Hypertension, Hyperlipidemia, Coronary Artery Disease and GERD, Essential tremor  Care Plan : [place]  Updates made by Charlton Haws, [place] since 01/03/2021 12:00 AM    Problem: Hypertension, Hyperlipidemia, Coronary Artery Disease and GERD, Essential tremor   Priority: High    Long-Range Goal: Disease management   Start Date: 01/03/2021  Expected End Date: 01/03/2022  This Visit's Progress: On track  Priority: High  Note:   Current Barriers:  . Unable to independently monitor therapeutic efficacy  Pharmacist Clinical Goal(s):  Marland Kitchen Patient will achieve adherence to monitoring guidelines and medication adherence to achieve therapeutic efficacy through collaboration with PharmD and provider.   Interventions: . 1:1 collaboration with [name], MD regarding development and update of comprehensive plan of care as evidenced by provider attestation and co-signature . Inter-disciplinary care team collaboration (see longitudinal plan  of care) . Comprehensive medication review performed; medication list updated in electronic medical record   Hypertension    BP goal is:  <130/80 Patient checks BP at home daily Patient home BP readings are ranging: 110s-120s/60-70s   Patient has failed these meds in the past: diltiazem, HCTZ, ramipril, spironolactone, verapamil Patient is currently controlled on the following medications:   Amlodipine 10 mg daily AM  Atenolol 50 mg BID  Hydralazine 25 mg BID  Lisinopril 20 mg daily PM   We  discussed: BP goals; pt has been to HTN clinic to adjust medications and current regimen achieved BP goal without side effects   Plan: Continue current medications and control with diet and exercise    Hyperlipidemia / CAD    LDL goal < 70 Hx CAD  Patient has failed these meds in past: n/a Patient is currently controlled on the following medications:   Atorvastatin 20 mg daily HS  Nitroglycerin 0.4 mg SL prn  Coenzyme Q10 200 mg daily AM  Krill oil AM   We discussed: Cholesterol goals; benefits of statin for ASCVD risk reduction; never had to use NTG but does keep a supply on hand   Plan: Continue current medications and control with diet and exercise   GERD / Barrett's esophagus    Patient has failed these meds in past: n/a Patient is currently controlled on the following medications:   Pantoprazole 40 mg daily (since 2006)   We discussed:  Patient is satisfied with current regimen and denies issues; started taking PPI after GI bleed in ~2006;  tries to avoid eating too late in the evening to prevent GERD   Plan: Continue current medications and control with diet and exercise   Essential tremor    Patient has failed these meds in past: n/a Patient is currently controlled on the following medications:   Primodone 50 mg BID   We discussed:  Pt reports tremors are well controlled; neurologist has said to follow up PRN   Plan: Continue current medications  Patient  Goals/Self-Care Activities . Patient will:  - take medications as prescribed focus on medication adherence by pill pack check blood pressure daily, document, and provide at future appointments  Follow Up Plan: Telephone follow up appointment with care management team member scheduled for: 1 year      Medication Assistance: None required.  Patient affirms current coverage meets needs.  Patient's preferred pharmacy is:  [facility] - [place], [place] - [address] [place] [address] Phone: [phone] Fax: [phone]  [id number] [id number] [place]: [phone]: [phone] Uses pill box? Yes - pill packs Pt endorses 100% compliance  We discussed: Current pharmacy is preferred with insurance plan and patient is satisfied with pharmacy services Patient decided to: Utilize [facility] for medication synchronization, packaging and delivery  Care Plan and Follow Up Patient Decision:  Patient agrees to Care Plan and Follow-up.  Plan: Telephone follow up appointment with care management team member scheduled for:  1 year  LCharlene Brooke PharmD, [place] CPP Clinical Pharmacist [phone]

## 2021-01-03 NOTE — Patient Instructions (Signed)
Visit Information  Phone number for Pharmacist: [phone]  Goals Addressed            This Visit's Progress   . Manage My Medicine       Timeframe:  Long-Range Goal Priority:  Medium Start Date:    01/03/21                         Expected End Date:   01/03/22                    Follow Up Date 07/24/21   - call for medicine refill 2 or 3 days before it runs out - call if I am sick and can't take my medicine - keep a list of all the medicines I take; vitamins and herbals too  -Utilize [facility] for medication synchronization, packaging and delivery   Why is this important?   . These steps will help you keep on track with your medicines.   Notes:       Patient verbalizes understanding of instructions provided today and agrees to view in [place].  Telephone follow up appointment with pharmacy team member scheduled for: 1 year  Charlene Brooke, PharmD, [place], CPP Clinical Pharmacist [place] [facility] [phone]

## 2021-01-08 ENCOUNTER — Ambulatory Visit: Payer: PPO | Admitting: Rehabilitative and Restorative Service Providers"

## 2021-01-08 ENCOUNTER — Other Ambulatory Visit: Payer: Self-pay

## 2021-01-08 ENCOUNTER — Encounter: Payer: Self-pay | Admitting: Rehabilitative and Restorative Service Providers"

## 2021-01-08 DIAGNOSIS — G8929 Other chronic pain: Secondary | ICD-10-CM

## 2021-01-08 DIAGNOSIS — M25552 Pain in left hip: Secondary | ICD-10-CM

## 2021-01-08 DIAGNOSIS — M25551 Pain in right hip: Secondary | ICD-10-CM

## 2021-01-08 DIAGNOSIS — M25562 Pain in left knee: Secondary | ICD-10-CM | POA: Diagnosis not present

## 2021-01-08 NOTE — Therapy (Signed)
[place]. [place], [place], [address] Phone: [phone]   Fax:  [phone]  Physical Therapy Treatment  Patient Details  Name: [name] MRN: [id number] Date of Birth: 04-17-1941 Referring Provider (PT): Marikay Alar Date: 01/08/2021   PT End of Session - 01/08/21 1014    Visit Number 2    Date for PT Re-Evaluation 02/27/21    PT Start Time 0930    PT Stop Time 1010    PT Time Calculation (min) 40 min    Activity Tolerance Patient tolerated treatment well    Behavior During Therapy [facility] for tasks assessed/performed           Past Medical History:  Diagnosis Date  . Anemia   . Barrett's esophagus   . Blood transfusion without reported diagnosis   . CAD (coronary artery disease)    2002 stenting of the RCA,, 2006 Stenting of diag with DES, PTCA of Circ, 50% LAD  . Cancer ([place])    skin cancer scalp and forehead  . 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 Diverticulosis   . DJD (degenerative joint disease)   . Duodenal ulcer   . ED (erectile dysfunction)   . GERD (gastroesophageal reflux disease)    increased s/s in the last week 03-22-19 PV   . Hemorrhoids   . Hyperlipidemia   . Hypertension   . Myocardial infarction ([place])    2002  . 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   mid frontal scalp-cx54f  . SCC (squamous cell carcinoma) 06/15/2019   front middle scalp-tx cx39f . SCC (squamous cell carcinoma) 12/27/2002   CIS-right angle of jaw--cx3553f. SCC (squamous cell carcinoma) 09/24/2004   CIS--mid post crown, left mis crown medial,left first web space-tx cx35f25f SCC (squamous cell carcinoma) 06/03/2006   SCCA-right temple-tx [id number] (squamous cell carcinoma) 06/03/2006   CIS-right forehead, left mis crown scalp medial-tx cx35fu 57fCC (squamous cell carcinoma) 07/30/2006   right temple lateral-txpbx  . SCC (squamous  cell carcinoma) 11/10/2006   left forehead, left hand proximal  . 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/02/2009   left scalp-cx35fu  54fC (squamous cell carcinoma) 01/24/2015   left scalp-cx35fu  .7f (squamous cell carcinoma) 11/12/2015   front lateralscalp-cx35fu, me62f scalp-cx35fu  . S59fsquamous cell carcinoma) 12/17/2016   front center scalp-cx35fu  . Sq73fus cell carcinoma of skin 08/10/2017   CIS--right ear rim, top of scalp, left side scalp-tx-cx35fu    Pas4frgical History:  Procedure Laterality Date  . COLONOSCOPY    . ELBOW ARTHROSCOPY    . heart stents     2002,2004  . SPINAL FUSION    . SPINAL FUSION    . SPINE SURGERY     cervical  . UPPER GASTROINTESTINAL ENDOSCOPY      There were no vitals filed for this visit.   Subjective Assessment - 01/08/21 1009    Subjective Patient states that he has been doing his HEP and he thinks that it is really helping.  I can get in and out of the car easier.    Pertinent History CAD, CKD, HTN    Patient Stated Goals reduce pain    Currently in Pain? Yes    Pain Score 4     Pain Location Hip    Pain Orientation Right;Left  Pain Descriptors / Indicators Aching;Dull                             [facility] Adult PT Treatment/Exercise - 01/08/21 0001      Exercises   Exercises Knee/Hip      Knee/Hip Exercises: Aerobic   Recumbent Bike L 2.0, x6 min      Knee/Hip Exercises: Machines for Strengthening   Cybex Knee Extension 20# 2x10    Cybex Knee Flexion 25# 2x10    Cybex Leg Press 20# 2x10      Knee/Hip Exercises: Standing   Lateral Step Up Both;1 set;10 reps;Hand Hold: 1;Step Height: 6"    Forward Step Up Both;1 set;10 reps;Hand Hold: 2;Step Height: 6"    Walking with Sports Cord 20# 4 way x5 each      Knee/Hip Exercises: Seated   Hamstring Curl Both;2 sets;10 reps    Hamstring Limitations green theraband    Sit to Sand 2 sets;10 reps                    PT Short Term Goals - 01/08/21  1022      PT SHORT TERM GOAL #1   Title Pt will be I with initial HEP    Status Partially Met             PT Long Term Goals - 12/28/20 1114      PT LONG TERM GOAL #1   Title Pt will be I with advanced HEP    Time 6    Period Weeks    Status New    Target Date 02/08/21      PT LONG TERM GOAL #2   Title Pt will demo able to ascend/descend stairs with no increase in L knee pain    Time 6    Period Weeks    Status New    Target Date 02/08/21      PT LONG TERM GOAL #3   Title Pt will report 50% reduction in L knee pain    Time 6    Period Weeks    Status New    Target Date 02/08/21      PT LONG TERM GOAL #4   Title Pt will demo B hip ext/abd MMT >/= 4+/5 with no increase in hip pain    Time 6    Period Weeks    Status New    Target Date 02/08/21                 Plan - 01/08/21 1019    Clinical Impression Statement Patient is progressing with goal related activities and tolerated session well.  He requires some cuing during exercises to slow down to improve the eccentric muscle contraction.  He required SBA with R side stepping with sports cord due to instability.  He has a great attitude throughout the session.  He continues to require skilled PT to progress towards goal related activities.    Personal Factors and Comorbidities Age    Examination-Activity Limitations Stairs;Stand;Locomotion Level    PT Treatment/Interventions ADLs/Self Care Home Management;Electrical Stimulation;Iontophoresis 72m/ml Dexamethasone;Moist Heat;Gait training;Stair training;Therapeutic activities;Therapeutic exercise;Balance training;Neuromuscular re-education;Patient/family education;Manual techniques;Passive range of motion;Dry needling    PT Next Visit Plan hip abd/ext strengthening within painfree range, STM/stretching hips, knee strengthening, stair progression, review/progress HEP    Consulted and Agree with Plan of Care Patient           Patient  will benefit from skilled  therapeutic intervention in order to improve the following deficits and impairments:  Abnormal gait,Difficulty walking,Increased muscle spasms,Decreased activity tolerance,Pain,Decreased balance,Hypomobility,Decreased strength  Visit Diagnosis: Chronic pain of left knee  Pain in left hip  Pain in right hip     Problem List Patient Active Problem List   Diagnosis Date Noted  . Bilateral hip pain 12/05/2020  . Foot swelling 10/05/2020  . Drug-induced hyperkalemia 03/12/2020  . Educated about COVID-19 virus infection 10/19/2019  . Coarse tremors 06/10/2016  . Routine general medical examination at a health care facility 02/07/2016  . CAD (coronary artery disease) 05/01/2015  . CKD (chronic kidney disease), stage III ([place]) 05/01/2015  . 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 BARRETTS ESOPHAGUS 01/24/2010  . 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 Hyperlipidemia with target LDL less than 70 09/21/2007  . Chronic ischemic heart disease 09/21/2007  . ERECTILE DYSFUNCTION 03/09/2007  . Essential hypertension 03/09/2007    Juel Burrow, PT, DPT 01/08/2021, 10:23 AM  [place]. [place], [place], [address] Phone: [phone]   Fax:  [phone]  Name: ABISHAI VIEGAS MRN: [id number] Date of Birth: August 31, 1940

## 2021-01-10 ENCOUNTER — Other Ambulatory Visit: Payer: Self-pay

## 2021-01-10 ENCOUNTER — Encounter: Payer: Self-pay | Admitting: Rehabilitative and Restorative Service Providers"

## 2021-01-10 ENCOUNTER — Ambulatory Visit: Payer: PPO | Admitting: Rehabilitative and Restorative Service Providers"

## 2021-01-10 DIAGNOSIS — G8929 Other chronic pain: Secondary | ICD-10-CM

## 2021-01-10 DIAGNOSIS — M25552 Pain in left hip: Secondary | ICD-10-CM

## 2021-01-10 DIAGNOSIS — M25562 Pain in left knee: Secondary | ICD-10-CM

## 2021-01-10 DIAGNOSIS — M25551 Pain in right hip: Secondary | ICD-10-CM

## 2021-01-10 NOTE — Therapy (Signed)
[place]. [place], [place], [address] Phone: [phone]   Fax:  [phone]  Physical Therapy Treatment  Patient Details  Name: [name] MRN: [id number] Date of Birth: Nov 10, 1940 Referring Provider (PT): Marikay Alar Date: 01/10/2021   PT End of Session - 01/10/21 0109    Visit Number 3    Date for PT Re-Evaluation 02/27/21    PT Start Time 1229    PT Stop Time 1315    PT Time Calculation (min) 46 min    Activity Tolerance Patient tolerated treatment well    Behavior During Therapy [facility] for tasks assessed/performed           Past Medical History:  Diagnosis Date  . Anemia   . Barrett's esophagus   . Blood transfusion without reported diagnosis   . CAD (coronary artery disease)    2002 stenting of the RCA,, 2006 Stenting of diag with DES, PTCA of Circ, 50% LAD  . Cancer ([place])    skin cancer scalp and forehead  . 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 Diverticulosis   . DJD (degenerative joint disease)   . Duodenal ulcer   . ED (erectile dysfunction)   . GERD (gastroesophageal reflux disease)    increased s/s in the last week 03-22-19 PV   . Hemorrhoids   . Hyperlipidemia   . Hypertension   . Myocardial infarction ([place])    2002  . 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   mid frontal scalp-cx39fu  . SCC (squamous cell carcinoma) 06/15/2019   front middle scalp-tx cx29fu  . SCC (squamous cell carcinoma) 12/27/2002   CIS-right angle of jaw--cx62fu  . SCC (squamous cell carcinoma) 09/24/2004   CIS--mid post crown, left mis crown medial,left first web space-tx cx32fu  . SCC (squamous cell carcinoma) 06/03/2006   SCCA-right temple-tx cx54fu  . SCC (squamous cell carcinoma) 06/03/2006   CIS-right forehead, left mis crown scalp medial-tx cx54fu  . 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/30/2006   right temple lateral-txpbx  . SCC (squamous  cell carcinoma) 11/10/2006   left forehead, left hand proximal  . 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/02/2009   left scalp-cx37fu  . SCC (squamous cell carcinoma) 01/24/2015   left scalp-cx53fu  . SCC (squamous cell carcinoma) 11/12/2015   front lateralscalp-cx19fu, medial scalp-cx72fu  . SCC (squamous cell carcinoma) 12/17/2016   front center scalp-cx49fu  . Squamous cell carcinoma of skin 08/10/2017   CIS--right ear rim, top of scalp, left side scalp-tx-cx76fu    Past Surgical History:  Procedure Laterality Date  . COLONOSCOPY    . ELBOW ARTHROSCOPY    . heart stents     2002,2004  . SPINAL FUSION    . SPINAL FUSION    . SPINE SURGERY     cervical  . UPPER GASTROINTESTINAL ENDOSCOPY      There were no vitals filed for this visit.   Subjective Assessment - 01/10/21 1240    Subjective I am doing well.  I coughed yesterday and hurt my side, but my hip feels great.    Pertinent History CAD, CKD, HTN    Limitations Standing;Walking    Patient Stated Goals reduce pain    Currently in Pain? No/denies  [place] Adult PT Treatment/Exercise - 01/10/21 0001      Transfers   Five time sit to stand comments  8.72 sec      Knee/Hip Exercises: Aerobic   Recumbent Bike L 2.5, x6 min      Knee/Hip Exercises: Machines for Strengthening   Cybex Knee Extension 20# 2x10    Cybex Knee Flexion 25# 2x10    Cybex Leg Press 30# 2x12      Knee/Hip Exercises: Standing   Lateral Step Up Both;10 reps;Hand Hold: 1;Step Height: 6"    Forward Step Up Both;1 set;10 reps;Hand Hold: 1;Step Height: 6"    Walking with Sports Cord 30# 4 way x5 each      Knee/Hip Exercises: Seated   Hamstring Curl Both;2 sets;10 reps    Hamstring Limitations green theraband    Sit to Sand 2 sets;10 reps   x10 with chest press with yellow ball, x10 with overhead press with yellow ball                   PT Short Term Goals - 01/10/21 1316      PT SHORT TERM  GOAL #1   Title Pt will be I with initial HEP    Status Achieved             PT Long Term Goals - 01/10/21 1316      PT LONG TERM GOAL #1   Title Pt will be I with advanced HEP    Status On-going      PT LONG TERM GOAL #2   Title Pt will demo able to ascend/descend stairs with no increase in L knee pain    Status On-going      PT LONG TERM GOAL #3   Title Pt will report 50% reduction in L knee pain    Status On-going      PT LONG TERM GOAL #4   Title Pt will demo B hip ext/abd MMT >/= 4+/5 with no increase in hip pain    Status On-going                 Plan - 01/10/21 1248    Clinical Impression Statement Patient is progressing with goal related activities.  He requires cuing to slow down during exercises to increase eccentric muscle contraction/strengthening.  He is improving with his strength and decreased pain.  Educated pt about safety with ambulation [place] the beach while [place] is down there for a family reunion.  He continues to require skilled PT to progress towards goal related activities.    PT Treatment/Interventions ADLs/Self Care Home Management;Electrical Stimulation;Iontophoresis 4mg /ml Dexamethasone;Moist Heat;Gait training;Stair training;Therapeutic activities;Therapeutic exercise;Balance training;Neuromuscular re-education;Patient/family education;Manual techniques;Passive range of motion;Dry needling    PT Next Visit Plan hip abd/ext strengthening within painfree range, STM/stretching hips, knee strengthening, stair progression, review/progress HEP    Consulted and Agree with Plan of Care Patient           Patient will benefit from skilled therapeutic intervention in order to improve the following deficits and impairments:  Abnormal gait,Difficulty walking,Increased muscle spasms,Decreased activity tolerance,Pain,Decreased balance,Hypomobility,Decreased strength  Visit Diagnosis: Chronic pain of left knee  Pain in left hip  Pain in right  hip     Problem List Patient Active Problem List   Diagnosis Date Noted  . Bilateral hip pain 12/05/2020  . Foot swelling 10/05/2020  . Drug-induced hyperkalemia 03/12/2020  . Educated about COVID-19 virus infection 10/19/2019  . Coarse tremors 06/10/2016  . Routine general  medical examination at a health care facility 02/07/2016  . CAD (coronary artery disease) 05/01/2015  . CKD (chronic kidney disease), stage III ([place]) 05/01/2015  . 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 BARRETTS ESOPHAGUS 01/24/2010  . 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 Hyperlipidemia with target LDL less than 70 09/21/2007  . Chronic ischemic heart disease 09/21/2007  . ERECTILE DYSFUNCTION 03/09/2007  . Essential hypertension 03/09/2007    Juel Burrow, PT, DPT 01/10/2021, 1:18 PM  [place]. [place], [place], [address] Phone: [phone]   Fax:  [phone]  Name: [name] MRN: [id number] Date of Birth: [birth year]

## 2021-01-22 ENCOUNTER — Other Ambulatory Visit: Payer: Self-pay

## 2021-01-22 ENCOUNTER — Ambulatory Visit: Payer: PPO | Admitting: Physical Therapy

## 2021-01-22 DIAGNOSIS — M25551 Pain in right hip: Secondary | ICD-10-CM

## 2021-01-22 DIAGNOSIS — M25552 Pain in left hip: Secondary | ICD-10-CM

## 2021-01-22 DIAGNOSIS — M25562 Pain in left knee: Secondary | ICD-10-CM | POA: Diagnosis not present

## 2021-01-22 DIAGNOSIS — G8929 Other chronic pain: Secondary | ICD-10-CM

## 2021-01-22 NOTE — Therapy (Signed)
[place]. [place], [place], [address] Phone: [phone]   Fax:  [phone]  Physical Therapy Treatment  Patient Details  Name: [name] MRN: [id number] Date of Birth: 1941/02/28 Referring Provider (PT): Marikay Alar Date: 01/22/2021   PT End of Session - 01/22/21 1128    Visit Number 4    Date for PT Re-Evaluation 02/27/21    PT Start Time 1055    PT Stop Time 1135    PT Time Calculation (min) 40 min           Past Medical History:  Diagnosis Date  . Anemia   . Barrett's esophagus   . Blood transfusion without reported diagnosis   . CAD (coronary artery disease)    2002 stenting of the RCA,, 2006 Stenting of diag with DES, PTCA of Circ, 50% LAD  . Cancer ([place])    skin cancer scalp and forehead  . 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 Diverticulosis   . DJD (degenerative joint disease)   . Duodenal ulcer   . ED (erectile dysfunction)   . GERD (gastroesophageal reflux disease)    increased s/s in the last week 03-22-19 PV   . Hemorrhoids   . Hyperlipidemia   . Hypertension   . Myocardial infarction ([place])    2002  . 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   mid frontal scalp-cx91f  . SCC (squamous cell carcinoma) 06/15/2019   front middle scalp-tx cx380f . SCC (squamous cell carcinoma) 12/27/2002   CIS-right angle of jaw--cx3543f. SCC (squamous cell carcinoma) 09/24/2004   CIS--mid post crown, left mis crown medial,left first web space-tx cx35f19f SCC (squamous cell carcinoma) 06/03/2006   SCCA-right temple-tx cx35fu35fSCC (squamous cell carcinoma) 06/03/2006   CIS-right forehead, left mis crown scalp medial-tx cx35fu 70fCC (squamous cell carcinoma) 07/30/2006   right temple lateral-txpbx  . SCC (squamous cell carcinoma) 11/10/2006   left forehead, left hand proximal  . 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/02/2009   left  scalp-cx35fu  87fC (squamous cell carcinoma) 01/24/2015   left scalp-cx35fu  .15f (squamous cell carcinoma) 11/12/2015   front lateralscalp-cx35fu, me55f scalp-cx35fu  . S5fsquamous cell carcinoma) 12/17/2016   front center scalp-cx35fu  . Sq41fus cell carcinoma of skin 08/10/2017   CIS--right ear rim, top of scalp, left side scalp-tx-cx35fu    Pas92frgical History:  Procedure Laterality Date  . COLONOSCOPY    . ELBOW ARTHROSCOPY    . heart stents     2002,2004  . SPINAL FUSION    . SPINAL FUSION    . SPINE SURGERY     cervical  . UPPER GASTROINTESTINAL ENDOSCOPY      There were no vitals filed for this visit.   Subjective Assessment - 01/22/21 1056    Subjective I  am doing really well. Vacation last week and did great 10,000 plus steps daily. knee and hips 95% better- still using Voltarin    Currently in Pain? No/denies                             [facility] - 01/22/21 0001      High Level Balance   High Level Balance Activities Tandem walking;Side stepping   on foam beam  Knee/Hip Exercises: Machines for Strengthening   Cybex Knee Extension 20# 2x10    Cybex Knee Flexion 25# 2x10    Cybex Leg Press 30# 3x12   feet 3 ways     Knee/Hip Exercises: Standing   Lateral Step Up Both;10 reps;Hand Hold: 2;Step Height: 6"   opp leg abd   Forward Step Up Both;10 reps;Hand Hold: 2;Step Height: 6"   opp leg ext   Walking with Sports Cord 30# fwd 3 x with Left foot tep up and 3 x with 6 foot step up 6 inch. 3 x each side withstep up   min A with 1 x RT knee gave way     Knee/Hip Exercises: Seated   Other Seated Knee/Hip Exercises long siiting SLR over 2 dumbells 10 BIL   very fatiguig for pt   Sit to Sand 10 reps;without UE support   on airex                   PT Short Term Goals - 01/10/21 1316      PT SHORT TERM GOAL #1   Title Pt will be I with initial HEP    Status Achieved             PT Long Term  Goals - 01/22/21 1057      PT LONG TERM GOAL #1   Title Pt will be I with advanced HEP    Status Partially Met      PT LONG TERM GOAL #2   Title Pt will demo able to ascend/descend stairs with no increase in L knee pain    Status Achieved      PT LONG TERM GOAL #3   Title Pt will report 50% reduction in L knee pain    Baseline 95%    Status Achieved      PT LONG TERM GOAL #4   Title Pt will demo B hip ext/abd MMT >/= 4+/5 with no increase in hip pain    Status Partially Met                 Plan - 01/22/21 1129    Clinical Impression Statement progressing with goals and doing very well. pain 95% better but pt did fatigue with ex esp side stepping and SLR over dumbells. decreased stabiity with adding step up to resisted gait.    PT Treatment/Interventions ADLs/Self Care Home Management;Electrical Stimulation;Iontophoresis 47m/ml Dexamethasone;Moist Heat;Gait training;Stair training;Therapeutic activities;Therapeutic exercise;Balance training;Neuromuscular re-education;Patient/family education;Manual techniques;Passive range of motion;Dry needling    PT Next Visit Plan assess hwo pt felt after session today with increased ther ex           Patient will benefit from skilled therapeutic intervention in order to improve the following deficits and impairments:  Abnormal gait,Difficulty walking,Increased muscle spasms,Decreased activity tolerance,Pain,Decreased balance,Hypomobility,Decreased strength  Visit Diagnosis: Chronic pain of left knee  Pain in right hip  Pain in left hip     Problem List Patient Active Problem List   Diagnosis Date Noted  . Bilateral hip pain 12/05/2020  . Foot swelling 10/05/2020  . Drug-induced hyperkalemia 03/12/2020  . Educated about COVID-19 virus infection 10/19/2019  . Coarse tremors 06/10/2016  . Routine general medical examination at a health care facility 02/07/2016  . CAD (coronary artery disease) 05/01/2015  . CKD (chronic  kidney disease), stage III ([place] 05/01/2015  . 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 BARRETTS ESOPHAGUS 01/24/2010  . 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 Hyperlipidemia  with target LDL less than 70 09/21/2007  . Chronic ischemic heart disease 09/21/2007  . ERECTILE DYSFUNCTION 03/09/2007  . Essential hypertension 03/09/2007    Catherin Doorn,ANGIE PTA 01/22/2021, 11:31 AM  [place]. [place], [place], [address] Phone: [phone]   Fax:  [phone]  Name: [name] MRN: [id number] Date of Birth: 1941-05-28

## 2021-01-23 NOTE — Progress Notes (Signed)
   I, Wendy Poet, LAT, ATC, am serving as scribe for Dr. Lynne Leader.  [name] is a 80 y.o. male who presents to [facility] today for f/u of B lateral hip pain.  He was last seen by Dr. Georgina Snell on 12/05/20 and was referred to PT of which he has completed 4 sessions.  He was also prescribed a short course of prednisone.  Since his last visit, pt reports that his hips are doing great.  He states that PT increased his routine on Tuesday and noted low back pain due to the change in his program.  He states that his low back is doing better than it was but notes that he is being "very gentle" w/ his back.  He locates his pain to his midline low back.  He denies any radiating pain into his legs.  He has been using Salonpas patches and has been sleeping in his recliner.  He has also been using a back brace and taking Tylenol.   Pertinent review of systems: No fevers or chills  Relevant historical information: Hypertension   Exam:  BP 130/78 (BP Location: Right Arm, Patient Position: Sitting, Cuff Size: Normal)   Pulse (!) 53   Ht 5\' 6"  (1.676 m)   Wt 177 lb 6.4 oz (80.5 kg)   SpO2 97%   BMI 28.63 kg/m  General: Well Developed, well nourished, and in no acute distress.   MSK: L-spine normal.  Nontender midline.  Decreased lumbar motion. Lower extremity strength is intact. Reflexes are extremities are intact. Mild antalgic gait.      Assessment and Plan: 80 y.o. male with hip pain bilaterally improving with physical therapy.  Lumbar pain new acute low back pain following physical therapy 2 days ago.  Likely represents lumbosacral spasm and dysfunction.  Plan for tizanidine and physical therapy.  New order for PT placed today.  Recheck in 1 month if not improving.  Likely this will resolve spontaneously on its own and with physical therapy.  Limited tizanidine at bedtime and sparing use.   PDMP not reviewed this encounter. Orders Placed This  Encounter  Procedures  . Ambulatory referral to Physical Therapy    Referral Priority:   Routine    Referral Type:   Physical Medicine    Referral Reason:   Specialty Services Required    Requested Specialty:   Physical Therapy   Meds ordered this encounter  Medications  . tiZANidine (ZANAFLEX) 4 MG tablet    Sig: Take 1 tablet (4 mg total) by mouth every 8 (eight) hours as needed for muscle spasms.    Dispense:  30 tablet    Refill:  1     Discussed warning signs or symptoms. Please see discharge instructions. Patient expresses understanding.   The above documentation has been reviewed and is accurate and complete Lynne Leader, M.D.

## 2021-01-24 ENCOUNTER — Ambulatory Visit: Payer: PPO | Admitting: Family Medicine

## 2021-01-24 ENCOUNTER — Other Ambulatory Visit: Payer: Self-pay

## 2021-01-24 ENCOUNTER — Encounter: Payer: Self-pay | Admitting: Family Medicine

## 2021-01-24 ENCOUNTER — Ambulatory Visit: Payer: PPO | Admitting: Physical Therapy

## 2021-01-24 VITALS — BP 130/78 | HR 53 | Ht 66.0 in | Wt 177.4 lb

## 2021-01-24 DIAGNOSIS — M7061 Trochanteric bursitis, right hip: Secondary | ICD-10-CM | POA: Diagnosis not present

## 2021-01-24 DIAGNOSIS — M7062 Trochanteric bursitis, left hip: Secondary | ICD-10-CM | POA: Diagnosis not present

## 2021-01-24 DIAGNOSIS — S39012A Strain of muscle, fascia and tendon of lower back, initial encounter: Secondary | ICD-10-CM | POA: Diagnosis not present

## 2021-01-24 MED ORDER — TIZANIDINE HCL 4 MG PO TABS: 4.0000 mg | ORAL_TABLET | Freq: Three times a day (TID) | ORAL | 1 refills | Status: DC | PRN

## 2021-01-24 NOTE — Patient Instructions (Addendum)
Thank you for coming in today.  I've referred you to Physical Therapy.  Let us know if you don't hear from them in one week.  TENS UNIT: This is helpful for muscle pain and spasm.   Search and Purchase a TENS 7000 2nd edition at  www.tenspros.com or www.[email or web address].com It should be less than $30.     TENS unit instructions: Do not shower or bathe with the unit on . Turn the unit off before removing electrodes or batteries . If the electrodes lose stickiness add a drop of water to the electrodes after they are disconnected from the unit and place on plastic sheet. If you continued to have difficulty, call the TENS unit company to purchase more electrodes. . Do not apply lotion on the skin area prior to use. Make sure the skin is clean and dry as this will help prolong the life of the electrodes. . After use, always check skin for unusual red areas, rash or other skin difficulties. If there are any skin problems, does not apply electrodes to the same area. . Never remove the electrodes from the unit by pulling the wires. . Do not use the TENS unit or electrodes other than as directed. . Do not change electrode placement without consultating your therapist or physician. Marland Kitchen Keep 2 fingers with between each electrode. . Wear time ratio is 2:1, on to off times.    For example on for 30 minutes off for 15 minutes and then on for 30 minutes off for 15 minutes    Use the muscle relaxer as needed mostly as bedtime.   Ok to use the tylenol.   Recheck if not improving.    Lumbosacral Strain Lumbosacral strain is an injury that causes pain in the lower back (lumbosacral spine). This injury usually happens from overstretching the muscles or ligaments along your spine. Ligaments are cord-like tissues that connect bones to other bones. A strain can affect one or more muscles or ligaments. What are the causes? This condition may be caused by: A hard, direct hit to the back. Overstretching the lower  back muscles. This may result from: A fall. Lifting something heavy. Repetitive movements such as bending or crouching. What increases the risk? The following factors may make you more likely to develop this condition: Participating in sports or activities that involve: A sudden twist of the back. Pushing or pulling motions. Being overweight or obese. Having poor strength and flexibility, especially tight hamstrings or weak muscles in the back or abdomen. Having too much of a curve in the lower back. Having a pelvis that is tilted forward. What are the signs or symptoms? The main symptom of this condition is pain in the lower back, at the site of the strain. Pain may also be felt down one or both legs. How is this diagnosed? This condition is diagnosed based on your symptoms, your medical history, and a physical exam. During the physical exam, your health care provider may push on certain areas of your back to find the source of your pain. You may be asked to bend forward, backward, and side to side to check your pain and range of motion. You may also have imaging tests, such as X-rays and an MRI. How is this treated? This condition may be treated by: Applying heat and cold on the affected area. Taking medicines to help relieve pain and relax your muscles. Taking NSAIDs, such as ibuprofen, to help reduce swelling and discomfort. Doing stretching and strengthening exercises  for your lower back. Symptoms usually improve within several weeks of treatment. However, recovery time varies. When your symptoms improve, gradually return to your normal routine as soon as possible to reduce pain, avoid stiffness, and keep muscle strength. Follow these instructions at home: Medicines Take over-the-counter and prescription medicines only as told by your health care provider. Ask your health care provider if the medicine prescribed to you: Requires you to avoid driving or using heavy machinery. Can  cause constipation. You may need to take these actions to prevent or treat constipation: Drink enough fluid to keep your urine pale yellow. Take over-the-counter or prescription medicines. Eat foods that are high in fiber, such as beans, whole grains, and fresh fruits and vegetables. Limit foods that are high in fat and processed sugars, such as fried or sweet foods. Managing pain, stiffness, and swelling If directed, put ice on the injured area. To do this: Put ice in a plastic bag. Place a towel between your skin and the bag. Leave the ice on for 20 minutes, 2-3 times a day. If directed, apply heat on the affected area as often as told by your health care provider. Use the heat source that your health care provider recommends, such as a moist heat pack or a heating pad. Place a towel between your skin and the heat source. Leave the heat on for 20-30 minutes. Remove the heat if your skin turns bright red. This is especially important if you are unable to feel pain, heat, or cold. You may have a greater risk of getting burned.      Activity Rest as told by your health care provider. Do not stay in bed. Staying in bed for more than 1-2 days can delay your recovery. Return to your normal activities as told by your health care provider. Ask your health care provider what activities are safe for you. Avoid activities that take a lot of energy for as long as told by your health care provider. Do exercises as told by your health care provider. This includes stretching and strengthening exercises. General instructions Sit up and stand up straight. Avoid leaning forward when you sit, or hunching over when you stand. Do not use any products that contain nicotine or tobacco, such as cigarettes, e-cigarettes, and chewing tobacco. If you need help quitting, ask your health care provider. Keep all follow-up visits as told by your health care provider. This is important. How is this prevented? Use  correct form when playing sports and lifting heavy objects. Use good posture when sitting and standing. Maintain a healthy weight. Sleep on a mattress with medium firmness to support your back. Do at least 150 minutes of moderate-intensity exercise each week, such as brisk walking or water aerobics. Try a form of exercise that takes stress off your back, such as swimming or stationary cycling. Maintain physical fitness, including: Strength. Flexibility.   Contact a health care provider if: Your back pain does not improve after several weeks of treatment. Your symptoms get worse. Get help right away if: Your back pain is severe. You cannot stand or walk. You have difficulty controlling when you urinate or when you have a bowel movement. You feel nauseous or you vomit. Your feet or legs get very cold, turn pale, or look blue. You have numbness, tingling, weakness, or problems using your arms or legs. You develop any of the following: Shortness of breath. Dizziness. Pain in your legs. Weakness in your buttocks or legs. Summary Lumbosacral strain is  an injury that causes pain in the lower back (lumbosacral spine). This injury usually happens from overstretching the muscles or ligaments along your spine. This condition may be caused by a direct hit to the lower back or by overstretching the lower back muscles. Symptoms usually improve within several weeks of treatment. This information is not intended to replace advice given to you by your health care provider. Make sure you discuss any questions you have with your health care provider. Document Revised: 01/04/2019 Document Reviewed: 01/04/2019 Elsevier Patient Education  [place].

## 2021-01-29 ENCOUNTER — Encounter: Payer: Self-pay | Admitting: Rehabilitative and Restorative Service Providers"

## 2021-01-29 ENCOUNTER — Ambulatory Visit: Payer: PPO | Attending: Family Medicine | Admitting: Rehabilitative and Restorative Service Providers"

## 2021-01-29 ENCOUNTER — Other Ambulatory Visit: Payer: Self-pay

## 2021-01-29 DIAGNOSIS — G8929 Other chronic pain: Secondary | ICD-10-CM | POA: Diagnosis not present

## 2021-01-29 DIAGNOSIS — R262 Difficulty in walking, not elsewhere classified: Secondary | ICD-10-CM | POA: Diagnosis not present

## 2021-01-29 DIAGNOSIS — M25562 Pain in left knee: Secondary | ICD-10-CM | POA: Diagnosis not present

## 2021-01-29 DIAGNOSIS — M25551 Pain in right hip: Secondary | ICD-10-CM | POA: Diagnosis not present

## 2021-01-29 DIAGNOSIS — M25552 Pain in left hip: Secondary | ICD-10-CM | POA: Insufficient documentation

## 2021-01-29 DIAGNOSIS — R252 Cramp and spasm: Secondary | ICD-10-CM

## 2021-01-29 DIAGNOSIS — M545 Low back pain, unspecified: Secondary | ICD-10-CM | POA: Insufficient documentation

## 2021-01-29 NOTE — Therapy (Signed)
[place]. [place], [place], [address] Phone: [phone]   Fax:  [phone]  Physical Therapy Re-Evaluation  Patient Details  Name: [name] MRN: [id number] Date of Birth: 1941/04/11 Referring Provider (PT): Dr Georgina Snell   Encounter Date: 01/29/2021   PT End of Session - 01/29/21 1347    Visit Number 5    Date for PT Re-Evaluation 03/22/21    PT Start Time 5974    PT Stop Time 1055    PT Time Calculation (min) 40 min    Activity Tolerance Patient tolerated treatment well    Behavior During Therapy [facility] for tasks assessed/performed           Past Medical History:  Diagnosis Date  . Anemia   . Barrett's esophagus   . Blood transfusion without reported diagnosis   . CAD (coronary artery disease)    2002 stenting of the RCA,, 2006 Stenting of diag with DES, PTCA of Circ, 50% LAD  . Cancer ([place])    skin cancer scalp and forehead  . 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 Diverticulosis   . DJD (degenerative joint disease)   . Duodenal ulcer   . ED (erectile dysfunction)   . GERD (gastroesophageal reflux disease)    increased s/s in the last week 03-22-19 PV   . Hemorrhoids   . Hyperlipidemia   . Hypertension   . Myocardial infarction ([place])    2002  . 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   mid frontal scalp-cx19f  . SCC (squamous cell carcinoma) 06/15/2019   front middle scalp-tx cx342f . SCC (squamous cell carcinoma) 12/27/2002   CIS-right angle of jaw--cx3569f. SCC (squamous cell carcinoma) 09/24/2004   CIS--mid post crown, left mis crown medial,left first web space-tx cx35f45f SCC (squamous cell carcinoma) 06/03/2006   SCCA-right temple-tx cx35fu85fSCC (squamous cell carcinoma) 06/03/2006   CIS-right forehead, left mis crown scalp medial-tx cx35fu 87fCC (squamous cell carcinoma) 07/30/2006   right temple lateral-txpbx  . SCC  (squamous cell carcinoma) 11/10/2006   left forehead, left hand proximal  . 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/02/2009   left scalp-cx35fu  51fC (squamous cell carcinoma) 01/24/2015   left scalp-cx35fu  .31f (squamous cell carcinoma) 11/12/2015   front lateralscalp-cx35fu, me51f scalp-cx35fu  . S106fsquamous cell carcinoma) 12/17/2016   front center scalp-cx35fu  . Sq66fus cell carcinoma of skin 08/10/2017   CIS--right ear rim, top of scalp, left side scalp-tx-cx35fu    Pas40frgical History:  Procedure Laterality Date  . COLONOSCOPY    . ELBOW ARTHROSCOPY    . heart stents     2002,2004  . SPINAL FUSION    . SPINAL FUSION    . SPINE SURGERY     cervical  . UPPER GASTROINTESTINAL ENDOSCOPY      There were no vitals filed for this visit.    Subjective Assessment - 01/29/21 1021    Subjective Patient reports that he left last PT session and started to have back pain.  He reports that he had to cancel his second PT session for the week due to the back pain and went to MD for a follow up and reported his back pain.  He states that his pain was 7-8/10 and today is down to a 3-4/10.    Pertinent History CAD, CKD, HTN  Limitations Standing;Walking    Patient Stated Goals reduce pain    Currently in Pain? Yes    Pain Score 3     Pain Location Hip    Pain Orientation Right    Pain Descriptors / Indicators Aching    Pain Score 3    Pain Location Back    Pain Orientation Lower    Pain Descriptors / Indicators Aching    Pain Type Acute pain    Pain Onset In the past 7 days    Pain Frequency Intermittent              [facility] PT Assessment - 01/29/21 0001      Assessment   Medical Diagnosis Back Pain    Referring Provider (PT) Dr Georgina Snell    Onset Date/Surgical Date 01/22/21    Next MD Visit 03/14/21 with Dr Ronnald Ramp      Precautions   Precautions None      Restrictions   Weight Bearing Restrictions No      Balance Screen   Has the patient fallen in the past 6 months  No    Has the patient had a decrease in activity level because of a fear of falling?  No    Is the patient reluctant to leave their home because of a fear of falling?  No      Home Environment   Additional Comments reports no stairs at home; does state he is having trouble with stairs      Prior Function   Level of Independence Independent    Vocation Retired    Leisure walking, goes to gym in [employer] Appears Intact      ROM / Strength   AROM / PROM / Strength AROM;Strength      AROM   Overall AROM Comments 25% decrease in lumbar ROM      Strength   Overall Strength Comments B hip strength 4-/5, L knee ext/flex 4-/5. R lmee strength of 4/5      Flexibility   Soft Tissue Assessment /Muscle Length yes    Hamstrings very tight L>R    ITB tight    Piriformis tight      Palpation   Palpation comment tender to palpation R greater troch/glute med      Transfers   Five time sit to stand comments  9.65 sec                      Objective measurements completed on examination: See above findings.       [place] Adult PT Treatment/Exercise - 01/29/21 0001      Knee/Hip Exercises: Aerobic   Recumbent Bike L 2.0, x6 min      Knee/Hip Exercises: Standing   Hip Flexion Stengthening;Both;1 set;10 reps    Hip Abduction Stengthening;Both;1 set;10 reps    Hip Extension Stengthening;Both;1 set;10 reps      Knee/Hip Exercises: Supine   Bridges Both;1 set;10 reps    Bridges with [employer] Strengthening;1 set;10 reps                    PT Short Term Goals - 01/10/21 1316      PT SHORT TERM GOAL #1   Title Pt will be I with initial HEP    Status Achieved             PT Long Term Goals - 01/29/21 1353  PT LONG TERM GOAL #1   Title Pt will be I with advanced HEP    Time 8    Period Weeks    Status Partially Met    Target Date 03/22/21      PT LONG TERM GOAL #2   Title Pt will demo able to ascend/descend  stairs with no increase in pain.    Time 8    Period Weeks    Status On-going    Target Date 03/22/21      PT LONG TERM GOAL #3   Title Pt will report 50% reduction in L knee pain    Status Achieved      PT LONG TERM GOAL #4   Title Pt will demo B hip ext/abd MMT >/= 4+/5 with no increase in hip pain    Time 8    Period Weeks    Status On-going    Target Date 03/22/21      PT LONG TERM GOAL #5   Title Patient will report a decrease in back pain and an improvement in back pain of at least 75%.    Time 8    Period Weeks    Status New                  Plan - 01/29/21 1348    Clinical Impression Statement Pt presents to PT with a new referal for back spasms/pain and pt was re-evaluated today to assess his back pain.  He presents with increased back pain, decreased ROM, and weakness in addition to previous hip pain and weakness.  He continues to have increased pain in R hip compared to L secondary to his recent bursitis.  He would benefit from additional outpatient PT to progress towards goal related activities.    Personal Factors and Comorbidities Age    Examination-Activity Limitations Stairs;Stand;Locomotion Level    Examination-Participation Restrictions Community Activity;Interpersonal Relationship    Stability/Clinical Decision Making Evolving/Moderate complexity    Clinical Decision Making Moderate    Rehab Potential Good    PT Frequency 2x / week    PT Duration 8 weeks    PT Treatment/Interventions ADLs/Self Care Home Management;Electrical Stimulation;Iontophoresis 40m/ml Dexamethasone;Moist Heat;Gait training;Stair training;Therapeutic activities;Therapeutic exercise;Balance training;Neuromuscular re-education;Patient/family education;Manual techniques;Passive range of motion;Dry needling;Taping;Spinal Manipulations;Joint Manipulations;Traction    PT Next Visit Plan strengthening, balance, core stability    Consulted and Agree with Plan of Care Patient            Patient will benefit from skilled therapeutic intervention in order to improve the following deficits and impairments:  Abnormal gait,Difficulty walking,Increased muscle spasms,Decreased activity tolerance,Pain,Decreased balance,Hypomobility,Decreased strength,Impaired flexibility,Postural dysfunction  Visit Diagnosis: Acute bilateral low back pain without sciatica - Plan: PT plan of care cert/re-cert  Pain in right hip - Plan: PT plan of care cert/re-cert  Pain in left hip - Plan: PT plan of care cert/re-cert  Cramp and spasm - Plan: PT plan of care cert/re-cert  Difficulty in walking, not elsewhere classified - Plan: PT plan of care cert/re-cert     Problem List Patient Active Problem List   Diagnosis Date Noted  . Bilateral hip pain 12/05/2020  . Foot swelling 10/05/2020  . Drug-induced hyperkalemia 03/12/2020  . Educated about COVID-19 virus infection 10/19/2019  . Coarse tremors 06/10/2016  . Routine general medical examination at a health care facility 02/07/2016  . CAD (coronary artery disease) 05/01/2015  . CKD (chronic kidney disease), stage III ([place] 05/01/2015  . 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  BARRETTS ESOPHAGUS 01/24/2010  . 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 Hyperlipidemia with target LDL less than 70 09/21/2007  . Chronic ischemic heart disease 09/21/2007  . ERECTILE DYSFUNCTION 03/09/2007  . Essential hypertension 03/09/2007    Juel Burrow, PT, DPT 01/29/2021, 2:01 PM  [facility] [place]. [place], [place], [address] Phone: [phone]   Fax:  [phone]  Name: [name] MRN: [id number] Date of Birth: November 10, 1940

## 2021-01-31 ENCOUNTER — Ambulatory Visit: Payer: PPO | Admitting: Rehabilitative and Restorative Service Providers"

## 2021-01-31 ENCOUNTER — Encounter: Payer: Self-pay | Admitting: Rehabilitative and Restorative Service Providers"

## 2021-01-31 ENCOUNTER — Other Ambulatory Visit: Payer: Self-pay

## 2021-01-31 DIAGNOSIS — M25552 Pain in left hip: Secondary | ICD-10-CM

## 2021-01-31 DIAGNOSIS — M25551 Pain in right hip: Secondary | ICD-10-CM

## 2021-01-31 DIAGNOSIS — H40013 Open angle with borderline findings, low risk, bilateral: Secondary | ICD-10-CM | POA: Diagnosis not present

## 2021-01-31 DIAGNOSIS — M545 Low back pain, unspecified: Secondary | ICD-10-CM

## 2021-01-31 DIAGNOSIS — R262 Difficulty in walking, not elsewhere classified: Secondary | ICD-10-CM

## 2021-01-31 DIAGNOSIS — M25562 Pain in left knee: Secondary | ICD-10-CM

## 2021-01-31 DIAGNOSIS — R252 Cramp and spasm: Secondary | ICD-10-CM

## 2021-01-31 NOTE — Therapy (Signed)
[place]. [place], [place], [address] Phone: [phone]   Fax:  [phone]  Physical Therapy Treatment  Patient Details  Name: [name] MRN: [id number] Date of Birth: 1940-11-08 Referring Provider (PT): Dr Georgina Snell   Encounter Date: 01/31/2021   PT End of Session - 01/31/21 1029     Visit Number 6    Date for PT Re-Evaluation 03/22/21    PT Start Time 1015    PT Stop Time 1057    PT Time Calculation (min) 42 min    Activity Tolerance Patient tolerated treatment well    Behavior During Therapy [facility] for tasks assessed/performed             Past Medical History:  Diagnosis Date   Anemia    Barrett's esophagus    Blood transfusion without reported diagnosis    CAD (coronary artery disease)    2002 stenting of the RCA,, 2006 Stenting of diag with DES, PTCA of Circ, 50% LAD   Cancer ([place])    skin cancer scalp and forehead   Chronic kidney disease    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 - decreased dose of HCTZ to M,F   Diverticulosis    DJD (degenerative joint disease)    Duodenal ulcer    ED (erectile dysfunction)    GERD (gastroesophageal reflux disease)    increased s/s in the last week 03-22-19 PV    Hemorrhoids    Hyperlipidemia    Hypertension    Myocardial infarction ([place])    2002   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   mid frontal scalp-cx7f   SCC (squamous cell carcinoma) 06/15/2019   front middle scalp-tx cx315f  SCC (squamous cell carcinoma) 12/27/2002   CIS-right angle of jaw--cx3545f SCC (squamous cell carcinoma) 09/24/2004   CIS--mid post crown, left mis crown medial,left first web space-tx cx35f13fSCC (squamous cell carcinoma) 06/03/2006   SCCA-right temple-tx cx35fu66fCC (squamous cell carcinoma) 06/03/2006   CIS-right forehead, left mis crown scalp medial-tx cx35fu 19fC (squamous cell carcinoma) 07/30/2006   right temple lateral-txpbx   SCC (squamous cell carcinoma)  11/10/2006   left forehead, left hand proximal   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/02/2009   left scalp-cx35fu  46f (squamous cell carcinoma) 01/24/2015   left scalp-cx35fu   81f(squamous cell carcinoma) 11/12/2015   front lateralscalp-cx35fu, me44f scalp-cx35fu   SC62fquamous cell carcinoma) 12/17/2016   front center scalp-cx35fu   Squ48fs cell carcinoma of skin 08/10/2017   CIS--right ear rim, top of scalp, left side scalp-tx-cx35fu    Pas59frgical History:  Procedure Laterality Date   COLONOSCOPY     ELBOW ARTHROSCOPY     heart stents     2002,2004   SPINAL FUSION     SPINAL FUSION     SPINE SURGERY     cervical   UPPER GASTROINTESTINAL ENDOSCOPY      There were no vitals filed for this visit.   Subjective Assessment - 01/31/21 1028     Subjective Patient reports that his back is starting to feel better, but he is still having the same hip pain as he did previously.    Pertinent History CAD, CKD, HTN    Patient Stated Goals reduce pain    Currently in Pain? Yes    Pain Score 3     Pain Location Hip    Pain Orientation Right  Pain Descriptors / Indicators Aching;Constant    Pain Score 2    Pain Location Back    Pain Orientation Lower    Pain Descriptors / Indicators Aching                               [facility] Adult PT Treatment/Exercise - 01/31/21 0001       Ambulation/Gait   Ambulation/Gait Yes    Ambulation Distance (Feet) 800 Feet    Gait Pattern Step-through pattern;Decreased stance time - right;Antalgic    Ambulation Surface Unlevel;Outdoor;Paved      Knee/Hip Exercises: Aerobic   Recumbent Bike L 2.0, x6 min      Knee/Hip Exercises: Machines for Strengthening   Cybex Knee Extension 20# 2x10    Cybex Knee Flexion 25# 2x10    Cybex Leg Press 30# 2x12      Knee/Hip Exercises: Standing   Hip Flexion Stengthening;Both;1 set;10 reps    Hip Flexion Limitations 2# each ankle    Hip Abduction Stengthening;Both;1 set;10 reps     Abduction Limitations 2#    Hip Extension Stengthening;Both;1 set;10 reps    Extension Limitations 2#    Walking with Sports Cord 30# 4 way x3 each      Knee/Hip Exercises: Seated   Hamstring Curl Both;2 sets;10 reps    Hamstring Limitations green theraband    Sit to Sand 10 reps;without UE support                      PT Short Term Goals - 01/10/21 1316       PT SHORT TERM GOAL #1   Title Pt will be I with initial HEP    Status Achieved               PT Long Term Goals - 01/29/21 1353       PT LONG TERM GOAL #1   Title Pt will be I with advanced HEP    Time 8    Period Weeks    Status Partially Met    Target Date 03/22/21      PT LONG TERM GOAL #2   Title Pt will demo able to ascend/descend stairs with no increase in pain.    Time 8    Period Weeks    Status On-going    Target Date 03/22/21      PT LONG TERM GOAL #3   Title Pt will report 50% reduction in L knee pain    Status Achieved      PT LONG TERM GOAL #4   Title Pt will demo B hip ext/abd MMT >/= 4+/5 with no increase in hip pain    Time 8    Period Weeks    Status On-going    Target Date 03/22/21      PT LONG TERM GOAL #5   Title Patient will report a decrease in back pain and an improvement in back pain of at least 75%.    Time 8    Period Weeks    Status New                   Plan - 01/31/21 1104     Clinical Impression Statement Patient with decreased back pain today and able to complete previous exercises today without increased pain.  He reports that his R hip hurts more than his back at this time.  He was able  to ambulate increased distance today outside without loss of balance over various surfaces.  He continues to require skilled PT to progress towards goal related activites.    PT Treatment/Interventions ADLs/Self Care Home Management;Electrical Stimulation;Iontophoresis 10m/ml Dexamethasone;Moist Heat;Gait training;Stair training;Therapeutic  activities;Therapeutic exercise;Balance training;Neuromuscular re-education;Patient/family education;Manual techniques;Passive range of motion;Dry needling;Taping;Spinal Manipulations;Joint Manipulations;Traction             Patient will benefit from skilled therapeutic intervention in order to improve the following deficits and impairments:  Abnormal gait, Difficulty walking, Increased muscle spasms, Decreased activity tolerance, Pain, Decreased balance, Hypomobility, Decreased strength, Impaired flexibility, Postural dysfunction  Visit Diagnosis: Acute bilateral low back pain without sciatica  Pain in right hip  Pain in left hip  Cramp and spasm  Difficulty in walking, not elsewhere classified  Chronic pain of left knee     Problem List Patient Active Problem List   Diagnosis Date Noted   Bilateral hip pain 12/05/2020   Foot swelling 10/05/2020   Drug-induced hyperkalemia 03/12/2020   Educated about COVID-19 virus infection 10/19/2019   Coarse tremors 06/10/2016   Routine general medical examination at a health care facility 02/07/2016   CAD (coronary artery disease) 05/01/2015   CKD (chronic kidney disease), stage III ([place] 05/01/2015   BPH with obstruction/lower urinary tract symptoms 11/13/2010   BARRETTS ESOPHAGUS 01/24/2010   OSTEOARTHRITIS, KNEES, BILATERAL 10/01/2009   Hyperlipidemia with target LDL less than 70 09/21/2007   Chronic ischemic heart disease 09/21/2007   ERECTILE DYSFUNCTION 03/09/2007   Essential hypertension 03/09/2007    SJuel Burrow PT, DPT 01/31/2021, 11:12 AM  [place] [address]Phone: [phone]  Fax:  [phone] Name: JZACKARIE CHASONMRN: [id number] of Birth: 1July 30, 1942

## 2021-02-06 ENCOUNTER — Ambulatory Visit: Payer: PPO | Admitting: Rehabilitative and Restorative Service Providers"

## 2021-02-06 ENCOUNTER — Other Ambulatory Visit: Payer: Self-pay

## 2021-02-06 ENCOUNTER — Encounter: Payer: Self-pay | Admitting: Rehabilitative and Restorative Service Providers"

## 2021-02-06 DIAGNOSIS — R262 Difficulty in walking, not elsewhere classified: Secondary | ICD-10-CM

## 2021-02-06 DIAGNOSIS — R252 Cramp and spasm: Secondary | ICD-10-CM

## 2021-02-06 DIAGNOSIS — M545 Low back pain, unspecified: Secondary | ICD-10-CM

## 2021-02-06 DIAGNOSIS — M25552 Pain in left hip: Secondary | ICD-10-CM

## 2021-02-06 DIAGNOSIS — M25551 Pain in right hip: Secondary | ICD-10-CM

## 2021-02-06 DIAGNOSIS — M25562 Pain in left knee: Secondary | ICD-10-CM

## 2021-02-06 NOTE — Therapy (Signed)
[place]. [place], [place], [address] Phone: [phone]   Fax:  [phone]  Physical Therapy Treatment  Patient Details  Name: [name] MRN: [id number] Date of Birth: [birth year] Referring Provider (PT): Dr Georgina Snell   Encounter Date: 02/06/2021   PT End of Session - 02/06/21 0931     Visit Number 7    Date for PT Re-Evaluation 03/22/21    PT Start Time 0928    PT Stop Time 1010    PT Time Calculation (min) 42 min    Activity Tolerance Patient tolerated treatment well    Behavior During Therapy [facility] for tasks assessed/performed             Past Medical History:  Diagnosis Date   Anemia    Barrett's esophagus    Blood transfusion without reported diagnosis    CAD (coronary artery disease)    2002 stenting of the RCA,, 2006 Stenting of diag with DES, PTCA of Circ, 50% LAD   Cancer ([place])    skin cancer scalp and forehead   Chronic kidney disease    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 - decreased dose of HCTZ to M,F   Diverticulosis    DJD (degenerative joint disease)    Duodenal ulcer    ED (erectile dysfunction)    GERD (gastroesophageal reflux disease)    increased s/s in the last week 03-22-19 PV    Hemorrhoids    Hyperlipidemia    Hypertension    Myocardial infarction ([place])    2002   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   mid frontal scalp-cx75f   SCC (squamous cell carcinoma) 06/15/2019   front middle scalp-tx cx325f  SCC (squamous cell carcinoma) 12/27/2002   CIS-right angle of jaw--cx3543f SCC (squamous cell carcinoma) 09/24/2004   CIS--mid post crown, left mis crown medial,left first web space-tx cx35f19fSCC (squamous cell carcinoma) 06/03/2006   SCCA-right temple-tx cx35fu47fCC (squamous cell carcinoma) 06/03/2006   CIS-right forehead, left mis crown scalp medial-tx cx35fu 1fC (squamous cell carcinoma) 07/30/2006   right temple lateral-txpbx   SCC (squamous cell carcinoma)  11/10/2006   left forehead, left hand proximal   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/02/2009   left scalp-cx35fu  63f (squamous cell carcinoma) 01/24/2015   left scalp-cx35fu   71f(squamous cell carcinoma) 11/12/2015   front lateralscalp-cx35fu, me38f scalp-cx35fu   SC38fquamous cell carcinoma) 12/17/2016   front center scalp-cx35fu   Squ12fs cell carcinoma of skin 08/10/2017   CIS--right ear rim, top of scalp, left side scalp-tx-cx35fu    Pas50frgical History:  Procedure Laterality Date   COLONOSCOPY     ELBOW ARTHROSCOPY     heart stents     2002,2004   SPINAL FUSION     SPINAL FUSION     SPINE SURGERY     cervical   UPPER GASTROINTESTINAL ENDOSCOPY      There were no vitals filed for this visit.   Subjective Assessment - 02/06/21 0929     Subjective I am doing okay    Pertinent History CAD, CKD, HTN    Currently in Pain? Yes    Pain Score 4     Pain Location Hip    Pain Orientation Right    Pain Descriptors / Indicators Aching;Constant    Pain Score 3    Pain Location Back    Pain Orientation Lower    Pain  Descriptors / Indicators Aching                               [facility] Adult PT Treatment/Exercise - 02/06/21 0001       Knee/Hip Exercises: Aerobic   Recumbent Bike L 2.0, x6 min      Knee/Hip Exercises: Machines for Strengthening   Cybex Knee Extension 20# 2x10    Cybex Knee Flexion 25# 2x10    Cybex Leg Press 30# 2x12    Hip Cybex 3 way hip 2x10 5#      Knee/Hip Exercises: Standing   Lateral Step Up Both;10 reps;Hand Hold: 2;Step Height: 6"   alternate LE abduction   Forward Step Up Both;10 reps;Hand Hold: 2;Step Height: 6"    Forward Step Up Limitations alt LE extension      Manual Therapy   Manual Therapy Soft tissue mobilization    Soft tissue mobilization STM to lumbar paraspinal region and areas of spasm.                      PT Short Term Goals - 01/10/21 1316       PT SHORT TERM GOAL #1   Title Pt  will be I with initial HEP    Status Achieved               PT Long Term Goals - 02/06/21 1011       PT LONG TERM GOAL #1   Title Pt will be I with advanced HEP    Status Partially Met      PT LONG TERM GOAL #2   Title Pt will demo able to ascend/descend stairs with no increase in pain.    Status On-going      PT LONG TERM GOAL #3   Title Pt will report 50% reduction in L knee pain    Status Achieved      PT LONG TERM GOAL #4   Title Pt will demo B hip ext/abd MMT >/= 4+/5 with no increase in hip pain    Status On-going      PT LONG TERM GOAL #5   Title Patient will report a decrease in back pain and an improvement in back pain of at least 75%.    Status On-going                   Plan - 02/06/21 1006     Clinical Impression Statement Patient was able to progress with increased strengthening activities today.  He had some discomfort with 3 way hip on Power Tower and required cuing for posture, but alleviated with stopping exercise.  He has improved balance with sit to/from stand on airex and does not require use of UE.  He continues to require skilled PT to progress towards goal related activities.    PT Treatment/Interventions ADLs/Self Care Home Management;Electrical Stimulation;Iontophoresis 50m/ml Dexamethasone;Moist Heat;Gait training;Stair training;Therapeutic activities;Therapeutic exercise;Balance training;Neuromuscular re-education;Patient/family education;Manual techniques;Passive range of motion;Dry needling;Taping;Spinal Manipulations;Joint Manipulations;Traction    PT Next Visit Plan strengthening, balance, core stability    Consulted and Agree with Plan of Care Patient             Patient will benefit from skilled therapeutic intervention in order to improve the following deficits and impairments:  Abnormal gait, Difficulty walking, Increased muscle spasms, Decreased activity tolerance, Pain, Decreased balance, Hypomobility, Decreased strength,  Impaired flexibility, Postural dysfunction  Visit Diagnosis: Acute bilateral  low back pain without sciatica  Pain in right hip  Pain in left hip  Cramp and spasm  Difficulty in walking, not elsewhere classified  Chronic pain of left knee     Problem List Patient Active Problem List   Diagnosis Date Noted   Bilateral hip pain 12/05/2020   Foot swelling 10/05/2020   Drug-induced hyperkalemia 03/12/2020   Educated about COVID-19 virus infection 10/19/2019   Coarse tremors 06/10/2016   Routine general medical examination at a health care facility 02/07/2016   CAD (coronary artery disease) 05/01/2015   CKD (chronic kidney disease), stage III ([place]) 05/01/2015   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 [place], [place], [address] Phone: [phone]   Fax:  [phone]  Name: [name] MRN: [id number] Date of Birth: [birth year]

## 2021-02-09 ENCOUNTER — Other Ambulatory Visit: Payer: Self-pay | Admitting: Internal Medicine

## 2021-02-09 DIAGNOSIS — I10 Essential (primary) hypertension: Secondary | ICD-10-CM

## 2021-02-09 DIAGNOSIS — K219 Gastro-esophageal reflux disease without esophagitis: Secondary | ICD-10-CM

## 2021-02-09 DIAGNOSIS — I2510 Atherosclerotic heart disease of native coronary artery without angina pectoris: Secondary | ICD-10-CM

## 2021-02-09 DIAGNOSIS — K2270 Barrett's esophagus without dysplasia: Secondary | ICD-10-CM

## 2021-02-11 ENCOUNTER — Other Ambulatory Visit: Payer: Self-pay

## 2021-02-11 ENCOUNTER — Encounter: Payer: Self-pay | Admitting: Rehabilitative and Restorative Service Providers"

## 2021-02-11 ENCOUNTER — Ambulatory Visit: Payer: PPO | Admitting: Rehabilitative and Restorative Service Providers"

## 2021-02-11 DIAGNOSIS — M25551 Pain in right hip: Secondary | ICD-10-CM

## 2021-02-11 DIAGNOSIS — G8929 Other chronic pain: Secondary | ICD-10-CM

## 2021-02-11 DIAGNOSIS — M545 Low back pain, unspecified: Secondary | ICD-10-CM

## 2021-02-11 DIAGNOSIS — M25552 Pain in left hip: Secondary | ICD-10-CM

## 2021-02-11 DIAGNOSIS — R252 Cramp and spasm: Secondary | ICD-10-CM

## 2021-02-11 DIAGNOSIS — R262 Difficulty in walking, not elsewhere classified: Secondary | ICD-10-CM

## 2021-02-11 NOTE — Therapy (Signed)
[place]. [place], [place], [address] Phone: [phone]   Fax:  [phone]  Physical Therapy Treatment  Patient Details  Name: [name] MRN: [id number] Date of Birth: 30-Aug-1940 Referring Provider (PT): Dr Georgina Snell   Encounter Date: 02/11/2021   PT End of Session - 02/11/21 1144     Visit Number 8    Date for PT Re-Evaluation 03/22/21    PT Start Time 61    PT Stop Time 1144    PT Time Calculation (min) 44 min    Activity Tolerance Patient tolerated treatment well    Behavior During Therapy [facility] for tasks assessed/performed             Past Medical History:  Diagnosis Date   Anemia    Barrett's esophagus    Blood transfusion without reported diagnosis    CAD (coronary artery disease)    2002 stenting of the RCA,, 2006 Stenting of diag with DES, PTCA of Circ, 50% LAD   Cancer ([place])    skin cancer scalp and forehead   Chronic kidney disease    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 - decreased dose of HCTZ to M,F   Diverticulosis    DJD (degenerative joint disease)    Duodenal ulcer    ED (erectile dysfunction)    GERD (gastroesophageal reflux disease)    increased s/s in the last week 03-22-19 PV    Hemorrhoids    Hyperlipidemia    Hypertension    Myocardial infarction ([place])    2002   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   mid frontal scalp-cx62f   SCC (squamous cell carcinoma) 06/15/2019   front middle scalp-tx cx352f  SCC (squamous cell carcinoma) 12/27/2002   CIS-right angle of jaw--cx3578f SCC (squamous cell carcinoma) 09/24/2004   CIS--mid post crown, left mis crown medial,left first web space-tx cx35f72fSCC (squamous cell carcinoma) 06/03/2006   SCCA-right temple-tx cx35fu2fCC (squamous cell carcinoma) 06/03/2006   CIS-right forehead, left mis crown scalp medial-tx cx35fu 3fC (squamous cell carcinoma) 07/30/2006   right temple lateral-txpbx   SCC (squamous cell carcinoma)  11/10/2006   left forehead, left hand proximal   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/02/2009   left scalp-cx35fu  49f (squamous cell carcinoma) 01/24/2015   left scalp-cx35fu   14f(squamous cell carcinoma) 11/12/2015   front lateralscalp-cx35fu, me63f scalp-cx35fu   SC76fquamous cell carcinoma) 12/17/2016   front center scalp-cx35fu   Squ66fs cell carcinoma of skin 08/10/2017   CIS--right ear rim, top of scalp, left side scalp-tx-cx35fu    Pas46frgical History:  Procedure Laterality Date   COLONOSCOPY     ELBOW ARTHROSCOPY     heart stents     2002,2004   SPINAL FUSION     SPINAL FUSION     SPINE SURGERY     cervical   UPPER GASTROINTESTINAL ENDOSCOPY      There were no vitals filed for this visit.   Subjective Assessment - 02/11/21 1104     Subjective My back is feeling better, but I still have the hip pain.    Patient Stated Goals reduce pain    Currently in Pain? Yes    Pain Score 4     Pain Location Hip    Pain Orientation Right    Pain Descriptors / Indicators Aching;Constant  [place] Adult PT Treatment/Exercise - 02/11/21 0001       Knee/Hip Exercises: Aerobic   Recumbent Bike L2.5 x6 min      Knee/Hip Exercises: Machines for Strengthening   Cybex Knee Extension 20# 2x10    Cybex Knee Flexion 25# 2x10      Knee/Hip Exercises: Standing   Hip Flexion Stengthening;Both;1 set;10 reps    Hip Flexion Limitations 5#    Hip Abduction Stengthening;Both;1 set;10 reps    Abduction Limitations 5#    Hip Extension Stengthening;Both;1 set;10 reps    Extension Limitations 5#    Lateral Step Up Both;10 reps;Hand Hold: 2;Step Height: 6"    Lateral Step Up Limitations alt LE abduction    Forward Step Up Both;10 reps;Hand Hold: 2;Step Height: 6"    Forward Step Up Limitations alt LE extension      Knee/Hip Exercises: Seated   Hamstring Curl Both;2 sets;10 reps    Hamstring Limitations green theraband    Sit to Sand  10 reps   with yellow wt ball x10 with CP, x10 with OHP     Modalities   Modalities Iontophoresis      Iontophoresis   Type of Iontophoresis Dexamethasone    Location R hip/greater trochanter area    Dose 59m/mL    Time 4 hours                      PT Short Term Goals - 01/10/21 1316       PT SHORT TERM GOAL #1   Title Pt will be I with initial HEP    Status Achieved               PT Long Term Goals - 02/11/21 1147       PT LONG TERM GOAL #1   Title Pt will be I with advanced HEP    Status Partially Met      PT LONG TERM GOAL #2   Title Pt will demo able to ascend/descend stairs with no increase in pain.    Status On-going      PT LONG TERM GOAL #3   Title Pt will report 50% reduction in L knee pain    Status Achieved      PT LONG TERM GOAL #4   Title Pt will demo B hip ext/abd MMT >/= 4+/5 with no increase in hip pain    Status On-going      PT LONG TERM GOAL #5   Title Patient will report a decrease in back pain and an improvement in back pain of at least 75%.    Status Partially Met                   Plan - 02/11/21 1145     Clinical Impression Statement Patient continues to progress towards goal related activities.  He is no longer having back pain, but is continuing to have R hip pain.  Pt educated about the risks/benefits of iontophoresis, and he opted to utilize treatment to try to get some relief of his hip pain.  Ionto patch applied to R hip/greater trochanter region.  He continues to progress towards increased strengthening and balance.    PT Treatment/Interventions ADLs/Self Care Home Management;Electrical Stimulation;Iontophoresis 438mml Dexamethasone;Moist Heat;Gait training;Stair training;Therapeutic activities;Therapeutic exercise;Balance training;Neuromuscular re-education;Patient/family education;Manual techniques;Passive range of motion;Dry needling;Taping;Spinal Manipulations;Joint Manipulations;Traction    PT Next Visit  Plan strengthening, balance, core stability    Consulted and Agree with Plan of Care Patient  Patient will benefit from skilled therapeutic intervention in order to improve the following deficits and impairments:  Abnormal gait, Difficulty walking, Increased muscle spasms, Decreased activity tolerance, Pain, Decreased balance, Hypomobility, Decreased strength, Impaired flexibility, Postural dysfunction  Visit Diagnosis: Acute bilateral low back pain without sciatica  Pain in right hip  Pain in left hip  Cramp and spasm  Difficulty in walking, not elsewhere classified  Chronic pain of left knee     Problem List Patient Active Problem List   Diagnosis Date Noted   Bilateral hip pain 12/05/2020   Foot swelling 10/05/2020   Drug-induced hyperkalemia 03/12/2020   Educated about COVID-19 virus infection 10/19/2019   Coarse tremors 06/10/2016   Routine general medical examination at a health care facility 02/07/2016   CAD (coronary artery disease) 05/01/2015   CKD (chronic kidney disease), stage III ([place]) 05/01/2015   BPH with obstruction/lower urinary tract symptoms 11/13/2010   BARRETTS ESOPHAGUS 01/24/2010   OSTEOARTHRITIS, KNEES, BILATERAL 10/01/2009   Hyperlipidemia with target LDL less than 70 09/21/2007   Chronic ischemic heart disease 09/21/2007   ERECTILE DYSFUNCTION 03/09/2007   Essential hypertension 03/09/2007    Juel Burrow, PT, DPT 02/11/2021, 11:49 AM  [place]. [place], [place], [address] Phone: [phone]   Fax:  [phone]  Name: JIMMYLEE RATTERREE MRN: [id number] Date of Birth: 06/23/1941

## 2021-02-19 ENCOUNTER — Other Ambulatory Visit: Payer: Self-pay

## 2021-02-19 ENCOUNTER — Ambulatory Visit: Payer: PPO | Admitting: Rehabilitative and Restorative Service Providers"

## 2021-02-19 ENCOUNTER — Encounter: Payer: Self-pay | Admitting: Rehabilitative and Restorative Service Providers"

## 2021-02-19 DIAGNOSIS — R262 Difficulty in walking, not elsewhere classified: Secondary | ICD-10-CM

## 2021-02-19 DIAGNOSIS — M25551 Pain in right hip: Secondary | ICD-10-CM

## 2021-02-19 DIAGNOSIS — M545 Low back pain, unspecified: Secondary | ICD-10-CM

## 2021-02-19 DIAGNOSIS — R252 Cramp and spasm: Secondary | ICD-10-CM

## 2021-02-19 NOTE — Therapy (Signed)
[place]. [place], [place], [address] Phone: [phone]   Fax:  [phone]  Physical Therapy Treatment  Patient Details  Name: [name] MRN: [id number] Date of Birth: 1941/02/18 Referring Provider (PT): Dr Georgina Snell   Encounter Date: 02/19/2021   PT End of Session - 02/19/21 1019     Visit Number 9    Date for PT Re-Evaluation 03/22/21    PT Start Time 1015    PT Stop Time 1056    PT Time Calculation (min) 41 min    Activity Tolerance Patient tolerated treatment well    Behavior During Therapy [facility] for tasks assessed/performed             Past Medical History:  Diagnosis Date   Anemia    Barrett's esophagus    Blood transfusion without reported diagnosis    CAD (coronary artery disease)    2002 stenting of the RCA,, 2006 Stenting of diag with DES, PTCA of Circ, 50% LAD   Cancer ([place])    skin cancer scalp and forehead   Chronic kidney disease    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 - decreased dose of HCTZ to M,F   Diverticulosis    DJD (degenerative joint disease)    Duodenal ulcer    ED (erectile dysfunction)    GERD (gastroesophageal reflux disease)    increased s/s in the last week 03-22-19 PV    Hemorrhoids    Hyperlipidemia    Hypertension    Myocardial infarction ([place])    2002   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   mid frontal scalp-cx41f   SCC (squamous cell carcinoma) 06/15/2019   front middle scalp-tx cx336f  SCC (squamous cell carcinoma) 12/27/2002   CIS-right angle of jaw--cx3514f SCC (squamous cell carcinoma) 09/24/2004   CIS--mid post crown, left mis crown medial,left first web space-tx cx35f20fSCC (squamous cell carcinoma) 06/03/2006   SCCA-right temple-tx cx35fu29fCC (squamous cell carcinoma) 06/03/2006   CIS-right forehead, left mis crown scalp medial-tx cx35fu 60fC (squamous cell carcinoma) 07/30/2006   right temple lateral-txpbx   SCC (squamous cell carcinoma)  11/10/2006   left forehead, left hand proximal   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/02/2009   left scalp-cx35fu  47f (squamous cell carcinoma) 01/24/2015   left scalp-cx35fu   65f(squamous cell carcinoma) 11/12/2015   front lateralscalp-cx35fu, me22f scalp-cx35fu   SC55fquamous cell carcinoma) 12/17/2016   front center scalp-cx35fu   Squ68fs cell carcinoma of skin 08/10/2017   CIS--right ear rim, top of scalp, left side scalp-tx-cx35fu    Pas61frgical History:  Procedure Laterality Date   COLONOSCOPY     ELBOW ARTHROSCOPY     heart stents     2002,2004   SPINAL FUSION     SPINAL FUSION     SPINE SURGERY     cervical   UPPER GASTROINTESTINAL ENDOSCOPY      There were no vitals filed for this visit.   Subjective Assessment - 02/19/21 1018     Subjective That patch did the trick, I am feeling so much better since you put it on.    Currently in Pain? No/denies                               [facility] 0001       High Level  Balance   High Level Balance Activities Tandem walking;Side stepping   on foam beam     Knee/Hip Exercises: Aerobic   Recumbent Bike L2.5 x6 min      Knee/Hip Exercises: Machines for Strengthening   Cybex Knee Extension 20# 2x10    Cybex Knee Flexion 35# 2x8    Cybex Leg Press 30# 2x12      Knee/Hip Exercises: Standing   Hip Flexion Stengthening;Both;1 set;10 reps    Hip Flexion Limitations 5#    Hip Abduction Stengthening;Both;1 set;10 reps    Abduction Limitations 5#    Hip Extension Stengthening;Both;1 set;10 reps    Extension Limitations 5#    Lateral Step Up Both;10 reps;Hand Hold: 2;Step Height: 6"    Lateral Step Up Limitations over bosu and back    Forward Step Up Both;10 reps;Hand Hold: 2;Step Height: 6"    Forward Step Up Limitations up on bosu    Step Down Both;1 set;10 reps;Hand Hold: 2;Step Height: 4"    Other Standing Knee Exercises High marching 5# x10 bilat                       PT Short Term Goals - 01/10/21 1316       PT SHORT TERM GOAL #1   Title Pt will be I with initial HEP    Status Achieved               PT Long Term Goals - 02/19/21 1142       PT LONG TERM GOAL #1   Title Pt will be I with advanced HEP    Status Partially Met      PT LONG TERM GOAL #2   Title Pt will demo able to ascend/descend stairs with no increase in pain.    Status Partially Met      PT LONG TERM GOAL #3   Title Pt will report 50% reduction in L knee pain    Status Achieved      PT LONG TERM GOAL #4   Title Pt will demo B hip ext/abd MMT >/= 4+/5 with no increase in hip pain    Status Partially Met      PT LONG TERM GOAL #5   Title Patient will report a decrease in back pain and an improvement in back pain of at least 75%.    Status Achieved                   Plan - 02/19/21 1104     Clinical Impression Statement Patient has made great progress since using iontophoresis last session and did not have any pain throughout session.  He was able to tolerated increased/more challenging exercises.  He did well with balance activities and required occasional UE skupport for balance.  He continues to require skilled PT to progress towards goal related activities.    PT Treatment/Interventions ADLs/Self Care Home Management;Electrical Stimulation;Iontophoresis 31m/ml Dexamethasone;Moist Heat;Gait training;Stair training;Therapeutic activities;Therapeutic exercise;Balance training;Neuromuscular re-education;Patient/family education;Manual techniques;Passive range of motion;Dry needling;Taping;Spinal Manipulations;Joint Manipulations;Traction    PT Next Visit Plan Assess FOTO, strengthening, balance, core stability    Consulted and Agree with Plan of Care Patient             Patient will benefit from skilled therapeutic intervention in order to improve the following deficits and impairments:  Abnormal gait, Difficulty walking,  Increased muscle spasms, Decreased activity tolerance, Pain, Decreased balance, Hypomobility, Decreased strength, Impaired flexibility, Postural dysfunction  Visit Diagnosis: Acute  bilateral low back pain without sciatica  Pain in right hip  Cramp and spasm  Difficulty in walking, not elsewhere classified     Problem List Patient Active Problem List   Diagnosis Date Noted   Bilateral hip pain 12/05/2020   Foot swelling 10/05/2020   Drug-induced hyperkalemia 03/12/2020   Educated about COVID-19 virus infection 10/19/2019   Coarse tremors 06/10/2016   Routine general medical examination at a health care facility 02/07/2016   CAD (coronary artery disease) 05/01/2015   CKD (chronic kidney disease), stage III ([place]) 05/01/2015   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 [place], [place], [address] Phone: [phone]   Fax:  [phone]  Name: [name] MRN: [id number] Date of Birth: [birth year]

## 2021-02-26 ENCOUNTER — Encounter: Payer: Self-pay | Admitting: Rehabilitative and Restorative Service Providers"

## 2021-02-26 ENCOUNTER — Other Ambulatory Visit: Payer: Self-pay

## 2021-02-26 ENCOUNTER — Ambulatory Visit: Payer: PPO | Attending: Family Medicine | Admitting: Rehabilitative and Restorative Service Providers"

## 2021-02-26 DIAGNOSIS — R252 Cramp and spasm: Secondary | ICD-10-CM | POA: Insufficient documentation

## 2021-02-26 DIAGNOSIS — M545 Low back pain, unspecified: Secondary | ICD-10-CM | POA: Diagnosis not present

## 2021-02-26 DIAGNOSIS — R262 Difficulty in walking, not elsewhere classified: Secondary | ICD-10-CM | POA: Insufficient documentation

## 2021-02-26 DIAGNOSIS — M25551 Pain in right hip: Secondary | ICD-10-CM | POA: Insufficient documentation

## 2021-02-26 NOTE — Therapy (Signed)
[place] [place], [place], [address] Phone: [phone]   Fax:  [phone]  Physical Therapy Treatment and Discharge Summary  Patient Details  Name: [name] MRN: [id number] Date of Birth: 12-05-1940 Referring Provider (PT): Dr Georgina Snell   Encounter Date: 02/26/2021   PT End of Session - 02/26/21 1020     Visit Number 10    Date for PT Re-Evaluation 03/22/21    PT Start Time 1012    PT Stop Time 1055    PT Time Calculation (min) 43 min    Activity Tolerance Patient tolerated treatment well    Behavior During Therapy [facility] for tasks assessed/performed             Past Medical History:  Diagnosis Date   Anemia    Barrett's esophagus    Blood transfusion without reported diagnosis    CAD (coronary artery disease)    2002 stenting of the RCA,, 2006 Stenting of diag with DES, PTCA of Circ, 50% LAD   Cancer ([place])    skin cancer scalp and forehead   Chronic kidney disease    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 - decreased dose of HCTZ to M,F   Diverticulosis    DJD (degenerative joint disease)    Duodenal ulcer    ED (erectile dysfunction)    GERD (gastroesophageal reflux disease)    increased s/s in the last week 03-22-19 PV    Hemorrhoids    Hyperlipidemia    Hypertension    Myocardial infarction ([place])    2002   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   mid frontal scalp-cx3f   SCC (squamous cell carcinoma) 06/15/2019   front middle scalp-tx cx36f  SCC (squamous cell carcinoma) 12/27/2002   CIS-right angle of jaw--[id number] SCC (squamous cell carcinoma) 09/24/2004   CIS--mid post crown, left mis crown medial,left first web space-tx [id number] (squamous cell carcinoma) 06/03/2006   SCCA-right temple-tx [id number] (squamous cell carcinoma) 06/03/2006   CIS-right forehead, left mis crown scalp medial-tx cx35fu 19fC (squamous cell carcinoma) 07/30/2006   right temple lateral-txpbx   SCC  (squamous cell carcinoma) 11/10/2006   left forehead, left hand proximal   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/02/2009   left scalp-cx35fu  55f (squamous cell carcinoma) 01/24/2015   left scalp-cx35fu   33f(squamous cell carcinoma) 11/12/2015   front lateralscalp-cx35fu, me39f scalp-cx35fu   SC15fquamous cell carcinoma) 12/17/2016   front center scalp-cx35fu   Squ26fs cell carcinoma of skin 08/10/2017   CIS--right ear rim, top of scalp, left side scalp-tx-cx35fu    Pas61frgical History:  Procedure Laterality Date   COLONOSCOPY     ELBOW ARTHROSCOPY     heart stents     2002,2004   SPINAL FUSION     SPINAL FUSION     SPINE SURGERY     cervical   UPPER GASTROINTESTINAL ENDOSCOPY      There were no vitals filed for this visit.   Subjective Assessment - 02/26/21 1020     Subjective I am only having pain if I walk for a long time.  I am ready for DC.    Patient Stated Goals reduce pain    Currently in Pain? No/denies                [facility] - 02/26/21 0001       Observation/Other Assessments   Focus  on Therapeutic Outcomes (FOTO)  82%                           [facility] Adult PT Treatment/Exercise - 02/26/21 0001       Ambulation/Gait   Ambulation/Gait Yes    Ambulation Distance (Feet) 800 Feet    Gait Pattern Within Functional Limits    Ambulation Surface Unlevel;Outdoor;Paved    Gait velocity normal    Stairs Yes    Stairs Assistance 7: Independent    Stair Management Technique One rail Right    Number of Stairs 14      Knee/Hip Exercises: Aerobic   Recumbent Bike L3.0 x6 min      Knee/Hip Exercises: Machines for Strengthening   Cybex Knee Extension 20# 2x10    Cybex Knee Flexion 35# 2x10    Cybex Leg Press 30# 2x12    Hip Cybex 3 way hip x15 with green tband      Knee/Hip Exercises: Seated   Sit to Sand 10 reps   with yellow wt ball x10 with CP, x10 with OHP                     PT Short Term Goals -  01/10/21 1316       PT SHORT TERM GOAL #1   Title Pt will be I with initial HEP    Status Achieved               PT Long Term Goals - 02/26/21 1058       PT LONG TERM GOAL #1   Title Pt will be I with advanced HEP    Status Achieved      PT LONG TERM GOAL #2   Title Pt will demo able to ascend/descend stairs with no increase in pain.    Status Achieved      PT LONG TERM GOAL #3   Title Pt will report 50% reduction in L knee pain    Status Achieved      PT LONG TERM GOAL #4   Title Pt will demo B hip ext/abd MMT >/= 4+/5 with no increase in hip pain    Status Achieved      PT LONG TERM GOAL #5   Title Patient will report a decrease in back pain and an improvement in back pain of at least 75%.    Status Achieved                   Plan - 02/26/21 1033     Clinical Impression Statement [name] has made excellent progress with skilled outpatient PT and has met all goals.  He reports that he only occasionally has R hip pain, but only if he has been walking for extended periods on sidewalks.  Pt has has had a significant decrease in his R hip pain since one use of ionto patch with Dextamethasone.  He has returned to his walking and gym program with his wife and is ready to be discharged from outpatient PT at this time.    PT Treatment/Interventions ADLs/Self Care Home Management;Electrical Stimulation;Iontophoresis 40m/ml Dexamethasone;Moist Heat;Gait training;Stair training;Therapeutic activities;Therapeutic exercise;Balance training;Neuromuscular re-education;Patient/family education;Manual techniques;Passive range of motion;Dry needling;Taping;Spinal Manipulations;Joint Manipulations;Traction    PT Next Visit Plan Pt discharged    PT Home Exercise Plan Continue at gym and daily ambulation program with wife    Consulted and Agree with Plan of Care Patient  Patient will benefit from skilled therapeutic intervention in order to improve the  following deficits and impairments:  Abnormal gait, Difficulty walking, Increased muscle spasms, Decreased activity tolerance, Pain, Decreased balance, Hypomobility, Decreased strength, Impaired flexibility, Postural dysfunction  Visit Diagnosis: Acute bilateral low back pain without sciatica  Pain in right hip  Cramp and spasm  Difficulty in walking, not elsewhere classified     Problem List Patient Active Problem List   Diagnosis Date Noted   Bilateral hip pain 12/05/2020   Foot swelling 10/05/2020   Drug-induced hyperkalemia 03/12/2020   Educated about COVID-19 virus infection 10/19/2019   Coarse tremors 06/10/2016   Routine general medical examination at a health care facility 02/07/2016   CAD (coronary artery disease) 05/01/2015   CKD (chronic kidney disease), stage III ([place]) 05/01/2015   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 Patient goals were met. Patient is being discharged due to meeting the stated rehab goals.   [name], PT, DPT 02/26/2021, 11:00 AM  [name]. [place], [place], [address] Phone: [phone]   Fax:  [phone]  Name: [name] MRN: [id number] Date of Birth: 1941/03/20

## 2021-03-14 ENCOUNTER — Ambulatory Visit (INDEPENDENT_AMBULATORY_CARE_PROVIDER_SITE_OTHER): Payer: PPO | Admitting: Internal Medicine

## 2021-03-14 ENCOUNTER — Other Ambulatory Visit: Payer: Self-pay

## 2021-03-14 ENCOUNTER — Ambulatory Visit: Payer: PPO

## 2021-03-14 ENCOUNTER — Telehealth: Payer: Self-pay | Admitting: Internal Medicine

## 2021-03-14 ENCOUNTER — Encounter: Payer: Self-pay | Admitting: Internal Medicine

## 2021-03-14 VITALS — BP 142/84 | HR 52 | Temp 98.5°F | Ht 66.0 in | Wt 174.4 lb

## 2021-03-14 DIAGNOSIS — Z0000 Encounter for general adult medical examination without abnormal findings: Secondary | ICD-10-CM | POA: Diagnosis not present

## 2021-03-14 DIAGNOSIS — I10 Essential (primary) hypertension: Secondary | ICD-10-CM | POA: Diagnosis not present

## 2021-03-14 DIAGNOSIS — E785 Hyperlipidemia, unspecified: Secondary | ICD-10-CM

## 2021-03-14 DIAGNOSIS — N1832 Chronic kidney disease, stage 3b: Secondary | ICD-10-CM | POA: Diagnosis not present

## 2021-03-14 LAB — LIPID PANEL
Cholesterol: 130 mg/dL (ref 0–200)
HDL: 48.7 mg/dL (ref >39.00)
LDL Cholesterol: 55 mg/dL (ref 0–99)
NonHDL: 81.79
Total CHOL/HDL Ratio: 3
Triglycerides: 135 mg/dL (ref 0.0–149.0)
VLDL: 27 mg/dL (ref 0.0–40.0)

## 2021-03-14 LAB — URINALYSIS, ROUTINE W REFLEX MICROSCOPIC
Bilirubin Urine: NEGATIVE
Hgb urine dipstick: NEGATIVE
Ketones, ur: NEGATIVE
Leukocytes,Ua: NEGATIVE
Nitrite: NEGATIVE
Specific Gravity, Urine: 1.015 (ref 1.000–1.030)
Total Protein, Urine: NEGATIVE
Urine Glucose: NEGATIVE
Urobilinogen, UA: 0.2 (ref 0.0–1.0)
pH: 6 (ref 5.0–8.0)

## 2021-03-14 LAB — CBC WITH DIFFERENTIAL/PLATELET
Basophils Absolute: 0.1 10*3/uL (ref 0.0–0.1)
Basophils Relative: 0.7 % (ref 0.0–3.0)
Eosinophils Absolute: 0.2 10*3/uL (ref 0.0–0.7)
Eosinophils Relative: 3.1 % (ref 0.0–5.0)
HCT: 43.9 % (ref 39.0–52.0)
Hemoglobin: 14.7 g/dL (ref 13.0–17.0)
Lymphocytes Relative: 38 % (ref 12.0–46.0)
Lymphs Abs: 2.9 10*3/uL (ref 0.7–4.0)
MCHC: 33.6 g/dL (ref 30.0–36.0)
MCV: 95.1 fl (ref 78.0–100.0)
Monocytes Absolute: 0.8 10*3/uL (ref 0.1–1.0)
Monocytes Relative: 11.1 % (ref 3.0–12.0)
Neutro Abs: 3.6 10*3/uL (ref 1.4–7.7)
Neutrophils Relative %: 47.1 % (ref 43.0–77.0)
Platelets: 217 10*3/uL (ref 150.0–400.0)
RBC: 4.62 Mil/uL (ref 4.22–5.81)
RDW: 13.5 % (ref 11.5–15.5)
WBC: 7.7 10*3/uL (ref 4.0–10.5)

## 2021-03-14 LAB — HEPATIC FUNCTION PANEL
ALT: 19 U/L (ref 0–53)
AST: 22 U/L (ref 0–37)
Albumin: 4.3 g/dL (ref 3.5–5.2)
Alkaline Phosphatase: 65 U/L (ref 39–117)
Bilirubin, Direct: 0.1 mg/dL (ref 0.0–0.3)
Total Bilirubin: 0.5 mg/dL (ref 0.2–1.2)
Total Protein: 7.1 g/dL (ref 6.0–8.3)

## 2021-03-14 LAB — BASIC METABOLIC PANEL
BUN: 20 mg/dL (ref 6–23)
CO2: 30 mEq/L (ref 19–32)
Calcium: 10.1 mg/dL (ref 8.4–10.5)
Chloride: 102 mEq/L (ref 96–112)
Creatinine, Ser: 1.37 mg/dL (ref 0.40–1.50)
GFR: 48.04 mL/min — ABNORMAL LOW (ref >60.00)
Glucose, Bld: 100 mg/dL — ABNORMAL HIGH (ref 70–99)
Potassium: 5 mEq/L (ref 3.5–5.1)
Sodium: 138 mEq/L (ref 135–145)

## 2021-03-14 LAB — TSH: TSH: 1.13 u[IU]/mL (ref 0.35–5.50)

## 2021-03-14 NOTE — Progress Notes (Signed)
Subjective:  Patient ID: [name], male    DOB: 1941/07/25  Age: 80 y.o. MRN: [id number]  CC: Annual Exam, Hypertension, and Hyperlipidemia  This visit occurred during the SARS-CoV-2 public health emergency.  Safety protocols were in place, including screening questions prior to the visit, additional usage of staff PPE, and extensive cleaning of exam room while observing appropriate contact time as indicated for disinfecting solutions.    HPI DYLAN MONFORTE presents for a CPX and f/up -   He walks 2 miles a day and does not experience CP, DOE, diaphoresis, edema, palpitations, or fatigue.  Outpatient Medications Prior to Visit  Medication Sig Dispense Refill   atenolol (TENORMIN) 50 MG tablet TAKE ONE TABLET BY MOUTH AT BREAKFAST AND AT BEDTIME 180 tablet 0   atorvastatin (LIPITOR) 20 MG tablet Take 1 tablet (20 mg total) by mouth daily. 90 tablet 3   Coenzyme Q10 (CO Q 10 PO) Take 1 tablet by mouth daily.     hydrALAZINE (APRESOLINE) 25 MG tablet TAKE 1 TABLET BY MOUTH IN THE MORNING AND AT BEDTIME. 180 tablet 3   KRILL OIL PO Take 1 tablet by mouth daily.     lisinopril (ZESTRIL) 20 MG tablet Take 1 tablet (20 mg total) by mouth daily. 90 tablet 3   Multiple Vitamins-Minerals (CENTRUM SILVER 50+MEN PO) Take 1 tablet by mouth.     nitroGLYCERIN (NITROSTAT) 0.4 MG SL tablet PLACE 1 TABLET UNDER THE TONGUE EVERY 5 MINUTES AS NEEDED. 450 tablet 1   pantoprazole (PROTONIX) 40 MG tablet TAKE ONE TABLET BY MOUTH EVERY MORNING 90 tablet 0   primidone (MYSOLINE) 50 MG tablet Take 1 tablet (50 mg total) by mouth 2 (two) times daily. 180 tablet 1   amLODipine (NORVASC) 10 MG tablet Take 1 tablet (10 mg total) by mouth daily. 30 tablet 11   tiZANidine (ZANAFLEX) 4 MG tablet Take 1 tablet (4 mg total) by mouth every 8 (eight) hours as needed for muscle spasms. (Patient not taking: Reported on 03/14/2021) 30 tablet 1   No facility-administered medications prior to visit.    ROS Review  of Systems  Constitutional:  Negative for diaphoresis, fatigue and unexpected weight change.  HENT: Negative.    Eyes: Negative.   Respiratory:  Negative for cough, chest tightness, shortness of breath and wheezing.   Cardiovascular:  Negative for chest pain, palpitations and leg swelling.  Gastrointestinal:  Negative for abdominal pain, constipation, diarrhea, nausea and vomiting.  Endocrine: Negative.   Genitourinary: Negative.  Negative for difficulty urinating.  Musculoskeletal:  Positive for arthralgias. Negative for myalgias.  Skin: Negative.   Neurological: Negative.  Negative for dizziness, syncope and light-headedness.  Hematological:  Negative for adenopathy. Does not bruise/bleed easily.  Psychiatric/Behavioral: Negative.     Objective:  BP (!) 142/84 (BP Location: Left Arm, Patient Position: Sitting, Cuff Size: Large)   Pulse (!) 52   Temp 98.5 F (36.9 C) (Oral)   Ht 5\' 6"  (1.676 m)   Wt 174 lb 6.4 oz (79.1 kg)   SpO2 96%   BMI 28.15 kg/m   BP Readings from Last 3 Encounters:  03/14/21 (!) 142/84  01/24/21 130/78  12/05/20 110/70    Wt Readings from Last 3 Encounters:  03/14/21 174 lb 6.4 oz (79.1 kg)  01/24/21 177 lb 6.4 oz (80.5 kg)  12/05/20 177 lb (80.3 kg)    Physical Exam Vitals reviewed.  Constitutional:      Appearance: Normal appearance.  HENT:  Nose: Nose normal.     Mouth/Throat:     Mouth: Mucous membranes are moist.  Eyes:     General: No scleral icterus.    Conjunctiva/sclera: Conjunctivae normal.  Cardiovascular:     Rate and Rhythm: Regular rhythm. Bradycardia present.     Heart sounds: Normal heart sounds, S1 normal and S2 normal. No murmur heard. Pulmonary:     Effort: Pulmonary effort is normal.     Breath sounds: Normal breath sounds. No wheezing or rales.  Abdominal:     General: Abdomen is flat.     Palpations: There is no mass.     Tenderness: There is no abdominal tenderness. There is no guarding.     Hernia: No hernia  is present.  Musculoskeletal:     Cervical back: Neck supple.     Right lower leg: No edema.     Left lower leg: No edema.  Skin:    General: Skin is warm and dry.  Neurological:     General: No focal deficit present.     Mental Status: He is alert.  Psychiatric:        Mood and Affect: Mood normal.        Behavior: Behavior normal.    Lab Results  Component Value Date   WBC 7.7 03/14/2021   HGB 14.7 03/14/2021   HCT 43.9 03/14/2021   PLT 217.0 03/14/2021   GLUCOSE 100 (H) 03/14/2021   CHOL 130 03/14/2021   TRIG 135.0 03/14/2021   HDL 48.70 03/14/2021   LDLCALC 55 03/14/2021   ALT 19 03/14/2021   AST 22 03/14/2021   NA 138 03/14/2021   K 5.0 03/14/2021   CL 102 03/14/2021   CREATININE 1.37 03/14/2021   BUN 20 03/14/2021   CO2 30 03/14/2021   TSH 1.13 03/14/2021   PSA 2.62 02/15/2018    EXERCISE TOLERANCE TEST (ETT)  Result Date: 10/13/2018  Blood pressure demonstrated a normal response to exercise.  There was no ST segment deviation noted during stress.  No chest pain noted with stress  HR response was submaximal, reducing the sensitivity of this test to detect ischemia.     Assessment & Plan:   Sarkis was seen today for annual exam, hypertension and hyperlipidemia.  Diagnoses and all orders for this visit:  Essential hypertension- His blood pressure is adequately well controlled.  Electrolytes are normal and renal function is stable. -     CBC with Differential/Platelet; Future -     Basic metabolic panel; Future -     Urinalysis, Routine w reflex microscopic; Future -     Hepatic function panel; Future -     Hepatic function panel -     Urinalysis, Routine w reflex microscopic -     Basic metabolic panel -     CBC with Differential/Platelet  Stage 3b chronic kidney disease ([place])- His blood pressure is adequately well controlled. -     CBC with Differential/Platelet; Future -     Basic metabolic panel; Future -     Urinalysis, Routine w reflex  microscopic; Future -     Urinalysis, Routine w reflex microscopic -     Basic metabolic panel -     CBC with Differential/Platelet  Routine general medical examination at a health care facility- Exam completed, labs reviewed, vaccines are up-to-date, no cancer screenings indicated, patient education was given.  Hyperlipidemia with target LDL less than 70- LDL goal achieved. Doing well on the statin  -  Lipid panel; Future -     TSH; Future -     Hepatic function panel; Future -     Hepatic function panel -     TSH -     Lipid panel  I am having [name] "Joe" maintain his Multiple Vitamins-Minerals (CENTRUM SILVER 50+MEN PO), Coenzyme Q10 (CO Q 10 PO), KRILL OIL PO, nitroGLYCERIN, lisinopril, amLODipine, atorvastatin, hydrALAZINE, primidone, tiZANidine, pantoprazole, and atenolol.  No orders of the defined types were placed in this encounter.    Follow-up: Return in about 6 months (around 09/14/2021).  Scarlette Calico, MD

## 2021-03-14 NOTE — Patient Instructions (Signed)
Health Maintenance, Male Adopting a healthy lifestyle and getting preventive care are important in promoting health and wellness. Ask your health care provider about: The right schedule for you to have regular tests and exams. Things you can do on your own to prevent diseases and keep yourself healthy. What should I know about diet, weight, and exercise? Eat a healthy diet  Eat a diet that includes plenty of vegetables, fruits, low-fat dairy products, and lean protein. Do not eat a lot of foods that are high in solid fats, added sugars, or sodium.  Maintain a healthy weight Body mass index (BMI) is a measurement that can be used to identify possible weight problems. It estimates body fat based on height and weight. Your health care provider can help determine your BMI and help you achieve or maintain ahealthy weight. Get regular exercise Get regular exercise. This is one of the most important things you can do for your health. Most adults should: Exercise for at least 150 minutes each week. The exercise should increase your heart rate and make you sweat (moderate-intensity exercise). Do strengthening exercises at least twice a week. This is in addition to the moderate-intensity exercise. Spend less time sitting. Even light physical activity can be beneficial. Watch cholesterol and blood lipids Have your blood tested for lipids and cholesterol at 82 years of age, then havethis test every 5 years. You may need to have your cholesterol levels checked more often if: Your lipid or cholesterol levels are high. You are older than 82 years of age. You are at high risk for heart disease. What should I know about cancer screening? Many types of cancers can be detected early and may often be prevented. Depending on your health history and family history, you may need to have cancer screening at various ages. This may include screening for: Colorectal cancer. Prostate cancer. Skin cancer. Lung  cancer. What should I know about heart disease, diabetes, and high blood pressure? Blood pressure and heart disease High blood pressure causes heart disease and increases the risk of stroke. This is more likely to develop in people who have high blood pressure readings, are of African descent, or are overweight. Talk with your health care provider about your target blood pressure readings. Have your blood pressure checked: Every 3-5 years if you are 18-39 years of age. Every year if you are 40 years old or older. If you are between the ages of 65 and 75 and are a current or former smoker, ask your health care provider if you should have a one-time screening for abdominal aortic aneurysm (AAA). Diabetes Have regular diabetes screenings. This checks your fasting blood sugar level. Have the screening done: Once every three years after age 45 if you are at a normal weight and have a low risk for diabetes. More often and at a younger age if you are overweight or have a high risk for diabetes. What should I know about preventing infection? Hepatitis B If you have a higher risk for hepatitis B, you should be screened for this virus. Talk with your health care provider to find out if you are at risk forhepatitis B infection. Hepatitis C Blood testing is recommended for: Everyone born from 1945 through 1965. Anyone with known risk factors for hepatitis C. Sexually transmitted infections (STIs) You should be screened each year for STIs, including gonorrhea and chlamydia, if: You are sexually active and are younger than 82 years of age. You are older than 82 years of age   and your health care provider tells you that you are at risk for this type of infection. Your sexual activity has changed since you were last screened, and you are at increased risk for chlamydia or gonorrhea. Ask your health care provider if you are at risk. Ask your health care provider about whether you are at high risk for HIV.  Your health care provider may recommend a prescription medicine to help prevent HIV infection. If you choose to take medicine to prevent HIV, you should first get tested for HIV. You should then be tested every 3 months for as long as you are taking the medicine. Follow these instructions at home: Lifestyle Do not use any products that contain nicotine or tobacco, such as cigarettes, e-cigarettes, and chewing tobacco. If you need help quitting, ask your health care provider. Do not use street drugs. Do not share needles. Ask your health care provider for help if you need support or information about quitting drugs. Alcohol use Do not drink alcohol if your health care provider tells you not to drink. If you drink alcohol: Limit how much you have to 0-2 drinks a day. Be aware of how much alcohol is in your drink. In the [place] one drink equals one 12 oz bottle of beer (355 mL), one 5 oz glass of wine (148 mL), or one 1 oz glass of hard liquor (44 mL). General instructions Schedule regular health, dental, and eye exams. Stay current with your vaccines. Tell your health care provider if: You often feel depressed. You have ever been abused or do not feel safe at home. Summary Adopting a healthy lifestyle and getting preventive care are important in promoting health and wellness. Follow your health care provider's instructions about healthy diet, exercising, and getting tested or screened for diseases. Follow your health care provider's instructions on monitoring your cholesterol and blood pressure. This information is not intended to replace advice given to you by your health care provider. Make sure you discuss any questions you have with your healthcare provider. Document Revised: 08/04/2018 Document Reviewed: 08/04/2018 Elsevier Patient Education  2022 [employer]  

## 2021-03-14 NOTE — Telephone Encounter (Signed)
LVM for pt to rtn my call to r/s appt with [facility] on 03/14/21. Please r/s appt if pt comes into the office or calls.

## 2021-03-15 ENCOUNTER — Encounter: Payer: Self-pay | Admitting: Internal Medicine

## 2021-03-20 ENCOUNTER — Other Ambulatory Visit: Payer: Self-pay

## 2021-03-20 ENCOUNTER — Ambulatory Visit (INDEPENDENT_AMBULATORY_CARE_PROVIDER_SITE_OTHER): Payer: PPO

## 2021-03-20 VITALS — BP 116/70 | HR 50 | Temp 98.2°F | Ht 66.0 in | Wt 175.4 lb

## 2021-03-20 DIAGNOSIS — Z0000 Encounter for general adult medical examination without abnormal findings: Secondary | ICD-10-CM | POA: Diagnosis not present

## 2021-03-20 NOTE — Progress Notes (Signed)
Subjective:   [name] is a 80 y.o. male who presents for Medicare Annual/Subsequent preventive examination.  Review of Systems     Cardiac Risk Factors include: advanced age (>69mn, >>40women);dyslipidemia;family history of premature cardiovascular disease;hypertension;male gender     Objective:    Today's Vitals   03/20/21 1406  BP: 116/70  Pulse: (!) 50  Temp: 98.2 F (36.8 C)  SpO2: 96%  Weight: 175 lb 6.4 oz (79.6 kg)  Height: '5\' 6"'$  (1.676 m)  PainSc: 0-No pain   Body mass index is 28.31 kg/m.  Advanced Directives 03/20/2021 10/18/2019 04/14/2019 11/16/2017 03/06/2017 03/28/2016 02/07/2016  Does Patient Have a Medical Advance Directive? Yes Yes Yes No Yes Yes Yes  Type of Advance Directive Living will;Healthcare Power of [place] will  Does patient want to make changes to medical advance directive? No - Patient declined - - - - - No - Patient declined  Copy of [place] Chart? No - copy requested - No - copy requested - Yes No - copy requested Yes  Would patient like information on creating a medical advance directive? - - - No - Patient declined - - -    Current Medications (verified) Outpatient Encounter Medications as of 03/20/2021  Medication Sig   amLODipine (NORVASC) 10 MG tablet Take 1 tablet (10 mg total) by mouth daily.   atenolol (TENORMIN) 50 MG tablet TAKE ONE TABLET BY MOUTH AT BREAKFAST AND AT BEDTIME   atorvastatin (LIPITOR) 20 MG tablet Take 1 tablet (20 mg total) by mouth daily.   Coenzyme Q10 (CO Q 10 PO) Take 1 tablet by mouth daily.   hydrALAZINE (APRESOLINE) 25 MG tablet TAKE 1 TABLET BY MOUTH IN THE MORNING AND AT BEDTIME.   KRILL OIL PO Take 1 tablet by mouth daily.   lisinopril (ZESTRIL) 20 MG tablet Take 1 tablet (20 mg total) by mouth daily.   Multiple  Vitamins-Minerals (CENTRUM SILVER 50+MEN PO) Take 1 tablet by mouth.   nitroGLYCERIN (NITROSTAT) 0.4 MG SL tablet PLACE 1 TABLET UNDER THE TONGUE EVERY 5 MINUTES AS NEEDED.   pantoprazole (PROTONIX) 40 MG tablet TAKE ONE TABLET BY MOUTH EVERY MORNING   primidone (MYSOLINE) 50 MG tablet Take 1 tablet (50 mg total) by mouth 2 (two) times daily.   tiZANidine (ZANAFLEX) 4 MG tablet Take 1 tablet (4 mg total) by mouth every 8 (eight) hours as needed for muscle spasms. (Patient not taking: Reported on 03/14/2021)   No facility-administered encounter medications on file as of 03/20/2021.    Allergies (verified) Patient has no allergy information on record.   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 - decreased dose of HCTZ to M,F   Diverticulosis    DJD (degenerative joint disease)    Duodenal ulcer    ED (erectile dysfunction)    GERD (gastroesophageal reflux disease)    increased s/s in the last week 03-22-19 PV    Hemorrhoids    Hyperlipidemia  Hypertension    Myocardial infarction ([place])    2002   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   mid frontal scalp-cx54f   SCC (squamous cell carcinoma) 06/15/2019   front middle scalp-tx cx374f  SCC (squamous cell carcinoma) 12/27/2002   CIS-right angle of jaw--[id number] SCC (squamous cell carcinoma) 09/24/2004   CIS--mid post crown, left mis crown medial,left first web space-tx [id number] (squamous cell carcinoma) 06/03/2006   SCCA-right temple-tx [id number] (squamous cell carcinoma) 06/03/2006   CIS-right forehead, left mis crown scalp medial-tx cx35fu 37fC (squamous cell carcinoma) 07/30/2006   right temple lateral-txpbx    SCC (squamous cell carcinoma) 11/10/2006   left forehead, left hand proximal   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/02/2009   left scalp-cx35fu  29f (squamous cell carcinoma) 01/24/2015   left scalp-cx35fu   25f(squamous cell carcinoma) 11/12/2015   front lateralscalp-cx35fu, me1f scalp-cx35fu   SC33fquamous cell carcinoma) 12/17/2016   front center scalp-cx35fu   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   CIS--right ear rim, top of scalp, left side scalp-tx-cx35fu   Past44fgical History:  Procedure Laterality Date   COLONOSCOPY     ELBOW ARTHROSCOPY     heart stents     2002,2004   SPINAL FUSION     SPINAL FUSION     SPINE SURGERY     cervical   UPPER GASTROINTESTINAL ENDOSCOPY     Family History  Problem Relation Age of Onset   Alcohol abuse Other    Coronary artery disease Other    Alzheimer's disease Other    Dementia Mother    Colon cancer Neg Hx    Esophageal cancer Neg Hx    Rectal cancer Neg Hx    Stomach cancer Neg Hx    Colon polyps Neg Hx    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  Tobacco Use   Smoking status: Former    Types: Cigarettes    Quit date: 08/25/1961    Years since quitting: 59.6   Smokeless tobacco: Never  Vaping Use   Vaping Use: Never used  Substance and Sexual Activity   Alcohol use: Yes    Comment: rarely   Drug use: No   Sexual activity: Not Currently  Other Topics Concern   Not on file  Social History Narrative   Right handed   1 Story home    live spouse   Social Determinants of Health   Financial Resource Strain: Low Risk    Difficulty of Paying Living Expenses: Not hard at all  Food Insecurity: No Food Insecurity   Worried About Running Out Charity fundraiser Year: Never true   Ran Out of  Academic librarian Year: Never true  Transportation Needs: No Transportation Needs   Lack of Transportation (Medical): No   Lack of Transportation (Non-Medical): No  Physical Activity: Sufficiently Active   Days of Exercise per Week: 5 days   Minutes of Exercise per Session: 30 min  Stress: No Stress Concern Present   Feeling of Stress : Not at all  Social Connections: Socially Integrated   Frequency of Communication with Friends and Family: More than three  times a week   Frequency of Social Gatherings with Friends and Family: More than three times a week   Attends Religious Services: More than 4 times per year   Active Member of [employer] or Organizations: Yes   Attends Music therapist: More than 4 times per year   Marital Status: Married    Tobacco Counseling Counseling given: Not Answered   Clinical Intake:  Pre-visit preparation completed: Yes  Pain : No/denies pain Pain Score: 0-No pain     BMI - recorded: 28.31 Nutritional Status: BMI 25 -29 Overweight Nutritional Risks: None Diabetes: No  How often do you need to have someone help you when you read instructions, pamphlets, or other written materials from your doctor or pharmacy?: 1 - Never What is the last grade level you completed in school?: Master's Degree from [employer]; Retired Engineer, maintenance (IT)  Diabetic? no  Interpreter Needed?: No  Information entered by :: Lisette Abu, LPN   Activities of Daily Living In your present state of health, do you have any difficulty performing the following activities: 03/20/2021 10/04/2020  Hearing? N N  Vision? N N  Difficulty concentrating or making decisions? N N  Walking or climbing stairs? N N  Dressing or bathing? N N  Doing errands, shopping? N N  Preparing Food and eating ? N -  Using the Toilet? N -  In the past six months, have you accidently leaked urine? N -  Do you have problems with loss of bowel control? N -  Managing your Medications?  N -  Managing your Finances? N -  Housekeeping or managing your Housekeeping? N -  Some recent data might be hidden    Patient Care Team: [name], MD as PCP - General (Internal Medicine) Minus Breeding, MD as PCP - Cardiology (Cardiology) Lavonna Monarch, MD as Consulting Physician (Dermatology) Tat, Eustace Quail, DO as Consulting Physician (Neurology) Charlton Haws, [facility] as Pharmacist (Pharmacist) Juluis Rainier as Consulting Physician (Optometry)  Indicate any recent Medical Services you may have received from other than [facility] providers in the past year (date may be approximate).     Assessment:   This is a routine wellness examination for Zechariah.  Hearing/Vision screen Hearing Screening - Comments:: Patient denied any hearing difficulty. Vision Screening - Comments:: Patient wears glasses.  Eye exams done once a year by Dr. Alois Cliche.  Dietary issues and exercise activities discussed: Current Exercise Habits: Home exercise routine, Type of exercise: walking, Time (Minutes): 30, Frequency (Times/Week): 5, Weekly Exercise (Minutes/Week): 150, Intensity: Moderate, Exercise limited by: cardiac condition(s)   Goals Addressed               This Visit's Progress     Patient Stated (pt-stated)        My goal is to increase my physical activity.      Depression Screen PHQ 2/9 Scores 03/20/2021 03/14/2021 10/04/2020 04/14/2019 03/10/2019 03/06/2017 03/03/2017  PHQ - 2 Score 0 0 0 0 0 0 0  PHQ- 9 Score - - - - - - 0    Fall Risk Fall Risk  03/20/2021 03/14/2021 11/26/2020 11/07/2020 10/18/2019  Falls in the past year? 0 0 0 0 0  Number falls in past yr: 0 0 0 - 0  Injury with Fall? 0 0 0 - 0  Risk Factor Category  - - - - -  Risk for fall due to : No Fall Risks - - - -  Follow up Falls evaluation  completed - Falls evaluation completed - -    FALL RISK PREVENTION PERTAINING TO THE HOME:  Any stairs in or around the home? No  If so, are there any without handrails? No   Home free of loose throw rugs in walkways, pet beds, electrical cords, etc? Yes  Adequate lighting in your home to reduce risk of falls? Yes   ASSISTIVE DEVICES UTILIZED TO PREVENT FALLS:  Life alert? No  Use of a cane, walker or w/c? No  Grab bars in the bathroom? No  Shower chair or bench in shower? No  Elevated toilet seat or a handicapped toilet? No   TIMED UP AND GO:  Was the test performed? Yes .  Length of time to ambulate 10 feet: 6 sec.   Gait steady and fast without use of assistive device  Cognitive Function: Normal cognitive status assessed by direct observation by this Nurse Health Advisor. No abnormalities found.          Immunizations Immunization History  Administered Date(s) Administered   Fluad Quad(high Dose 65+) 05/07/2019, 06/14/2020   H1N1 09/22/2008   Influenza Split 05/18/2012   Influenza Whole 08/25/2005, 06/08/2008, 07/04/2009, 06/24/2010   Influenza, High Dose Seasonal PF 06/21/2015, 05/08/2016, 06/27/2017, 06/14/2018   Influenza,inj,Quad PF,6+ Mos 07/20/2013, 06/23/2014   PFIZER(Purple Top)SARS-COV-2 Vaccination 09/05/2019, 09/25/2019, 07/03/2020   Pneumococcal Conjugate-13 01/18/2014   Pneumococcal Polysaccharide-23 08/25/2002, 09/21/2007, 02/06/2016   Td 08/25/2006   Tdap 09/22/2016   Zoster, Live 10/20/2008    TDAP status: Up to date  Flu Vaccine status: Up to date  Pneumococcal vaccine status: Up to date  Covid-19 vaccine status: Completed vaccines  Qualifies for Shingles Vaccine? Yes   Zostavax completed Yes   Shingrix Completed?: No.    Education has been provided regarding the importance of this vaccine. Patient has been advised to call insurance company to determine out of pocket expense if they have not yet received this vaccine. Advised may also receive vaccine at local pharmacy or Health Dept. Verbalized acceptance and understanding.  Screening Tests Health Maintenance  Topic Date Due   Zoster Vaccines- Shingrix (1 of 2)  Never done   COVID-19 Vaccine (4 - Booster for Pfizer series) 10/03/2020   INFLUENZA VACCINE  03/25/2021   TETANUS/TDAP  09/22/2026   PNA vac Low Risk Adult  Completed   HPV VACCINES  Aged Out    Health Maintenance  Health Maintenance Due  Topic Date Due   Zoster Vaccines- Shingrix (1 of 2) Never done   COVID-19 Vaccine (4 - Booster for Pfizer series) 10/03/2020    Colorectal cancer screening: No longer required.   Lung Cancer Screening: (Low Dose CT Chest recommended if Age 21-80 years, 30 pack-year currently smoking OR have quit w/in 15years.) does not qualify.   Lung Cancer Screening Referral: no  Additional Screening:  Hepatitis C Screening: does not qualify; Completed no  Vision Screening: Recommended annual ophthalmology exams for early detection of glaucoma and other disorders of the eye. Is the patient up to date with their annual eye exam?  Yes  Who is the provider or what is the name of the office in which the patient attends annual eye exams? Alois Cliche, OD. If pt is not established with a provider, would they like to be referred to a provider to establish care? No .   Dental Screening: Recommended annual dental exams for proper oral hygiene  Community Resource Referral / Chronic Care Management: CRR required this visit?  No   CCM required this  visit?  No      Plan:     I have personally reviewed and noted the following in the patient's chart:   Medical and social history Use of alcohol, tobacco or illicit drugs  Current medications and supplements including opioid prescriptions. Patient is not currently taking opioid prescriptions. Functional ability and status Nutritional status Physical activity Advanced directives List of other physicians Hospitalizations, surgeries, and ER visits in previous 12 months Vitals Screenings to include cognitive, depression, and falls Referrals and appointments  In addition, I have reviewed and discussed with  patient certain preventive protocols, quality metrics, and best practice recommendations. A written personalized care plan for preventive services as well as general preventive health recommendations were provided to patient.     Sheral Flow, LPN   [id number]   Nurse Notes: n/a

## 2021-03-20 NOTE — Patient Instructions (Addendum)
[name] , Thank you for taking time to come for your Medicare Wellness Visit. I appreciate your ongoing commitment to your health goals. Please review the following plan we discussed and let me know if I can assist you in the future.   Screening recommendations/referrals: Colonoscopy: not a candidate for colon cancer screening due to age. Recommended yearly ophthalmology/optometry visit for glaucoma screening and checkup Recommended yearly dental visit for hygiene and checkup  Vaccinations: Influenza vaccine: 06/14/2020 Pneumococcal vaccine: 01/18/2014, 02/06/2016 Tdap vaccine: 09/22/2016; due every 10 years Shingles vaccine: Please call your insurance company to determine your out of pocket expense for the Shingrix vaccine. You may receive this vaccine at your local pharmacy.   Covid-19: 09/05/2019, 09/25/2019, 07/03/2020  Advanced directives: Please bring a copy of your health care power of attorney and living will to the office at your convenience.  Conditions/risks identified: Yes; Client understands the importance of follow-up with providers by attending scheduled visits and discussed goals to eat healthier, increase physical activity, exercise the brain, socialize more, get enough sleep and make time for laughter.  Next appointment: Please schedule your next Medicare Wellness Visit with your Nurse Health Advisor in 1 year by calling [phone].  Preventive Care 48 Years and Older, Male Preventive care refers to lifestyle choices and visits with your health care provider that can promote health and wellness. What does preventive care include? A yearly physical exam. This is also called an annual well check. Dental exams once or twice a year. Routine eye exams. Ask your health care provider how often you should have your eyes checked. Personal lifestyle choices, including: Daily care of your teeth and gums. Regular physical activity. Eating a healthy diet. Avoiding tobacco and drug  use. Limiting alcohol use. Practicing safe sex. Taking low doses of aspirin every day. Taking vitamin and mineral supplements as recommended by your health care provider. What happens during an annual well check? The services and screenings done by your health care provider during your annual well check will depend on your age, overall health, lifestyle risk factors, and family history of disease. Counseling  Your health care provider may ask you questions about your: Alcohol use. Tobacco use. Drug use. Emotional well-being. Home and relationship well-being. Sexual activity. Eating habits. History of falls. Memory and ability to understand (cognition). Work and work Statistician. Screening  You may have the following tests or measurements: Height, weight, and BMI. Blood pressure. Lipid and cholesterol levels. These may be checked every 5 years, or more frequently if you are over 70 years old. Skin check. Lung cancer screening. You may have this screening every year starting at age 63 if you have a 30-pack-year history of smoking and currently smoke or have quit within the past 15 years. Fecal occult blood test (FOBT) of the stool. You may have this test every year starting at age 22. Flexible sigmoidoscopy or colonoscopy. You may have a sigmoidoscopy every 5 years or a colonoscopy every 10 years starting at age 81. Prostate cancer screening. Recommendations will vary depending on your family history and other risks. Hepatitis C blood test. Hepatitis B blood test. Sexually transmitted disease (STD) testing. Diabetes screening. This is done by checking your blood sugar (glucose) after you have not eaten for a while (fasting). You may have this done every 1-3 years. Abdominal aortic aneurysm (AAA) screening. You may need this if you are a current or former smoker. Osteoporosis. You may be screened starting at age 64 if you are at high risk.  Talk with your health care provider about  your test results, treatment options, and if necessary, the need for more tests. Vaccines  Your health care provider may recommend certain vaccines, such as: Influenza vaccine. This is recommended every year. Tetanus, diphtheria, and acellular pertussis (Tdap, Td) vaccine. You may need a Td booster every 10 years. Zoster vaccine. You may need this after age 44. Pneumococcal 13-valent conjugate (PCV13) vaccine. One dose is recommended after age 66. Pneumococcal polysaccharide (PPSV23) vaccine. One dose is recommended after age 74. Talk to your health care provider about which screenings and vaccines you need and how often you need them. This information is not intended to replace advice given to you by your health care provider. Make sure you discuss any questions you have with your health care provider. Document Released: 09/07/2015 Document Revised: 04/30/2016 Document Reviewed: 06/12/2015 Elsevier Interactive Patient Education  2017 [place] Prevention in the Home Falls can cause injuries. They can happen to people of all ages. There are many things you can do to make your home safe and to help prevent falls. What can I do on the outside of my home? Regularly fix the edges of walkways and driveways and fix any cracks. Remove anything that might make you trip as you walk through a door, such as a raised step or threshold. Trim any bushes or trees on the path to your home. Use bright outdoor lighting. Clear any walking paths of anything that might make someone trip, such as rocks or tools. Regularly check to see if handrails are loose or broken. Make sure that both sides of any steps have handrails. Any raised decks and porches should have guardrails on the edges. Have any leaves, snow, or ice cleared regularly. Use sand or salt on walking paths during winter. Clean up any spills in your garage right away. This includes oil or grease spills. What can I do in the bathroom? Use  night lights. Install grab bars by the toilet and in the tub and shower. Do not use towel bars as grab bars. Use non-skid mats or decals in the tub or shower. If you need to sit down in the shower, use a plastic, non-slip stool. Keep the floor dry. Clean up any water that spills on the floor as soon as it happens. Remove soap buildup in the tub or shower regularly. Attach bath mats securely with double-sided non-slip rug tape. Do not have throw rugs and other things on the floor that can make you trip. What can I do in the bedroom? Use night lights. Make sure that you have a light by your bed that is easy to reach. Do not use any sheets or blankets that are too big for your bed. They should not hang down onto the floor. Have a firm chair that has side arms. You can use this for support while you get dressed. Do not have throw rugs and other things on the floor that can make you trip. What can I do in the kitchen? Clean up any spills right away. Avoid walking on wet floors. Keep items that you use a lot in easy-to-reach places. If you need to reach something above you, use a strong step stool that has a grab bar. Keep electrical cords out of the way. Do not use floor polish or wax that makes floors slippery. If you must use wax, use non-skid floor wax. Do not have throw rugs and other things on the floor that can make  you trip. What can I do with my stairs? Do not leave any items on the stairs. Make sure that there are handrails on both sides of the stairs and use them. Fix handrails that are broken or loose. Make sure that handrails are as long as the stairways. Check any carpeting to make sure that it is firmly attached to the stairs. Fix any carpet that is loose or worn. Avoid having throw rugs at the top or bottom of the stairs. If you do have throw rugs, attach them to the floor with carpet tape. Make sure that you have a light switch at the top of the stairs and the bottom of the  stairs. If you do not have them, ask someone to add them for you. What else can I do to help prevent falls? Wear shoes that: Do not have high heels. Have rubber bottoms. Are comfortable and fit you well. Are closed at the toe. Do not wear sandals. If you use a stepladder: Make sure that it is fully opened. Do not climb a closed stepladder. Make sure that both sides of the stepladder are locked into place. Ask someone to hold it for you, if possible. Clearly mark and make sure that you can see: Any grab bars or handrails. First and last steps. Where the edge of each step is. Use tools that help you move around (mobility aids) if they are needed. These include: Canes. Walkers. Scooters. Crutches. Turn on the lights when you go into a dark area. Replace any light bulbs as soon as they burn out. Set up your furniture so you have a clear path. Avoid moving your furniture around. If any of your floors are uneven, fix them. If there are any pets around you, be aware of where they are. Review your medicines with your doctor. Some medicines can make you feel dizzy. This can increase your chance of falling. Ask your doctor what other things that you can do to help prevent falls. This information is not intended to replace advice given to you by your health care provider. Make sure you discuss any questions you have with your health care provider. Document Released: 06/07/2009 Document Revised: 01/17/2016 Document Reviewed: 09/15/2014 Elsevier Interactive Patient Education  2017 [employer].

## 2021-04-01 DIAGNOSIS — N401 Enlarged prostate with lower urinary tract symptoms: Secondary | ICD-10-CM | POA: Diagnosis not present

## 2021-04-01 DIAGNOSIS — N5201 Erectile dysfunction due to arterial insufficiency: Secondary | ICD-10-CM | POA: Diagnosis not present

## 2021-04-01 DIAGNOSIS — R351 Nocturia: Secondary | ICD-10-CM | POA: Diagnosis not present

## 2021-04-17 ENCOUNTER — Telehealth: Payer: Self-pay | Admitting: Pharmacist

## 2021-04-17 NOTE — Progress Notes (Signed)
Chronic Care Management Pharmacy Assistant   Name: [name]  MRN: [id number] DOB: 04-Apr-1941   Reason for Encounter: Disease State   Conditions to be addressed/monitored: General   Recent office visits:  03/14/21 [name], MD (PCP) Routine general medical examination at a health care facility- Exam completed, labs reviewed, vaccines are up-to-date, no cancer screenings indicated, patient education was given.  Recent consult visits:  01/24/21 Gregor Hams, MD (Lumbosacral strain, initial encounter)   tiZANidine (ZANAFLEX) 4 MG tablet  Hospital visits:  None in previous 6 months  Medications: Outpatient Encounter Medications as of 04/17/2021  Medication Sig   amLODipine (NORVASC) 10 MG tablet Take 1 tablet (10 mg total) by mouth daily.   atenolol (TENORMIN) 50 MG tablet TAKE ONE TABLET BY MOUTH AT BREAKFAST AND AT BEDTIME   atorvastatin (LIPITOR) 20 MG tablet Take 1 tablet (20 mg total) by mouth daily.   Coenzyme Q10 (CO Q 10 PO) Take 1 tablet by mouth daily.   hydrALAZINE (APRESOLINE) 25 MG tablet TAKE 1 TABLET BY MOUTH IN THE MORNING AND AT BEDTIME.   KRILL OIL PO Take 1 tablet by mouth daily.   lisinopril (ZESTRIL) 20 MG tablet Take 1 tablet (20 mg total) by mouth daily.   Multiple Vitamins-Minerals (CENTRUM SILVER 50+MEN PO) Take 1 tablet by mouth.   nitroGLYCERIN (NITROSTAT) 0.4 MG SL tablet PLACE 1 TABLET UNDER THE TONGUE EVERY 5 MINUTES AS NEEDED.   pantoprazole (PROTONIX) 40 MG tablet TAKE ONE TABLET BY MOUTH EVERY MORNING   primidone (MYSOLINE) 50 MG tablet Take 1 tablet (50 mg total) by mouth 2 (two) times daily.   tiZANidine (ZANAFLEX) 4 MG tablet Take 1 tablet (4 mg total) by mouth every 8 (eight) hours as needed for muscle spasms. (Patient not taking: Reported on 03/14/2021)   No facility-administered encounter medications on file as of 04/17/2021.    Contacted HARDY WICHT on 04/17/21 for general disease state and medication adherence call.    Patient is not > 5 days past due for refill on the following medications per chart history:  [facility] Medications: Medication Name/mg Last Fill Days Supply Lisinopril 20 mg  02/15/21 90 Atorvastatin 20 mg  02/15/21 90  What concerns do you have about your medications? Patient states that he has no concerns about medication  The patient denies side effects with his medications.   How often do you forget or accidentally miss a dose? Never  Do you use a pillbox? No, but he states that he uses a small cup that he keeps by his bed and fills it with his medications that he is going to take for the day  Are you having any problems getting your medications from your pharmacy? No, [facility] and get medications delivered  Has the cost of your medications been a concern? No If yes, what medication and is patient assistance available or has it been applied for?  Since last visit with [facility]PP, the following interventions have been made: continue current medications, per Dr. Ronnald Ramp  The patient has not had an ED visit since last contact.   The patient denies problems with their health.   he denies  concerns or questions for [employer], Pharm. D at this time.   Counseled patient on:  Great job taking medications   Care Gaps: Last annual wellness visit:03/14/21 If Diabetic: Last eye exam / retinopathy screening: Last diabetic foot exam:  Cardiology appointment on 06/17/21    Ethelene Hal Clinical  Pharmacist Assistant [phone]   Time spent:29

## 2021-05-03 ENCOUNTER — Telehealth: Payer: Self-pay | Admitting: Pharmacist

## 2021-05-03 NOTE — Progress Notes (Signed)
    Chronic Care Management Pharmacy Assistant   Name: [name]  MRN: [id number] DOB: 10/14/1940   Reason for Encounter: Medication Review    Recent office visits:  None ID  Recent consult visits:  None ID  Hospital visits:  None in previous 6 months  Medications: Outpatient Encounter Medications as of 05/03/2021  Medication Sig   amLODipine (NORVASC) 10 MG tablet Take 1 tablet (10 mg total) by mouth daily.   atenolol (TENORMIN) 50 MG tablet TAKE ONE TABLET BY MOUTH AT BREAKFAST AND AT BEDTIME   atorvastatin (LIPITOR) 20 MG tablet Take 1 tablet (20 mg total) by mouth daily.   Coenzyme Q10 (CO Q 10 PO) Take 1 tablet by mouth daily.   hydrALAZINE (APRESOLINE) 25 MG tablet TAKE 1 TABLET BY MOUTH IN THE MORNING AND AT BEDTIME.   KRILL OIL PO Take 1 tablet by mouth daily.   lisinopril (ZESTRIL) 20 MG tablet Take 1 tablet (20 mg total) by mouth daily.   Multiple Vitamins-Minerals (CENTRUM SILVER 50+MEN PO) Take 1 tablet by mouth.   nitroGLYCERIN (NITROSTAT) 0.4 MG SL tablet PLACE 1 TABLET UNDER THE TONGUE EVERY 5 MINUTES AS NEEDED.   pantoprazole (PROTONIX) 40 MG tablet TAKE ONE TABLET BY MOUTH EVERY MORNING   primidone (MYSOLINE) 50 MG tablet Take 1 tablet (50 mg total) by mouth 2 (two) times daily.   tiZANidine (ZANAFLEX) 4 MG tablet Take 1 tablet (4 mg total) by mouth every 8 (eight) hours as needed for muscle spasms. (Patient not taking: Reported on 03/14/2021)   No facility-administered encounter medications on file as of 05/03/2021.   Reviewed chart for medication changes ahead of medication coordination call.  No OVs, Consults, or hospital visits since last care coordination call/Pharmacist visit. (If appropriate, list visit date, provider name)  No medication changes indicated OR if recent visit, treatment plan here.  BP Readings from Last 3 Encounters:  03/20/21 116/70  03/14/21 (!) 142/84  01/24/21 130/78    No results found for: HGBA1C   Patient obtains  medications through [facility]herence [facility]  90 Days   Last adherence delivery included:  Amlodipine 10 mg 1 tab bedtime Atenolol 50 mg 1 tab breakfast & bedtime    Atorvastatin 20 mg 1 tab bedtime Hydralazine 25 mg 1 tab breakfast & bedtime Lisinopril 20 mg 1 tab bedtime    Nitroglycerin Sub 0.4 Dissolve 1 tab under tongue prn for pain      Pantoprazole 40 mg 1 tab morning Primidone 50 mg 1 tab breakfast & bedtime  Patient is due for next adherence delivery on: 05/20/21.  Called patient and reviewed medications and coordinated delivery. This delivery to include: Amlodipine 10 mg 1 tab bedtime Atenolol 50 mg 1 tab breakfast & bedtime    Atorvastatin 20 mg 1 tab bedtime Hydralazine 25 mg 1 tab breakfast & bedtime Lisinopril 20 mg 1 tab bedtime    Nitroglycerin Sub 0.4 Dissolve 1 tab under tongue prn for pain      Pantoprazole 40 mg 1 tab morning Primidone 50 mg 1 tab breakfast & bedtime  Patient needs refills for pantoprazole, atenolol, hydralazine, lisinopril and primidone.  Confirmed delivery date of 05/20/21, advised patient that pharmacy will contact them the morning of delivery.   [place] Pharmacist Assistant [phone]   Time spent:28

## 2021-05-06 ENCOUNTER — Other Ambulatory Visit: Payer: Self-pay | Admitting: Internal Medicine

## 2021-05-06 ENCOUNTER — Other Ambulatory Visit: Payer: Self-pay | Admitting: Cardiology

## 2021-05-06 DIAGNOSIS — K2270 Barrett's esophagus without dysplasia: Secondary | ICD-10-CM

## 2021-05-06 DIAGNOSIS — I10 Essential (primary) hypertension: Secondary | ICD-10-CM

## 2021-05-06 DIAGNOSIS — K219 Gastro-esophageal reflux disease without esophagitis: Secondary | ICD-10-CM

## 2021-05-06 DIAGNOSIS — I2510 Atherosclerotic heart disease of native coronary artery without angina pectoris: Secondary | ICD-10-CM

## 2021-05-06 DIAGNOSIS — G252 Other specified forms of tremor: Secondary | ICD-10-CM

## 2021-06-16 NOTE — Progress Notes (Signed)
Cardiology Office Note   Date:  06/17/2021   ID:  [name], DOB 20-Aug-1941, MRN [id number]  PCP:  [name], MD  Cardiologist:   Minus Breeding, MD   Chief Complaint  Patient presents with   Coronary Artery Disease       History of Present Illness: [name] is a 80 y.o. male who presents for follow up of his CAD.  He returns for yearly follow up.  He has done very well.  He has done some hiking in [facility] and goes to [employer] and walks on the sand.  The patient denies any new symptoms such as chest discomfort, neck or arm discomfort. There has been no new shortness of breath, PND or orthopnea. There have been no reported palpitations, presyncope or syncope.    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 - decreased dose of HCTZ to M,F   Diverticulosis    DJD (degenerative joint disease)    Duodenal ulcer    ED (erectile dysfunction)    GERD (gastroesophageal reflux disease)    increased s/s in the last week 03-22-19 PV    Hemorrhoids    Hyperlipidemia    Hypertension    Myocardial infarction ([place])    2002   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   mid frontal scalp-[id number]   SCC (squamous cell carcinoma) 06/15/2019   front middle scalp-tx cx17fu   SCC (squamous cell carcinoma) 12/27/2002   CIS-right angle of jaw--cx74fu   SCC (squamous cell carcinoma) 09/24/2004   CIS--mid post crown, left mis crown medial,left first web space-tx cx33fu   SCC (squamous cell carcinoma) 06/03/2006   SCCA-right temple-tx cx63fu   SCC (squamous cell carcinoma) 06/03/2006   CIS-right forehead, left mis crown scalp medial-tx cx58fu   SCC (squamous cell carcinoma)  07/30/2006   right temple lateral-txpbx   SCC (squamous cell carcinoma) 11/10/2006   left forehead, left hand proximal   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/02/2009   left scalp-cx41fu   SCC (squamous cell carcinoma) 01/24/2015   left scalp-cx52fu   SCC (squamous cell carcinoma) 11/12/2015   front lateralscalp-cx71fu, medial scalp-cx54fu   SCC (squamous cell carcinoma) 12/17/2016   front center scalp-cx61fu   Squamous cell carcinoma of skin 08/10/2017   CIS--right ear rim, top of scalp, left side scalp-tx-cx49fu    Past Surgical History:  Procedure Laterality Date   COLONOSCOPY     ELBOW ARTHROSCOPY     heart stents     2002,2004   SPINAL FUSION     SPINAL FUSION     SPINE SURGERY     cervical   UPPER GASTROINTESTINAL ENDOSCOPY       Current Outpatient Medications  Medication Sig Dispense Refill   aspirin EC 81 MG tablet Take 81 mg by mouth daily. Swallow whole.     atenolol (TENORMIN) 50 MG tablet TAKE ONE TABLET BY MOUTH EVERY MORNING and TAKE ONE TABLET BY MOUTH EVERYDAY AT BEDTIME 180 tablet 0   atorvastatin (LIPITOR) 20 MG tablet Take 1 tablet (20 mg total) by mouth daily. 90 tablet 3  Coenzyme Q10 (CO Q 10 PO) Take 1 tablet by mouth daily.     hydrALAZINE (APRESOLINE) 25 MG tablet TAKE ONE TABLET BY MOUTH AT BREAKFAST AND AT BEDTIME 180 tablet 3   KRILL OIL PO Take 1 tablet by mouth daily.     lisinopril (ZESTRIL) 20 MG tablet TAKE ONE TABLET BY MOUTH EVERYDAY AT BEDTIME 90 tablet 3   Multiple Vitamins-Minerals (CENTRUM SILVER 50+MEN PO) Take 1 tablet by mouth.     nitroGLYCERIN (NITROSTAT) 0.4 MG SL tablet PLACE 1 TABLET UNDER THE TONGUE EVERY 5 MINUTES AS NEEDED. 450 tablet 1   pantoprazole (PROTONIX) 40 MG tablet TAKE ONE TABLET BY MOUTH EVERY MORNING 90 tablet 0   primidone (MYSOLINE) 50 MG tablet TAKE ONE TABLET BY MOUTH EVERY MORNING and TAKE ONE TABLET BY MOUTH EVERYDAY AT BEDTIME 180 tablet 1   amLODipine (NORVASC) 10 MG tablet Take 1 tablet (10 mg total) by  mouth daily. 30 tablet 11   tiZANidine (ZANAFLEX) 4 MG tablet Take 1 tablet (4 mg total) by mouth every 8 (eight) hours as needed for muscle spasms. 30 tablet 1   No current facility-administered medications for this visit.    Allergies:   Patient has no allergy information on record.    ROS:  Please see the history of present illness.   Otherwise, review of systems are positive for none.   All other systems are reviewed and negative.    PHYSICAL EXAM: VS:  BP 128/76   Pulse (!) 52   Ht 5\' 6"  (1.676 m)   Wt 174 lb (78.9 kg)   SpO2 96%   BMI 28.08 kg/m  , BMI Body mass index is 28.08 kg/m.  GENERAL:  Well appearing NECK:  No jugular venous distention, waveform within normal limits, carotid upstroke brisk and symmetric, no bruits, no thyromegaly LUNGS:  Clear to auscultation bilaterally CHEST:  Unremarkable HEART:  PMI not displaced or sustained,S1 and S2 within normal limits, no S3, no S4, no clicks, no rubs, no murmurs ABD:  Flat, positive bowel sounds normal in frequency in pitch, no bruits, no rebound, no guarding, no midline pulsatile mass, no hepatomegaly, no splenomegaly EXT:  2 plus pulses throughout, no edema, no cyanosis no clubbing   EKG:  EKG is  ordered today. Sinus rhythm rate 52, axis within normal limits, intervals within normal limits, no acute ST-T wave changes.   Recent Labs: 10/04/2020: Pro B Natriuretic peptide (BNP) 146.0 03/14/2021: ALT 19; BUN 20; Creatinine, Ser 1.37; Hemoglobin 14.7; Platelets 217.0; Potassium 5.0; Sodium 138; TSH 1.13    Lipid Panel    Component Value Date/Time   CHOL 130 03/14/2021 1039   TRIG 135.0 03/14/2021 1039   TRIG 161 (H) 08/28/2006 1023   HDL 48.70 03/14/2021 1039   CHOLHDL 3 03/14/2021 1039   VLDL 27.0 03/14/2021 1039   LDLCALC 55 03/14/2021 1039   LDLCALC 60 03/12/2020 1144      Wt Readings from Last 3 Encounters:  06/17/21 174 lb (78.9 kg)  03/20/21 175 lb 6.4 oz (79.6 kg)  03/14/21 174 lb 6.4 oz (79.1 kg)       Other studies Reviewed: Additional studies/ records that were reviewed today include: None. Review of the above records demonstrates:  See elsewhere    ASSESSMENT AND PLAN:   CORONARY ARTERY DISEASE, S/P PTCA -  The patient has no new sypmtoms.  No further cardiovascular testing is indicated.  We will continue with aggressive risk reduction and meds as listed with the  exception of restarting 81 mg aspirin which he was taking in the past.  HYPERLIPIDEMIA -  His LDL was 55.  No change in therapy.    HYPERTENSION -  The blood pressure is well controlled.  Continue the meds as listed.   Current medicines are reviewed at length with the patient today.  The patient does not have concerns regarding medicines.  The following changes have been made: As above  Labs/ tests ordered today include: None  Orders Placed This Encounter  Procedures   EKG 12-Lead      Disposition:   FU with me in 12 months.     Signed, Minus Breeding, MD  06/17/2021 10:31 AM    [facility]

## 2021-06-17 ENCOUNTER — Encounter: Payer: Self-pay | Admitting: Cardiology

## 2021-06-17 ENCOUNTER — Ambulatory Visit: Payer: PPO | Admitting: Cardiology

## 2021-06-17 ENCOUNTER — Other Ambulatory Visit: Payer: Self-pay

## 2021-06-17 VITALS — BP 128/76 | HR 52 | Ht 66.0 in | Wt 174.0 lb

## 2021-06-17 DIAGNOSIS — I2510 Atherosclerotic heart disease of native coronary artery without angina pectoris: Secondary | ICD-10-CM

## 2021-06-17 DIAGNOSIS — E785 Hyperlipidemia, unspecified: Secondary | ICD-10-CM | POA: Diagnosis not present

## 2021-06-17 DIAGNOSIS — I10 Essential (primary) hypertension: Secondary | ICD-10-CM

## 2021-06-17 NOTE — Patient Instructions (Addendum)
Medication Instructions:  MAKE SURE YOU ARE TAKING ASPIRIN 81 MG DAILY   *If you need a refill on your cardiac medications before your next appointment, please call your pharmacy*  Lab Work: NONE   Testing/Procedures: NONE   Follow-Up: At [facility], you and your health needs are our priority.  As part of our continuing mission to provide you with exceptional heart care, we have created designated Provider Care Teams.  These Care Teams include your primary Cardiologist (physician) and Advanced Practice Providers (APPs -  Physician Assistants and Nurse Practitioners) who all work together to provide you with the care you need, when you need it.  We recommend signing up for the patient portal called "MyChart".  Sign up information is provided on this After Visit Summary.  MyChart is used to connect with patients for Virtual Visits (Telemedicine).  Patients are able to view lab/test results, encounter notes, upcoming appointments, etc.  Non-urgent messages can be sent to your provider as well.   To learn more about what you can do with MyChart, go to [email or web address].    Your next appointment:   12 month(s)  The format for your next appointment:   In Person  Provider:   You may see Minus Breeding, MD or one of the following Advanced Practice Providers on your designated Care Team:   Rosaria Ferries, PA-C Caron Presume, PA-C Jory Sims, DNP, ANP

## 2021-08-05 ENCOUNTER — Encounter: Payer: Self-pay | Admitting: Cardiology

## 2021-08-05 DIAGNOSIS — H40013 Open angle with borderline findings, low risk, bilateral: Secondary | ICD-10-CM | POA: Diagnosis not present

## 2021-08-05 DIAGNOSIS — H353112 Nonexudative age-related macular degeneration, right eye, intermediate dry stage: Secondary | ICD-10-CM | POA: Diagnosis not present

## 2021-08-09 ENCOUNTER — Other Ambulatory Visit: Payer: Self-pay | Admitting: Cardiology

## 2021-08-09 ENCOUNTER — Other Ambulatory Visit: Payer: Self-pay | Admitting: Internal Medicine

## 2021-08-09 DIAGNOSIS — E785 Hyperlipidemia, unspecified: Secondary | ICD-10-CM

## 2021-08-09 DIAGNOSIS — I10 Essential (primary) hypertension: Secondary | ICD-10-CM

## 2021-08-09 DIAGNOSIS — K2270 Barrett's esophagus without dysplasia: Secondary | ICD-10-CM

## 2021-08-09 DIAGNOSIS — K219 Gastro-esophageal reflux disease without esophagitis: Secondary | ICD-10-CM

## 2021-08-09 DIAGNOSIS — I2510 Atherosclerotic heart disease of native coronary artery without angina pectoris: Secondary | ICD-10-CM

## 2021-10-29 ENCOUNTER — Other Ambulatory Visit: Payer: Self-pay

## 2021-10-29 ENCOUNTER — Ambulatory Visit: Payer: PPO | Admitting: Dermatology

## 2021-10-29 ENCOUNTER — Encounter: Payer: Self-pay | Admitting: Dermatology

## 2021-10-29 DIAGNOSIS — D485 Neoplasm of uncertain behavior of skin: Secondary | ICD-10-CM

## 2021-10-29 DIAGNOSIS — L570 Actinic keratosis: Secondary | ICD-10-CM | POA: Diagnosis not present

## 2021-10-29 DIAGNOSIS — D044 Carcinoma in situ of skin of scalp and neck: Secondary | ICD-10-CM

## 2021-10-29 NOTE — Patient Instructions (Signed)

## 2021-11-09 ENCOUNTER — Encounter: Payer: Self-pay | Admitting: Dermatology

## 2021-11-09 NOTE — Progress Notes (Signed)
? ?  Follow-Up Visit ?  ?Subjective  ?[name] is a 81 y.o. male who presents for the following: Skin Problem (Lesion on hand when appt was made but is gone now. Lesion on forehead x year. ). ? ?Crust on hand spontaneously resolved, 1 on frontal scalp persists plus several smaller ones ?Location:  ?Duration:  ?Quality:  ?Associated Signs/Symptoms: ?Modifying Factors:  ?Severity:  ?Timing: ?Context:  ? ?Objective  ?Well appearing patient in no apparent distress; mood and affect are within normal limits. ?Mid Frontal Scalp, Mid Parietal Scalp (4) ?Gritty 4 to 6 mm pink crusts ? ?Mid Frontal Scalp ?8 mm crust frontal scalp, probable superficial carcinoma ? ? ? ? ? ? ? ? ?All skin waist up examined. ? ? ?Assessment & Plan  ? ? ?AK (actinic keratosis) (5) ?Mid Frontal Scalp; Mid Parietal Scalp (4) ? ?Destruction of lesion - Mid Frontal Scalp, Mid Parietal Scalp (2) ?Complexity: simple   ?Destruction method: cryotherapy   ?Informed consent: discussed and consent obtained   ?Timeout:  patient name, date of birth, surgical site, and procedure verified ?Lesion destroyed using liquid nitrogen: Yes   ?Cryotherapy cycles:  3 ?Outcome: patient tolerated procedure well with no complications   ?Post-procedure details: wound care instructions given   ? 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 ?Skin / nail biopsy ?Type of biopsy: tangential   ?Informed consent: discussed and consent obtained   ?Timeout: patient name, date of birth, surgical site, and procedure verified   ?Anesthesia: the lesion was anesthetized in a standard fashion   ?Anesthetic:  1% lidocaine w/ epinephrine 1-100,000 local infiltration ?Instrument used: flexible razor blade   ?Hemostasis achieved with: ferric subsulfate and electrodesiccation   ?Outcome: patient tolerated procedure well   ?Post-procedure details: wound care instructions given   ? ?Destruction of lesion ?Complexity: simple   ?Destruction method: electrodesiccation and  curettage   ?Informed consent: discussed and consent obtained   ?Timeout:  patient name, date of birth, surgical site, and procedure verified ?Anesthesia: the lesion was anesthetized in a standard fashion   ?Anesthetic:  1% lidocaine w/ epinephrine 1-100,000 local infiltration ?Curettage performed in three different directions: Yes   ?Electrodesiccation performed over the curetted area: Yes   ?Curettage cycles:  1 ?Lesion length (cm):  1 ?Lesion width (cm):  0 ?Margin per side (cm):  0.1 ?Final wound size (cm):  1.2 ?Hemostasis achieved with:  aluminum chloride ?Outcome: patient tolerated procedure well with no complications   ?Post-procedure details: wound care instructions given   ? ?Specimen 1 - Surgical pathology ?Differential Diagnosis: bcc vs scc txpbx ? ?Check Margins: No ? ?After shave biopsy the base of the lesion was treated with curettage plus cautery ? ? ? ? ? ?I, Lavonna Monarch, MD, have reviewed all documentation for this visit.  The documentation on 11/09/21 for the exam, diagnosis, procedures, and orders are all accurate and complete. ?

## 2021-11-12 ENCOUNTER — Telehealth: Payer: Self-pay | Admitting: Internal Medicine

## 2021-11-12 DIAGNOSIS — G252 Other specified forms of tremor: Secondary | ICD-10-CM

## 2021-11-19 ENCOUNTER — Other Ambulatory Visit: Payer: Self-pay | Admitting: Internal Medicine

## 2021-11-19 DIAGNOSIS — G252 Other specified forms of tremor: Secondary | ICD-10-CM

## 2021-11-19 MED ORDER — PRIMIDONE 50 MG PO TABS: ORAL_TABLET | ORAL | 0 refills | Status: DC

## 2021-11-19 NOTE — Telephone Encounter (Signed)
Pt requesting a short supply until his next ov on 12-26-2021 ? ?Pt requesting rx sent to [id number] [place]?

## 2021-11-20 ENCOUNTER — Other Ambulatory Visit: Payer: Self-pay

## 2021-11-20 ENCOUNTER — Other Ambulatory Visit: Payer: Self-pay | Admitting: Internal Medicine

## 2021-11-20 DIAGNOSIS — G252 Other specified forms of tremor: Secondary | ICD-10-CM

## 2021-11-20 MED ORDER — PRIMIDONE 50 MG PO TABS: ORAL_TABLET | ORAL | 0 refills | Status: DC

## 2021-11-20 NOTE — Telephone Encounter (Signed)
Pt has been informed that Rx was sent on yesterday.  ?

## 2021-11-20 NOTE — Telephone Encounter (Signed)
Pt requested rx sent to [id number] [place]? ?Advised [facility], [facility] ?

## 2021-11-20 NOTE — Telephone Encounter (Signed)
Pt checking status of request ? ?Pt requesting a cb w/ status update ?

## 2021-12-26 ENCOUNTER — Encounter: Payer: Self-pay | Admitting: Internal Medicine

## 2021-12-26 ENCOUNTER — Ambulatory Visit (INDEPENDENT_AMBULATORY_CARE_PROVIDER_SITE_OTHER): Payer: PPO | Admitting: Internal Medicine

## 2021-12-26 VITALS — BP 128/74 | HR 55 | Temp 98.2°F | Resp 16 | Ht 66.0 in | Wt 175.0 lb

## 2021-12-26 DIAGNOSIS — I10 Essential (primary) hypertension: Secondary | ICD-10-CM

## 2021-12-26 DIAGNOSIS — E785 Hyperlipidemia, unspecified: Secondary | ICD-10-CM | POA: Diagnosis not present

## 2021-12-26 DIAGNOSIS — Z23 Encounter for immunization: Secondary | ICD-10-CM | POA: Diagnosis not present

## 2021-12-26 DIAGNOSIS — N1832 Chronic kidney disease, stage 3b: Secondary | ICD-10-CM

## 2021-12-26 LAB — CBC WITH DIFFERENTIAL/PLATELET
Basophils Absolute: 0.1 10*3/uL (ref 0.0–0.1)
Basophils Relative: 0.8 % (ref 0.0–3.0)
Eosinophils Absolute: 0.2 10*3/uL (ref 0.0–0.7)
Eosinophils Relative: 3.3 % (ref 0.0–5.0)
HCT: 41.7 % (ref 39.0–52.0)
Hemoglobin: 13.9 g/dL (ref 13.0–17.0)
Lymphocytes Relative: 34.8 % (ref 12.0–46.0)
Lymphs Abs: 2.6 10*3/uL (ref 0.7–4.0)
MCHC: 33.4 g/dL (ref 30.0–36.0)
MCV: 93.9 fl (ref 78.0–100.0)
Monocytes Absolute: 0.9 10*3/uL (ref 0.1–1.0)
Monocytes Relative: 11.2 % (ref 3.0–12.0)
Neutro Abs: 3.8 10*3/uL (ref 1.4–7.7)
Neutrophils Relative %: 49.9 % (ref 43.0–77.0)
Platelets: 218 10*3/uL (ref 150.0–400.0)
RBC: 4.44 Mil/uL (ref 4.22–5.81)
RDW: 13.9 % (ref 11.5–15.5)
WBC: 7.6 10*3/uL (ref 4.0–10.5)

## 2021-12-26 LAB — LIPID PANEL
Cholesterol: 123 mg/dL (ref 0–200)
HDL: 48.4 mg/dL (ref >39.00)
LDL Cholesterol: 51 mg/dL (ref 0–99)
NonHDL: 74.82
Total CHOL/HDL Ratio: 3
Triglycerides: 119 mg/dL (ref 0.0–149.0)
VLDL: 23.8 mg/dL (ref 0.0–40.0)

## 2021-12-26 LAB — URINALYSIS, ROUTINE W REFLEX MICROSCOPIC
Bilirubin Urine: NEGATIVE
Hgb urine dipstick: NEGATIVE
Ketones, ur: NEGATIVE
Leukocytes,Ua: NEGATIVE
Nitrite: NEGATIVE
Specific Gravity, Urine: 1.02 (ref 1.000–1.030)
Urine Glucose: NEGATIVE
Urobilinogen, UA: 0.2 (ref 0.0–1.0)
pH: 6 (ref 5.0–8.0)

## 2021-12-26 LAB — BASIC METABOLIC PANEL
BUN: 21 mg/dL (ref 6–23)
CO2: 30 mEq/L (ref 19–32)
Calcium: 9.9 mg/dL (ref 8.4–10.5)
Chloride: 104 mEq/L (ref 96–112)
Creatinine, Ser: 1.31 mg/dL (ref 0.40–1.50)
GFR: 50.41 mL/min — ABNORMAL LOW (ref >60.00)
Glucose, Bld: 98 mg/dL (ref 70–99)
Potassium: 4.9 mEq/L (ref 3.5–5.1)
Sodium: 139 mEq/L (ref 135–145)

## 2021-12-26 NOTE — Progress Notes (Signed)
? ?Subjective:  ?Patient ID: [name], male    DOB: 11/16/1940  Age: 81 y.o. MRN: [id number] ? ?CC: Hypertension ? ? ?HPI ?[name] presents for f/up - ? ?He is active and denies chest pain, shortness of breath, diaphoresis, dizziness, lightheadedness, or edema. ? ?Outpatient Medications Prior to Visit  ?Medication Sig Dispense Refill  ? amLODipine (NORVASC) 10 MG tablet TAKE ONE TABLET BY MOUTH EVERYDAY AT BEDTIME 30 tablet 11  ? aspirin EC 81 MG tablet Take 81 mg by mouth daily. Swallow whole.    ? atenolol (TENORMIN) 50 MG tablet TAKE ONE TABLET BY MOUTH EVERY MORNING and TAKE ONE TABLET BY MOUTH EVERYDAY AT BEDTIME 180 tablet 1  ? atorvastatin (LIPITOR) 20 MG tablet TAKE ONE TABLET BY MOUTH EVERYDAY AT BEDTIME 90 tablet 11  ? Coenzyme Q10 (CO Q 10 PO) Take 1 tablet by mouth daily.    ? hydrALAZINE (APRESOLINE) 25 MG tablet TAKE ONE TABLET BY MOUTH AT BREAKFAST AND AT BEDTIME 180 tablet 3  ? KRILL OIL PO Take 1 tablet by mouth daily.    ? lisinopril (ZESTRIL) 20 MG tablet TAKE ONE TABLET BY MOUTH EVERYDAY AT BEDTIME 90 tablet 3  ? Multiple Vitamins-Minerals (CENTRUM SILVER 50+MEN PO) Take 1 tablet by mouth.    ? nitroGLYCERIN (NITROSTAT) 0.4 MG SL tablet DISSOLVE 1 TABLET UNDER THE TONGUE EVERY 5 MINUTES AS NEEDED FOR CHEST PAIN. DO NOT EXCEED A TOTAL OF 3 DOSES IN 15 MINUTES. 25 tablet 1  ? pantoprazole (PROTONIX) 40 MG tablet TAKE ONE TABLET BY MOUTH EVERY MORNING 90 tablet 1  ? primidone (MYSOLINE) 50 MG tablet TAKE ONE TABLET BY MOUTH EVERY MORNING and TAKE ONE TABLET BY MOUTH EVERYDAY AT BEDTIME 180 tablet 0  ? tiZANidine (ZANAFLEX) 4 MG tablet Take 1 tablet (4 mg total) by mouth every 8 (eight) hours as needed for muscle spasms. 30 tablet 1  ? ?No facility-administered medications prior to visit.  ? ? ?ROS ?Review of Systems  ?Constitutional:  Negative for diaphoresis and fatigue.  ?HENT: Negative.    ?Eyes: Negative.   ?Respiratory:  Negative for cough, chest tightness, shortness of breath  and wheezing.   ?Cardiovascular:  Negative for chest pain, palpitations and leg swelling.  ?Gastrointestinal:  Negative for abdominal pain, constipation, diarrhea and nausea.  ?Endocrine: Negative.   ?Genitourinary: Negative.  Negative for difficulty urinating.  ?Musculoskeletal: Negative.  Negative for arthralgias and myalgias.  ?Neurological:  Positive for tremors. Negative for dizziness and light-headedness.  ?Hematological:  Negative for adenopathy. Does not bruise/bleed easily.  ?Psychiatric/Behavioral: Negative.    ? ?Objective:  ?BP 128/74 (BP Location: Right Arm, Patient Position: Sitting, Cuff Size: Large)   Pulse (!) 55   Temp 98.2 ?F (36.8 ?C) (Oral)   Resp 16   Ht '5\' 6"'$  (1.676 m)   Wt 175 lb (79.4 kg)   SpO2 96%   BMI 28.25 kg/m?  ? ?BP Readings from Last 3 Encounters:  ?12/26/21 128/74  ?06/17/21 128/76  ?03/20/21 116/70  ? ? ?Wt Readings from Last 3 Encounters:  ?12/26/21 175 lb (79.4 kg)  ?06/17/21 174 lb (78.9 kg)  ?03/20/21 175 lb 6.4 oz (79.6 kg)  ? ? ?Physical Exam ?Vitals reviewed.  ?HENT:  ?   Nose: Nose normal.  ?   Mouth/Throat:  ?   Mouth: Mucous membranes are moist.  ?Eyes:  ?   General: No scleral icterus. ?   Conjunctiva/sclera: Conjunctivae normal.  ?Cardiovascular:  ?   Rate and Rhythm:  Normal rate and regular rhythm.  ?   Heart sounds: No murmur heard. ?Pulmonary:  ?   Effort: Pulmonary effort is normal.  ?   Breath sounds: No stridor. No wheezing, rhonchi or rales.  ?Abdominal:  ?   General: Abdomen is flat.  ?   Palpations: There is no mass.  ?   Tenderness: There is no abdominal tenderness. There is no guarding.  ?   Hernia: No hernia is present.  ?Musculoskeletal:     ?   General: Normal range of motion.  ?   Cervical back: Neck supple.  ?   Right lower leg: No edema.  ?   Left lower leg: No edema.  ?Lymphadenopathy:  ?   Cervical: No cervical adenopathy.  ?Skin: ?   General: Skin is warm and dry.  ?Neurological:  ?   General: No focal deficit present.  ?   Mental Status: He  is alert.  ?Psychiatric:     ?   Mood and Affect: Mood normal.     ?   Behavior: Behavior normal.  ? ? ?Lab Results  ?Component Value Date  ? WBC 7.6 12/26/2021  ? HGB 13.9 12/26/2021  ? HCT 41.7 12/26/2021  ? PLT 218.0 12/26/2021  ? GLUCOSE 98 12/26/2021  ? CHOL 123 12/26/2021  ? TRIG 119.0 12/26/2021  ? HDL 48.40 12/26/2021  ? LDLCALC 51 12/26/2021  ? ALT 19 03/14/2021  ? AST 22 03/14/2021  ? NA 139 12/26/2021  ? K 4.9 12/26/2021  ? CL 104 12/26/2021  ? CREATININE 1.31 12/26/2021  ? BUN 21 12/26/2021  ? CO2 30 12/26/2021  ? TSH 1.13 03/14/2021  ? PSA 2.62 02/15/2018  ? ? ?EXERCISE TOLERANCE TEST (ETT) ? ?Result Date: 10/13/2018 ?? Blood pressure demonstrated a normal response to exercise. ? There was no ST segment deviation noted during stress. ? No chest pain noted with stress ? HR response was submaximal, reducing the sensitivity of this test to detect ischemia.   ? ? ?Assessment & Plan:  ? ?[name] was seen today for hypertension. ? ?Diagnoses and all orders for this visit: ? ?Essential hypertension- His blood pressure is adequately well controlled. ?-     Basic metabolic panel; Future ?-     CBC with Differential/Platelet; Future ?-     Urinalysis, Routine w reflex microscopic; Future ?-     Urinalysis, Routine w reflex microscopic ?-     CBC with Differential/Platelet ?-     Basic metabolic panel ? ?Stage 3b chronic kidney disease ([place])- His renal function is stable.  He is avoiding nephrotoxic agents. ?-     Basic metabolic panel; Future ?-     CBC with Differential/Platelet; Future ?-     Urinalysis, Routine w reflex microscopic; Future ?-     Urinalysis, Routine w reflex microscopic ?-     CBC with Differential/Platelet ?-     Basic metabolic panel ? ?Hyperlipidemia with target LDL less than 70- LDL goal achieved. Doing well on the statin  ?-     Lipid panel; Future ?-     Lipid panel ? ?Need for prophylactic vaccination and inoculation against varicella ?-     Zoster Vaccine Adjuvanted [facility]) injection;  Inject 0.5 mLs into the muscle once for 1 dose. ? ?Other orders ?-     Pneumococcal polysaccharide vaccine 23-valent greater than or equal to 2yo subcutaneous/IM ? ? ?I have discontinued [name] "Joe"'s tiZANidine. I am also having him start on Shingrix.  Additionally, I am having him maintain his Multiple Vitamins-Minerals (CENTRUM SILVER 50+MEN PO), Coenzyme Q10 (CO Q 10 PO), KRILL OIL PO, lisinopril, hydrALAZINE, aspirin EC, nitroGLYCERIN, amLODipine, atorvastatin, pantoprazole, atenolol, and primidone. ? ?Meds ordered this encounter  ?Medications  ? Zoster Vaccine Adjuvanted [facility]) injection  ?  Sig: Inject 0.5 mLs into the muscle once for 1 dose.  ?  Dispense:  0.5 mL  ?  Refill:  1  ? ? ? ?Follow-up: Return in about 6 months (around 06/28/2022). ? ?Scarlette Calico, MD ?

## 2021-12-26 NOTE — Patient Instructions (Signed)
Hypertension, Adult High blood pressure (hypertension) is when the force of blood pumping through the arteries is too strong. The arteries are the blood vessels that carry blood from the heart throughout the body. Hypertension forces the heart to work harder to pump blood and may cause arteries to become narrow or stiff. Untreated or uncontrolled hypertension can lead to a heart attack, heart failure, a stroke, kidney disease, and other problems. A blood pressure reading consists of a higher number over a lower number. Ideally, your blood pressure should be below 120/80. The first ("top") number is called the systolic pressure. It is a measure of the pressure in your arteries as your heart beats. The second ("bottom") number is called the diastolic pressure. It is a measure of the pressure in your arteries as the heart relaxes. What are the causes? The exact cause of this condition is not known. There are some conditions that result in high blood pressure. What increases the risk? Certain factors may make you more likely to develop high blood pressure. Some of these risk factors are under your control, including: Smoking. Not getting enough exercise or physical activity. Being overweight. Having too much fat, sugar, calories, or salt (sodium) in your diet. Drinking too much alcohol. Other risk factors include: Having a personal history of heart disease, diabetes, high cholesterol, or kidney disease. Stress. Having a family history of high blood pressure and high cholesterol. Having obstructive sleep apnea. Age. The risk increases with age. What are the signs or symptoms? High blood pressure may not cause symptoms. Very high blood pressure (hypertensive crisis) may cause: Headache. Fast or irregular heartbeats (palpitations). Shortness of breath. Nosebleed. Nausea and vomiting. Vision changes. Severe chest pain, dizziness, and seizures. How is this diagnosed? This condition is diagnosed by  measuring your blood pressure while you are seated, with your arm resting on a flat surface, your legs uncrossed, and your feet flat on the floor. The cuff of the blood pressure monitor will be placed directly against the skin of your upper arm at the level of your heart. Blood pressure should be measured at least twice using the same arm. Certain conditions can cause a difference in blood pressure between your right and left arms. If you have a high blood pressure reading during one visit or you have normal blood pressure with other risk factors, you may be asked to: Return on a different day to have your blood pressure checked again. Monitor your blood pressure at home for 1 week or longer. If you are diagnosed with hypertension, you may have other blood or imaging tests to help your health care provider understand your overall risk for other conditions. How is this treated? This condition is treated by making healthy lifestyle changes, such as eating healthy foods, exercising more, and reducing your alcohol intake. You may be referred for counseling on a healthy diet and physical activity. Your health care provider may prescribe medicine if lifestyle changes are not enough to get your blood pressure under control and if: Your systolic blood pressure is above 130. Your diastolic blood pressure is above 80. Your personal target blood pressure may vary depending on your medical conditions, your age, and other factors. Follow these instructions at home: Eating and drinking  Eat a diet that is high in fiber and potassium, and low in sodium, added sugar, and fat. An example of this eating plan is called the DASH diet. DASH stands for Dietary Approaches to Stop Hypertension. To eat this way: Eat   plenty of fresh fruits and vegetables. Try to fill one half of your plate at each meal with fruits and vegetables. Eat whole grains, such as whole-wheat pasta, brown rice, or whole-grain bread. Fill about one  fourth of your plate with whole grains. Eat or drink low-fat dairy products, such as skim milk or low-fat yogurt. Avoid fatty cuts of meat, processed or cured meats, and poultry with skin. Fill about one fourth of your plate with lean proteins, such as fish, chicken without skin, beans, eggs, or tofu. Avoid pre-made and processed foods. These tend to be higher in sodium, added sugar, and fat. Reduce your daily sodium intake. Many people with hypertension should eat less than 1,500 mg of sodium a day. Do not drink alcohol if: Your health care provider tells you not to drink. You are pregnant, may be pregnant, or are planning to become pregnant. If you drink alcohol: Limit how much you have to: 0-1 drink a day for women. 0-2 drinks a day for men. Know how much alcohol is in your drink. In the [place] one drink equals one 12 oz bottle of beer (355 mL), one 5 oz glass of wine (148 mL), or one 1 oz glass of hard liquor (44 mL). Lifestyle  Work with your health care provider to maintain a healthy body weight or to lose weight. Ask what an ideal weight is for you. Get at least 30 minutes of exercise that causes your heart to beat faster (aerobic exercise) most days of the week. Activities may include walking, swimming, or biking. Include exercise to strengthen your muscles (resistance exercise), such as Pilates or lifting weights, as part of your weekly exercise routine. Try to do these types of exercises for 30 minutes at least 3 days a week. Do not use any products that contain nicotine or tobacco. These products include cigarettes, chewing tobacco, and vaping devices, such as e-cigarettes. If you need help quitting, ask your health care provider. Monitor your blood pressure at home as told by your health care provider. Keep all follow-up visits. This is important. Medicines Take over-the-counter and prescription medicines only as told by your health care provider. Follow directions carefully. Blood  pressure medicines must be taken as prescribed. Do not skip doses of blood pressure medicine. Doing this puts you at risk for problems and can make the medicine less effective. Ask your health care provider about side effects or reactions to medicines that you should watch for. Contact a health care provider if you: Think you are having a reaction to a medicine you are taking. Have headaches that keep coming back (recurring). Feel dizzy. Have swelling in your ankles. Have trouble with your vision. Get help right away if you: Develop a severe headache or confusion. Have unusual weakness or numbness. Feel faint. Have severe pain in your chest or abdomen. Vomit repeatedly. Have trouble breathing. These symptoms may be an emergency. Get help right away. Call 911. Do not wait to see if the symptoms will go away. Do not drive yourself to the hospital. Summary Hypertension is when the force of blood pumping through your arteries is too strong. If this condition is not controlled, it may put you at risk for serious complications. Your personal target blood pressure may vary depending on your medical conditions, your age, and other factors. For most people, a normal blood pressure is less than 120/80. Hypertension is treated with lifestyle changes, medicines, or a combination of both. Lifestyle changes include losing weight, eating a healthy,   low-sodium diet, exercising more, and limiting alcohol. This information is not intended to replace advice given to you by your health care provider. Make sure you discuss any questions you have with your health care provider. Document Revised: 06/18/2021 Document Reviewed: 06/18/2021 Elsevier Patient Education  2023 Elsevier Inc.  

## 2021-12-27 MED ORDER — SHINGRIX 50 MCG/0.5ML IM SUSR: 0.5000 mL | Freq: Once | INTRAMUSCULAR | 1 refills | Status: AC

## 2021-12-30 ENCOUNTER — Telehealth: Payer: PPO

## 2022-01-31 ENCOUNTER — Other Ambulatory Visit: Payer: Self-pay | Admitting: Internal Medicine

## 2022-01-31 DIAGNOSIS — I10 Essential (primary) hypertension: Secondary | ICD-10-CM

## 2022-01-31 DIAGNOSIS — K2270 Barrett's esophagus without dysplasia: Secondary | ICD-10-CM

## 2022-01-31 DIAGNOSIS — K219 Gastro-esophageal reflux disease without esophagitis: Secondary | ICD-10-CM

## 2022-01-31 DIAGNOSIS — I2510 Atherosclerotic heart disease of native coronary artery without angina pectoris: Secondary | ICD-10-CM

## 2022-02-10 ENCOUNTER — Other Ambulatory Visit: Payer: Self-pay | Admitting: Internal Medicine

## 2022-02-10 DIAGNOSIS — I2510 Atherosclerotic heart disease of native coronary artery without angina pectoris: Secondary | ICD-10-CM

## 2022-02-15 ENCOUNTER — Other Ambulatory Visit: Payer: Self-pay | Admitting: Internal Medicine

## 2022-02-15 DIAGNOSIS — G252 Other specified forms of tremor: Secondary | ICD-10-CM

## 2022-02-19 DIAGNOSIS — M25511 Pain in right shoulder: Secondary | ICD-10-CM | POA: Diagnosis not present

## 2022-02-24 ENCOUNTER — Telehealth: Payer: PPO

## 2022-02-27 DIAGNOSIS — M25511 Pain in right shoulder: Secondary | ICD-10-CM | POA: Diagnosis not present

## 2022-03-18 ENCOUNTER — Encounter: Payer: Self-pay | Admitting: Internal Medicine

## 2022-03-18 ENCOUNTER — Ambulatory Visit (INDEPENDENT_AMBULATORY_CARE_PROVIDER_SITE_OTHER): Payer: PPO | Admitting: Internal Medicine

## 2022-03-18 VITALS — BP 116/70 | HR 62 | Temp 98.1°F | Ht 66.0 in | Wt 173.0 lb

## 2022-03-18 DIAGNOSIS — M150 Primary generalized (osteo)arthritis: Secondary | ICD-10-CM

## 2022-03-18 DIAGNOSIS — I10 Essential (primary) hypertension: Secondary | ICD-10-CM

## 2022-03-18 DIAGNOSIS — M159 Polyosteoarthritis, unspecified: Secondary | ICD-10-CM | POA: Diagnosis not present

## 2022-03-18 DIAGNOSIS — Z131 Encounter for screening for diabetes mellitus: Secondary | ICD-10-CM | POA: Insufficient documentation

## 2022-03-18 DIAGNOSIS — Z0001 Encounter for general adult medical examination with abnormal findings: Secondary | ICD-10-CM | POA: Diagnosis not present

## 2022-03-18 DIAGNOSIS — E785 Hyperlipidemia, unspecified: Secondary | ICD-10-CM

## 2022-03-18 DIAGNOSIS — N1832 Chronic kidney disease, stage 3b: Secondary | ICD-10-CM | POA: Diagnosis not present

## 2022-03-18 MED ORDER — CELECOXIB 50 MG PO CAPS: 50.0000 mg | ORAL_CAPSULE | Freq: Two times a day (BID) | ORAL | 1 refills | Status: DC

## 2022-03-18 NOTE — Progress Notes (Unsigned)
   Subjective:  Patient ID: [name], male    DOB: 06-24-1941  Age: 81 y.o. MRN: [id number]  CC: No chief complaint on file.   HPI [name] presents for ***  Outpatient Medications Prior to Visit  Medication Sig Dispense Refill   amLODipine (NORVASC) 10 MG tablet TAKE ONE TABLET BY MOUTH EVERYDAY AT BEDTIME 30 tablet 11   aspirin EC 81 MG tablet Take 81 mg by mouth daily. Swallow whole.     atenolol (TENORMIN) 50 MG tablet TAKE ONE TABLET BY MOUTH EVERY MORNING and TAKE ONE TABLET BY MOUTH EVERYDAY AT BEDTIME 180 tablet 1   atorvastatin (LIPITOR) 20 MG tablet TAKE ONE TABLET BY MOUTH EVERYDAY AT BEDTIME 90 tablet 11   Coenzyme Q10 (CO Q 10 PO) Take 1 tablet by mouth daily.     hydrALAZINE (APRESOLINE) 25 MG tablet TAKE ONE TABLET BY MOUTH AT BREAKFAST AND AT BEDTIME 180 tablet 3   KRILL OIL PO Take 1 tablet by mouth daily.     lisinopril (ZESTRIL) 20 MG tablet TAKE ONE TABLET BY MOUTH EVERYDAY AT BEDTIME 90 tablet 3   Multiple Vitamins-Minerals (CENTRUM SILVER 50+MEN PO) Take 1 tablet by mouth.     nitroGLYCERIN (NITROSTAT) 0.4 MG SL tablet DISSOLVE 1 TABLET UNDER THE TONGUE EVERY 5 MINUTES AS NEEDED FOR CHEST PAIN. DO NOT EXCEED A TOTAL OF 3 DOSES IN 15 MINUTES. 25 tablet 1   pantoprazole (PROTONIX) 40 MG tablet TAKE ONE TABLET BY MOUTH EVERY MORNING 90 tablet 1   primidone (MYSOLINE) 50 MG tablet TAKE ONE TABLET BY MOUTH EVERY MORNING AND TAKE ONE TABLET BY MOUTH EVERYDAY AT BEDTIME 180 tablet 0   No facility-administered medications prior to visit.    ROS Review of Systems  Objective:  There were no vitals taken for this visit.  BP Readings from Last 3 Encounters:  12/26/21 128/74  06/17/21 128/76  03/20/21 116/70    Wt Readings from Last 3 Encounters:  12/26/21 175 lb (79.4 kg)  06/17/21 174 lb (78.9 kg)  03/20/21 175 lb 6.4 oz (79.6 kg)    Physical Exam  Lab Results  Component Value Date   WBC 7.6 12/26/2021   HGB 13.9 12/26/2021   HCT 41.7  12/26/2021   PLT 218.0 12/26/2021   GLUCOSE 98 12/26/2021   CHOL 123 12/26/2021   TRIG 119.0 12/26/2021   HDL 48.40 12/26/2021   LDLCALC 51 12/26/2021   ALT 19 03/14/2021   AST 22 03/14/2021   NA 139 12/26/2021   K 4.9 12/26/2021   CL 104 12/26/2021   CREATININE 1.31 12/26/2021   BUN 21 12/26/2021   CO2 30 12/26/2021   TSH 1.13 03/14/2021   PSA 2.62 02/15/2018    EXERCISE TOLERANCE TEST (ETT)  Result Date: 10/13/2018  Blood pressure demonstrated a normal response to exercise.  There was no ST segment deviation noted during stress.  No chest pain noted with stress  HR response was submaximal, reducing the sensitivity of this test to detect ischemia.     Assessment & Plan:   There are no diagnoses linked to this encounter. I am having Steven Comes "Joe" maintain his Multiple Vitamins-Minerals (CENTRUM SILVER 50+MEN PO), Coenzyme Q10 (CO Q 10 PO), KRILL OIL PO, lisinopril, hydrALAZINE, aspirin EC, amLODipine, atorvastatin, pantoprazole, atenolol, nitroGLYCERIN, and primidone.  No orders of the defined types were placed in this encounter.    Follow-up: No follow-ups on file.  Scarlette Calico, MD

## 2022-03-18 NOTE — Patient Instructions (Signed)
Health Maintenance, Male Adopting a healthy lifestyle and getting preventive care are important in promoting health and wellness. Ask your health care provider about: The right schedule for you to have regular tests and exams. Things you can do on your own to prevent diseases and keep yourself healthy. What should I know about diet, weight, and exercise? Eat a healthy diet  Eat a diet that includes plenty of vegetables, fruits, low-fat dairy products, and lean protein. Do not eat a lot of foods that are high in solid fats, added sugars, or sodium. Maintain a healthy weight Body mass index (BMI) is a measurement that can be used to identify possible weight problems. It estimates body fat based on height and weight. Your health care provider can help determine your BMI and help you achieve or maintain a healthy weight. Get regular exercise Get regular exercise. This is one of the most important things you can do for your health. Most adults should: Exercise for at least 150 minutes each week. The exercise should increase your heart rate and make you sweat (moderate-intensity exercise). Do strengthening exercises at least twice a week. This is in addition to the moderate-intensity exercise. Spend less time sitting. Even light physical activity can be beneficial. Watch cholesterol and blood lipids Have your blood tested for lipids and cholesterol at 83 years of age, then have this test every 5 years. You may need to have your cholesterol levels checked more often if: Your lipid or cholesterol levels are high. You are older than 83 years of age. You are at high risk for heart disease. What should I know about cancer screening? Many types of cancers can be detected early and may often be prevented. Depending on your health history and family history, you may need to have cancer screening at various ages. This may include screening for: Colorectal cancer. Prostate cancer. Skin cancer. Lung  cancer. What should I know about heart disease, diabetes, and high blood pressure? Blood pressure and heart disease High blood pressure causes heart disease and increases the risk of stroke. This is more likely to develop in people who have high blood pressure readings or are overweight. Talk with your health care provider about your target blood pressure readings. Have your blood pressure checked: Every 3-5 years if you are 18-39 years of age. Every year if you are 40 years old or older. If you are between the ages of 65 and 75 and are a current or former smoker, ask your health care provider if you should have a one-time screening for abdominal aortic aneurysm (AAA). Diabetes Have regular diabetes screenings. This checks your fasting blood sugar level. Have the screening done: Once every three years after age 45 if you are at a normal weight and have a low risk for diabetes. More often and at a younger age if you are overweight or have a high risk for diabetes. What should I know about preventing infection? Hepatitis B If you have a higher risk for hepatitis B, you should be screened for this virus. Talk with your health care provider to find out if you are at risk for hepatitis B infection. Hepatitis C Blood testing is recommended for: Everyone born from 1945 through 1965. Anyone with known risk factors for hepatitis C. Sexually transmitted infections (STIs) You should be screened each year for STIs, including gonorrhea and chlamydia, if: You are sexually active and are younger than 83 years of age. You are older than 83 years of age and your   health care provider tells you that you are at risk for this type of infection. Your sexual activity has changed since you were last screened, and you are at increased risk for chlamydia or gonorrhea. Ask your health care provider if you are at risk. Ask your health care provider about whether you are at high risk for HIV. Your health care provider  may recommend a prescription medicine to help prevent HIV infection. If you choose to take medicine to prevent HIV, you should first get tested for HIV. You should then be tested every 3 months for as long as you are taking the medicine. Follow these instructions at home: Alcohol use Do not drink alcohol if your health care provider tells you not to drink. If you drink alcohol: Limit how much you have to 0-2 drinks a day. Know how much alcohol is in your drink. In the [place] one drink equals one 12 oz bottle of beer (355 mL), one 5 oz glass of wine (148 mL), or one 1 oz glass of hard liquor (44 mL). Lifestyle Do not use any products that contain nicotine or tobacco. These products include cigarettes, chewing tobacco, and vaping devices, such as e-cigarettes. If you need help quitting, ask your health care provider. Do not use street drugs. Do not share needles. Ask your health care provider for help if you need support or information about quitting drugs. General instructions Schedule regular health, dental, and eye exams. Stay current with your vaccines. Tell your health care provider if: You often feel depressed. You have ever been abused or do not feel safe at home. Summary Adopting a healthy lifestyle and getting preventive care are important in promoting health and wellness. Follow your health care provider's instructions about healthy diet, exercising, and getting tested or screened for diseases. Follow your health care provider's instructions on monitoring your cholesterol and blood pressure. This information is not intended to replace advice given to you by your health care provider. Make sure you discuss any questions you have with your health care provider. Document Revised: 12/31/2020 Document Reviewed: 12/31/2020 Elsevier Patient Education  2023 [employer] Inc.  

## 2022-03-21 ENCOUNTER — Ambulatory Visit (INDEPENDENT_AMBULATORY_CARE_PROVIDER_SITE_OTHER): Payer: PPO

## 2022-03-21 DIAGNOSIS — Z0000 Encounter for general adult medical examination without abnormal findings: Secondary | ICD-10-CM

## 2022-03-21 NOTE — Progress Notes (Signed)
I connected with [name] today by telephone and verified that I am speaking with the correct person using two identifiers. Location patient: home Location provider: work Persons participating in the virtual visit: patient, provider.   I discussed the limitations, risks, security and privacy concerns of performing an evaluation and management service by telephone and the availability of in person appointments. I also discussed with the patient that there may be a patient responsible charge related to this service. The patient expressed understanding and verbally consented to this telephonic visit.    Interactive audio and video telecommunications were attempted between this provider and patient, however failed, due to patient having technical difficulties OR patient did not have access to video capability.  We continued and completed visit with audio only.  Some vital signs may be absent or patient reported.   Time Spent with patient on telephone encounter: 30 minutes  Subjective:   [name] is a 81 y.o. male who presents for Medicare Annual/Subsequent preventive examination.  Review of Systems     Cardiac Risk Factors include: advanced age (>61mn, >>92women);dyslipidemia;family history of premature cardiovascular disease;hypertension;male gender     Objective:    There were no vitals filed for this visit. There is no height or weight on file to calculate BMI.     03/21/2022    9:50 AM 03/20/2021    2:10 PM 10/18/2019   10:35 AM 04/14/2019   12:03 PM 11/16/2017   11:48 PM 03/06/2017    5:49 PM 03/28/2016   10:59 AM  Advanced Directives  Does Patient Have a Medical Advance Directive? Yes Yes Yes Yes No Yes Yes  Type of Advance Directive Living will;Healthcare Power of Attorney Living will;Healthcare Power of [place] will  [place] will [place] will  Does patient want to make  changes to medical advance directive? No - Patient declined No - Patient declined       Copy of [place] Chart? No - copy requested No - copy requested  No - copy requested  Yes No - copy requested  Would patient like information on creating a medical advance directive?     No - Patient declined      Current Medications (verified) Outpatient Encounter Medications as of 03/21/2022  Medication Sig   amLODipine (NORVASC) 10 MG tablet TAKE ONE TABLET BY MOUTH EVERYDAY AT BEDTIME   aspirin EC 81 MG tablet Take 81 mg by mouth daily. Swallow whole.   atenolol (TENORMIN) 50 MG tablet TAKE ONE TABLET BY MOUTH EVERY MORNING and TAKE ONE TABLET BY MOUTH EVERYDAY AT BEDTIME   atorvastatin (LIPITOR) 20 MG tablet TAKE ONE TABLET BY MOUTH EVERYDAY AT BEDTIME   celecoxib (CELEBREX) 50 MG capsule Take 1 capsule (50 mg total) by mouth 2 (two) times daily.   Coenzyme Q10 (CO Q 10 PO) Take 1 tablet by mouth daily.   hydrALAZINE (APRESOLINE) 25 MG tablet TAKE ONE TABLET BY MOUTH AT BREAKFAST AND AT BEDTIME   KRILL OIL PO Take 1 tablet by mouth daily.   lisinopril (ZESTRIL) 20 MG tablet TAKE ONE TABLET BY MOUTH EVERYDAY AT BEDTIME   Multiple Vitamins-Minerals (CENTRUM SILVER 50+MEN PO) Take 1 tablet by mouth.   nitroGLYCERIN (NITROSTAT) 0.4 MG SL tablet DISSOLVE 1 TABLET UNDER THE TONGUE EVERY 5 MINUTES AS NEEDED FOR CHEST PAIN. DO NOT EXCEED A TOTAL OF 3 DOSES IN 15 MINUTES.   pantoprazole (PROTONIX) 40 MG tablet  TAKE ONE TABLET BY MOUTH EVERY MORNING   primidone (MYSOLINE) 50 MG tablet TAKE ONE TABLET BY MOUTH EVERY MORNING AND TAKE ONE TABLET BY MOUTH EVERYDAY AT BEDTIME   No facility-administered encounter medications on file as of 03/21/2022.    Allergies (verified) Patient has no known allergies.   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 - decreased dose of HCTZ to M,F   Diverticulosis    DJD (degenerative joint disease)    Duodenal ulcer    ED (erectile dysfunction)    GERD (gastroesophageal reflux disease)    increased s/s in the last week 03-22-19 PV    Hemorrhoids    Hyperlipidemia    Hypertension    Myocardial infarction ([place])    2002   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   mid frontal scalp-cx69f   SCC (squamous cell carcinoma) 06/15/2019   front middle scalp-tx cx352f  SCC (squamous cell carcinoma) 12/27/2002   CIS-right angle of jaw--[id number] SCC (squamous cell carcinoma) 09/24/2004   CIS--mid post crown, left mis crown medial,left first web space-tx [id number] (squamous cell carcinoma) 06/03/2006   SCCA-right temple-tx [id number] (squamous cell carcinoma) 06/03/2006   CIS-right forehead, left mis crown scalp medial-tx cx35fu 37fC (squamous cell carcinoma) 07/30/2006   right temple lateral-txpbx   SCC (squamous cell carcinoma) 11/10/2006   left forehead, left hand proximal   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/02/2009   left scalp-cx35fu  57f (squamous cell carcinoma) 01/24/2015   left scalp-cx35fu   30f(squamous cell carcinoma) 11/12/2015   front lateralscalp-cx35fu, me6f scalp-cx35fu   SC59fquamous cell carcinoma) 12/17/2016   front center scalp-cx35fu   Squ73fs cell carcinoma of skin 08/10/2017   CIS--right ear rim, top of scalp, left side scalp-tx-cx35fu   Past76fgical History:  Procedure Laterality Date   COLONOSCOPY     ELBOW ARTHROSCOPY     heart stents     2002,2004   SPINAL FUSION     SPINAL FUSION     SPINE SURGERY     cervical   UPPER GASTROINTESTINAL ENDOSCOPY     Family History  Problem Relation Age of Onset   Alcohol abuse Other    Coronary artery disease Other    Alzheimer's disease Other     Dementia Mother    Colon cancer Neg Hx    Esophageal cancer Neg Hx    Rectal cancer Neg Hx    Stomach cancer Neg Hx    Colon polyps Neg Hx    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  Tobacco Use   Smoking status: Former    Types: Cigarettes    Quit date: 08/25/1961    Years since quitting: 60.6    Passive exposure: Never   Smokeless tobacco: Never  Vaping  Use   Vaping Use: Never used  Substance and Sexual Activity   Alcohol use: Yes    Comment: rarely   Drug use: No   Sexual activity: Not Currently  Other Topics Concern   Not on file  Social History Narrative   Right handed   1 Story home    live spouse   Social Determinants of Health   Financial Resource Strain: Low Risk  (03/21/2022)   Overall Financial Resource Strain (CARDIA)    Difficulty of Paying Living Expenses: Not hard at all  Food Insecurity: No Food Insecurity (03/21/2022)   Hunger Vital Sign    Worried About Running Out of Food in the Last Year: Never true    Ran Out of Food in the Last Year: Never true  Transportation Needs: No Transportation Needs (03/21/2022)   PRAPARE - Hydrologist (Medical): No    Lack of Transportation (Non-Medical): No  Physical Activity: Sufficiently Active (03/21/2022)   Exercise Vital Sign    Days of Exercise per Week: 5 days    Minutes of Exercise per Session: 30 min  Stress: No Stress Concern Present (03/21/2022)   [place]    Feeling of Stress : Not at all  Social Connections: Unknown (03/21/2022)   Social Connection and Isolation Panel [NHANES]    Frequency of Communication with Friends and Family: More than three times a week    Frequency of Social  Gatherings with Friends and Family: More than three times a week    Attends Religious Services: Not on Advertising copywriter or Organizations: Yes    Attends Music therapist: More than 4 times per year    Marital Status: Married    Tobacco Counseling Counseling given: Not Answered   Clinical Intake:  Pre-visit preparation completed: Yes  Pain : No/denies pain     Nutritional Risks: None  How often do you need to have someone help you when you read instructions, pamphlets, or other written materials from your doctor or pharmacy?: 1 - Never What is the last grade level you completed in school?: Bachelor's Degree  Diabetic? no  Interpreter Needed?: No  Information entered by :: Lisette Abu, LPN.   Activities of Daily Living    03/21/2022   10:05 AM  In your present state of health, do you have any difficulty performing the following activities:  Hearing? 0  Vision? 0  Difficulty concentrating or making decisions? 0  Walking or climbing stairs? 0  Dressing or bathing? 0  Doing errands, shopping? 0  Preparing Food and eating ? N  Using the Toilet? N  In the past six months, have you accidently leaked urine? N  Do you have problems with loss of bowel control? N  Managing your Medications? N  Managing your Finances? N  Housekeeping or managing your Housekeeping? N    Patient Care Team: [name], MD as PCP - General (Internal Medicine) Minus Breeding, MD as PCP - Cardiology (Cardiology) Lavonna Monarch, MD as Consulting Physician (Dermatology) Tat, Eustace Quail, DO as Consulting Physician (Neurology) Charlton Haws, [facility] as Pharmacist (Pharmacist) Juluis Rainier as Consulting Physician (Optometry)  Indicate any recent Medical Services you may have received from other than [facility] providers in the past year (date may be approximate).     Assessment:   This is a routine wellness examination for Braeden.  Hearing/Vision  screen Hearing Screening -  Comments:: Patient denied any hearing difficulty.   No hearing aids.  Vision Screening - Comments:: Patient does wear corrective lenses/contacts.  Eye exam done by: Alois Cliche, OD.   Dietary issues and exercise activities discussed: Current Exercise Habits: Home exercise routine, Time (Minutes): 30, Frequency (Times/Week): 5, Weekly Exercise (Minutes/Week): 150, Intensity: Moderate, Exercise limited by: orthopedic condition(s)   Goals Addressed             This Visit's Progress    Get back into the gym .        Depression Screen    03/21/2022    9:53 AM 03/20/2021    2:09 PM 03/14/2021    9:55 AM 10/04/2020    1:32 PM 04/14/2019   12:04 PM 03/10/2019    4:27 PM 03/06/2017    5:50 PM  PHQ 2/9 Scores  PHQ - 2 Score 0 0 0 0 0 0 0    Fall Risk    03/21/2022    9:51 AM 03/20/2021    2:12 PM 03/14/2021    9:55 AM 11/26/2020   10:32 AM 11/07/2020    9:09 AM  Fall Risk   Falls in the past year? 0 0 0 0 0  Number falls in past yr: 0 0 0 0   Injury with Fall? 0 0 0 0   Risk for fall due to : No Fall Risks No Fall Risks     Follow up Falls evaluation completed Falls evaluation completed  Falls evaluation completed     [place]:  Any stairs in or around the home? No  If so, are there any without handrails? No  Home free of loose throw rugs in walkways, pet beds, electrical cords, etc? Yes  Adequate lighting in your home to reduce risk of falls? Yes   ASSISTIVE DEVICES UTILIZED TO PREVENT FALLS:  Life alert? No  Use of a cane, walker or w/c? No  Grab bars in the bathroom? No  Shower chair or bench in shower? No  Elevated toilet seat or a handicapped toilet? No   TIMED UP AND GO:  Was the test performed? No .  Length of time to ambulate 10 feet: n/a sec.   Appearance of gait: Gait not evaluated during this visit.  Cognitive Function:        03/21/2022   10:05 AM  6CIT Screen  What Year? 0 points  What  month? 0 points  What time? 0 points  Count back from 20 0 points  Months in reverse 0 points  Repeat phrase 0 points  Total Score 0 points    Immunizations Immunization History  Administered Date(s) Administered   Fluad Quad(high Dose 65+) 05/07/2019, 06/14/2020   H1N1 09/22/2008   Influenza Split 05/18/2012   Influenza Whole 08/25/2005, 06/08/2008, 07/04/2009, 06/24/2010   Influenza, High Dose Seasonal PF 06/21/2015, 05/08/2016, 06/27/2017, 06/14/2018   Influenza,inj,Quad PF,6+ Mos 07/20/2013, 06/23/2014   PFIZER(Purple Top)SARS-COV-2 Vaccination 09/05/2019, 09/25/2019, 07/03/2020   Pfizer Covid-19 Vaccine Bivalent Booster 66yr & up 05/09/2021   Pneumococcal Conjugate-13 01/18/2014   Pneumococcal Polysaccharide-23 08/25/2002, 09/21/2007, 02/06/2016, 12/26/2021   Td 08/25/2006   Tdap 09/22/2016   Zoster Recombinat (Shingrix) 09/19/2021, 03/16/2022   Zoster, Live 10/20/2008    TDAP status: Up to date  Flu Vaccine status: Up to date  Pneumococcal vaccine status: Up to date  Covid-19 vaccine status: Completed vaccines  Qualifies for Shingles Vaccine? Yes   Zostavax completed Yes   Shingrix Completed?: Yes  Screening Tests Health Maintenance  Topic Date Due   INFLUENZA VACCINE  03/25/2022   TETANUS/TDAP  09/22/2026   Pneumonia Vaccine 31+ Years old  Completed   COVID-19 Vaccine  Completed   Zoster Vaccines- Shingrix  Completed   HPV VACCINES  Aged Out    Health Maintenance  There are no preventive care reminders to display for this patient.   Colorectal cancer screening: No longer required.   Lung Cancer Screening: (Low Dose CT Chest recommended if Age 35-80 years, 30 pack-year currently smoking OR have quit w/in 15years.) does not qualify.   Lung Cancer Screening Referral: no  Additional Screening:  Hepatitis C Screening: does not qualify; Completed no  Vision Screening: Recommended annual ophthalmology exams for early detection of glaucoma and other  disorders of the eye. Is the patient up to date with their annual eye exam?  Yes  Who is the provider or what is the name of the office in which the patient attends annual eye exams? Alois Cliche, OD. If pt is not established with a provider, would they like to be referred to a provider to establish care? No .   Dental Screening: Recommended annual dental exams for proper oral hygiene  Community Resource Referral / Chronic Care Management: CRR required this visit?  No   CCM required this visit?  No      Plan:     I have personally reviewed and noted the following in the patient's chart:   Medical and social history Use of alcohol, tobacco or illicit drugs  Current medications and supplements including opioid prescriptions. Patient is not currently taking opioid prescriptions. Functional ability and status Nutritional status Physical activity Advanced directives List of other physicians Hospitalizations, surgeries, and ER visits in previous 12 months Vitals Screenings to include cognitive, depression, and falls Referrals and appointments  In addition, I have reviewed and discussed with patient certain preventive protocols, quality metrics, and best practice recommendations. A written personalized care plan for preventive services as well as general preventive health recommendations were provided to patient.     Sheral Flow, LPN   04/05/7516   Nurse Notes:  Patient is cogitatively intact. There were no vitals filed for this visit. There is no height or weight on file to calculate BMI. Patient stated that he has no issues with gait or balance; does not use any assistive devices. Medications reviewed with patient; no opioid use noted.

## 2022-03-21 NOTE — Patient Instructions (Signed)
[name] , Thank you for taking time to come for your Medicare Wellness Visit. I appreciate your ongoing commitment to your health goals. Please review the following plan we discussed and let me know if I can assist you in the future.   Screening recommendations/referrals: Colonoscopy: Discontinued due to age Recommended yearly ophthalmology/optometry visit for glaucoma screening and checkup Recommended yearly dental visit for hygiene and checkup  Vaccinations: Influenza vaccine: 05/2021 Pneumococcal vaccine: 01/18/2014, 12/26/2021 Tdap vaccine: 09/22/2016; due every 10 years Shingles vaccine: 09/19/2021, 02/2022   Covid-19: 09/05/2019, 09/25/2019, 07/03/2020, 05/09/2021  Advanced directives: Yes; Please bring a copy of your health care power of attorney and living will to the office at your convenience.  Conditions/risks identified: Yes  Next appointment: Please schedule your next Medicare Wellness Visit with your Nurse Health Advisor in 1 year by calling [phone].  Preventive Care 70 Years and Older, Male Preventive care refers to lifestyle choices and visits with your health care provider that can promote health and wellness. What does preventive care include? A yearly physical exam. This is also called an annual well check. Dental exams once or twice a year. Routine eye exams. Ask your health care provider how often you should have your eyes checked. Personal lifestyle choices, including: Daily care of your teeth and gums. Regular physical activity. Eating a healthy diet. Avoiding tobacco and drug use. Limiting alcohol use. Practicing safe sex. Taking low doses of aspirin every day. Taking vitamin and mineral supplements as recommended by your health care provider. What happens during an annual well check? The services and screenings done by your health care provider during your annual well check will depend on your age, overall health, lifestyle risk factors, and family history  of disease. Counseling  Your health care provider may ask you questions about your: Alcohol use. Tobacco use. Drug use. Emotional well-being. Home and relationship well-being. Sexual activity. Eating habits. History of falls. Memory and ability to understand (cognition). Work and work Statistician. Screening  You may have the following tests or measurements: Height, weight, and BMI. Blood pressure. Lipid and cholesterol levels. These may be checked every 5 years, or more frequently if you are over 40 years old. Skin check. Lung cancer screening. You may have this screening every year starting at age 21 if you have a 30-pack-year history of smoking and currently smoke or have quit within the past 15 years. Fecal occult blood test (FOBT) of the stool. You may have this test every year starting at age 51. Flexible sigmoidoscopy or colonoscopy. You may have a sigmoidoscopy every 5 years or a colonoscopy every 10 years starting at age 14. Prostate cancer screening. Recommendations will vary depending on your family history and other risks. Hepatitis C blood test. Hepatitis B blood test. Sexually transmitted disease (STD) testing. Diabetes screening. This is done by checking your blood sugar (glucose) after you have not eaten for a while (fasting). You may have this done every 1-3 years. Abdominal aortic aneurysm (AAA) screening. You may need this if you are a current or former smoker. Osteoporosis. You may be screened starting at age 59 if you are at high risk. Talk with your health care provider about your test results, treatment options, and if necessary, the need for more tests. Vaccines  Your health care provider may recommend certain vaccines, such as: Influenza vaccine. This is recommended every year. Tetanus, diphtheria, and acellular pertussis (Tdap, Td) vaccine. You may need a Td booster every 10 years. Zoster vaccine. You may need  this after age 54. Pneumococcal 13-valent  conjugate (PCV13) vaccine. One dose is recommended after age 48. Pneumococcal polysaccharide (PPSV23) vaccine. One dose is recommended after age 54. Talk to your health care provider about which screenings and vaccines you need and how often you need them. This information is not intended to replace advice given to you by your health care provider. Make sure you discuss any questions you have with your health care provider. Document Released: 09/07/2015 Document Revised: 04/30/2016 Document Reviewed: 06/12/2015 Elsevier Interactive Patient Education  2017 [employer] Prevention in the Home Falls can cause injuries. They can happen to people of all ages. There are many things you can do to make your home safe and to help prevent falls. What can I do on the outside of my home? Regularly fix the edges of walkways and driveways and fix any cracks. Remove anything that might make you trip as you walk through a door, such as a raised step or threshold. Trim any bushes or trees on the path to your home. Use bright outdoor lighting. Clear any walking paths of anything that might make someone trip, such as rocks or tools. Regularly check to see if handrails are loose or broken. Make sure that both sides of any steps have handrails. Any raised decks and porches should have guardrails on the edges. Have any leaves, snow, or ice cleared regularly. Use sand or salt on walking paths during winter. Clean up any spills in your garage right away. This includes oil or grease spills. What can I do in the bathroom? Use night lights. Install grab bars by the toilet and in the tub and shower. Do not use towel bars as grab bars. Use non-skid mats or decals in the tub or shower. If you need to sit down in the shower, use a plastic, non-slip stool. Keep the floor dry. Clean up any water that spills on the floor as soon as it happens. Remove soap buildup in the tub or shower regularly. Attach bath mats  securely with double-sided non-slip rug tape. Do not have throw rugs and other things on the floor that can make you trip. What can I do in the bedroom? Use night lights. Make sure that you have a light by your bed that is easy to reach. Do not use any sheets or blankets that are too big for your bed. They should not hang down onto the floor. Have a firm chair that has side arms. You can use this for support while you get dressed. Do not have throw rugs and other things on the floor that can make you trip. What can I do in the kitchen? Clean up any spills right away. Avoid walking on wet floors. Keep items that you use a lot in easy-to-reach places. If you need to reach something above you, use a strong step stool that has a grab bar. Keep electrical cords out of the way. Do not use floor polish or wax that makes floors slippery. If you must use wax, use non-skid floor wax. Do not have throw rugs and other things on the floor that can make you trip. What can I do with my stairs? Do not leave any items on the stairs. Make sure that there are handrails on both sides of the stairs and use them. Fix handrails that are broken or loose. Make sure that handrails are as long as the stairways. Check any carpeting to make sure that it is firmly attached to  the stairs. Fix any carpet that is loose or worn. Avoid having throw rugs at the top or bottom of the stairs. If you do have throw rugs, attach them to the floor with carpet tape. Make sure that you have a light switch at the top of the stairs and the bottom of the stairs. If you do not have them, ask someone to add them for you. What else can I do to help prevent falls? Wear shoes that: Do not have high heels. Have rubber bottoms. Are comfortable and fit you well. Are closed at the toe. Do not wear sandals. If you use a stepladder: Make sure that it is fully opened. Do not climb a closed stepladder. Make sure that both sides of the stepladder  are locked into place. Ask someone to hold it for you, if possible. Clearly mark and make sure that you can see: Any grab bars or handrails. First and last steps. Where the edge of each step is. Use tools that help you move around (mobility aids) if they are needed. These include: Canes. Walkers. Scooters. Crutches. Turn on the lights when you go into a dark area. Replace any light bulbs as soon as they burn out. Set up your furniture so you have a clear path. Avoid moving your furniture around. If any of your floors are uneven, fix them. If there are any pets around you, be aware of where they are. Review your medicines with your doctor. Some medicines can make you feel dizzy. This can increase your chance of falling. Ask your doctor what other things that you can do to help prevent falls. This information is not intended to replace advice given to you by your health care provider. Make sure you discuss any questions you have with your health care provider. Document Released: 06/07/2009 Document Revised: 01/17/2016 Document Reviewed: 09/15/2014 Elsevier Interactive Patient Education  2017 [employer].

## 2022-04-03 ENCOUNTER — Telehealth: Payer: PPO

## 2022-05-01 ENCOUNTER — Other Ambulatory Visit: Payer: Self-pay | Admitting: Internal Medicine

## 2022-05-01 ENCOUNTER — Encounter: Payer: Self-pay | Admitting: Internal Medicine

## 2022-05-01 ENCOUNTER — Other Ambulatory Visit: Payer: Self-pay | Admitting: Cardiology

## 2022-05-07 ENCOUNTER — Encounter: Payer: Self-pay | Admitting: Gastroenterology

## 2022-05-08 DIAGNOSIS — D2271 Melanocytic nevi of right lower limb, including hip: Secondary | ICD-10-CM | POA: Diagnosis not present

## 2022-05-08 DIAGNOSIS — D2262 Melanocytic nevi of left upper limb, including shoulder: Secondary | ICD-10-CM | POA: Diagnosis not present

## 2022-05-08 DIAGNOSIS — D2261 Melanocytic nevi of right upper limb, including shoulder: Secondary | ICD-10-CM | POA: Diagnosis not present

## 2022-05-08 DIAGNOSIS — X32XXXS Exposure to sunlight, sequela: Secondary | ICD-10-CM | POA: Diagnosis not present

## 2022-05-08 DIAGNOSIS — L821 Other seborrheic keratosis: Secondary | ICD-10-CM | POA: Diagnosis not present

## 2022-05-08 DIAGNOSIS — L814 Other melanin hyperpigmentation: Secondary | ICD-10-CM | POA: Diagnosis not present

## 2022-05-08 DIAGNOSIS — D225 Melanocytic nevi of trunk: Secondary | ICD-10-CM | POA: Diagnosis not present

## 2022-05-08 DIAGNOSIS — L570 Actinic keratosis: Secondary | ICD-10-CM | POA: Diagnosis not present

## 2022-05-08 DIAGNOSIS — D2272 Melanocytic nevi of left lower limb, including hip: Secondary | ICD-10-CM | POA: Diagnosis not present

## 2022-05-08 DIAGNOSIS — D1801 Hemangioma of skin and subcutaneous tissue: Secondary | ICD-10-CM | POA: Diagnosis not present

## 2022-05-13 ENCOUNTER — Telehealth: Payer: Self-pay | Admitting: Cardiology

## 2022-05-13 MED ORDER — HYDRALAZINE HCL 25 MG PO TABS: ORAL_TABLET | ORAL | 0 refills | Status: DC

## 2022-05-13 NOTE — Telephone Encounter (Signed)
*  STAT* If patient is at the pharmacy, call can be transferred to refill team.   1. Which medications need to be refilled? (please list name of each medication and dose if known)   hydrALAZINE (APRESOLINE) 25 MG tablet    2. Which pharmacy/location (including street and city if local pharmacy) is medication to be sent to? [facility] - [place], [place] - [place] [address]  3. Do they need a 30 day or 90 day supply?  90 day   Pt has scheduled appt on 06/20/22. Please advise

## 2022-05-13 NOTE — Addendum Note (Signed)
Addended by: Lubertha Sayres on: 05/13/2022 04:42 PM   Modules accepted: Orders

## 2022-05-15 DIAGNOSIS — N5201 Erectile dysfunction due to arterial insufficiency: Secondary | ICD-10-CM | POA: Diagnosis not present

## 2022-05-15 DIAGNOSIS — N401 Enlarged prostate with lower urinary tract symptoms: Secondary | ICD-10-CM | POA: Diagnosis not present

## 2022-05-15 DIAGNOSIS — R351 Nocturia: Secondary | ICD-10-CM | POA: Diagnosis not present

## 2022-05-18 NOTE — Progress Notes (Signed)
Cardiology Office Note   Date:  05/19/2022   ID:  [name], [name] [birth year], MRN [id number]  PCP:  [name], MD  Cardiologist:   Minus Breeding, MD   Chief Complaint  Patient presents with   Coronary Artery Disease      History of Present Illness: [name] is a 81 y.o. male who presents for follow up of his CAD.  He returns for yearly follow up.  Since I last saw him his been in the Hypertensive Clinic and his blood pressures have been controlled.  The patient denies any new symptoms such as chest discomfort, neck or arm discomfort. There has been no new shortness of breath, PND or orthopnea. There have been no reported palpitations, presyncope or syncope.  He is going to [employer] to a conference and will be doing some walking.     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 - decreased dose of HCTZ to M,F   Diverticulosis    DJD (degenerative joint disease)    Duodenal ulcer    ED (erectile dysfunction)    GERD (gastroesophageal reflux disease)    increased s/s in the last week 03-22-19 PV    Hemorrhoids    Hyperlipidemia    Hypertension    Myocardial infarction ([place])    2002   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   mid frontal scalp-cx43f   SCC (squamous cell carcinoma) 06/15/2019   front middle scalp-tx cx348f  SCC (squamous cell carcinoma) 12/27/2002   CIS-right angle of jaw--[id number] SCC (squamous cell carcinoma) 09/24/2004   CIS--mid post crown, left mis crown medial,left first web space-tx [id number] (squamous cell carcinoma) 06/03/2006   SCCA-right temple-tx cx35fu4fCC (squamous cell carcinoma) 06/03/2006   CIS-right forehead,  left mis crown scalp medial-tx cx35fu 23fC (squamous cell carcinoma) 07/30/2006   right temple lateral-txpbx   SCC (squamous cell carcinoma) 11/10/2006   left forehead, left hand proximal   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/02/2009   left scalp-cx35fu  32f (squamous cell carcinoma) 01/24/2015   left scalp-cx35fu   82f(squamous cell carcinoma) 11/12/2015   front lateralscalp-cx35fu, me71f scalp-cx35fu   SC39fquamous cell carcinoma) 12/17/2016   front center scalp-cx35fu   Squ37fs cell carcinoma of skin 08/10/2017   CIS--right ear rim, top of scalp, left side scalp-tx-cx35fu    Pas69frgical History:  Procedure Laterality Date   COLONOSCOPY     ELBOW ARTHROSCOPY     heart stents     2002,2004   SPINAL FUSION     SPINAL FUSION     SPINE SURGERY     cervical   UPPER GASTROINTESTINAL ENDOSCOPY       Current Outpatient Medications  Medication Sig Dispense Refill   aspirin EC 81 MG tablet Take 81 mg by mouth daily. Swallow whole.     Coenzyme Q10 (CO Q 10 PO) Take 1 tablet by mouth daily.     KRILL OIL PO Take 1 tablet by mouth daily.     Multiple Vitamins-Minerals (CENTRUM  SILVER 50+MEN PO) Take 1 tablet by mouth.     pantoprazole (PROTONIX) 40 MG tablet TAKE ONE TABLET BY MOUTH EVERY MORNING 90 tablet 1   primidone (MYSOLINE) 50 MG tablet TAKE ONE TABLET BY MOUTH EVERY MORNING AND TAKE ONE TABLET BY MOUTH EVERYDAY AT BEDTIME 180 tablet 0   amLODipine (NORVASC) 10 MG tablet TAKE ONE TABLET BY MOUTH EVERYDAY AT BEDTIME 90 tablet 3   atenolol (TENORMIN) 50 MG tablet TAKE ONE TABLET BY MOUTH EVERY MORNING and TAKE ONE TABLET BY MOUTH EVERYDAY AT BEDTIME 180 tablet 3   atorvastatin (LIPITOR) 20 MG tablet TAKE ONE TABLET BY MOUTH EVERYDAY AT BEDTIME 90 tablet 3   hydrALAZINE (APRESOLINE) 25 MG tablet TAKE ONE TABLET BY MOUTH EVERY MORNING and TAKE ONE TABLET BY MOUTH EVERYDAY AT BEDTIME 180 tablet 3   lisinopril (ZESTRIL) 20 MG tablet TAKE ONE TABLET BY MOUTH EVERYDAY AT BEDTIME  90 tablet 3   nitroGLYCERIN (NITROSTAT) 0.4 MG SL tablet DISSOLVE 1 TABLET UNDER THE TONGUE EVERY 5 MINUTES AS NEEDED FOR CHEST PAIN. DO NOT EXCEED A TOTAL OF 3 DOSES IN 15 MINUTES. 25 tablet 11   No current facility-administered medications for this visit.    Allergies:   Patient has no known allergies.    ROS:  Please see the history of present illness.   Otherwise, review of systems are positive for none.   All other systems are reviewed and negative.    PHYSICAL EXAM: VS:  BP 108/64   Pulse (!) 53   Ht '5\' 6"'$  (1.676 m)   Wt 176 lb 9.6 oz (80.1 kg)   SpO2 96%   BMI 28.50 kg/m  , BMI Body mass index is 28.5 kg/m.  GENERAL:  Well appearing NECK:  No jugular venous distention, waveform within normal limits, carotid upstroke brisk and symmetric, no bruits, no thyromegaly LUNGS:  Clear to auscultation bilaterally CHEST:  Unremarkable HEART:  PMI not displaced or sustained,S1 and S2 within normal limits, no S3, no S4, no clicks, no rubs, no murmurs ABD:  Flat, positive bowel sounds normal in frequency in pitch, no bruits, no rebound, no guarding, no midline pulsatile mass, no hepatomegaly, no splenomegaly EXT:  2 plus pulses throughout, no edema, no cyanosis no clubbing   EKG:  EKG is   ordered today. Sinus rhythm rate 53 , axis within normal limits, intervals within normal limits, no acute ST-T wave changes.   Recent Labs: 12/26/2021: BUN 21; Creatinine, Ser 1.31; Hemoglobin 13.9; Platelets 218.0; Potassium 4.9; Sodium 139    Lipid Panel    Component Value Date/Time   CHOL 123 12/26/2021 0911   TRIG 119.0 12/26/2021 0911   TRIG 161 (H) 08/28/2006 1023   HDL 48.40 12/26/2021 0911   CHOLHDL 3 12/26/2021 0911   VLDL 23.8 12/26/2021 0911   LDLCALC 51 12/26/2021 0911   LDLCALC 60 03/12/2020 1144      Wt Readings from Last 3 Encounters:  05/19/22 176 lb 9.6 oz (80.1 kg)  03/18/22 173 lb (78.5 kg)  12/26/21 175 lb (79.4 kg)      Other studies Reviewed: Additional  studies/ records that were reviewed today include: None. Review of the above records demonstrates:  See elsewhere    ASSESSMENT AND PLAN:   CORONARY ARTERY DISEASE, S/P PTCA -  The patient has no new sypmtoms.  No further cardiovascular testing is indicated.  We will continue with aggressive risk reduction and meds as listed.  HYPERLIPIDEMIA -  His LDL was 51 with  HDL of 48.  No change in therapy.   HYPERTENSION -  The blood pressure is controlled.  He brings me an extensive diary.  No change in therapy.  BRADYCARDIA - He tolerates this without any presyncope or syncope.  Current medicines are reviewed at length with the patient today.  The patient does not have concerns regarding medicines.  The following changes have been made: None  Labs/ tests ordered today include: None  No orders of the defined types were placed in this encounter.     Disposition:   FU with me in 12 months.     Signed, Minus Breeding, MD  05/19/2022 8:42 AM    [place]

## 2022-05-19 ENCOUNTER — Ambulatory Visit: Payer: PPO | Attending: Cardiology | Admitting: Cardiology

## 2022-05-19 ENCOUNTER — Encounter: Payer: Self-pay | Admitting: Gastroenterology

## 2022-05-19 ENCOUNTER — Encounter: Payer: Self-pay | Admitting: Cardiology

## 2022-05-19 ENCOUNTER — Other Ambulatory Visit: Payer: Self-pay | Admitting: Internal Medicine

## 2022-05-19 VITALS — BP 108/64 | HR 53 | Ht 66.0 in | Wt 176.6 lb

## 2022-05-19 DIAGNOSIS — E785 Hyperlipidemia, unspecified: Secondary | ICD-10-CM | POA: Diagnosis not present

## 2022-05-19 DIAGNOSIS — G252 Other specified forms of tremor: Secondary | ICD-10-CM

## 2022-05-19 DIAGNOSIS — I10 Essential (primary) hypertension: Secondary | ICD-10-CM

## 2022-05-19 DIAGNOSIS — I2510 Atherosclerotic heart disease of native coronary artery without angina pectoris: Secondary | ICD-10-CM | POA: Diagnosis not present

## 2022-05-19 MED ORDER — LISINOPRIL 20 MG PO TABS: ORAL_TABLET | ORAL | 3 refills | Status: DC

## 2022-05-19 MED ORDER — HYDRALAZINE HCL 25 MG PO TABS: ORAL_TABLET | ORAL | 3 refills | Status: DC

## 2022-05-19 MED ORDER — NITROGLYCERIN 0.4 MG SL SUBL: SUBLINGUAL_TABLET | SUBLINGUAL | 11 refills | Status: DC

## 2022-05-19 MED ORDER — ATORVASTATIN CALCIUM 20 MG PO TABS: ORAL_TABLET | ORAL | 3 refills | Status: DC

## 2022-05-19 MED ORDER — ATENOLOL 50 MG PO TABS: ORAL_TABLET | ORAL | 3 refills | Status: DC

## 2022-05-19 MED ORDER — AMLODIPINE BESYLATE 10 MG PO TABS: ORAL_TABLET | ORAL | 3 refills | Status: DC

## 2022-05-19 NOTE — Addendum Note (Signed)
Addended by: Lubertha Sayres on: 05/19/2022 03:27 PM   Modules accepted: Orders

## 2022-05-19 NOTE — Patient Instructions (Signed)
   Follow-Up: At [facility], you and your health needs are our priority.  As part of our continuing mission to provide you with exceptional heart care, we have created designated Provider Care Teams.  These Care Teams include your primary Cardiologist (physician) and Advanced Practice Providers (APPs -  Physician Assistants and Nurse Practitioners) who all work together to provide you with the care you need, when you need it.  We recommend signing up for the patient portal called "MyChart".  Sign up information is provided on this After Visit Summary.  MyChart is used to connect with patients for Virtual Visits (Telemedicine).  Patients are able to view lab/test results, encounter notes, upcoming appointments, etc.  Non-urgent messages can be sent to your provider as well.   To learn more about what you can do with MyChart, go to [email or web address].    Your next appointment:   12 month(s)  The format for your next appointment:   In Person  Provider:   James Hochrein, MD     

## 2022-06-06 ENCOUNTER — Other Ambulatory Visit: Payer: Self-pay | Admitting: Internal Medicine

## 2022-06-06 DIAGNOSIS — G252 Other specified forms of tremor: Secondary | ICD-10-CM

## 2022-06-06 DIAGNOSIS — K219 Gastro-esophageal reflux disease without esophagitis: Secondary | ICD-10-CM

## 2022-06-06 DIAGNOSIS — K2270 Barrett's esophagus without dysplasia: Secondary | ICD-10-CM

## 2022-06-19 ENCOUNTER — Ambulatory Visit: Payer: PPO | Admitting: Cardiology

## 2022-07-08 ENCOUNTER — Telehealth: Payer: PPO

## 2022-07-16 ENCOUNTER — Ambulatory Visit: Payer: PPO | Admitting: Gastroenterology

## 2022-07-16 ENCOUNTER — Encounter: Payer: Self-pay | Admitting: Gastroenterology

## 2022-07-16 VITALS — BP 126/78 | HR 52 | Ht 66.0 in | Wt 176.0 lb

## 2022-07-16 DIAGNOSIS — Z86010 Personal history of colonic polyps: Secondary | ICD-10-CM

## 2022-07-16 DIAGNOSIS — Z860101 Personal history of adenomatous and serrated colon polyps: Secondary | ICD-10-CM

## 2022-07-16 DIAGNOSIS — K2270 Barrett's esophagus without dysplasia: Secondary | ICD-10-CM

## 2022-07-16 MED ORDER — NA SULFATE-K SULFATE-MG SULF 17.5-3.13-1.6 GM/177ML PO SOLN: 1.0000 | Freq: Once | ORAL | 0 refills | Status: DC

## 2022-07-16 MED ORDER — NA SULFATE-K SULFATE-MG SULF 17.5-3.13-1.6 GM/177ML PO SOLN: 1.0000 | Freq: Once | ORAL | 0 refills | Status: AC

## 2022-07-16 NOTE — Patient Instructions (Signed)
You have been scheduled for an endoscopy and colonoscopy. Please follow the written instructions given to you at your visit today. Please pick up your prep supplies at the pharmacy within the next 1-3 days. If you use inhalers (even only as needed), please bring them with you on the day of your procedure.  The [facility] providers would like to encourage you to use MYCHART to communicate with providers for non-urgent requests or questions.  Due to long hold times on the telephone, sending your provider a message by MYCHART may be a faster and more efficient way to get a response.  Please allow 48 business hours for a response.  Please remember that this is for non-urgent requests.   Due to recent changes in healthcare laws, you may see the results of your imaging and laboratory studies on MyChart before your provider has had a chance to review them.  We understand that in some cases there may be results that are confusing or concerning to you. Not all laboratory results come back in the same time frame and the provider may be waiting for multiple results in order to interpret others.  Please give us 48 hours in order for your provider to thoroughly review all the results before contacting the office for clarification of your results.   Thank you for choosing me and [facility].  Malcolm T. Stark, Jr., MD., FACG  

## 2022-07-16 NOTE — Progress Notes (Signed)
Assessment    Long segment Barrett's without dysplasia, GERD, medium-sized hiatal hernia with [name] erosions Personal history of adenomatous colon polyps  Recommendations   Continue pantoprazole 40 mg qd and follow antireflux measures The risks (including bleeding, perforation, infection, missed lesions, medication reactions and possible hospitalization or surgery if complications occur), benefits, and alternatives to colonoscopy with possible biopsy and possible polypectomy were discussed with the patient and they consent to proceed.  The risks (including bleeding, perforation, infection, missed lesions, medication reactions and possible hospitalization or surgery if complications occur), benefits, and alternatives to endoscopy with possible biopsy and possible dilation were discussed with the patient and they consent to proceed.      HPI   Chief complaint: Barrett's, personal history of adenomatous colon polyps  Patient profile:  [name] is a 81 y.o. male referred by Scarlette Calico, MD for Barrett's esophagus and a personal history of adenomatous colon polyps.  His reflux symptoms are under very good control.  He has occasional nocturnal breakthrough symptoms when eating late at night which she generally tries to avoid.  No other gastrointestinal complaints.  His overall health is very good and he is interested in proceeding with EGD and colonoscopy. Denies weight loss, abdominal pain, constipation, diarrhea, change in stool caliber, melena, hematochezia, nausea, vomiting, dysphagia, chest pain.   Previous Labs / Imaging::    Latest Ref Rng & Units 12/26/2021    9:11 AM 03/14/2021   10:39 AM 03/12/2020   11:44 AM  CBC  WBC 4.0 - 10.5 K/uL 7.6  7.7  7.9   Hemoglobin 13.0 - 17.0 g/dL 13.9  14.7  14.2   Hematocrit 39.0 - 52.0 % 41.7  43.9  42.9   Platelets 150.0 - 400.0 K/uL 218.0  217.0  217     No results found for: "LIPASE"    Latest Ref Rng & Units 12/26/2021    9:11  AM 03/14/2021   10:39 AM 10/04/2020    2:03 PM  CMP  Glucose 70 - 99 mg/dL 98  100  84   BUN 6 - 23 mg/dL '21  20  16   '$ Creatinine 0.40 - 1.50 mg/dL 1.31  1.37  1.25   Sodium 135 - 145 mEq/L 139  138  140   Potassium 3.5 - 5.1 mEq/L 4.9  5.0  5.0   Chloride 96 - 112 mEq/L 104  102  102   CO2 19 - 32 mEq/L 30  30  33   Calcium 8.4 - 10.5 mg/dL 9.9  10.1  10.3   Total Protein 6.0 - 8.3 g/dL  7.1  7.5   Total Bilirubin 0.2 - 1.2 mg/dL  0.5  0.4   Alkaline Phos 39 - 117 U/L  65  69   AST 0 - 37 U/L  22  21   ALT 0 - 53 U/L  19  20      Previous GI evaluation    Endoscopies:  EGD Aug 2020 - Esophageal mucosal changes secondary to established long-segment Barrett's disease. Biopsied. - Medium-sized hiatal hernia. - Non-bleeding erosive gastropathy. Cameron erosions. - Erythematous duodenopathy. - Duodenal stenosis, mild.  Colonoscopy Aug 2014 - 3 small colon polyps (TAs) - Moderate diverticulosis - Small internal hemorrhoids  Imaging:  ECHOCARDIOGRAM COMPLETE    ECHOCARDIOGRAM REPORT       Patient Name:   [name] Date of Exam: 11/06/2020 Medical Rec #:  [id number]        Height:  66.0 in Accession #:    [id number]       Weight:       175.6 lb Date of Birth:  1940-12-16        BSA:          1.892 m Patient Age:    25 years         BP:           128/62 mmHg Patient Gender: M                HR:           48 bpm. Exam Location:  [facility]  Procedure: 2D Echo, 3D Echo, Cardiac Doppler, Color Doppler and Strain Analysis  Indications:    R06.00 Dyspnea   History:        Patient has no prior history of Echocardiogram examinations.                 Risk Factors:Hypertension and Dyslipidemia. Coronary artery                 disease. Chronic kidney disease. Myocardial infarction.   Sonographer:    Wilford Sports Rodgers-Jones RDCS Referring Phys: [place]   1. Left ventricular ejection fraction, by estimation, is 60 to 65%. The left  ventricle has normal function. The left ventricle has no regional wall motion abnormalities. There is mild left ventricular hypertrophy. Left ventricular diastolic parameters  are consistent with Grade II diastolic dysfunction (pseudonormalization). The average left ventricular global longitudinal strain is -22.8 %. The global longitudinal strain is normal.  2. Right ventricular systolic function is normal. The right ventricular size is normal. There is normal pulmonary artery systolic pressure.  3. Left atrial size was moderately dilated.  4. The mitral valve is normal in structure. Mild mitral valve regurgitation. No evidence of mitral stenosis.  5. The aortic valve is tricuspid. Aortic valve regurgitation is mild. No aortic stenosis is present.  6. Aortic dilatation noted. There is mild dilatation of the ascending aorta, measuring 40 mm.  7. The inferior vena cava is normal in size with greater than 50% respiratory variability, suggesting right atrial pressure of 3 mmHg.  FINDINGS  Left Ventricle: Left ventricular ejection fraction, by estimation, is 60 to 65%. The left ventricle has normal function. The left ventricle has no regional wall motion abnormalities. The average left ventricular global longitudinal strain is -22.8 %.  The global longitudinal strain is normal. The left ventricular internal cavity size was normal in size. There is mild left ventricular hypertrophy. Left ventricular diastolic parameters are consistent with Grade II diastolic dysfunction  (pseudonormalization). Indeterminate filling pressures.  Right Ventricle: The right ventricular size is normal. No increase in right ventricular wall thickness. Right ventricular systolic function is normal. There is normal pulmonary artery systolic pressure. The tricuspid regurgitant velocity is 1.91 m/s, and  with an assumed right atrial pressure of 3 mmHg, the estimated right ventricular systolic pressure is 65.7 mmHg.  Left Atrium:  Left atrial size was moderately dilated.  Right Atrium: Right atrial size was normal in size.  Pericardium: There is no evidence of pericardial effusion.  Mitral Valve: The mitral valve is normal in structure. Mild mitral valve regurgitation. No evidence of mitral valve stenosis.  Tricuspid Valve: The tricuspid valve is normal in structure. Tricuspid valve regurgitation is mild . No evidence of tricuspid stenosis.  Aortic Valve: The aortic valve is tricuspid. Aortic valve regurgitation is mild. No aortic stenosis is present.  Pulmonic Valve: The pulmonic valve was normal in structure. Pulmonic valve regurgitation is trivial. No evidence of pulmonic stenosis.  Aorta: Aortic dilatation noted. There is mild dilatation of the ascending aorta, measuring 40 mm.  Venous: The inferior vena cava is normal in size with greater than 50% respiratory variability, suggesting right atrial pressure of 3 mmHg.  IAS/Shunts: No atrial level shunt detected by color flow Doppler.    LEFT VENTRICLE PLAX 2D LVIDd:         4.60 cm  Diastology LVIDs:         2.60 cm  LV e' medial:    6.09 cm/s LV PW:         1.20 cm  LV E/e' medial:  12.1 LV IVS:        0.90 cm  LV e' lateral:   9.46 cm/s LVOT diam:     2.10 cm  LV E/e' lateral: 7.8 LV SV:         80 LV SV Index:   42       2D Longitudinal Strain LVOT Area:     3.46 cm 2D Strain GLS (A2C):   -23.5 %                         2D Strain GLS (A3C):   -20.0 %                         2D Strain GLS (A4C):   -24.8 %                         2D Strain GLS Avg:     -22.8 %                           3D Volume EF:                         3D EF:        66 %                         LV EDV:       97 ml                         LV ESV:       33 ml                         LV SV:        64 ml  RIGHT VENTRICLE             IVC RV Basal diam:  4.90 cm     IVC diam: 1.60 cm RV S prime:     13.40 cm/s TAPSE (M-mode): 2.7 cm  LEFT ATRIUM             Index       RIGHT  ATRIUM           Index LA diam:        5.30 cm 2.80 cm/m  RA Area:     17.40 cm LA Vol (A2C):   51.9 ml 27.43 ml/m RA Volume:   46.90 ml  24.79 ml/m LA Vol (A4C):   80.7 ml 42.65 ml/m LA Biplane Vol: 68.2 ml 36.04  ml/m  AORTIC VALVE LVOT Vmax:   110.00 cm/s LVOT Vmean:  74.300 cm/s LVOT VTI:    0.232 m   AORTA Ao Root diam: 3.20 cm Ao Asc diam:  4.00 cm  MITRAL VALVE               TRICUSPID VALVE MV Area (PHT): 2.16 cm    TR Peak grad:   14.6 mmHg MV Decel Time: 352 msec    TR Vmax:        191.00 cm/s MV E velocity: 73.70 cm/s MV A velocity: 63.00 cm/s  SHUNTS MV E/A ratio:  1.17        Systemic VTI:  0.23 m                            Systemic Diam: 2.10 cm  Skeet Latch MD Electronically signed by Skeet Latch MD Signature Date/Time: 11/06/2020/6:19:56 PM      Final      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 - decreased dose of HCTZ to M,F   Diverticulosis    DJD (degenerative joint disease)    Duodenal ulcer    ED (erectile dysfunction)    GERD (gastroesophageal reflux disease)    increased s/s in the last week 03-22-19 PV    Hemorrhoids    Hyperlipidemia    Hypertension    Myocardial infarction ([place])    2002   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   mid frontal scalp-cx91f   SCC (squamous cell carcinoma) 06/15/2019   front middle scalp-tx cx39f  SCC (squamous cell carcinoma) 12/27/2002   CIS-right angle of jaw--cx3552f SCC (squamous cell carcinoma) 09/24/2004   CIS--mid post crown, left mis crown medial,left first web space-tx cx35f54fSCC (squamous cell carcinoma) 06/03/2006   SCCA-right temple-tx [id number] (squamous cell carcinoma)  06/03/2006   CIS-right forehead, left mis crown scalp medial-tx cx35fu 51fC (squamous cell carcinoma) 07/30/2006   right temple lateral-txpbx   SCC (squamous cell carcinoma) 11/10/2006   left forehead, left hand proximal   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/02/2009   left scalp-cx35fu  74f (squamous cell carcinoma) 01/24/2015   left scalp-cx35fu   39f(squamous cell carcinoma) 11/12/2015   front lateralscalp-cx35fu, me31f scalp-cx35fu   SC88fquamous cell carcinoma) 12/17/2016   front center scalp-cx35fu   Squ68fs cell carcinoma of skin 08/10/2017   CIS--right ear rim, top of scalp, left side scalp-tx-cx35fu   Past78fgical History:  Procedure Laterality Date   COLONOSCOPY     ELBOW ARTHROSCOPY     heart stents     2002,2004   SPINAL FUSION     SPINAL FUSION     SPINE SURGERY     cervical   UPPER GASTROINTESTINAL ENDOSCOPY     Family History  Problem Relation Age of Onset   Alcohol abuse Other    Coronary artery disease Other    Alzheimer's disease Other    Dementia Mother    Colon cancer Neg Hx    Esophageal cancer Neg  Hx    Rectal cancer Neg Hx    Stomach cancer Neg Hx    Colon polyps Neg Hx    Social History   Tobacco Use   Smoking status: Former    Types: Cigarettes    Quit date: 08/25/1961    Years since quitting: 60.9    Passive exposure: Never   Smokeless tobacco: Never  Vaping Use   Vaping Use: Never used  Substance Use Topics   Alcohol use: Yes    Comment: very seldom   Drug use: No   Current Outpatient Medications  Medication Sig Dispense Refill   amLODipine (NORVASC) 10 MG tablet TAKE ONE TABLET BY MOUTH EVERYDAY AT BEDTIME 90 tablet 3   aspirin EC 81 MG tablet Take 81 mg by mouth daily. Swallow whole.     atenolol (TENORMIN) 50 MG tablet TAKE ONE TABLET BY MOUTH EVERY MORNING and TAKE ONE TABLET BY MOUTH EVERYDAY AT BEDTIME 180 tablet 3   atorvastatin (LIPITOR) 20 MG tablet TAKE ONE TABLET BY MOUTH EVERYDAY AT BEDTIME 90 tablet 3   Coenzyme  Q10 (CO Q 10 PO) Take 1 tablet by mouth daily.     hydrALAZINE (APRESOLINE) 25 MG tablet TAKE ONE TABLET BY MOUTH EVERY MORNING and TAKE ONE TABLET BY MOUTH EVERYDAY AT BEDTIME 180 tablet 3   KRILL OIL PO Take 1 tablet by mouth daily.     lisinopril (ZESTRIL) 20 MG tablet TAKE ONE TABLET BY MOUTH EVERYDAY AT BEDTIME 90 tablet 3   Multiple Vitamins-Minerals (CENTRUM SILVER 50+MEN PO) Take 1 tablet by mouth.     pantoprazole (PROTONIX) 40 MG tablet TAKE ONE TABLET BY MOUTH EVERY MORNING 90 tablet 0   primidone (MYSOLINE) 50 MG tablet TAKE ONE TABLET BY MOUTH every morning and TAKE ONE TABLET BY MOUTH AT bedtime 180 tablet 0   nitroGLYCERIN (NITROSTAT) 0.4 MG SL tablet DISSOLVE 1 TABLET UNDER THE TONGUE EVERY 5 MINUTES AS NEEDED FOR CHEST PAIN. DO NOT EXCEED A TOTAL OF 3 DOSES IN 15 MINUTES. (Patient not taking: Reported on 07/16/2022) 25 tablet 11   No current facility-administered medications for this visit.   No Known Allergies  Review of Systems: All other systems reviewed and negative except where noted in HPI.    Physical Exam    Wt Readings from Last 3 Encounters:  07/16/22 176 lb (79.8 kg)  05/19/22 176 lb 9.6 oz (80.1 kg)  03/18/22 173 lb (78.5 kg)    BP 126/78   Pulse (!) 52   Ht '5\' 6"'$  (1.676 m)   Wt 176 lb (79.8 kg)   BMI 28.41 kg/m  Constitutional:  Generally well appearing male in no acute distress. Psychiatric: Pleasant. Normal mood and affect. Behavior is normal. HEENT: Pupils normal.  Conjunctivae are normal. No scleral icterus. Neck supple.  Cardiovascular: Normal rate, regular rhythm. No edema Pulmonary/chest: Effort normal and breath sounds normal. No wheezing, rales or rhonchi. Abdominal: Soft, nondistended, nontender. Bowel sounds active throughout. There are no masses palpable. No hepatomegaly. Rectal: Deferred to colonoscopy  Neurological: Alert and oriented to person place and time. Skin: Skin is warm and dry. No rashes noted.  Lucio Edward, MD   cc:   Referring Provider [name], MD

## 2022-07-16 NOTE — Addendum Note (Signed)
Addended by: Dorisann Frames L on: 07/16/2022 09:24 AM   Modules accepted: Orders

## 2022-08-06 ENCOUNTER — Encounter: Payer: Self-pay | Admitting: Gastroenterology

## 2022-08-13 ENCOUNTER — Encounter: Payer: Self-pay | Admitting: Internal Medicine

## 2022-08-13 ENCOUNTER — Ambulatory Visit (INDEPENDENT_AMBULATORY_CARE_PROVIDER_SITE_OTHER): Payer: PPO | Admitting: Internal Medicine

## 2022-08-13 VITALS — BP 122/72 | HR 47 | Temp 97.9°F | Ht 66.0 in | Wt 175.6 lb

## 2022-08-13 DIAGNOSIS — L309 Dermatitis, unspecified: Secondary | ICD-10-CM | POA: Diagnosis not present

## 2022-08-13 DIAGNOSIS — I10 Essential (primary) hypertension: Secondary | ICD-10-CM

## 2022-08-13 MED ORDER — FLUOCINONIDE EMULSIFIED BASE 0.05 % EX CREA: 1.0000 | TOPICAL_CREAM | Freq: Two times a day (BID) | CUTANEOUS | 0 refills | Status: DC

## 2022-08-13 NOTE — Patient Instructions (Signed)
Dyshidrotic Eczema Dyshidrotic eczema, also known as pompholyx, is a type of eczema that causes very itchy, fluid-filled blisters (vesicles) to form on the hands and feet. It is more common before age 83, though it can affect people of any age. There is no cure, but treatment and certain lifestyle changes can help relieve symptoms. What are the causes? The cause of this condition is not known. What increases the risk? You are more likely to develop this condition if: You wash your hands frequently. You have a personal or family history of eczema, allergies, asthma, or hay fever. You are allergic to metals, such as nickel or cobalt. You work with cement. You smoke. What are the signs or symptoms? Symptoms of this condition may affect the hands, the feet, or both. Symptoms may come and go (recur), and may include: Severe itching. This may happen before blisters appear. Blisters. These may form suddenly. In the early stages, blisters may form near the fingertips. In severe cases, blisters may grow to large blister masses (bullae). Blisters resolve in 2-3 weeks without bursting. This is followed by a dry phase in which itching eases. Pain and swelling. Cracks or long, narrow openings (fissures) in the skin. Severe dryness. Ridges on the nails. How is this diagnosed? This condition may be diagnosed based on: Your symptoms and a physical exam. Your medical history. Skin scrapings to rule out a fungal infection. Testing a swab of fluid for bacteria (culture). Removing a small piece of skin (biopsy) to test for infection or to rule out other conditions. Skin patch tests. These tests involve using patches that contain possible allergens and placing them on your back. Your health care provider will wait a few days and then check to see if an allergic reaction occurred. These tests may be done if your health care provider suspects allergic reactions, or to rule out other types of eczema. You may  be referred to a health care provider who specializes in skin conditions (dermatologist) to help diagnose and treat this condition. How is this treated? There is no cure for this condition, but treatment can help relieve symptoms. Depending on the amount and severity of the blisters, your health care provider may suggest: Avoiding allergens, irritants, or triggers that worsen symptoms. This may involve lifestyle changes, such as: Using different lotions or soaps. Avoiding hot weather or places that will cause you to sweat a lot. Managing stress with coping techniques, such as relaxation and exercise, and asking for help when you need it. Diet changes as recommended by your health care provider. Using a clean, damp towel (cool compress) to relieve symptoms. Soaking in a bath that contains a type of salt that relieves irritation (aluminum acetate soaks). Medicines, such as: Medicine taken by mouth to reduce itching (oral antihistamines). Medicine applied to the skin to reduce swelling and irritation (topical corticosteroids). Medicine that reduces the activity of the body's disease-fighting system (immunosuppressants) to treat inflammation. This may be given in severe cases. Antibiotic medicines to treat bacterial infection. Light therapy (phototherapy). This involves shining ultraviolet (UV) light on the affected skin in order to reduce itchiness and inflammation. Follow these instructions at home: Bathing and skin care  Wash skin gently. After bathing or washing your hands, pat your skin dry. Avoid rubbing your skin. Remove all jewelry before bathing. If the skin under the jewelry stays wet, blisters may form or get worse. Apply cool compresses as told by your health care provider. To do this: Soak a clean towel in  cool water. Wring out excess water until towel is damp. Place the towel over the affected skin. Leave the towel on for 20 minutes at a time, 2-3 times a day. Use mild soaps,  cleansers, and lotions that do not contain dyes, perfumes, or other irritants. Keep your skin hydrated. To do this: Avoid very hot water. Take lukewarm baths or showers. Apply moisturizer within 3 minutes of bathing. This locks in moisture. Medicines Take and apply over-the-counter and prescription medicines only as told by your health care provider. If you were prescribed an antibiotic medicine, take or apply it as told by your health care provider. Do not stop using the antibiotic even if you start to feel better. General instructions Do not use any products that contain nicotine or tobacco. These include cigarettes, chewing tobacco, and vaping devices, such as e-cigarettes. If you need help quitting, ask your health care provider. Identify and avoid triggers and allergens. Keep fingernails short to avoid breaking the skin while scratching. Use waterproof gloves to protect your hands when doing work that keeps your hands wet for a long time. Wear socks to keep your feet dry. Keep all follow-up visits. This is important. Contact a health care provider if: You have symptoms that do not go away. You have signs of infection, such as: Crusting, pus, or a bad smell. More redness, swelling, or pain. Increased warmth in the affected area. Get help right away if: Your skin gets streaking redness with associated pain. Summary Dyshidrotic eczema, also known as pompholyx, is a type of eczema that causes very itchy, fluid-filled blisters (vesicles) to form on the hands and feet. The cause of this condition is not known. There is no cure for this condition, but treatment can help relieve symptoms. Treatment depends on the amount and severity of the blisters. Use mild soaps, cleansers, and lotions that do not contain dyes, perfumes, or other irritants. Keep your skin hydrated. This information is not intended to replace advice given to you by your health care provider. Make sure you discuss any  questions you have with your health care provider. Document Revised: 05/21/2020 Document Reviewed: 05/21/2020 Elsevier Patient Education  [employer].

## 2022-08-13 NOTE — Progress Notes (Signed)
Subjective:  Patient ID: [name], male    DOB: Nov 25, 1940  Age: 81 y.o. MRN: [id number]  CC: Rash   HPI [name] presents for f/up -   He complains of a red/burning rash on the dorsum of both hands (R>>>L) for 2 weeks after using a new spray disinfectant. He has been using a lotion without improvement.  Outpatient Medications Prior to Visit  Medication Sig Dispense Refill   amLODipine (NORVASC) 10 MG tablet TAKE ONE TABLET BY MOUTH EVERYDAY AT BEDTIME 90 tablet 3   aspirin EC 81 MG tablet Take 81 mg by mouth daily. Swallow whole.     atenolol (TENORMIN) 50 MG tablet TAKE ONE TABLET BY MOUTH EVERY MORNING and TAKE ONE TABLET BY MOUTH EVERYDAY AT BEDTIME 180 tablet 3   atorvastatin (LIPITOR) 20 MG tablet TAKE ONE TABLET BY MOUTH EVERYDAY AT BEDTIME 90 tablet 3   Coenzyme Q10 (CO Q 10 PO) Take 1 tablet by mouth daily.     hydrALAZINE (APRESOLINE) 25 MG tablet TAKE ONE TABLET BY MOUTH EVERY MORNING and TAKE ONE TABLET BY MOUTH EVERYDAY AT BEDTIME 180 tablet 3   KRILL OIL PO Take 1 tablet by mouth daily.     lisinopril (ZESTRIL) 20 MG tablet TAKE ONE TABLET BY MOUTH EVERYDAY AT BEDTIME 90 tablet 3   Multiple Vitamins-Minerals (CENTRUM SILVER 50+MEN PO) Take 1 tablet by mouth.     nitroGLYCERIN (NITROSTAT) 0.4 MG SL tablet DISSOLVE 1 TABLET UNDER THE TONGUE EVERY 5 MINUTES AS NEEDED FOR CHEST PAIN. DO NOT EXCEED A TOTAL OF 3 DOSES IN 15 MINUTES. 25 tablet 11   pantoprazole (PROTONIX) 40 MG tablet TAKE ONE TABLET BY MOUTH EVERY MORNING 90 tablet 0   primidone (MYSOLINE) 50 MG tablet TAKE ONE TABLET BY MOUTH every morning and TAKE ONE TABLET BY MOUTH AT bedtime 180 tablet 0   No facility-administered medications prior to visit.    ROS Review of Systems  Constitutional: Negative.  Negative for chills, fatigue and fever.  HENT: Negative.    Eyes: Negative.   Respiratory:  Negative for cough, chest tightness and wheezing.   Cardiovascular:  Negative for chest pain,  palpitations and leg swelling.  Gastrointestinal:  Negative for abdominal pain, diarrhea and nausea.  Endocrine: Negative.   Genitourinary: Negative.  Negative for difficulty urinating.  Musculoskeletal: Negative.   Skin:  Positive for color change and rash.  Neurological:  Negative for dizziness, syncope and light-headedness.  Hematological:  Negative for adenopathy. Does not bruise/bleed easily.  Psychiatric/Behavioral: Negative.      Objective:  BP 122/72 (BP Location: Left Arm, Patient Position: Sitting, Cuff Size: Normal)   Pulse (!) 47   Temp 97.9 F (36.6 C) (Oral)   Ht '5\' 6"'$  (1.676 m)   Wt 175 lb 9.6 oz (79.7 kg)   SpO2 95%   BMI 28.34 kg/m   BP Readings from Last 3 Encounters:  08/14/22 (!) 100/52  08/13/22 122/72  07/16/22 126/78    Wt Readings from Last 3 Encounters:  08/14/22 176 lb (79.8 kg)  08/13/22 175 lb 9.6 oz (79.7 kg)  07/16/22 176 lb (79.8 kg)    Physical Exam Vitals reviewed.  Eyes:     General: No scleral icterus.    Conjunctiva/sclera: Conjunctivae normal.  Cardiovascular:     Rate and Rhythm: Regular rhythm. Bradycardia present.     Heart sounds: No murmur heard. Pulmonary:     Effort: Pulmonary effort is normal.     Breath sounds:  No stridor. No wheezing, rhonchi or rales.  Abdominal:     General: Abdomen is flat.     Palpations: There is no mass.     Tenderness: There is no abdominal tenderness. There is no guarding.     Hernia: No hernia is present.  Musculoskeletal:     Cervical back: Neck supple.  Skin:    General: Skin is warm.     Coloration: Skin is not pale.     Findings: Erythema and rash present.     Comments: On the dorsum of both hands, worse on the right than the left, there are large plaques of erythema.  These extend onto the dorsum of the fingers on the right hand.  There is a small area of fissuring on the right hand.  There is no warmth, exudate, streaking, induration, tenderness, or fluctuance.  Neurological:      General: No focal deficit present.     Mental Status: He is alert. Mental status is at baseline.  Psychiatric:        Mood and Affect: Mood normal.        Behavior: Behavior normal.     Lab Results  Component Value Date   WBC 7.6 12/26/2021   HGB 13.9 12/26/2021   HCT 41.7 12/26/2021   PLT 218.0 12/26/2021   GLUCOSE 98 12/26/2021   CHOL 123 12/26/2021   TRIG 119.0 12/26/2021   HDL 48.40 12/26/2021   LDLCALC 51 12/26/2021   ALT 19 03/14/2021   AST 22 03/14/2021   NA 139 12/26/2021   K 4.9 12/26/2021   CL 104 12/26/2021   CREATININE 1.31 12/26/2021   BUN 21 12/26/2021   CO2 30 12/26/2021   TSH 1.13 03/14/2021   PSA 2.62 02/15/2018    EXERCISE TOLERANCE TEST (ETT)  Result Date: 10/13/2018  Blood pressure demonstrated a normal response to exercise.  There was no ST segment deviation noted during stress.  No chest pain noted with stress  HR response was submaximal, reducing the sensitivity of this test to detect ischemia.     Assessment & Plan:   Khoi was seen today for rash.  Diagnoses and all orders for this visit:  Acute hand eczema- Will treat for acute hand eczema/contact dermatitis with a mid potency steroid cream. -     fluocinonide-emollient (LIDEX-E) 0.05 % cream; Apply 1 Application topically 2 (two) times daily.  Essential hypertension- His blood pressure is adequately well-controlled.  He has no symptoms related to the bradycardia.   I am having Steven Comes "Joe" start on fluocinonide-emollient. I am also having him maintain his Multiple Vitamins-Minerals (CENTRUM SILVER 50+MEN PO), Coenzyme Q10 (CO Q 10 PO), KRILL OIL PO, aspirin EC, amLODipine, atenolol, atorvastatin, hydrALAZINE, lisinopril, nitroGLYCERIN, pantoprazole, and primidone.  Meds ordered this encounter  Medications   fluocinonide-emollient (LIDEX-E) 0.05 % cream    Sig: Apply 1 Application topically 2 (two) times daily.    Dispense:  60 g    Refill:  0     Follow-up: Return  in about 3 months (around 11/12/2022).  Scarlette Calico, MD

## 2022-08-14 ENCOUNTER — Ambulatory Visit (AMBULATORY_SURGERY_CENTER): Payer: PPO | Admitting: Gastroenterology

## 2022-08-14 ENCOUNTER — Other Ambulatory Visit: Payer: Self-pay | Admitting: Gastroenterology

## 2022-08-14 ENCOUNTER — Encounter: Payer: Self-pay | Admitting: Gastroenterology

## 2022-08-14 VITALS — BP 100/52 | HR 51 | Temp 97.1°F | Resp 16 | Ht 66.0 in | Wt 176.0 lb

## 2022-08-14 DIAGNOSIS — Z86010 Personal history of colonic polyps: Secondary | ICD-10-CM

## 2022-08-14 DIAGNOSIS — K2270 Barrett's esophagus without dysplasia: Secondary | ICD-10-CM

## 2022-08-14 DIAGNOSIS — K2950 Unspecified chronic gastritis without bleeding: Secondary | ICD-10-CM | POA: Diagnosis not present

## 2022-08-14 DIAGNOSIS — Z09 Encounter for follow-up examination after completed treatment for conditions other than malignant neoplasm: Secondary | ICD-10-CM | POA: Diagnosis not present

## 2022-08-14 DIAGNOSIS — D123 Benign neoplasm of transverse colon: Secondary | ICD-10-CM | POA: Diagnosis not present

## 2022-08-14 MED ORDER — SODIUM CHLORIDE 0.9 % IV SOLN: 500.0000 mL | Freq: Once | INTRAVENOUS | Status: DC

## 2022-08-14 NOTE — Progress Notes (Signed)
Called to room to assist during endoscopic procedure.  Patient ID and intended procedure confirmed with present staff. Received instructions for my participation in the procedure from the performing physician.  

## 2022-08-14 NOTE — Progress Notes (Signed)
See 11/22/203 H&P, no changes

## 2022-08-14 NOTE — Op Note (Signed)
[facility] Patient Name: [name] Procedure Date: 08/14/2022 2:34 PM MRN: [id number] Endoscopist: Ladene Artist , MD, [id number] Age: 81 Referring MD:  Date of Birth: Sep 27, 1940 Gender: Male Account #: [id number] Procedure:                Colonoscopy Indications:              Surveillance: Personal history of colonic polyps                            (unknown histology) on last colonoscopy more than 5                            years ago Medicines:                Monitored Anesthesia Care Procedure:                Pre-Anesthesia Assessment:                           - Prior to the procedure, a History and Physical                            was performed, and patient medications and                            allergies were reviewed. The patient's tolerance of                            previous anesthesia was also reviewed. The risks                            and benefits of the procedure and the sedation                            options and risks were discussed with the patient.                            All questions were answered, and informed consent                            was obtained. Prior Anticoagulants: The patient has                            taken no anticoagulant or antiplatelet agents. ASA                            Grade Assessment: III - A patient with severe                            systemic disease. After reviewing the risks and                            benefits, the patient was deemed in satisfactory  condition to undergo the procedure.                           After obtaining informed consent, the colonoscope                            was passed under direct vision. Throughout the                            procedure, the patient's blood pressure, pulse, and                            oxygen saturations were monitored continuously. The                            Olympus CF-HQ190L (Serial# [id number])  Colonoscope was                            introduced through the anus and advanced to the the                            cecum, identified by appendiceal orifice and                            ileocecal valve. The Olympus CF-HQ190L ([id number])                            Colonoscope was introduced through the anus and                            advanced to the the cecum, identified by                            appendiceal orifice and ileocecal valve. The                            ileocecal valve, appendiceal orifice, and rectum                            were photographed. The photo of the cecum,                            appendiceal orifice did not capture. The quality of                            the bowel preparation was adequate. The colonoscopy                            was performed without difficulty. The patient                            tolerated the procedure well. Scope In: 2:50:40 PM Scope Out: 3:05:25 PM Scope Withdrawal Time: 0 hours 11 minutes 55 seconds  Total Procedure Duration: 0 hours 14 minutes  45 seconds  Findings:                 The perianal and digital rectal examinations were                            normal.                           Two sessile polyps were found in the transverse                            colon. The polyps were 5 to 8 mm in size. These                            polyps were removed with a cold snare. Resection                            and retrieval were complete.                           Multiple medium-mouthed diverticula were found in                            the sigmoid colon, descending colon and transverse                            colon. There was evidence of diverticular spasm.                            There was evidence of an impacted diverticulum.                            There was no evidence of diverticular bleeding.                           Internal hemorrhoids were found during                             retroflexion. The hemorrhoids were small and Grade                            I (internal hemorrhoids that do not prolapse).                           The exam was otherwise without abnormality on                            direct and retroflexion views. Complications:            No immediate complications. Estimated blood loss:                            None. Estimated Blood Loss:     Estimated blood loss: none. Impression:               - Two 5  to 8 mm polyps in the transverse colon,                            removed with a cold snare. Resected and retrieved.                           - Moderate diverticulosis in the sigmoid colon, in                            the descending colon and in the transverse colon.                           - Internal hemorrhoids.                           - The examination was otherwise normal on direct                            and retroflexion views. Recommendation:           - Patient has a contact number available for                            emergencies. The signs and symptoms of potential                            delayed complications were discussed with the                            patient. Return to normal activities tomorrow.                            Written discharge instructions were provided to the                            patient.                           - High fiber diet.                           - Continue present medications.                           - Await pathology results.                           - No repeat colonoscopy due to age. Ladene Artist, MD 08/14/2022 3:26:31 PM This report has been signed electronically.

## 2022-08-14 NOTE — Patient Instructions (Addendum)
RECOMMENDATIONS: - Patient has a contact number available for emergencies. The signs and symptoms of potential delayed complications were discussed with the patient. Return to normal activities tomorrow. Written discharge instructions were provided to the patient. - High fiber diet. -Follow anti-reflux measures. - Continue present medications. - Await pathology results. - No repeat colonoscopy due to age.    YOU HAD AN ENDOSCOPIC PROCEDURE TODAY AT [facility]:   Refer to the procedure report that was given to you for any specific questions about what was found during the examination.  If the procedure report does not answer your questions, please call your gastroenterologist to clarify.  If you requested that your care partner not be given the details of your procedure findings, then the procedure report has been included in a sealed envelope for you to review at your convenience later.  YOU SHOULD EXPECT: Some feelings of bloating in the abdomen. Passage of more gas than usual.  Walking can help get rid of the air that was put into your GI tract during the procedure and reduce the bloating. If you had a lower endoscopy (such as a colonoscopy or flexible sigmoidoscopy) you may notice spotting of blood in your stool or on the toilet paper. If you underwent a bowel prep for your procedure, you may not have a normal bowel movement for a few days.  Please Note:  You might notice some irritation and congestion in your nose or some drainage.  This is from the oxygen used during your procedure.  There is no need for concern and it should clear up in a day or so.  SYMPTOMS TO REPORT IMMEDIATELY:  Following lower endoscopy (colonoscopy or flexible sigmoidoscopy):  Excessive amounts of blood in the stool  Significant tenderness or worsening of abdominal pains  Swelling of the abdomen that is new, acute  Fever of 100F or higher  Following upper endoscopy (EGD)  Vomiting of blood or  coffee ground material  New chest pain or pain under the shoulder blades  Painful or persistently difficult swallowing  New shortness of breath  Fever of 100F or higher  Black, tarry-looking stools  For urgent or emergent issues, a gastroenterologist can be reached at any hour by calling [phone]. Do not use MyChart messaging for urgent concerns.    DIET:  We do recommend a small meal at first, but then you may proceed to your regular diet.  Drink plenty of fluids but you should avoid alcoholic beverages for 24 hours. Follow a High Fiber Diet for overall gastrointestinal health.  MEDICATIONS: Continue present medications.  Please see handouts given to you by your recovery nurse: polyps, diverticulosis, hemorrhoids, hiatal hernia and anti-reflux measures, high fiber diet.  Thank you for allowing [place] to provide for your healthcare needs today.  ACTIVITY:  You should plan to take it easy for the rest of today and you should NOT DRIVE or use heavy machinery until tomorrow (because of the sedation medicines used during the test).    FOLLOW UP: Our staff will call the number listed on your records the next business day following your procedure.  We will call around 7:15- 8:00 am to check on you and address any questions or concerns that you may have regarding the information given to you following your procedure. If we do not reach you, we will leave a message.     If any biopsies were taken you will be contacted by phone or by letter within the next 1-3  weeks.  Please call us at [phone] if you have not heard about the biopsies in 3 weeks.    SIGNATURES/CONFIDENTIALITY: You and/or your care partner have signed paperwork which will be entered into your electronic medical record.  These signatures attest to the fact that that the information above on your After Visit Summary has been reviewed and is understood.  Full responsibility of the confidentiality of this discharge information  lies with you and/or your care-partner.

## 2022-08-14 NOTE — Op Note (Signed)
[facility] Patient Name: [name] Procedure Date: 08/14/2022 2:43 PM MRN: [id number] Endoscopist: Ladene Artist , MD, [id number] Age: 81 Referring MD:  Date of Birth: [birth year] Gender: Male Account #: [id number] Procedure:                Upper GI endoscopy Indications:              Surveillance for malignancy due to personal history                            of Barrett's esophagus Medicines:                Monitored Anesthesia Care Procedure:                Pre-Anesthesia Assessment:                           - Prior to the procedure, a History and Physical                            was performed, and patient medications and                            allergies were reviewed. The patient's tolerance of                            previous anesthesia was also reviewed. The risks                            and benefits of the procedure and the sedation                            options and risks were discussed with the patient.                            All questions were answered, and informed consent                            was obtained. Prior Anticoagulants: The patient has                            taken no anticoagulant or antiplatelet agents. ASA                            Grade Assessment: III - A patient with severe                            systemic disease. After reviewing the risks and                            benefits, the patient was deemed in satisfactory                            condition to undergo the procedure.                           -  Prior to the procedure, a History and Physical                            was performed, and patient medications and                            allergies were reviewed. The patient's tolerance of                            previous anesthesia was also reviewed. The risks                            and benefits of the procedure and the sedation                            options and risks were discussed  with the patient.                            All questions were answered, and informed consent                            was obtained. Prior Anticoagulants: The patient has                            taken no anticoagulant or antiplatelet agents. ASA                            Grade Assessment: III - A patient with severe                            systemic disease. After reviewing the risks and                            benefits, the patient was deemed in satisfactory                            condition to undergo the procedure.                           After obtaining informed consent, the endoscope was                            passed under direct vision. Throughout the                            procedure, the patient's blood pressure, pulse, and                            oxygen saturations were monitored continuously. The                            Endoscope was introduced through the mouth, and  advanced to the second part of duodenum. The upper                            GI endoscopy was accomplished without difficulty.                            The patient tolerated the procedure well. Scope In: Scope Out: Findings:                 There were esophageal mucosal changes secondary to                            established long-segment Barrett's disease present                            in the distal esophagus. The maximum longitudinal                            extent of these mucosal changes was 6 cm in length.                            Mucosa was biopsied with a cold forceps for                            histology. One specimen bottle was sent to                            pathology.                           The exam of the esophagus was otherwise normal.                           A medium-sized hiatal hernia was present.                           The exam of the stomach was otherwise normal.                           The duodenal bulb and  second portion of the                            duodenum were normal. Complications:            No immediate complications. Estimated Blood Loss:     Estimated blood loss was minimal. Impression:               - Esophageal mucosal changes secondary to                            established long-segment Barrett's disease.                            Biopsied.                           -  Medium-sized hiatal hernia.                           - Normal duodenal bulb and second portion of the                            duodenum. Recommendation:           - Patient has a contact number available for                            emergencies. The signs and symptoms of potential                            delayed complications were discussed with the                            patient. Return to normal activities tomorrow.                            Written discharge instructions were provided to the                            patient.                           - Continue present medications.                           - Resume prior diet.                           - Follow antireflux measures.                           - Await pathology results.                           - No repeat upper endoscopy for surveillance of                            Barrett's esophagus due to age if no dysplasia                            noted. Ladene Artist, MD 08/14/2022 3:33:26 PM This report has been signed electronically.

## 2022-08-14 NOTE — Progress Notes (Signed)
Sedate, gd SR, tolerated procedure well, VSS, report to RN 

## 2022-08-15 ENCOUNTER — Telehealth: Payer: Self-pay | Admitting: *Deleted

## 2022-08-15 NOTE — Telephone Encounter (Signed)
  Follow up Call-     08/14/2022    2:12 PM  Call back number  Post procedure Call Back phone  # [phone]  Permission to leave phone message Yes     Patient questions:  Do you have a fever, pain , or abdominal swelling? No. Pain Score  0 *  Have you tolerated food without any problems? Yes.    Have you been able to return to your normal activities? Yes.    Do you have any questions about your discharge instructions: Diet   No. Medications  No. Follow up visit  No.  Do you have questions or concerns about your Care? No.  Actions: * If pain score is 4 or above: No action needed, pain <4.

## 2022-09-01 ENCOUNTER — Emergency Department (HOSPITAL_COMMUNITY): Payer: PPO

## 2022-09-01 ENCOUNTER — Telehealth: Payer: Self-pay | Admitting: Cardiology

## 2022-09-01 ENCOUNTER — Other Ambulatory Visit: Payer: Self-pay

## 2022-09-01 ENCOUNTER — Emergency Department (HOSPITAL_COMMUNITY)
Admission: EM | Admit: 2022-09-01 | Discharge: 2022-09-02 | Disposition: A | Discharge status: Home / Self Care | Payer: PPO | Attending: Emergency Medicine | Admitting: Emergency Medicine

## 2022-09-01 ENCOUNTER — Encounter (HOSPITAL_COMMUNITY): Payer: Self-pay

## 2022-09-01 DIAGNOSIS — Z79899 Other long term (current) drug therapy: Secondary | ICD-10-CM | POA: Insufficient documentation

## 2022-09-01 DIAGNOSIS — R072 Precordial pain: Secondary | ICD-10-CM | POA: Diagnosis not present

## 2022-09-01 DIAGNOSIS — R0781 Pleurodynia: Secondary | ICD-10-CM | POA: Insufficient documentation

## 2022-09-01 DIAGNOSIS — Z7982 Long term (current) use of aspirin: Secondary | ICD-10-CM | POA: Insufficient documentation

## 2022-09-01 DIAGNOSIS — R519 Headache, unspecified: Secondary | ICD-10-CM | POA: Diagnosis not present

## 2022-09-01 DIAGNOSIS — R0602 Shortness of breath: Secondary | ICD-10-CM | POA: Diagnosis not present

## 2022-09-01 DIAGNOSIS — I2510 Atherosclerotic heart disease of native coronary artery without angina pectoris: Secondary | ICD-10-CM | POA: Insufficient documentation

## 2022-09-01 DIAGNOSIS — I10 Essential (primary) hypertension: Secondary | ICD-10-CM | POA: Diagnosis not present

## 2022-09-01 DIAGNOSIS — R079 Chest pain, unspecified: Secondary | ICD-10-CM | POA: Diagnosis not present

## 2022-09-01 LAB — BASIC METABOLIC PANEL
Anion gap: 6 (ref 5–15)
BUN: 18 mg/dL (ref 8–23)
CO2: 28 mmol/L (ref 22–32)
Calcium: 9.5 mg/dL (ref 8.9–10.3)
Chloride: 103 mmol/L (ref 98–111)
Creatinine, Ser: 1.17 mg/dL (ref 0.61–1.24)
GFR, Estimated: >60 mL/min (ref >60)
Glucose, Bld: 94 mg/dL (ref 70–99)
Potassium: 3.9 mmol/L (ref 3.5–5.1)
Sodium: 137 mmol/L (ref 135–145)

## 2022-09-01 LAB — CBC
HCT: 40.8 % (ref 39.0–52.0)
Hemoglobin: 13.2 g/dL (ref 13.0–17.0)
MCH: 31.2 pg (ref 26.0–34.0)
MCHC: 32.4 g/dL (ref 30.0–36.0)
MCV: 96.5 fL (ref 80.0–100.0)
Platelets: 253 10*3/uL (ref 150–400)
RBC: 4.23 MIL/uL (ref 4.22–5.81)
RDW: 12.6 % (ref 11.5–15.5)
WBC: 7.8 10*3/uL (ref 4.0–10.5)
nRBC: 0 % (ref 0.0–0.2)

## 2022-09-01 LAB — TROPONIN I (HIGH SENSITIVITY)
Troponin I (High Sensitivity): 11 ng/L (ref <18)
Troponin I (High Sensitivity): 10 ng/L (ref <18)

## 2022-09-01 MED ORDER — LISINOPRIL 20 MG PO TABS
20.0000 mg | ORAL_TABLET | Freq: Once | ORAL | Status: AC
  Administered 2022-09-01: 20 mg via ORAL
  Filled 2022-09-01: qty 1

## 2022-09-01 MED ORDER — IOHEXOL 350 MG/ML SOLN
60.0000 mL | Freq: Once | INTRAVENOUS | Status: AC | PRN
  Administered 2022-09-01: 60 mL via INTRAVENOUS

## 2022-09-01 MED ORDER — AMLODIPINE BESYLATE 5 MG PO TABS
10.0000 mg | ORAL_TABLET | Freq: Once | ORAL | Status: AC
  Administered 2022-09-01: 10 mg via ORAL
  Filled 2022-09-01: qty 2

## 2022-09-01 MED ORDER — ACETAMINOPHEN 325 MG PO TABS
650.0000 mg | ORAL_TABLET | Freq: Once | ORAL | Status: AC
  Administered 2022-09-01: 650 mg via ORAL
  Filled 2022-09-01: qty 2

## 2022-09-01 MED ORDER — HYDRALAZINE HCL 25 MG PO TABS
25.0000 mg | ORAL_TABLET | Freq: Once | ORAL | Status: AC
  Administered 2022-09-01: 25 mg via ORAL
  Filled 2022-09-01: qty 1

## 2022-09-01 NOTE — ED Provider Triage Note (Signed)
Emergency Medicine Provider Triage Evaluation Note  [name] , a 82 y.o. male  was evaluated in triage.  Pt complains of chest discomfort and elevated blood pressure   Review of Systems  Positive: Chest tightness Negative: fever  Physical Exam  BP (!) 170/98 (BP Location: Left Arm)   Pulse (!) 54   Temp 98 F (36.7 C) (Oral)   Resp 17   Ht '5\' 6"'$  (1.676 m)   Wt 77.6 kg   SpO2 96%   BMI 27.60 kg/m  Gen:   Awake, no distress   Resp:  Normal effort  MSK:   Moves extremities without difficulty  Other:    Medical Decision Making  Medically screening exam initiated at 6:07 PM.  Appropriate orders placed.  [name] was informed that the remainder of the evaluation will be completed by another provider, this initial triage assessment does not replace that evaluation, and the importance of remaining in the ED until their evaluation is complete.     Fransico Meadow, [place] 09/01/22 [id number]

## 2022-09-01 NOTE — Telephone Encounter (Signed)
Received stat call from patient who states that he started having chest pressure on Friday night immediately following sexual intercourse. Patient reports he was very short of breath and felt a lot of chest pressure. Patient reports that his blood pressure was also very elevated in the 160's-170's at that time. Patient also states that he took nitro with some relief and a decrease in his BP. Patient states that he had some shortness of breath and chest pressure over the weekend and again today. Patient states that he was walking this afternoon and reports that he got very short of breath and felt the same chest pressure. Patient states that this is similar to how he felt with his stent placement in the past. Advised patient to report to the ER to be seen. Patient is aware of recommendations and states that he is going there now. Advised patient I would forward to Dr. [name] to make aware.

## 2022-09-01 NOTE — ED Provider Notes (Signed)
Blood pressure (!) 150/138, pulse (!) 50, temperature 98.3 F (36.8 C), temperature source Oral, resp. rate (!) 21, height '5\' 6"'$  (1.676 m), weight 77.6 kg, SpO2 94 %.  Assuming care from Dr. Maylon Peppers.  In short, [name] is a 82 y.o. male with a chief complaint of Chest Pain .  Refer to the original H&P for additional details.  The current plan of care is to follow up with CT imaging and reassess.  01:07 AM  CT chest without acute finding.  CT head similarly unremarkable.  I reevaluated the patient.  We discussed his history of CAD and presenting symptoms.  I also discussed the case with cardiology fellow on-call, Dr. Gwynneth Aliment.  I had a shared decision-making conversation with the patient and wife at bedside.  He would prefer to return home this evening with strict ED return precautions should his chest pain return.  He will call the cardiology clinic first thing tomorrow for expedited follow-up.    Margette Fast, MD 09/02/22 [phone]

## 2022-09-01 NOTE — ED Provider Notes (Signed)
[facility] EMERGENCY DEPARTMENT Provider Note   CSN: [id number] Arrival date & time: 09/01/22  1623     History  Chief Complaint  Patient presents with   Chest Pain    [name] is a 82 y.o. male.  Patient is an 82 year old male with a past medical history of CAD status post stent placement, hypertension and hyperlipidemia presenting to the emergency department with chest pain.  Patient states that on Friday he has had on and off exertional chest pain.  He states it is left-sided and feels like a pressure type of pain.  He states it is nonradiating and is associated with some mild shortness of breath.  Denies any fevers, chills or cough, nausea, vomiting or diaphoresis.  He states that he has had headaches over the weekend as well that have not resolved with Tylenol.  He denies any numbness or weakness in his arms or legs.  The history is provided by the patient and the spouse.  Chest Pain      Home Medications Prior to Admission medications   Medication Sig Start Date End Date Taking? Authorizing Provider  amLODipine (NORVASC) 10 MG tablet TAKE ONE TABLET BY MOUTH EVERYDAY AT BEDTIME 05/19/22   Minus Breeding, MD  aspirin EC 81 MG tablet Take 81 mg by mouth daily. Swallow whole.    [provider]  atenolol (TENORMIN) 50 MG tablet TAKE ONE TABLET BY MOUTH EVERY MORNING and TAKE ONE TABLET BY MOUTH EVERYDAY AT BEDTIME 05/19/22   Minus Breeding, MD  atorvastatin (LIPITOR) 20 MG tablet TAKE ONE TABLET BY MOUTH EVERYDAY AT BEDTIME 05/19/22   Minus Breeding, MD  Coenzyme Q10 (CO Q 10 PO) Take 1 tablet by mouth daily.    [provider]  fluocinonide-emollient (LIDEX-E) 0.05 % cream Apply 1 Application topically 2 (two) times daily. 08/13/22   [name], MD  hydrALAZINE (APRESOLINE) 25 MG tablet TAKE ONE TABLET BY MOUTH EVERY MORNING and TAKE ONE TABLET BY MOUTH EVERYDAY AT BEDTIME 05/19/22   Minus Breeding, MD  KRILL OIL PO Take 1 tablet  by mouth daily.    [provider]  lisinopril (ZESTRIL) 20 MG tablet TAKE ONE TABLET BY MOUTH EVERYDAY AT BEDTIME 05/19/22   Minus Breeding, MD  Multiple Vitamins-Minerals (CENTRUM SILVER 50+MEN PO) Take 1 tablet by mouth.    [provider]  nitroGLYCERIN (NITROSTAT) 0.4 MG SL tablet DISSOLVE 1 TABLET UNDER THE TONGUE EVERY 5 MINUTES AS NEEDED FOR CHEST PAIN. DO NOT EXCEED A TOTAL OF 3 DOSES IN 15 MINUTES. 05/19/22   Minus Breeding, MD  pantoprazole (PROTONIX) 40 MG tablet TAKE ONE TABLET BY MOUTH EVERY MORNING 06/06/22   [name], MD  primidone (MYSOLINE) 50 MG tablet TAKE ONE TABLET BY MOUTH every morning and TAKE ONE TABLET BY MOUTH AT bedtime 06/06/22   [name], MD      Allergies    Patient has no known allergies.    Review of Systems   Review of Systems  Cardiovascular:  Positive for chest pain.    Physical Exam Updated Vital Signs BP (!) 150/138   Pulse (!) 50   Temp 98.3 F (36.8 C) (Oral)   Resp (!) 21   Ht '5\' 6"'$  (1.676 m)   Wt 77.6 kg   SpO2 94%   BMI 27.60 kg/m  Physical Exam Vitals and nursing note reviewed.  Constitutional:      General: He is not in acute distress.  Appearance: He is well-developed.  HENT:     Head: Normocephalic and atraumatic.  Eyes:     Extraocular Movements: Extraocular movements intact.  Cardiovascular:     Rate and Rhythm: Normal rate and regular rhythm.     Pulses:          Radial pulses are 2+ on the right side and 2+ on the left side.       Dorsalis pedis pulses are 2+ on the right side and 2+ on the left side.     Heart sounds: Normal heart sounds.  Pulmonary:     Effort: Pulmonary effort is normal.     Breath sounds: Normal breath sounds.  Chest:     Chest wall: No tenderness.  Abdominal:     Palpations: Abdomen is soft.     Tenderness: There is no abdominal tenderness.  Musculoskeletal:        General: Normal range of motion.     Cervical back: Normal range of motion and neck supple.      Right lower leg: No edema.     Left lower leg: No edema.  Skin:    General: Skin is warm and dry.  Neurological:     General: No focal deficit present.     Mental Status: He is alert and oriented to person, place, and time.  Psychiatric:        Mood and Affect: Mood normal.        Behavior: Behavior normal.     ED Results / Procedures / Treatments   Labs (all labs ordered are listed, but only abnormal results are displayed) Labs Reviewed  BASIC METABOLIC PANEL  CBC  TROPONIN I (HIGH SENSITIVITY)  TROPONIN I (HIGH SENSITIVITY)    EKG EKG Interpretation  Date/Time:  Monday September 01 2022 18:00:31 EST Ventricular Rate:  47 PR Interval:  224 QRS Duration: 88 QT Interval:  466 QTC Calculation: 412 R Axis:   3 Text Interpretation: Sinus bradycardia with 1st degree A-V block Otherwise normal ECG No significant change since last tracing Confirmed by Leanord Asal ([id number]) on 09/01/2022 9:41:14 PM  Radiology CT Chest W Contrast  Result Date: 09/01/2022 CLINICAL DATA:  Chest pain EXAM: CT CHEST WITH CONTRAST TECHNIQUE: Multidetector CT imaging of the chest was performed during intravenous contrast administration. RADIATION DOSE REDUCTION: This exam was performed according to the departmental dose-optimization program which includes automated exposure control, adjustment of the mA and/or kV according to patient size and/or use of iterative reconstruction technique. CONTRAST:  59m OMNIPAQUE IOHEXOL 350 MG/ML SOLN COMPARISON:  Chest x-ray 09/01/2022. FINDINGS: Cardiovascular: No significant vascular findings. Normal heart size. No pericardial effusion. There are atherosclerotic calcifications of the coronary arteries. Mediastinum/Nodes: No enlarged mediastinal, hilar, or axillary lymph nodes. Thyroid gland, trachea, and esophagus demonstrate no significant findings. Lungs/Pleura: Lungs are clear. No pleural effusion or pneumothorax. Upper Abdomen: There is a large hiatal hernia containing  the proximal stomach. 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 There is a calcified granuloma in the liver. Musculoskeletal: Degenerative changes affect the spine. Lower cervical spinal fusion plate is present. IMPRESSION: 1. No acute cardiopulmonary process. 2. Large hiatal hernia. Electronically Signed   By: ARonney AstersM.D.   On: 09/01/2022 23:50   CT Head Wo Contrast  Result Date: 09/01/2022 CLINICAL DATA:  Headache, new onset. EXAM: CT HEAD WITHOUT CONTRAST TECHNIQUE: Contiguous axial images were obtained from the base of the skull through the vertex without intravenous contrast. RADIATION DOSE REDUCTION: This exam was performed according to the  departmental dose-optimization program which includes automated exposure control, adjustment of the mA and/or kV according to patient size and/or use of iterative reconstruction technique. COMPARISON:  None Available. FINDINGS: Brain: No evidence of acute infarction, hemorrhage, hydrocephalus, extra-axial collection or mass lesion/mass effect. Mild cerebral atrophy. Patchy area of low-attenuation white matter presumed chronic microvascular ischemic changes. Vascular: No hyperdense vessel or unexpected calcification. Skull: Normal. Negative for fracture or focal lesion. Sinuses/Orbits: Mucous retention cyst in the right maxillary sinus. Other: None. IMPRESSION: 1. No acute intracranial abnormality. 2. Atrophy and chronic microvascular ischemic changes of the white matter. Electronically Signed   By: Keane Police D.O.   On: 09/01/2022 23:45   DG Chest 2 View  Result Date: 09/01/2022 CLINICAL DATA:  Chest pain worse with exertion. EXAM: CHEST - 2 VIEW COMPARISON:  Chest radiograph 02/06/2016 FINDINGS: The cardiomediastinal silhouette is normal. There is masslike opacity in the medial left lower lobe seen on the frontal projection, new since 2017. There is no other focal airspace opacity. There is no pulmonary edema. There is no pleural effusion or pneumothorax. There is no  acute osseous abnormality. Cervical spine fusion hardware is noted. IMPRESSION: Masslike opacity in the medial left lower lobe seen on the frontal projection is new since 2017. Recommend CT chest for further evaluation. Electronically Signed   By: Valetta Mole M.D.   On: 09/01/2022 18:49    Procedures Procedures    Medications Ordered in ED Medications  acetaminophen (TYLENOL) tablet 650 mg (650 mg Oral Given 09/01/22 2305)  amLODipine (NORVASC) tablet 10 mg (10 mg Oral Given 09/01/22 2305)  hydrALAZINE (APRESOLINE) tablet 25 mg (25 mg Oral Given 09/01/22 2305)  lisinopril (ZESTRIL) tablet 20 mg (20 mg Oral Given 09/01/22 2305)  iohexol (OMNIPAQUE) 350 MG/ML injection 60 mL (60 mLs Intravenous Contrast Given 09/01/22 2325)    ED Course/ Medical Decision Making/ A&P Clinical Course as of 09/02/22 0002  Mon Sep 01, 2022  2330 Patient signed out to Dr. Laverta Baltimore pending CT reads and reassessment [VK]    Clinical Course User Index [VK] Kemper Durie, DO                           Medical Decision Making This patient presents to the ED with chief complaint(s) of chest pain with pertinent past medical history of CAD, HTN, HLD which further complicates the presenting complaint. The complaint involves an extensive differential diagnosis and also carries with it a high risk of complications and morbidity.    The differential diagnosis includes ACS, arrhythmia, anemia, pneumonia, pneumothorax, pulmonary edema, pleural effusion, dissection unlikely due to the chronicity of the pain and that the pain is nonradiating with equal pulses in all 4 extremities and no neurologic deficits, ICH, mass effect  Additional history obtained: Additional history obtained from spouse Records reviewed cardiology records  ED Course and Reassessment: Patient was initially evaluated by provider in triage and had EKG labs and chest x-ray performed.  EKG is nonischemic and initial troponin was negative.  Repeat seen drawn at  this time.  Chest x-ray showed concern for masslike opacity in his left lower lobe and will have a CT of his chest for further evaluation.  Patient will also have a CT head due to his headaches have not improved with Tylenol.  He has no focal neurologic deficits on exam concerning for CVA or vascular abnormality.  Patient will be given his nighttime blood pressure medications and will be closely reassessed.  Independent labs interpretation:  The following labs were independently interpreted: Within normal range  Independent visualization of imaging: - I independently visualized the following imaging with scope of interpretation limited to determining acute life threatening conditions related to emergency care: Chest x-ray, which revealed left lower lobe opacity     Amount and/or Complexity of Data Reviewed Labs: ordered. Radiology: ordered.  Risk OTC drugs. Prescription drug management.          Final Clinical Impression(s) / ED Diagnoses Final diagnoses:  None    Rx / DC Orders ED Discharge Orders     None         Kemper Durie, DO 09/02/22 0002

## 2022-09-01 NOTE — ED Triage Notes (Signed)
Pt presents with 3 day hx of CP, elevated B/P, and a HA. CP is located to the L chest without radiation. Pain is dexcribed as a pressure. Pt states the pain is worse with exertion and alleviated with rest. Pt reports associated ShOB.

## 2022-09-01 NOTE — Telephone Encounter (Signed)
Pt c/o BP issue: STAT if pt c/o blurred vision, one-sided weakness or slurred speech  1. What are your last 5 BP readings? 169/70, 166/,took nitroglycerin for BP to bring it down  2. Are you having any other symptoms (ex. Dizziness, headache, blurred vision, passed out)? SOB with exertion, chest heaviness, headaches  3. What is your BP issue? Patient states his BP has been a bit high   Pt c/o of Chest Pain: STAT if CP now or developed within 24 hours  1. Are you having CP right now? yes  2. Are you experiencing any other symptoms (ex. SOB, nausea, vomiting, sweating)? Increased SOB during exertion, headaches  3. How long have you been experiencing CP? Since Friday  4. Is your CP continuous or coming and going? Comes and goes  5. Have you taken Nitroglycerin? Yes   Patient states he has been having chest pressure and SOB during exertion. He states he is still feeling some chest pressure now. He states his BP has also been high. He says he has similar symptoms before and so he suspects he may need a heart cath.

## 2022-09-02 ENCOUNTER — Ambulatory Visit (INDEPENDENT_AMBULATORY_CARE_PROVIDER_SITE_OTHER): Payer: PPO | Admitting: Cardiology

## 2022-09-02 ENCOUNTER — Encounter: Payer: Self-pay | Admitting: Cardiology

## 2022-09-02 VITALS — BP 144/88 | HR 58 | Ht 66.0 in | Wt 174.8 lb

## 2022-09-02 DIAGNOSIS — I10 Essential (primary) hypertension: Secondary | ICD-10-CM

## 2022-09-02 DIAGNOSIS — I25110 Atherosclerotic heart disease of native coronary artery with unstable angina pectoris: Secondary | ICD-10-CM | POA: Diagnosis not present

## 2022-09-02 DIAGNOSIS — E785 Hyperlipidemia, unspecified: Secondary | ICD-10-CM | POA: Diagnosis not present

## 2022-09-02 MED ORDER — ISOSORBIDE MONONITRATE ER 30 MG PO TB24: 30.0000 mg | ORAL_TABLET | Freq: Every day | ORAL | 3 refills | Status: DC

## 2022-09-02 NOTE — Telephone Encounter (Signed)
Patient has been seen in the ER 

## 2022-09-02 NOTE — H&P (View-Only) (Signed)
Cardiology Office Note   Date:  09/02/2022   ID:  [name], DOB 02/03/1941, MRN [id number]  PCP:  [name], MD  Cardiologist:   Minus Breeding, MD   Chief Complaint  Patient presents with   Chest Pain      History of Present Illness: [name] is a 82 y.o. male who presents for follow up of his CAD.  He was added to my schedule today because of chest pain he has started to have with exertion.  He was in the emergency room yesterday and I reviewed these records.  His pain started Friday night.  He was exerting himself.  Since that time he has had discomfort walking mild to moderate distance on level ground.  In fact leaving the emergency room he had some discomfort walking to his car.  He has not had any resting unprovoked symptoms and he has not had any today.  He said it was 7 out of 10.  It felt like his previous angina.  There was no radiation to his jaw or to his arms.  He was short of breath.  It was mid chest.  There was no associated nausea vomiting or diaphoresis.  On Friday night he did take a nitroglycerin.  His blood pressure was very elevated at that time in the 657Q systolic and came down to the 130s.  It was elevated when he was in the ER.  His enzymes were negative.  There were no acute EKG changes.  He was also having headache and had head CT with no acute process.  Chest CT demonstrated no acute cardiopulmonary process although there is a large hiatal hernia.   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 - decreased dose of HCTZ to M,F   Diverticulosis    DJD (degenerative joint disease)    Duodenal ulcer    ED (erectile dysfunction)    GERD  (gastroesophageal reflux disease)    increased s/s in the last week 03-22-19 PV    Hemorrhoids    Hyperlipidemia    Hypertension    Myocardial infarction ([place])    2002   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   mid frontal scalp-cx44f   SCC (squamous cell carcinoma) 06/15/2019   front middle scalp-tx cx364f  SCC (squamous cell carcinoma) 12/27/2002   CIS-right angle of jaw--[id number] SCC (squamous cell carcinoma) 09/24/2004   CIS--mid post crown, left mis crown medial,left first web space-tx [id number] (squamous cell carcinoma) 06/03/2006   SCCA-right temple-tx [id number] (squamous cell carcinoma) 06/03/2006   CIS-right forehead, left mis crown scalp medial-tx cx35fu 20fC (squamous cell carcinoma) 07/30/2006   right temple lateral-txpbx   SCC (squamous cell carcinoma) 11/10/2006   left forehead, left hand proximal   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/02/2009   left scalp-cx35fu  35f (squamous cell carcinoma) 01/24/2015   left scalp-cx35fu   43f(squamous cell carcinoma) 11/12/2015   front lateralscalp-cx35fu, me48f scalp-cx35fu   SC88fquamous cell carcinoma) 12/17/2016   front center scalp-[id number]  Squamous cell carcinoma of skin 08/10/2017   CIS--right ear rim, top of scalp, left side scalp-tx-cx46f    Past Surgical History:  Procedure Laterality Date   COLONOSCOPY     ELBOW ARTHROSCOPY     heart stents     2002,2004   SPINAL FUSION     SPINAL FUSION     SPINE SURGERY     cervical   UPPER GASTROINTESTINAL ENDOSCOPY       Current Outpatient Medications  Medication Sig Dispense Refill   amLODipine (NORVASC) 10 MG tablet TAKE ONE TABLET BY MOUTH EVERYDAY AT BEDTIME 90 tablet 3   aspirin EC 81 MG tablet Take 81 mg by mouth daily. Swallow whole.     atenolol (TENORMIN) 50 MG tablet TAKE ONE TABLET BY MOUTH EVERY MORNING and TAKE ONE TABLET BY MOUTH EVERYDAY AT BEDTIME 180 tablet 3   atorvastatin (LIPITOR) 20 MG tablet TAKE ONE TABLET BY MOUTH EVERYDAY AT  BEDTIME 90 tablet 3   Coenzyme Q10 (CO Q 10 PO) Take 1 tablet by mouth daily.     fluocinonide-emollient (LIDEX-E) 0.05 % cream Apply 1 Application topically 2 (two) times daily. 60 g 0   hydrALAZINE (APRESOLINE) 25 MG tablet TAKE ONE TABLET BY MOUTH EVERY MORNING and TAKE ONE TABLET BY MOUTH EVERYDAY AT BEDTIME 180 tablet 3   isosorbide mononitrate (IMDUR) 30 MG 24 hr tablet Take 1 tablet (30 mg total) by mouth daily. 90 tablet 3   KRILL OIL PO Take 1 tablet by mouth daily.     lisinopril (ZESTRIL) 20 MG tablet TAKE ONE TABLET BY MOUTH EVERYDAY AT BEDTIME 90 tablet 3   Multiple Vitamins-Minerals (CENTRUM SILVER 50+MEN PO) Take 1 tablet by mouth.     nitroGLYCERIN (NITROSTAT) 0.4 MG SL tablet DISSOLVE 1 TABLET UNDER THE TONGUE EVERY 5 MINUTES AS NEEDED FOR CHEST PAIN. DO NOT EXCEED A TOTAL OF 3 DOSES IN 15 MINUTES. 25 tablet 11   pantoprazole (PROTONIX) 40 MG tablet TAKE ONE TABLET BY MOUTH EVERY MORNING 90 tablet 0   primidone (MYSOLINE) 50 MG tablet TAKE ONE TABLET BY MOUTH every morning and TAKE ONE TABLET BY MOUTH AT bedtime 180 tablet 0   Current Facility-Administered Medications  Medication Dose Route Frequency Provider Last Rate Last Admin   0.9 %  sodium chloride infusion  500 mL Intravenous Once SLadene Artist MD        Allergies:   Patient has no known allergies.    ROS:  Please see the history of present illness.   Otherwise, review of systems are positive for none.   All other systems are reviewed and negative.    PHYSICAL EXAM: VS:  BP (!) 144/88   Pulse (!) 58   Ht '5\' 6"'$  (1.676 m)   Wt 174 lb 12.8 oz (79.3 kg)   SpO2 98%   BMI 28.21 kg/m  , BMI Body mass index is 28.21 kg/m.  GENERAL:  Well appearing NECK:  No jugular venous distention, waveform within normal limits, carotid upstroke brisk and symmetric, no bruits, no thyromegaly LUNGS:  Clear to auscultation bilaterally CHEST:  Unremarkable HEART:  PMI not displaced or sustained,S1 and S2 within normal limits, no  S3, no S4, no clicks, no rubs, no murmurs ABD:  Flat, positive bowel sounds normal in frequency in pitch, no bruits, no rebound, no guarding, no midline pulsatile mass, no hepatomegaly, no splenomegaly EXT:  2 plus pulses throughout, no edema, no cyanosis no clubbing  EKG:  EKG is not ordered  today. Sinus rhythm rate  47, axis within normal limits, intervals within normal limits, no acute ST-T wave changes.  This was done today in the emergency room.   Recent Labs: 09/01/2022: BUN 18; Creatinine, Ser 1.17; Hemoglobin 13.2; Platelets 253; Potassium 3.9; Sodium 137    Lipid Panel    Component Value Date/Time   CHOL 123 12/26/2021 0911   TRIG 119.0 12/26/2021 0911   TRIG 161 (H) 08/28/2006 1023   HDL 48.40 12/26/2021 0911   CHOLHDL 3 12/26/2021 0911   VLDL 23.8 12/26/2021 0911   LDLCALC 51 12/26/2021 0911   LDLCALC 60 03/12/2020 1144      Wt Readings from Last 3 Encounters:  09/02/22 174 lb 12.8 oz (79.3 kg)  09/01/22 171 lb (77.6 kg)  08/14/22 176 lb (79.8 kg)      Other studies Reviewed: Additional studies/ records that were reviewed today include: ED. Review of the above records demonstrates:  See elsewhere    ASSESSMENT AND PLAN:   CORONARY ARTERY DISEASE, S/P PTCA -  The patient has symptoms consistent with unstable angina.  I am going to give him Imdur.  Has not had any resting pain today.  He is going to have a cardiac cath tomorrow.  The patient understands that risks included but are not limited to stroke (1 in 1000), death (1 in 34), kidney failure [usually temporary] (1 in 500), bleeding (1 in 200), allergic reaction [possibly serious] (1 in 200).  The patient understands and agrees to proceed.   Of note I will add Imdur 30 mg today.  He has sublingual nitroglycerin.  He has instructions to call 911 should he have any resting symptoms.  HYPERLIPIDEMIA -  His LDL was was most recently 51 with an HDL 48.  No change in therapy.   HYPERTENSION -  The blood pressure  is slightly elevated at home.  If discharged from the hospital we can consider increasing his lisinopril.  I am going to add isosorbide as above.   BRADYCARDIA - He tolerates the current dose of beta-blocker.  No change in therapy.  Current medicines are reviewed at length with the patient today.  The patient does not have concerns regarding medicines.  The following changes have been made: As above  Labs/ tests ordered today include:  No orders of the defined types were placed in this encounter.     Disposition:   FU with me in a couple of weeks after his cath.    Signed, Minus Breeding, MD  09/02/2022 3:28 PM    [place]

## 2022-09-02 NOTE — Patient Instructions (Addendum)
Medication Instructions:  START Imdur 30 mg daily  *If you need a refill on your cardiac medications before your next appointment, please call your pharmacy*  Lab Work: NONE ordered at this time of appointment   If you have labs (blood work) drawn today and your tests are completely normal, you will receive your results only by: Advance (if you have MyChart) OR A paper copy in the mail If you have any lab test that is abnormal or we need to change your treatment, we will call you to review the results.  Testing/Procedures: Your physician has requested that you have a cardiac catheterization. Cardiac catheterization is used to diagnose and/or treat various heart conditions. Doctors may recommend this procedure for a number of different reasons. The most common reason is to evaluate chest pain. Chest pain can be a symptom of coronary artery disease (CAD), and cardiac catheterization can show whether plaque is narrowing or blocking your heart's arteries. This procedure is also used to evaluate the valves, as well as measure the blood flow and oxygen levels in different parts of your heart. For further information please visit [email or web address]. Please follow instruction sheet, as given.  Scheduled for 09/03/22 at 1:00 PM with Dr. [name]   Follow-Up: At [facility], you and your health needs are our priority.  As part of our continuing mission to provide you with exceptional heart care, we have created designated Provider Care Teams.  These Care Teams include your primary Cardiologist (physician) and Advanced Practice Providers (APPs -  Physician Assistants and Nurse Practitioners) who all work together to provide you with the care you need, when you need it.  Your next appointment:   2-3 week(s) post heart cath   The format for your next appointment:   In Person  Provider:   APP        Other Instructions        Cardiac/Peripheral Catheterization   You are  scheduled for a Cardiac Catheterization on Wednesday, January 10 with Dr. Glenetta Hew.  1. Please arrive at the Main Entrance A at [facility]: [address], [place] [address] on January 10 at 11:00 AM (This time is two hours before your procedure to ensure your preparation). Free valet parking service is available. You will check in at ADMITTING. The support person will be asked to wait in the waiting room.  It is OK to have someone drop you off and come back when you are ready to be discharged.        Special note: Every effort is made to have your procedure done on time. Please understand that emergencies sometimes delay scheduled procedures.   . 2. Diet: Do not eat solid foods after midnight.  You may have clear liquids until 5 AM the day of the procedure.  3. Labs: You will NOT need to have blood drawn  4. Medication instructions in preparation for your procedure:   Contrast Allergy: No  On the morning of your procedure, take Aspirin 81 mg and any morning medicines NOT listed above.  You may use sips of water.  5. Plan to go home the same day, you will only stay overnight if medically necessary. 6. You MUST have a responsible adult to drive you home. 7. An adult MUST be with you the first 24 hours after you arrive home. 8. Bring a current list of your medications, and the last time and date medication taken. 9. Bring ID and current insurance cards.  10.Please wear clothes that are easy to get on and off and wear slip-on shoes.  Thank you for allowing [place] to care for you!   --  Invasive Cardiovascular services   Important Information About Sugar

## 2022-09-02 NOTE — Discharge Instructions (Signed)
Please call the cardiology office first thing tomorrow morning for expedited follow-up appointment.  If you have return of your chest discomfort please call 911 and return to the emergency department immediately for reevaluation.

## 2022-09-02 NOTE — Progress Notes (Signed)
Cardiology Office Note   Date:  09/02/2022   ID:  [name], DOB 1941-02-15, MRN [id number]  PCP:  [name], MD  Cardiologist:   Minus Breeding, MD   Chief Complaint  Patient presents with   Chest Pain      History of Present Illness: [name] is a 82 y.o. male who presents for follow up of his CAD.  He was added to my schedule today because of chest pain he has started to have with exertion.  He was in the emergency room yesterday and I reviewed these records.  His pain started Friday night.  He was exerting himself.  Since that time he has had discomfort walking mild to moderate distance on level ground.  In fact leaving the emergency room he had some discomfort walking to his car.  He has not had any resting unprovoked symptoms and he has not had any today.  He said it was 7 out of 10.  It felt like his previous angina.  There was no radiation to his jaw or to his arms.  He was short of breath.  It was mid chest.  There was no associated nausea vomiting or diaphoresis.  On Friday night he did take a nitroglycerin.  His blood pressure was very elevated at that time in the 109N systolic and came down to the 130s.  It was elevated when he was in the ER.  His enzymes were negative.  There were no acute EKG changes.  He was also having headache and had head CT with no acute process.  Chest CT demonstrated no acute cardiopulmonary process although there is a large hiatal hernia.   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 - decreased dose of HCTZ to M,F   Diverticulosis    DJD (degenerative joint disease)    Duodenal ulcer    ED (erectile dysfunction)    GERD  (gastroesophageal reflux disease)    increased s/s in the last week 03-22-19 PV    Hemorrhoids    Hyperlipidemia    Hypertension    Myocardial infarction ([place])    2002   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   mid frontal scalp-cx77f   SCC (squamous cell carcinoma) 06/15/2019   front middle scalp-tx cx35f  SCC (squamous cell carcinoma) 12/27/2002   CIS-right angle of jaw--[id number] SCC (squamous cell carcinoma) 09/24/2004   CIS--mid post crown, left mis crown medial,left first web space-tx [id number] (squamous cell carcinoma) 06/03/2006   SCCA-right temple-tx [id number] (squamous cell carcinoma) 06/03/2006   CIS-right forehead, left mis crown scalp medial-tx cx35fu 59fC (squamous cell carcinoma) 07/30/2006   right temple lateral-txpbx   SCC (squamous cell carcinoma) 11/10/2006   left forehead, left hand proximal   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/02/2009   left scalp-cx35fu  6f (squamous cell carcinoma) 01/24/2015   left scalp-cx35fu   36f(squamous cell carcinoma) 11/12/2015   front lateralscalp-cx35fu, me22f scalp-cx35fu   SC35fquamous cell carcinoma) 12/17/2016   front center scalp-cx35fu39f  Squamous cell carcinoma of skin 08/10/2017   CIS--right ear rim, top of scalp, left side scalp-tx-cx53f    Past Surgical History:  Procedure Laterality Date   COLONOSCOPY     ELBOW ARTHROSCOPY     heart stents     2002,2004   SPINAL FUSION     SPINAL FUSION     SPINE SURGERY     cervical   UPPER GASTROINTESTINAL ENDOSCOPY       Current Outpatient Medications  Medication Sig Dispense Refill   amLODipine (NORVASC) 10 MG tablet TAKE ONE TABLET BY MOUTH EVERYDAY AT BEDTIME 90 tablet 3   aspirin EC 81 MG tablet Take 81 mg by mouth daily. Swallow whole.     atenolol (TENORMIN) 50 MG tablet TAKE ONE TABLET BY MOUTH EVERY MORNING and TAKE ONE TABLET BY MOUTH EVERYDAY AT BEDTIME 180 tablet 3   atorvastatin (LIPITOR) 20 MG tablet TAKE ONE TABLET BY MOUTH EVERYDAY AT  BEDTIME 90 tablet 3   Coenzyme Q10 (CO Q 10 PO) Take 1 tablet by mouth daily.     fluocinonide-emollient (LIDEX-E) 0.05 % cream Apply 1 Application topically 2 (two) times daily. 60 g 0   hydrALAZINE (APRESOLINE) 25 MG tablet TAKE ONE TABLET BY MOUTH EVERY MORNING and TAKE ONE TABLET BY MOUTH EVERYDAY AT BEDTIME 180 tablet 3   isosorbide mononitrate (IMDUR) 30 MG 24 hr tablet Take 1 tablet (30 mg total) by mouth daily. 90 tablet 3   KRILL OIL PO Take 1 tablet by mouth daily.     lisinopril (ZESTRIL) 20 MG tablet TAKE ONE TABLET BY MOUTH EVERYDAY AT BEDTIME 90 tablet 3   Multiple Vitamins-Minerals (CENTRUM SILVER 50+MEN PO) Take 1 tablet by mouth.     nitroGLYCERIN (NITROSTAT) 0.4 MG SL tablet DISSOLVE 1 TABLET UNDER THE TONGUE EVERY 5 MINUTES AS NEEDED FOR CHEST PAIN. DO NOT EXCEED A TOTAL OF 3 DOSES IN 15 MINUTES. 25 tablet 11   pantoprazole (PROTONIX) 40 MG tablet TAKE ONE TABLET BY MOUTH EVERY MORNING 90 tablet 0   primidone (MYSOLINE) 50 MG tablet TAKE ONE TABLET BY MOUTH every morning and TAKE ONE TABLET BY MOUTH AT bedtime 180 tablet 0   Current Facility-Administered Medications  Medication Dose Route Frequency Provider Last Rate Last Admin   0.9 %  sodium chloride infusion  500 mL Intravenous Once SLadene Artist MD        Allergies:   Patient has no known allergies.    ROS:  Please see the history of present illness.   Otherwise, review of systems are positive for none.   All other systems are reviewed and negative.    PHYSICAL EXAM: VS:  BP (!) 144/88   Pulse (!) 58   Ht '5\' 6"'$  (1.676 m)   Wt 174 lb 12.8 oz (79.3 kg)   SpO2 98%   BMI 28.21 kg/m  , BMI Body mass index is 28.21 kg/m.  GENERAL:  Well appearing NECK:  No jugular venous distention, waveform within normal limits, carotid upstroke brisk and symmetric, no bruits, no thyromegaly LUNGS:  Clear to auscultation bilaterally CHEST:  Unremarkable HEART:  PMI not displaced or sustained,S1 and S2 within normal limits, no  S3, no S4, no clicks, no rubs, no murmurs ABD:  Flat, positive bowel sounds normal in frequency in pitch, no bruits, no rebound, no guarding, no midline pulsatile mass, no hepatomegaly, no splenomegaly EXT:  2 plus pulses throughout, no edema, no cyanosis no clubbing  EKG:  EKG is not ordered  today. Sinus rhythm rate  47, axis within normal limits, intervals within normal limits, no acute ST-T wave changes.  This was done today in the emergency room.   Recent Labs: 09/01/2022: BUN 18; Creatinine, Ser 1.17; Hemoglobin 13.2; Platelets 253; Potassium 3.9; Sodium 137    Lipid Panel    Component Value Date/Time   CHOL 123 12/26/2021 0911   TRIG 119.0 12/26/2021 0911   TRIG 161 (H) 08/28/2006 1023   HDL 48.40 12/26/2021 0911   CHOLHDL 3 12/26/2021 0911   VLDL 23.8 12/26/2021 0911   LDLCALC 51 12/26/2021 0911   LDLCALC 60 03/12/2020 1144      Wt Readings from Last 3 Encounters:  09/02/22 174 lb 12.8 oz (79.3 kg)  09/01/22 171 lb (77.6 kg)  08/14/22 176 lb (79.8 kg)      Other studies Reviewed: Additional studies/ records that were reviewed today include: ED. Review of the above records demonstrates:  See elsewhere    ASSESSMENT AND PLAN:   CORONARY ARTERY DISEASE, S/P PTCA -  The patient has symptoms consistent with unstable angina.  I am going to give him Imdur.  Has not had any resting pain today.  He is going to have a cardiac cath tomorrow.  The patient understands that risks included but are not limited to stroke (1 in 1000), death (1 in 37), kidney failure [usually temporary] (1 in 500), bleeding (1 in 200), allergic reaction [possibly serious] (1 in 200).  The patient understands and agrees to proceed.   Of note I will add Imdur 30 mg today.  He has sublingual nitroglycerin.  He has instructions to call 911 should he have any resting symptoms.  HYPERLIPIDEMIA -  His LDL was was most recently 51 with an HDL 48.  No change in therapy.   HYPERTENSION -  The blood pressure  is slightly elevated at home.  If discharged from the hospital we can consider increasing his lisinopril.  I am going to add isosorbide as above.   BRADYCARDIA - He tolerates the current dose of beta-blocker.  No change in therapy.  Current medicines are reviewed at length with the patient today.  The patient does not have concerns regarding medicines.  The following changes have been made: As above  Labs/ tests ordered today include:  No orders of the defined types were placed in this encounter.     Disposition:   FU with me in a couple of weeks after his cath.    Signed, Minus Breeding, MD  09/02/2022 3:28 PM    [place]

## 2022-09-03 ENCOUNTER — Ambulatory Visit (HOSPITAL_COMMUNITY)
Admission: RE | Admit: 2022-09-03 | Discharge: 2022-09-04 | Disposition: A | Discharge status: Home / Self Care | Payer: PPO | Attending: Cardiology | Admitting: Cardiology

## 2022-09-03 ENCOUNTER — Other Ambulatory Visit: Payer: Self-pay

## 2022-09-03 ENCOUNTER — Other Ambulatory Visit: Payer: Self-pay | Admitting: Internal Medicine

## 2022-09-03 ENCOUNTER — Encounter (HOSPITAL_COMMUNITY): Payer: Self-pay | Admitting: Cardiology

## 2022-09-03 ENCOUNTER — Encounter (HOSPITAL_COMMUNITY)
Admission: RE | Disposition: A | Discharge status: Home / Self Care | Payer: Self-pay | Source: Home / Self Care | Attending: Cardiology

## 2022-09-03 DIAGNOSIS — Z8719 Personal history of other diseases of the digestive system: Secondary | ICD-10-CM | POA: Diagnosis not present

## 2022-09-03 DIAGNOSIS — Z79899 Other long term (current) drug therapy: Secondary | ICD-10-CM | POA: Diagnosis not present

## 2022-09-03 DIAGNOSIS — E785 Hyperlipidemia, unspecified: Secondary | ICD-10-CM | POA: Diagnosis present

## 2022-09-03 DIAGNOSIS — Z7902 Long term (current) use of antithrombotics/antiplatelets: Secondary | ICD-10-CM | POA: Diagnosis not present

## 2022-09-03 DIAGNOSIS — N183 Chronic kidney disease, stage 3 unspecified: Secondary | ICD-10-CM | POA: Diagnosis not present

## 2022-09-03 DIAGNOSIS — Z7982 Long term (current) use of aspirin: Secondary | ICD-10-CM | POA: Insufficient documentation

## 2022-09-03 DIAGNOSIS — Z86010 Personal history of colonic polyps: Secondary | ICD-10-CM

## 2022-09-03 DIAGNOSIS — I10 Essential (primary) hypertension: Secondary | ICD-10-CM | POA: Diagnosis present

## 2022-09-03 DIAGNOSIS — I129 Hypertensive chronic kidney disease with stage 1 through stage 4 chronic kidney disease, or unspecified chronic kidney disease: Secondary | ICD-10-CM | POA: Diagnosis not present

## 2022-09-03 DIAGNOSIS — I25110 Atherosclerotic heart disease of native coronary artery with unstable angina pectoris: Secondary | ICD-10-CM | POA: Insufficient documentation

## 2022-09-03 DIAGNOSIS — Z955 Presence of coronary angioplasty implant and graft: Secondary | ICD-10-CM | POA: Insufficient documentation

## 2022-09-03 DIAGNOSIS — E7800 Pure hypercholesterolemia, unspecified: Secondary | ICD-10-CM | POA: Diagnosis present

## 2022-09-03 DIAGNOSIS — K2270 Barrett's esophagus without dysplasia: Secondary | ICD-10-CM

## 2022-09-03 DIAGNOSIS — G252 Other specified forms of tremor: Secondary | ICD-10-CM

## 2022-09-03 DIAGNOSIS — R001 Bradycardia, unspecified: Secondary | ICD-10-CM | POA: Diagnosis not present

## 2022-09-03 DIAGNOSIS — K219 Gastro-esophageal reflux disease without esophagitis: Secondary | ICD-10-CM

## 2022-09-03 HISTORY — PX: CORONARY STENT INTERVENTION: CATH118234

## 2022-09-03 HISTORY — PX: LEFT HEART CATH AND CORONARY ANGIOGRAPHY: CATH118249

## 2022-09-03 LAB — POCT ACTIVATED CLOTTING TIME
Activated Clotting Time: 239 seconds
Activated Clotting Time: 260 seconds
Activated Clotting Time: 314 seconds
Activated Clotting Time: 304 seconds

## 2022-09-03 SURGERY — LEFT HEART CATH AND CORONARY ANGIOGRAPHY
Anesthesia: LOCAL

## 2022-09-03 MED ORDER — VERAPAMIL HCL 2.5 MG/ML IV SOLN
INTRAVENOUS | Status: AC
  Filled 2022-09-03: qty 2

## 2022-09-03 MED ORDER — HYDRALAZINE HCL 20 MG/ML IJ SOLN: 10.0000 mg | INTRAMUSCULAR | Status: AC | PRN

## 2022-09-03 MED ORDER — NITROGLYCERIN 1 MG/10 ML FOR IR/CATH LAB
INTRA_ARTERIAL | Status: DC | PRN
  Administered 2022-09-03 (×2): 200 ug via INTRACORONARY

## 2022-09-03 MED ORDER — HEPARIN (PORCINE) IN NACL 1000-0.9 UT/500ML-% IV SOLN
INTRAVENOUS | Status: DC | PRN
  Administered 2022-09-03 (×2): 500 mL

## 2022-09-03 MED ORDER — ACETAMINOPHEN 325 MG PO TABS: 650.0000 mg | ORAL_TABLET | ORAL | Status: DC | PRN

## 2022-09-03 MED ORDER — VERAPAMIL HCL 2.5 MG/ML IV SOLN
INTRAVENOUS | Status: DC | PRN
  Administered 2022-09-03: 10 mL via INTRA_ARTERIAL

## 2022-09-03 MED ORDER — ISOSORBIDE MONONITRATE ER 30 MG PO TB24
30.0000 mg | ORAL_TABLET | Freq: Every day | ORAL | Status: DC
  Administered 2022-09-04: 30 mg via ORAL
  Filled 2022-09-03: qty 1

## 2022-09-03 MED ORDER — SODIUM CHLORIDE 0.9% FLUSH: 3.0000 mL | INTRAVENOUS | Status: DC | PRN

## 2022-09-03 MED ORDER — NITROGLYCERIN 1 MG/10 ML FOR IR/CATH LAB
INTRA_ARTERIAL | Status: AC
  Filled 2022-09-03: qty 10

## 2022-09-03 MED ORDER — SODIUM CHLORIDE 0.9% FLUSH
3.0000 mL | Freq: Two times a day (BID) | INTRAVENOUS | Status: DC
  Administered 2022-09-04: 3 mL via INTRAVENOUS

## 2022-09-03 MED ORDER — SODIUM CHLORIDE 0.9 % IV SOLN: 250.0000 mL | INTRAVENOUS | Status: DC | PRN

## 2022-09-03 MED ORDER — SODIUM CHLORIDE 0.9 % WEIGHT BASED INFUSION
1.0000 mL/kg/h | INTRAVENOUS | Status: AC
  Administered 2022-09-03: 1 mL/kg/h via INTRAVENOUS

## 2022-09-03 MED ORDER — CLOPIDOGREL BISULFATE 300 MG PO TABS
ORAL_TABLET | ORAL | Status: AC
  Filled 2022-09-03: qty 2

## 2022-09-03 MED ORDER — ASPIRIN 81 MG PO TBEC
81.0000 mg | DELAYED_RELEASE_TABLET | Freq: Every day | ORAL | Status: DC
  Administered 2022-09-04: 81 mg via ORAL
  Filled 2022-09-03: qty 1

## 2022-09-03 MED ORDER — FENTANYL CITRATE (PF) 100 MCG/2ML IJ SOLN
INTRAMUSCULAR | Status: AC
  Filled 2022-09-03: qty 2

## 2022-09-03 MED ORDER — CLOPIDOGREL BISULFATE 75 MG PO TABS
75.0000 mg | ORAL_TABLET | Freq: Every day | ORAL | Status: DC
  Administered 2022-09-04: 75 mg via ORAL
  Filled 2022-09-03: qty 1

## 2022-09-03 MED ORDER — HEPARIN SODIUM (PORCINE) 1000 UNIT/ML IJ SOLN
INTRAMUSCULAR | Status: DC | PRN
  Administered 2022-09-03 (×2): 4000 [IU] via INTRAVENOUS
  Administered 2022-09-03: 3000 [IU] via INTRAVENOUS
  Administered 2022-09-03: 2000 [IU] via INTRAVENOUS

## 2022-09-03 MED ORDER — IOHEXOL 350 MG/ML SOLN
INTRAVENOUS | Status: DC | PRN
  Administered 2022-09-03: 210 mL

## 2022-09-03 MED ORDER — POLYVINYL ALCOHOL 1.4 % OP SOLN
2.0000 [drp] | OPHTHALMIC | Status: DC
  Administered 2022-09-04: 2 [drp] via OPHTHALMIC
  Filled 2022-09-03: qty 15

## 2022-09-03 MED ORDER — SODIUM CHLORIDE 0.9 % WEIGHT BASED INFUSION: 1.0000 mL/kg/h | INTRAVENOUS | Status: DC

## 2022-09-03 MED ORDER — ONDANSETRON HCL 4 MG/2ML IJ SOLN: 4.0000 mg | Freq: Four times a day (QID) | INTRAMUSCULAR | Status: DC | PRN

## 2022-09-03 MED ORDER — SODIUM CHLORIDE 0.9% FLUSH: 3.0000 mL | Freq: Two times a day (BID) | INTRAVENOUS | Status: DC

## 2022-09-03 MED ORDER — HYDRALAZINE HCL 25 MG PO TABS
25.0000 mg | ORAL_TABLET | Freq: Two times a day (BID) | ORAL | Status: DC
  Administered 2022-09-03 – 2022-09-04 (×2): 25 mg via ORAL
  Filled 2022-09-03 (×2): qty 1

## 2022-09-03 MED ORDER — MIDAZOLAM HCL 2 MG/2ML IJ SOLN
INTRAMUSCULAR | Status: DC | PRN
  Administered 2022-09-03: 1 mg via INTRAVENOUS

## 2022-09-03 MED ORDER — AMLODIPINE BESYLATE 10 MG PO TABS
10.0000 mg | ORAL_TABLET | Freq: Every day | ORAL | Status: DC
  Administered 2022-09-03: 10 mg via ORAL
  Filled 2022-09-03: qty 1

## 2022-09-03 MED ORDER — ATORVASTATIN CALCIUM 10 MG PO TABS
20.0000 mg | ORAL_TABLET | Freq: Every day | ORAL | Status: DC
  Administered 2022-09-03: 20 mg via ORAL
  Filled 2022-09-03: qty 2

## 2022-09-03 MED ORDER — ATENOLOL 25 MG PO TABS
50.0000 mg | ORAL_TABLET | Freq: Two times a day (BID) | ORAL | Status: DC
  Administered 2022-09-03 – 2022-09-04 (×2): 50 mg via ORAL
  Filled 2022-09-03 (×2): qty 2

## 2022-09-03 MED ORDER — PANTOPRAZOLE SODIUM 40 MG PO TBEC
40.0000 mg | DELAYED_RELEASE_TABLET | Freq: Every morning | ORAL | Status: DC
  Administered 2022-09-04: 40 mg via ORAL
  Filled 2022-09-03: qty 1

## 2022-09-03 MED ORDER — LIDOCAINE HCL (PF) 1 % IJ SOLN
INTRAMUSCULAR | Status: AC
  Filled 2022-09-03: qty 30

## 2022-09-03 MED ORDER — PRIMIDONE 50 MG PO TABS
50.0000 mg | ORAL_TABLET | Freq: Every day | ORAL | Status: DC
  Administered 2022-09-03: 50 mg via ORAL
  Filled 2022-09-03: qty 1

## 2022-09-03 MED ORDER — SODIUM CHLORIDE 0.9 % IV SOLN: 500.0000 mL | Freq: Once | INTRAVENOUS | Status: DC

## 2022-09-03 MED ORDER — CLOPIDOGREL BISULFATE 300 MG PO TABS
ORAL_TABLET | ORAL | Status: DC | PRN
  Administered 2022-09-03: 600 mg via ORAL

## 2022-09-03 MED ORDER — MIDAZOLAM HCL 2 MG/2ML IJ SOLN
INTRAMUSCULAR | Status: AC
  Filled 2022-09-03: qty 2

## 2022-09-03 MED ORDER — HEPARIN SODIUM (PORCINE) 1000 UNIT/ML IJ SOLN
INTRAMUSCULAR | Status: AC
  Filled 2022-09-03: qty 10

## 2022-09-03 MED ORDER — FENTANYL CITRATE (PF) 100 MCG/2ML IJ SOLN
INTRAMUSCULAR | Status: DC | PRN
  Administered 2022-09-03: 25 ug via INTRAVENOUS

## 2022-09-03 MED ORDER — NITROGLYCERIN 0.4 MG SL SUBL: 0.4000 mg | SUBLINGUAL_TABLET | SUBLINGUAL | Status: DC | PRN

## 2022-09-03 MED ORDER — ASPIRIN 81 MG PO CHEW: 81.0000 mg | CHEWABLE_TABLET | ORAL | Status: DC

## 2022-09-03 MED ORDER — LISINOPRIL 20 MG PO TABS
20.0000 mg | ORAL_TABLET | Freq: Every day | ORAL | Status: DC
  Administered 2022-09-04: 20 mg via ORAL
  Filled 2022-09-03: qty 1

## 2022-09-03 MED ORDER — HEPARIN (PORCINE) IN NACL 1000-0.9 UT/500ML-% IV SOLN
INTRAVENOUS | Status: AC
  Filled 2022-09-03: qty 1000

## 2022-09-03 MED ORDER — SODIUM CHLORIDE 0.9 % WEIGHT BASED INFUSION
3.0000 mL/kg/h | INTRAVENOUS | Status: DC
  Administered 2022-09-03: 3 mL/kg/h via INTRAVENOUS

## 2022-09-03 MED ORDER — PROSIGHT PO TABS
1.0000 | ORAL_TABLET | Freq: Two times a day (BID) | ORAL | Status: DC
  Administered 2022-09-03 – 2022-09-04 (×2): 1 via ORAL
  Filled 2022-09-03 (×2): qty 1

## 2022-09-03 MED ORDER — LIDOCAINE HCL (PF) 1 % IJ SOLN
INTRAMUSCULAR | Status: DC | PRN
  Administered 2022-09-03: 2 mL

## 2022-09-03 MED ORDER — LABETALOL HCL 5 MG/ML IV SOLN: 10.0000 mg | INTRAVENOUS | Status: AC | PRN

## 2022-09-03 SURGICAL SUPPLY — 38 items
BALL SAPPHIRE NC24 2.75X15 (BALLOONS) ×2
BALLN EUPHORA RX 2.0X10 (BALLOONS) ×1
BALLN SAPPHIRE 2.0X10 (BALLOONS) ×1
BALLN SAPPHIRE 2.0X15 (BALLOONS) ×1
BALLN SAPPHIRE 2.5X15 (BALLOONS) ×2
BALLN SCOREFLEX 2.0X10 (BALLOONS)
BALLOON EUPHORA RX 2.0X10 (BALLOONS) IMPLANT
BALLOON SAPPHIRE 2.0X10 (BALLOONS) IMPLANT
BALLOON SAPPHIRE 2.0X15 (BALLOONS) IMPLANT
BALLOON SAPPHIRE 2.5X15 (BALLOONS) IMPLANT
BALLOON SAPPHIRE NC24 2.75X15 (BALLOONS) IMPLANT
BALLOON SCOREFLEX 2.0X10 (BALLOONS) IMPLANT
CATH INFINITI 5 FR 3DRC (CATHETERS) IMPLANT
CATH INFINITI JR4 5F (CATHETERS) IMPLANT
CATH LAUNCHER 5F RADR (CATHETERS) IMPLANT
CATH OPTITORQUE TIG 4.0 5F (CATHETERS) IMPLANT
CATH VISTA GUIDE 6FR XBLAD3.0 (CATHETERS) IMPLANT
CATH VISTA GUIDE 6FR XBLAD4 (CATHETERS) IMPLANT
CATHETER LAUNCHER 5F RADR (CATHETERS) ×1
DEVICE RAD COMP TR BAND LRG (VASCULAR PRODUCTS) IMPLANT
GLIDESHEATH SLEND SS 6F .021 (SHEATH) IMPLANT
GUIDEWIRE INQWIRE 1.5J.035X260 (WIRE) IMPLANT
INQWIRE 1.5J .035X260CM (WIRE) ×1
KIT ENCORE 26 ADVANTAGE (KITS) IMPLANT
KIT HEART LEFT (KITS) ×2 IMPLANT
PACK CARDIAC CATHETERIZATION (CUSTOM PROCEDURE TRAY) ×2 IMPLANT
PROTECTION STATION PRESSURIZED (MISCELLANEOUS) ×1
SHEATH PROBE COVER 6X72 (BAG) IMPLANT
STATION PROTECTION PRESSURIZED (MISCELLANEOUS) IMPLANT
STENT SYNERGY XD 2.25X12 (Permanent Stent) IMPLANT
STENT SYNERGY XD 2.50X28 (Permanent Stent) IMPLANT
SYNERGY XD 2.25X12 (Permanent Stent) ×1 IMPLANT
SYNERGY XD 2.50X28 (Permanent Stent) ×1 IMPLANT
TRANSDUCER W/STOPCOCK (MISCELLANEOUS) ×2 IMPLANT
TUBING CIL FLEX 10 FLL-RA (TUBING) ×2 IMPLANT
WIRE ASAHI PROWATER 180CM (WIRE) IMPLANT
WIRE HI TORQ BMW 190CM (WIRE) IMPLANT
WIRE RUNTHROUGH IZANAI 014 180 (WIRE) IMPLANT

## 2022-09-03 NOTE — Interval H&P Note (Signed)
History and Physical Interval Note:  09/03/2022 3:28 PM  [name]  has presented today for surgery, with the diagnosis of Unstable Angina.  The various methods of treatment have been discussed with the patient and family. After consideration of risks, benefits and other options for treatment, the patient has consented to  Procedure(s): LEFT HEART CATH AND CORONARY ANGIOGRAPHY (N/A)  PERCUTANEOUS CORONARY INTERVENTION  as a surgical intervention.  The patient's history has been reviewed, patient examined, no change in status, stable for surgery.  I have reviewed the patient's chart and labs.  Questions were answered to the patient's satisfaction.    Cath Lab Visit (complete for each Cath Lab visit)  Clinical Evaluation Leading to the Procedure:   ACS: Yes.    Non-ACS:    Anginal Classification: CCS IV  Anti-ischemic medical therapy: Maximal Therapy (2 or more classes of medications)  Non-Invasive Test Results: No non-invasive testing performed  Prior CABG: No previous CABG    Glenetta Hew

## 2022-09-03 NOTE — Brief Op Note (Signed)
BRIEF CARDIAC CATHETERIZATION & PERCUTANEOUS CORONARY INTERVENTION NOTE  09/03/2022  6:11 PM  PROCEDURE:  Procedure(s): LEFT HEART CATH AND CORONARY ANGIOGRAPHY (N/A) CORONARY STENT INTERVENTION (N/A)  [name]:  [name](s) and Role:    * [name], MD - Primary   PATIENT:  [name]  82 y.o. male referred for cardiac catheterization by Dr. [name] for concerns of unstable/progressive angina.  He has a history of known CAD with BMS PCI to the RCA (2002), followed by High Diagonal/Ramus DES PCI (2006).  The previous evening the patient went to the emergency room for evaluation of chest pain concerning for unstable angina.  He ruled out for MI and was referred back to primary cardiologist.  He was seen on 09/02/2022 by Dr. [name] who felt the symptoms were concerning for a Zio angina scheduled for urgent cardiac catheterization today.  Shared Decision Making/Informed Consent The risks [stroke (1 in 1000), death (1 in 1000), kidney failure [usually temporary] (1 in 500), bleeding (1 in 200), allergic reaction [possibly serious] (1 in 200)], benefits (diagnostic support and management of coronary artery disease) and alternatives of a cardiac catheterization were discussed in detail with [name] and he is willing to proceed.   PRE-OPERATIVE DIAGNOSIS:  Unstable Angina  POST-OPERATIVE DIAGNOSIS: CULPRIT LESION: Severe bifurcation LAD-D1 ([place] 1, 1, 1) 95% stenosis beginning just distal to Ramus Intermedius (RI) Successful complex bifurcation two-vessel PCI with Synergy 2.25 x 12 DES ostial proximal D1 and Synergy 2.5 mm x 28 mm deployed to 2.7 mm, postdilated proximally to 2.8 mm (unable to rewire the jailed D1 stent and successfully advance balloon across.  However there is TIMI-3 flow and appears to be widely patent.  I do not push too aggressively.. Patent proximal stent in the Ri with distal stent ISR roughly 65%. After discussion with patient's PCP, based on the  prolonged bifurcation PCI, we plan to treat the stenosis medically and bring him back to the Cath Lab if symptoms warrant. Widely patent RCA with focal 40% stenosis proximal to an 55% stenosis distal to the previous stent Apparently normal LVEF with mild to moderately elevated LVEDP.   ANESTHESIA:   local and IV sedation -1 mg Versed, 25 migraine fentanyl  EBL:  < 50 Ml   PROCEDURE PERFORMED   Time Out: Verified patient identification, verified procedure, site/side was marked, verified correct patient position, special equipment/implants available, medications/allergies/relevent history reviewed, required imaging and test results available. Performed.  Access:  RIGHT Radial Artery: 6 Fr sheath -- Seldinger technique using Micropuncture Kit -- Direct ultrasound guidance used.  Permanent image obtained and placed on chart. -- 10 mL radial cocktail IA; 4000 Units IV Heparin  Left Heart Catheterization: 5&6Fr Catheters advanced or exchanged over a J-wire under direct fluoroscopic guidance into the ascending aorta; TIG 4.0 catheter advanced first.  * LV Hemodynamics (LV Gram): TIG 4.0 * Left Coronary Artery Cineangiography:  TIG 4.0Catheter  * Right Coronary Artery Cineangiography: Unsuccessful engagement with TIG 4.0 catheter, JR4 catheter, 3 DRC catheter.  Finally successful with EZ Rad Right guide catheter using J-wire for support.  Review of initial angiography revealed: Severe bifurcation LAD-D1 stenosis is noted.  Also moderate stenosis in the ramus or medius stent.  Preparations are made for bifurcation PCI with plan for provisional stenting of the diagonal only. -Additional bolus of IV heparin administered to maintain and achieve an ACT greater than 250 seconds - 600 mg Plavix administered -IC NTG 200 mcg x 2  Complex bifurcation PCI of the  performed: XB LAD 3.0 guide catheter -> initially Prowater wire used for LAD and BMW used for D1 Predilation of the LAD with a 2.5 mm x 15 mm  balloon and of the D1 (PTCA performed because of a plaque shelf in the ostium) using 2.0 x 15 mm balloon at normal pressure. ->  After angioplasty of the ostial D1, there was an apparent dissection indicating that we needed to place a stent. A 2.25 mm x 12 mm Synergy DES was advanced into the D1 with a 2.5 x 15 mm balloon in the main branch.  The stent was deployed with intentions for Nano crush technique.  It was then crushed with the 2.5 mm balloon. Following crushing, a Synergy DES 2.5 mm x 20 mm was deployed to 2.7 mm, and postdilated with a 2.75 mm [place] balloon to 2.8 mm for POT (proximal optimization therapy) -> despite POT, I was not able to successfully wired the diagonal branch and advance a standard balloon across into the ostium.  I do not want to potentially disrupt the stents and therefore chose to postdilated the LAD stent again- in doing so, focal hazy density was no longer present in the ostium of the diagonal.  Upon completion of Angiogaphy, the catheter was removed completely out of the body over a wire, without complication.  Radial sheath removed in the Cardiac Catheterization lab with TR Band placed for hemostasis.  TR Band: 1750  Hours; 15 mL air  MEDICATIONS SQ Lidocaine 3 mL Radial Cocktail: 3 mg Verapmil in 10 mL NS Heparin: 13,000 Units IC NTG 200 mcg x 3   COUNTS:  YES  DICTATION: .Dragon Dictation  PLAN OF CARE: Admit for overnight observation  PATIENT DISPOSITION:  PACU - hemodynamically stable.;  DAPT uninterrupted for minimum 6 months, would continue Plavix long-term -Low threshold to consider PTCA/DES PCI of RI lesion.   Delay start of Pharmacological VTE agent (>24hrs) due to surgical blood loss or risk of bleeding: not applicable    [name], MD, MS Glenetta Hew, M.D., M.S. Interventional Cardiologist  [facility]  Pager # [phone] Phone # [phone] [address] [place], [place] [address]

## 2022-09-04 ENCOUNTER — Other Ambulatory Visit (HOSPITAL_COMMUNITY): Payer: Self-pay

## 2022-09-04 ENCOUNTER — Encounter (HOSPITAL_COMMUNITY): Payer: Self-pay | Admitting: Cardiology

## 2022-09-04 DIAGNOSIS — R001 Bradycardia, unspecified: Secondary | ICD-10-CM | POA: Diagnosis not present

## 2022-09-04 DIAGNOSIS — E785 Hyperlipidemia, unspecified: Secondary | ICD-10-CM | POA: Diagnosis not present

## 2022-09-04 DIAGNOSIS — Z79899 Other long term (current) drug therapy: Secondary | ICD-10-CM | POA: Diagnosis not present

## 2022-09-04 DIAGNOSIS — N183 Chronic kidney disease, stage 3 unspecified: Secondary | ICD-10-CM | POA: Diagnosis not present

## 2022-09-04 DIAGNOSIS — Z7982 Long term (current) use of aspirin: Secondary | ICD-10-CM | POA: Diagnosis not present

## 2022-09-04 DIAGNOSIS — I25110 Atherosclerotic heart disease of native coronary artery with unstable angina pectoris: Secondary | ICD-10-CM | POA: Diagnosis not present

## 2022-09-04 DIAGNOSIS — I25119 Atherosclerotic heart disease of native coronary artery with unspecified angina pectoris: Secondary | ICD-10-CM | POA: Diagnosis not present

## 2022-09-04 DIAGNOSIS — Z955 Presence of coronary angioplasty implant and graft: Secondary | ICD-10-CM | POA: Diagnosis not present

## 2022-09-04 DIAGNOSIS — I129 Hypertensive chronic kidney disease with stage 1 through stage 4 chronic kidney disease, or unspecified chronic kidney disease: Secondary | ICD-10-CM | POA: Diagnosis not present

## 2022-09-04 DIAGNOSIS — Z7902 Long term (current) use of antithrombotics/antiplatelets: Secondary | ICD-10-CM | POA: Diagnosis not present

## 2022-09-04 DIAGNOSIS — Z8719 Personal history of other diseases of the digestive system: Secondary | ICD-10-CM | POA: Diagnosis not present

## 2022-09-04 LAB — CBC
HCT: 36.6 % — ABNORMAL LOW (ref 39.0–52.0)
Hemoglobin: 11.8 g/dL — ABNORMAL LOW (ref 13.0–17.0)
MCH: 30.6 pg (ref 26.0–34.0)
MCHC: 32.2 g/dL (ref 30.0–36.0)
MCV: 95.1 fL (ref 80.0–100.0)
Platelets: 232 10*3/uL (ref 150–400)
RBC: 3.85 MIL/uL — ABNORMAL LOW (ref 4.22–5.81)
RDW: 13 % (ref 11.5–15.5)
WBC: 8.7 10*3/uL (ref 4.0–10.5)
nRBC: 0 % (ref 0.0–0.2)

## 2022-09-04 LAB — BASIC METABOLIC PANEL
Anion gap: 7 (ref 5–15)
BUN: 19 mg/dL (ref 8–23)
CO2: 25 mmol/L (ref 22–32)
Calcium: 9.2 mg/dL (ref 8.9–10.3)
Chloride: 107 mmol/L (ref 98–111)
Creatinine, Ser: 1.06 mg/dL (ref 0.61–1.24)
GFR, Estimated: >60 mL/min (ref >60)
Glucose, Bld: 77 mg/dL (ref 70–99)
Potassium: 3.8 mmol/L (ref 3.5–5.1)
Sodium: 139 mmol/L (ref 135–145)

## 2022-09-04 MED ORDER — ATORVASTATIN CALCIUM 40 MG PO TABS
40.0000 mg | ORAL_TABLET | Freq: Every day | ORAL | 0 refills | Status: DC
  Filled 2022-09-04: qty 30, 30d supply, fill #0

## 2022-09-04 MED ORDER — CLOPIDOGREL BISULFATE 75 MG PO TABS
75.0000 mg | ORAL_TABLET | Freq: Every day | ORAL | 2 refills | Status: DC
  Filled 2022-09-04: qty 90, 90d supply, fill #0

## 2022-09-04 MED ORDER — ATORVASTATIN CALCIUM 40 MG PO TABS: 40.0000 mg | ORAL_TABLET | Freq: Every day | ORAL | 3 refills | Status: DC

## 2022-09-04 MED ORDER — CLOPIDOGREL BISULFATE 75 MG PO TABS: 75.0000 mg | ORAL_TABLET | Freq: Every day | ORAL | 2 refills | Status: DC

## 2022-09-04 MED ORDER — ATORVASTATIN CALCIUM 40 MG PO TABS: 40.0000 mg | ORAL_TABLET | Freq: Every day | ORAL | Status: DC

## 2022-09-04 NOTE — Discharge Instructions (Signed)

## 2022-09-04 NOTE — Progress Notes (Signed)
CARDIAC REHAB PHASE I   PRE:  Rate/Rhythm: 60 SR  BP:  Sitting: 155/80      SaO2: 95 RA  MODE:  Ambulation: 470 ft   POST:  Rate/Rhythm: 62 SR  BP:  Sitting: 163/86      SaO2: 97 RA  Pt ambulated independently in hall, standby assist only. Returned to bed with call bell and bedside table in reach. Tolerated well with no CP or SOB. Post stent education including site care, antiplatelet therapy importance, risk factors, restrictions, heart healthy diet and CRP2 reviewed. All questions and concerns addressed. Will refer to [facility] for CRP2. Plan for possible discharge home today.   [id number]  Vanessa Barbara, RN BSN 09/04/2022 9:25 AM

## 2022-09-04 NOTE — Plan of Care (Signed)
  Problem: Education: Goal: Understanding of CV disease, CV risk reduction, and recovery process will improve Outcome: Completed/Met Goal: Individualized Educational Video(s) Outcome: Completed/Met   Problem: Activity: Goal: Ability to return to baseline activity level will improve Outcome: Completed/Met   Problem: Cardiovascular: Goal: Ability to achieve and maintain adequate cardiovascular perfusion will improve Outcome: Completed/Met Goal: Vascular access site(s) Level 0-1 will be maintained Outcome: Completed/Met   Problem: Health Behavior/Discharge Planning: Goal: Ability to safely manage health-related needs after discharge will improve Outcome: Completed/Met   Problem: Education: Goal: Knowledge of General Education information will improve Description: Including pain rating scale, medication(s)/side effects and non-pharmacologic comfort measures Outcome: Completed/Met   Problem: Health Behavior/Discharge Planning: Goal: Ability to manage health-related needs will improve Outcome: Completed/Met   Problem: Clinical Measurements: Goal: Ability to maintain clinical measurements within normal limits will improve Outcome: Completed/Met Goal: Will remain free from infection Outcome: Completed/Met Goal: Diagnostic test results will improve Outcome: Completed/Met Goal: Respiratory complications will improve Outcome: Completed/Met Goal: Cardiovascular complication will be avoided Outcome: Completed/Met   Problem: Activity: Goal: Risk for activity intolerance will decrease Outcome: Completed/Met   Problem: Nutrition: Goal: Adequate nutrition will be maintained Outcome: Completed/Met   Problem: Coping: Goal: Level of anxiety will decrease Outcome: Completed/Met   Problem: Elimination: Goal: Will not experience complications related to bowel motility Outcome: Completed/Met Goal: Will not experience complications related to urinary retention Outcome: Completed/Met   Problem: Pain Managment: Goal: General experience of comfort will improve Outcome: Completed/Met   Problem: Safety: Goal: Ability to remain free from injury will improve Outcome: Completed/Met   Problem: Skin Integrity: Goal: Risk for impaired skin integrity will decrease Outcome: Completed/Met   

## 2022-09-04 NOTE — Discharge Summary (Addendum)
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 1st Diag lesion is 55% stenosed distal to the bifurcation lesion.   A drug-eluting stent was successfully placed in the ostial and proximal  1st Diag (D1) using a SYNERGY XD 2.25X12.   -- Post intervention, the side branch was reduced to 0% residual stenosis.  (Covering the ostial lesion as well as the the 55% stenosis)   A drug-eluting stent was successfully placed in the proximal LAD from just beyond ramus intermedius to PRN major septal perforator distally.  Crosses to diagonal branches as noted.  Using a SYNERGY XD 2.50X28.  Deployed to 2.7 mm, postdilated proximally to 2.8 mm.   -- Post intervention, there is a 0% residual stenosis in the LAD.   --------------------------------------------   Ramus lesion is 65% stenosed -> distal in-stent restenosis, DES stent   Prox RCA to Mid RCA lesion is 40% stenosed.  Mid RCA bare-metal stent is 5% stenosed. Mid RCA to Dist RCA lesion is 55% stenosed.   1st Diag lesion is 55% stenosed.   ----------------------   The left ventricular systolic function is normal.  LV end diastolic pressure is normal.  There is no aortic valve stenosis.   POST-OPERATIVE DIAGNOSIS: CULPRIT LESION: Severe  bifurcation LAD-D1 ([place] 1, 1, 1) 95% stenosis beginning just distal to Ramus Intermedius (RI) Successful complex bifurcation two-vessel PCI with Synergy 2.25 x 12 DES ostial proximal D1 and Synergy 2.5 mm x 28 mm deployed to 2.7 mm, postdilated proximally to 2.8 mm (unable to rewire the jailed D1 stent and successfully advance balloon across.  However there is TIMI-3 flow and appears to be widely patent.  I do not push too aggressively.. Patent proximal stent in the Ri with distal stent ISR roughly 65%. After discussion with patient's PCP, based on the prolonged bifurcation PCI, we plan to treat the stenosis medically and bring him back to the Cath Lab if symptoms warrant. Widely patent RCA with focal 40% stenosis proximal to an 55% stenosis distal to the previous stent Apparently normal LVEF with mild to moderately elevated LVEDP.   PLAN OF CARE: Admit for overnight observation; DAPT uninterrupted for minimum 6 months, would continue Plavix long-term -Low threshold to consider PTCA/DES PCI of RI lesion.   PATIENT DISPOSITION:  PACU - hemodynamically stable.;     Diagnostic Dominance: Right  Intervention   _____________   History of Present Illness     [name] is a 82 y.o. male with past medical history of CAD s/p remote stenting of RCA, diagonal, hypertension, hyperlipidemia, Barrett's esophagus, and CKD stage III who recently presented to the office 1/9 with complaints of chest pain and dyspnea.  Had a recent ED visit with chest pain with exertion.  Enzymes  were negative with no acute EKG changes.  When his symptoms being concerning for unstable angina he was placed on Imdur and set up for outpatient cardiac catheterization.  Hospital Course     CAD -- Outpatient cardiac catheterization noted above with severe bifurcation LAD-D1 of 95% stenosis treated with two-vessel PCI/DES x 1 to proximal D1/pLAD, patent proximal stent in the RI with distal stent in-stent restenosis of  65%.  Consider DAPT with aspirin/Plavix for at least 6 months, continue Plavix long-term.  Noted low threshold to consider PTCA/DES PCI of RI lesion -- Continue aspirin, Plavix, beta-blocker, statin, hydralazine/Imdur  Hyperlipidemia -- increase atorvastatin from 20->'40mg'$  daily  -- FLP/LFTs in 8 weeks   Hypertension --Continue Norvasc 10 mg daily, atenolol 50 mg twice daily, hydralazine 25 mg 3 times daily, Imdur 30 mg daily, lisinopril 20 mg daily  Barrett's esophagus -- Recent EGD 07/2022 no evidence of dysplasia or carcinoma  General: Well developed, well nourished, male appearing in no acute distress. Head: Normocephalic, atraumatic.  Neck: Supple without bruits, JVD. Lungs:  Resp regular and unlabored, CTA. Heart: RRR, S1, S2, no S3, S4, or murmur; no rub. Abdomen: Soft, non-tender, non-distended with normoactive bowel sounds. No hepatomegaly. No rebound/guarding. No obvious abdominal masses. Extremities: No clubbing, cyanosis, edema. Distal pedal pulses are 2+ bilaterally. Right radial cath site stable without bruising or hematoma Neuro: Alert and oriented X 3. Moves all extremities spontaneously. Psych: Normal affect.  Patient was seen by Dr. Burt Knack and deemed stable for discharge. Follow up arranged in the office. Medications sent to [place] prior to DC. Educated by PharmD  Did the patient have an acute coronary syndrome (MI, NSTEMI, STEMI, etc) this admission?:  No                               Did the patient have a percutaneous coronary intervention (stent / angioplasty)?:  Yes.     Cath/PCI Registry Performance & Quality Measures: Aspirin prescribed? - Yes ADP Receptor Inhibitor (Plavix/Clopidogrel, Brilinta/Ticagrelor or Effient/Prasugrel) prescribed (includes medically managed patients)? - Yes High Intensity Statin (Lipitor 40-'80mg'$  or Crestor 20-'40mg'$ ) prescribed? - Yes For EF <40%, was ACEI/ARB prescribed? - Not Applicable (EF >/= 97%) For EF <40%, Aldosterone  Antagonist (Spironolactone or Eplerenone) prescribed? - Not Applicable (EF >/= 98%) Cardiac Rehab Phase II ordered? - Yes       The patient will be scheduled for a TOC follow up appointment in 10-14 days.  A message has been sent to the [facility] and Scheduling Pool at the office where the patient should be seen for follow up.  _____________  Discharge Vitals Blood pressure (!) 151/87, pulse (!) 59, temperature 97.8 F (36.6 C), temperature source Oral, resp. rate 16, height '5\' 6"'$  (1.676 m), weight 78.9 kg, SpO2 95 %.  Filed Weights   09/03/22 1051 09/03/22 2137  Weight: 77.1 kg 78.9 kg    Labs & Radiologic Studies    CBC Recent Labs    09/01/22 1811 09/04/22 0150  WBC 7.8 8.7  HGB 13.2 11.8*  HCT 40.8 36.6*  MCV 96.5 95.1  PLT 253 921   Basic Metabolic Panel Recent Labs    09/01/22 1811 09/04/22 0150  NA 137 139  K 3.9 3.8  CL 103 107  CO2 28 25  GLUCOSE 94 77  BUN 18 19  CREATININE 1.17 1.06  CALCIUM 9.5 9.2   Liver Function Tests No results for input(s): "AST", "ALT", "ALKPHOS", "  BILITOT", "PROT", "ALBUMIN" in the last 72 hours. No results for input(s): "LIPASE", "AMYLASE" in the last 72 hours. High Sensitivity Troponin:   Recent Labs  Lab 09/01/22 1811 09/01/22 2200  TROPONINIHS 11 10    BNP Invalid input(s): "POCBNP" D-Dimer No results for input(s): "DDIMER" in the last 72 hours. Hemoglobin A1C No results for input(s): "HGBA1C" in the last 72 hours. Fasting Lipid Panel No results for input(s): "CHOL", "HDL", "LDLCALC", "TRIG", "CHOLHDL", "LDLDIRECT" in the last 72 hours. Thyroid Function Tests No results for input(s): "TSH", "T4TOTAL", "T3FREE", "THYROIDAB" in the last 72 hours.  Invalid input(s): "FREET3" _____________  CARDIAC CATHETERIZATION  Result Date: 09/03/2022   Prox LAD lesion is 95% stenosed with 95% stenosed side branch in 1st Diag. 1st Diag lesion is 55% stenosed distal to the bifurcation lesion.   A drug-eluting stent was  successfully placed in the ostial and proximal  1st Diag (D1) using a SYNERGY XD 2.25X12.   -- Post intervention, the side branch was reduced to 0% residual stenosis.  (Covering the ostial lesion as well as the the 55% stenosis)   A drug-eluting stent was successfully placed in the proximal LAD from just beyond ramus intermedius to PRN major septal perforator distally.  Crosses to diagonal branches as noted.  Using a SYNERGY XD 2.50X28.  Deployed to 2.7 mm, postdilated proximally to 2.8 mm.   -- Post intervention, there is a 0% residual stenosis in the LAD.   --------------------------------------------   Ramus lesion is 65% stenosed -> distal in-stent restenosis, DES stent   Prox RCA to Mid RCA lesion is 40% stenosed.  Mid RCA bare-metal stent is 5% stenosed. Mid RCA to Dist RCA lesion is 55% stenosed.   1st Diag lesion is 55% stenosed.   ----------------------   The left ventricular systolic function is normal.  LV end diastolic pressure is normal.  There is no aortic valve stenosis. POST-OPERATIVE DIAGNOSIS: CULPRIT LESION: Severe bifurcation LAD-D1 ([place] 1, 1, 1) 95% stenosis beginning just distal to Ramus Intermedius (RI) Successful complex bifurcation two-vessel PCI with Synergy 2.25 x 12 DES ostial proximal D1 and Synergy 2.5 mm x 28 mm deployed to 2.7 mm, postdilated proximally to 2.8 mm (unable to rewire the jailed D1 stent and successfully advance balloon across.  However there is TIMI-3 flow and appears to be widely patent.  I do not push too aggressively.. Patent proximal stent in the Ri with distal stent ISR roughly 65%. After discussion with patient's PCP, based on the prolonged bifurcation PCI, we plan to treat the stenosis medically and bring him back to the Cath Lab if symptoms warrant. Widely patent RCA with focal 40% stenosis proximal to an 55% stenosis distal to the previous stent Apparently normal LVEF with mild to moderately elevated LVEDP. PLAN OF CARE: Admit for overnight observation; DAPT  uninterrupted for minimum 6 months, would continue Plavix long-term -Low threshold to consider PTCA/DES PCI of RI lesion.  PATIENT DISPOSITION:  PACU - hemodynamically stable.;    CT Chest W Contrast  Result Date: 09/01/2022 CLINICAL DATA:  Chest pain EXAM: CT CHEST WITH CONTRAST TECHNIQUE: Multidetector CT imaging of the chest was performed during intravenous contrast administration. RADIATION DOSE REDUCTION: This exam was performed according to the departmental dose-optimization program which includes automated exposure control, adjustment of the mA and/or kV according to patient size and/or use of iterative reconstruction technique. CONTRAST:  47m OMNIPAQUE IOHEXOL 350 MG/ML SOLN COMPARISON:  Chest x-ray 09/01/2022. FINDINGS: Cardiovascular: No significant vascular findings. Normal heart size. No  pericardial effusion. There are atherosclerotic calcifications of the coronary arteries. Mediastinum/Nodes: No enlarged mediastinal, hilar, or axillary lymph nodes. Thyroid gland, trachea, and esophagus demonstrate no significant findings. Lungs/Pleura: Lungs are clear. No pleural effusion or pneumothorax. Upper Abdomen: There is a large hiatal hernia containing the proximal stomach. 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 There is a calcified granuloma in the liver. Musculoskeletal: Degenerative changes affect the spine. Lower cervical spinal fusion plate is present. IMPRESSION: 1. No acute cardiopulmonary process. 2. Large hiatal hernia. Electronically Signed   By: Ronney Asters M.D.   On: 09/01/2022 23:50   CT Head Wo Contrast  Result Date: 09/01/2022 CLINICAL DATA:  Headache, new onset. EXAM: CT HEAD WITHOUT CONTRAST TECHNIQUE: Contiguous axial images were obtained from the base of the skull through the vertex without intravenous contrast. RADIATION DOSE REDUCTION: This exam was performed according to the departmental dose-optimization program which includes automated exposure control, adjustment of the mA and/or kV  according to patient size and/or use of iterative reconstruction technique. COMPARISON:  None Available. FINDINGS: Brain: No evidence of acute infarction, hemorrhage, hydrocephalus, extra-axial collection or mass lesion/mass effect. Mild cerebral atrophy. Patchy area of low-attenuation white matter presumed chronic microvascular ischemic changes. Vascular: No hyperdense vessel or unexpected calcification. Skull: Normal. Negative for fracture or focal lesion. Sinuses/Orbits: Mucous retention cyst in the right maxillary sinus. Other: None. IMPRESSION: 1. No acute intracranial abnormality. 2. Atrophy and chronic microvascular ischemic changes of the white matter. Electronically Signed   By: Keane Police D.O.   On: 09/01/2022 23:45   DG Chest 2 View  Result Date: 09/01/2022 CLINICAL DATA:  Chest pain worse with exertion. EXAM: CHEST - 2 VIEW COMPARISON:  Chest radiograph 02/06/2016 FINDINGS: The cardiomediastinal silhouette is normal. There is masslike opacity in the medial left lower lobe seen on the frontal projection, new since 2017. There is no other focal airspace opacity. There is no pulmonary edema. There is no pleural effusion or pneumothorax. There is no acute osseous abnormality. Cervical spine fusion hardware is noted. IMPRESSION: Masslike opacity in the medial left lower lobe seen on the frontal projection is new since 2017. Recommend CT chest for further evaluation. Electronically Signed   By: Valetta Mole M.D.   On: 09/01/2022 18:49   Disposition   Pt is being discharged home today in good condition.  Follow-up Plans & Appointments     Follow-up Information     [name], [place] Follow up on 09/16/2022.   Specialties: Cardiology, Radiology Why: at 10:55am for your follow up appt with Dr. Cherlyn Cushing' [place] information: [address] [place] [address] [phone]                Discharge Instructions     Amb Referral to Cardiac Rehabilitation    Complete by: As directed    Diagnosis: Coronary Stents   After initial evaluation and assessments completed: Virtual Based Care may be provided alone or in conjunction with Phase 2 Cardiac Rehab based on patient barriers.: Yes   Intensive Cardiac Rehabilitation (ICR) [place] location only OR Traditional Cardiac Rehabilitation (TCR) *If criteria for ICR are not met will enroll in TCR [facility] only): Yes   Call MD for:  difficulty breathing, headache or visual disturbances   Complete by: As directed    Call MD for:  persistant dizziness or light-headedness   Complete by: As directed    Call MD for:  redness, tenderness, or signs of infection (pain, swelling, redness, odor or green/yellow discharge around  incision site)   Complete by: As directed    Diet - low sodium heart healthy   Complete by: As directed    Discharge instructions   Complete by: As directed    Radial Site Care Refer to this sheet in the next few weeks. These instructions provide you with information on caring for yourself after your procedure. Your caregiver may also give you more specific instructions. Your treatment has been planned according to current medical practices, but problems sometimes occur. Call your caregiver if you have any problems or questions after your procedure. HOME CARE INSTRUCTIONS You may shower the day after the procedure. Remove the bandage (dressing) and gently wash the site with plain soap and water. Gently pat the site dry.  Do not apply powder or lotion to the site.  Do not submerge the affected site in water for 3 to 5 days.  Inspect the site at least twice daily.  Do not flex or bend the affected arm for 24 hours.  No lifting over 5 pounds (2.3 kg) for 5 days after your procedure.  Do not drive home if you are discharged the same day of the procedure. Have someone else drive you.  You may drive 24 hours after the procedure unless otherwise instructed by your caregiver.  What to expect: Any bruising  will usually fade within 1 to 2 weeks.  Blood that collects in the tissue (hematoma) may be painful to the touch. It should usually decrease in size and tenderness within 1 to 2 weeks.  SEEK IMMEDIATE MEDICAL CARE IF: You have unusual pain at the radial site.  You have redness, warmth, swelling, or pain at the radial site.  You have drainage (other than a small amount of blood on the dressing).  You have chills.  You have a fever or persistent symptoms for more than 72 hours.  You have a fever and your symptoms suddenly get worse.  Your arm becomes pale, cool, tingly, or numb.  You have heavy bleeding from the site. Hold pressure on the site.   PLEASE DO NOT MISS ANY DOSES OF YOUR PLAVIX!!!!! Also keep a log of you blood pressures and bring back to your follow up appt. Please call the office with any questions.   Patients taking blood thinners should generally stay away from medicines like ibuprofen, Advil, Motrin, naproxen, and Aleve due to risk of stomach bleeding. You may take Tylenol as directed or talk to your primary doctor about alternatives.   PLEASE ENSURE THAT YOU DO NOT RUN OUT OF YOUR PLAVIX. This medication is very important to remain on for at least one year. IF you have issues obtaining this medication due to cost please CALL the office 3-5 business days prior to running out in order to prevent missing doses of this medication.   Increase activity slowly   Complete by: As directed         Discharge Medications   Allergies as of 09/04/2022   No Known Allergies      Medication List     TAKE these medications    amLODipine 10 MG tablet Commonly known as: NORVASC TAKE ONE TABLET BY MOUTH EVERYDAY AT BEDTIME   aspirin EC 81 MG tablet Take 81 mg by mouth daily. Swallow whole.   atenolol 50 MG tablet Commonly known as: TENORMIN TAKE ONE TABLET BY MOUTH EVERY MORNING and TAKE ONE TABLET BY MOUTH EVERYDAY AT BEDTIME   atorvastatin 40 MG tablet Commonly known as:  LIPITOR Take  1 tablet (40 mg total) by mouth at bedtime. What changed:  medication strength how much to take how to take this when to take this additional instructions   CENTRUM SILVER 50+MEN PO Take 1 tablet by mouth.   PreserVision AREDS 2 Caps Take 1 capsule by mouth 2 (two) times daily.   clopidogrel 75 MG tablet Commonly known as: PLAVIX Take 1 tablet (75 mg total) by mouth daily with breakfast. Start taking on: September 05, 2022   CO Q 10 PO Take 1 tablet by mouth daily.   fluocinonide-emollient 0.05 % cream Commonly known as: LIDEX-E Apply 1 Application topically 2 (two) times daily. What changed: additional instructions   hydrALAZINE 25 MG tablet Commonly known as: APRESOLINE TAKE ONE TABLET BY MOUTH EVERY MORNING and TAKE ONE TABLET BY MOUTH EVERYDAY AT BEDTIME   isosorbide mononitrate 30 MG 24 hr tablet Commonly known as: IMDUR Take 1 tablet (30 mg total) by mouth daily.   KRILL OIL PO Take 100 mg by mouth daily. Mega Red   lisinopril 20 MG tablet Commonly known as: ZESTRIL TAKE ONE TABLET BY MOUTH EVERYDAY AT BEDTIME   nitroGLYCERIN 0.4 MG SL tablet Commonly known as: NITROSTAT DISSOLVE 1 TABLET UNDER THE TONGUE EVERY 5 MINUTES AS NEEDED FOR CHEST PAIN. DO NOT EXCEED A TOTAL OF 3 DOSES IN 15 MINUTES.   pantoprazole 40 MG tablet Commonly known as: PROTONIX TAKE ONE TABLET BY MOUTH EVERY MORNING   primidone 50 MG tablet Commonly known as: MYSOLINE TAKE ONE TABLET BY MOUTH EVERY MORNING and TAKE ONE TABLET BY MOUTH EVERYDAY AT BEDTIME What changed: additional instructions   Systane 0.4-0.3 % Soln Generic drug: Polyethyl Glycol-Propyl Glycol Place 2 drops into both eyes every morning.          Outstanding Labs/Studies   FLP/LFTs in 8 weeks   Duration of Discharge Encounter   Greater than 30 minutes including physician time.  Signed, Reino Bellis, NP 09/04/2022, 10:33 AM  Patient seen, examined. Available data reviewed. Agree with  findings, assessment, and plan as outlined by Reino Bellis, NP.  The patient is independently interviewed and examined.  He is alert, oriented, no distress.  HEENT is normal, lungs are clear bilaterally, heart is regular rate and rhythm no murmur gallop, right radial cath site is clear with no hematoma or ecchymosis, abdomen is soft and nontender, extremities have no edema.  Cardiac catheterization films were reviewed with Dr. [name] yesterday at the time of the procedure.  The patient underwent successful complex bifurcation stenting of the LAD first diagonal.  I agree with medical therapy for his residual CAD.  I agree with long-term DAPT with aspirin and clopidogrel.  The patient is medically stable for hospital discharge today.  Otherwise as outlined above regarding his discharge medications and follow-up plans.  Sherren Mocha, M.D. 09/04/2022 11:18 AM

## 2022-09-05 LAB — LIPOPROTEIN A (LPA): Lipoprotein (a): 114.3 nmol/L — ABNORMAL HIGH (ref <75.0)

## 2022-09-08 ENCOUNTER — Other Ambulatory Visit: Payer: Self-pay

## 2022-09-08 ENCOUNTER — Encounter (HOSPITAL_BASED_OUTPATIENT_CLINIC_OR_DEPARTMENT_OTHER): Payer: Self-pay | Admitting: Emergency Medicine

## 2022-09-08 ENCOUNTER — Emergency Department (HOSPITAL_BASED_OUTPATIENT_CLINIC_OR_DEPARTMENT_OTHER)
Admission: EM | Admit: 2022-09-08 | Discharge: 2022-09-09 | Disposition: A | Discharge status: Home / Self Care | Payer: PPO | Attending: Emergency Medicine | Admitting: Emergency Medicine

## 2022-09-08 ENCOUNTER — Emergency Department (HOSPITAL_BASED_OUTPATIENT_CLINIC_OR_DEPARTMENT_OTHER): Payer: PPO

## 2022-09-08 ENCOUNTER — Other Ambulatory Visit (HOSPITAL_COMMUNITY): Payer: Self-pay

## 2022-09-08 DIAGNOSIS — R109 Unspecified abdominal pain: Secondary | ICD-10-CM | POA: Diagnosis present

## 2022-09-08 DIAGNOSIS — R3129 Other microscopic hematuria: Secondary | ICD-10-CM | POA: Diagnosis not present

## 2022-09-08 DIAGNOSIS — I2510 Atherosclerotic heart disease of native coronary artery without angina pectoris: Secondary | ICD-10-CM | POA: Insufficient documentation

## 2022-09-08 DIAGNOSIS — Z7982 Long term (current) use of aspirin: Secondary | ICD-10-CM | POA: Insufficient documentation

## 2022-09-08 DIAGNOSIS — Z7902 Long term (current) use of antithrombotics/antiplatelets: Secondary | ICD-10-CM | POA: Diagnosis not present

## 2022-09-08 DIAGNOSIS — M545 Low back pain, unspecified: Secondary | ICD-10-CM | POA: Diagnosis not present

## 2022-09-08 DIAGNOSIS — K449 Diaphragmatic hernia without obstruction or gangrene: Secondary | ICD-10-CM | POA: Diagnosis not present

## 2022-09-08 DIAGNOSIS — N200 Calculus of kidney: Secondary | ICD-10-CM | POA: Diagnosis not present

## 2022-09-08 DIAGNOSIS — Z79899 Other long term (current) drug therapy: Secondary | ICD-10-CM | POA: Insufficient documentation

## 2022-09-08 DIAGNOSIS — D649 Anemia, unspecified: Secondary | ICD-10-CM | POA: Insufficient documentation

## 2022-09-08 DIAGNOSIS — N281 Cyst of kidney, acquired: Secondary | ICD-10-CM | POA: Diagnosis not present

## 2022-09-08 MED ORDER — LIDOCAINE 5 % EX PTCH
2.0000 | MEDICATED_PATCH | CUTANEOUS | Status: DC
  Administered 2022-09-08: 2 via TRANSDERMAL
  Filled 2022-09-08: qty 2

## 2022-09-08 NOTE — ED Triage Notes (Signed)
Left side flank/lower back pain. Started today becoming severe.

## 2022-09-08 NOTE — ED Notes (Signed)
Patient transported to CT 

## 2022-09-09 ENCOUNTER — Telehealth: Payer: Self-pay | Admitting: Internal Medicine

## 2022-09-09 LAB — URINALYSIS, ROUTINE W REFLEX MICROSCOPIC
Bilirubin Urine: NEGATIVE
Glucose, UA: NEGATIVE mg/dL
Ketones, ur: NEGATIVE mg/dL
Leukocytes,Ua: NEGATIVE
Nitrite: NEGATIVE
Specific Gravity, Urine: 1.03 (ref 1.005–1.030)
pH: 5.5 (ref 5.0–8.0)

## 2022-09-09 LAB — CBC WITH DIFFERENTIAL/PLATELET
Abs Immature Granulocytes: 0.04 10*3/uL (ref 0.00–0.07)
Basophils Absolute: 0.1 10*3/uL (ref 0.0–0.1)
Basophils Relative: 1 %
Eosinophils Absolute: 0.2 10*3/uL (ref 0.0–0.5)
Eosinophils Relative: 2 %
HCT: 38.8 % — ABNORMAL LOW (ref 39.0–52.0)
Hemoglobin: 12.9 g/dL — ABNORMAL LOW (ref 13.0–17.0)
Immature Granulocytes: 0 %
Lymphocytes Relative: 26 %
Lymphs Abs: 2.4 10*3/uL (ref 0.7–4.0)
MCH: 31.2 pg (ref 26.0–34.0)
MCHC: 33.2 g/dL (ref 30.0–36.0)
MCV: 93.9 fL (ref 80.0–100.0)
Monocytes Absolute: 1.4 10*3/uL — ABNORMAL HIGH (ref 0.1–1.0)
Monocytes Relative: 15 %
Neutro Abs: 5.2 10*3/uL (ref 1.7–7.7)
Neutrophils Relative %: 56 %
Platelets: 251 10*3/uL (ref 150–400)
RBC: 4.13 MIL/uL — ABNORMAL LOW (ref 4.22–5.81)
RDW: 13 % (ref 11.5–15.5)
WBC: 9.4 10*3/uL (ref 4.0–10.5)
nRBC: 0 % (ref 0.0–0.2)

## 2022-09-09 LAB — BASIC METABOLIC PANEL
Anion gap: 9 (ref 5–15)
BUN: 24 mg/dL — ABNORMAL HIGH (ref 8–23)
CO2: 28 mmol/L (ref 22–32)
Calcium: 10 mg/dL (ref 8.9–10.3)
Chloride: 102 mmol/L (ref 98–111)
Creatinine, Ser: 1.22 mg/dL (ref 0.61–1.24)
GFR, Estimated: 59 mL/min — ABNORMAL LOW (ref >60)
Glucose, Bld: 100 mg/dL — ABNORMAL HIGH (ref 70–99)
Potassium: 4.1 mmol/L (ref 3.5–5.1)
Sodium: 139 mmol/L (ref 135–145)

## 2022-09-09 MED ORDER — FOSFOMYCIN TROMETHAMINE 3 G PO PACK
3.0000 g | PACK | Freq: Once | ORAL | Status: AC
  Administered 2022-09-09: 3 g via ORAL
  Filled 2022-09-09: qty 3

## 2022-09-09 MED ORDER — LIDOCAINE 5 % EX PTCH: 1.0000 | MEDICATED_PATCH | CUTANEOUS | 0 refills | Status: DC

## 2022-09-09 NOTE — ED Provider Notes (Signed)
[facility] EMERGENCY DEPT Provider Note   CSN: [id number] Arrival date & time: 09/08/22  2301     History  Chief Complaint  Patient presents with   Flank Pain    [name] is a 82 y.o. male.  The history is provided by the patient and the spouse.  Flank Pain This is a new problem. The current episode started 12 to 24 hours ago. The problem occurs constantly. The problem has not changed since onset.Pertinent negatives include no chest pain, no abdominal pain, no headaches and no shortness of breath. Nothing aggravates the symptoms. Nothing relieves the symptoms. He has tried acetaminophen for the symptoms. Improvement on treatment: took just prior to arrival.  Patient with CAD presents with lower left back pain just above the pelvic brim that started earlier in the day.  No fevers, no vomiting no diarrhea.  No urinary symptoms.       Home Medications Prior to Admission medications   Medication Sig Start Date End Date Taking? Authorizing Provider  amLODipine (NORVASC) 10 MG tablet TAKE ONE TABLET BY MOUTH EVERYDAY AT BEDTIME 05/19/22   Minus Breeding, MD  aspirin EC 81 MG tablet Take 81 mg by mouth daily. Swallow whole.    [provider]  atenolol (TENORMIN) 50 MG tablet TAKE ONE TABLET BY MOUTH EVERY MORNING and TAKE ONE TABLET BY MOUTH EVERYDAY AT BEDTIME 05/19/22   Minus Breeding, MD  atorvastatin (LIPITOR) 40 MG tablet Take 1 tablet (40 mg total) by mouth at bedtime. 09/04/22   Cheryln Manly, NP  clopidogrel (PLAVIX) 75 MG tablet Take 1 tablet (75 mg total) by mouth daily with breakfast. 09/05/22   Reino Bellis B, NP  Coenzyme Q10 (CO Q 10 PO) Take 1 tablet by mouth daily.    [provider]  fluocinonide-emollient (LIDEX-E) 0.05 % cream Apply 1 Application topically 2 (two) times daily. Patient taking differently: Apply 1 Application topically 2 (two) times daily. On hand 08/13/22   [name], MD  hydrALAZINE (APRESOLINE) 25  MG tablet TAKE ONE TABLET BY MOUTH EVERY MORNING and TAKE ONE TABLET BY MOUTH EVERYDAY AT BEDTIME 05/19/22   Minus Breeding, MD  isosorbide mononitrate (IMDUR) 30 MG 24 hr tablet Take 1 tablet (30 mg total) by mouth daily. 09/02/22   Minus Breeding, MD  KRILL OIL PO Take 100 mg by mouth daily. Mega Red    [provider]  lisinopril (ZESTRIL) 20 MG tablet TAKE ONE TABLET BY MOUTH EVERYDAY AT BEDTIME 05/19/22   Minus Breeding, MD  Multiple Vitamins-Minerals (CENTRUM SILVER 50+MEN PO) Take 1 tablet by mouth.    [provider]  Multiple Vitamins-Minerals (PRESERVISION AREDS 2) CAPS Take 1 capsule by mouth 2 (two) times daily.    [provider]  nitroGLYCERIN (NITROSTAT) 0.4 MG SL tablet DISSOLVE 1 TABLET UNDER THE TONGUE EVERY 5 MINUTES AS NEEDED FOR CHEST PAIN. DO NOT EXCEED A TOTAL OF 3 DOSES IN 15 MINUTES. 05/19/22   Minus Breeding, MD  pantoprazole (PROTONIX) 40 MG tablet TAKE ONE TABLET BY MOUTH EVERY MORNING 09/03/22   [name], MD  Polyethyl Glycol-Propyl Glycol (SYSTANE) 0.4-0.3 % SOLN Place 2 drops into both eyes every morning.    [provider]  primidone (MYSOLINE) 50 MG tablet TAKE ONE TABLET BY MOUTH EVERY MORNING and TAKE ONE TABLET BY MOUTH EVERYDAY AT BEDTIME 09/03/22   [name], MD      Allergies    Patient has no known allergies.  Review of Systems   Review of Systems  Constitutional:  Negative for fever.  HENT:  Negative for facial swelling.   Respiratory:  Negative for shortness of breath.   Cardiovascular:  Negative for chest pain.  Gastrointestinal:  Negative for abdominal pain.  Genitourinary:  Positive for flank pain. Negative for dysuria.  Neurological:  Negative for headaches.  All other systems reviewed and are negative.   Physical Exam Updated Vital Signs BP (!) 165/81 (BP Location: Right Arm)   Pulse 63   Temp 98.4 F (36.9 C) (Oral)   Resp 18   SpO2 94%  Physical Exam Vitals and nursing note reviewed.  Exam conducted with a chaperone present.  Constitutional:      General: He is not in acute distress.    Appearance: He is well-developed. He is not diaphoretic.  HENT:     Head: Normocephalic and atraumatic.     Nose: Nose normal.  Eyes:     Conjunctiva/sclera: Conjunctivae normal.     Pupils: Pupils are equal, round, and reactive to light.  Cardiovascular:     Rate and Rhythm: Normal rate and regular rhythm.  Pulmonary:     Effort: Pulmonary effort is normal.     Breath sounds: Normal breath sounds. No wheezing or rales.  Abdominal:     General: Bowel sounds are normal.     Palpations: Abdomen is soft.     Tenderness: There is no abdominal tenderness. There is no guarding or rebound.  Musculoskeletal:        General: Normal range of motion.     Cervical back: Normal range of motion and neck supple.  Skin:    General: Skin is warm and dry.     Capillary Refill: Capillary refill takes less than 2 seconds.  Neurological:     General: No focal deficit present.     Mental Status: He is alert and oriented to person, place, and time.     Deep Tendon Reflexes: Reflexes normal.  Psychiatric:        Mood and Affect: Mood normal.        Behavior: Behavior normal.     ED Results / Procedures / Treatments   Labs (all labs ordered are listed, but only abnormal results are displayed) Results for orders placed or performed during the hospital encounter of 09/08/22  CBC with Differential  Result Value Ref Range   WBC 9.4 4.0 - 10.5 K/uL   RBC 4.13 (L) 4.22 - 5.81 MIL/uL   Hemoglobin 12.9 (L) 13.0 - 17.0 g/dL   HCT 38.8 (L) 39.0 - 52.0 %   MCV 93.9 80.0 - 100.0 fL   MCH 31.2 26.0 - 34.0 pg   MCHC 33.2 30.0 - 36.0 g/dL   RDW 13.0 11.5 - 15.5 %   Platelets 251 150 - 400 K/uL   nRBC 0.0 0.0 - 0.2 %   Neutrophils Relative % 56 %   Neutro Abs 5.2 1.7 - 7.7 K/uL   Lymphocytes Relative 26 %   Lymphs Abs 2.4 0.7 - 4.0 K/uL   Monocytes Relative 15 %   Monocytes Absolute 1.4 (H) 0.1 -  1.0 K/uL   Eosinophils Relative 2 %   Eosinophils Absolute 0.2 0.0 - 0.5 K/uL   Basophils Relative 1 %   Basophils Absolute 0.1 0.0 - 0.1 K/uL   Immature Granulocytes 0 %   Abs Immature Granulocytes 0.04 0.00 - 0.07 K/uL  Urinalysis, Routine w reflex microscopic Urine, Clean Catch  Result Value  Ref Range   Color, Urine YELLOW YELLOW   APPearance CLEAR CLEAR   Specific Gravity, Urine 1.030 1.005 - 1.030   pH 5.5 5.0 - 8.0   Glucose, UA NEGATIVE NEGATIVE mg/dL   Hgb urine dipstick SMALL (A) NEGATIVE   Bilirubin Urine NEGATIVE NEGATIVE   Ketones, ur NEGATIVE NEGATIVE mg/dL   Protein, ur TRACE (A) NEGATIVE mg/dL   Nitrite NEGATIVE NEGATIVE   Leukocytes,Ua NEGATIVE NEGATIVE   RBC / HPF 11-20 0 - 5 RBC/hpf   WBC, UA 0-5 0 - 5 WBC/hpf   Bacteria, UA RARE (A) NONE SEEN   Squamous Epithelial / HPF 0-5 0 - 5 /HPF   Mucus PRESENT    CT Renal Stone Study  Result Date: 09/08/2022 CLINICAL DATA:  Left-sided flank pain EXAM: CT ABDOMEN AND PELVIS WITHOUT CONTRAST TECHNIQUE: Multidetector CT imaging of the abdomen and pelvis was performed following the standard protocol without IV contrast. RADIATION DOSE REDUCTION: This exam was performed according to the departmental dose-optimization program which includes automated exposure control, adjustment of the mA and/or kV according to patient size and/or use of iterative reconstruction technique. COMPARISON:  07/08/2013 FINDINGS: Lower chest: Lung bases are free of acute infiltrate or sizable effusion. Large hiatal hernia is noted. Hepatobiliary: No focal liver abnormality is seen. No gallstones, gallbladder wall thickening, or biliary dilatation. Pancreas: Unremarkable. No pancreatic ductal dilatation or surrounding inflammatory changes. Spleen: Normal in size without focal abnormality. Adrenals/Urinary Tract: Adrenal glands are within normal limits. Right kidney shows no renal calculi or obstructive changes. Mid polar right simple cyst is seen measuring 2.1  cm. No further follow-up is recommended. The left kidney shows a 3.6 cm cyst in the upper pole which appears simple in nature. Again no follow-up is recommended. 2 mm nonobstructing left renal stone is seen. The ureters are within normal limits. Bladder is decompressed. Stomach/Bowel: Scattered diverticular change of the colon is noted. No evidence of diverticulitis is seen. The appendix is within normal limits. Small bowel and stomach are unremarkable aside from the previously mentioned hiatal hernia. Vascular/Lymphatic: Aortic atherosclerosis. No enlarged abdominal or pelvic lymph nodes. Reproductive: Prostate is unremarkable. Other: No abdominal wall hernia or abnormality. No abdominopelvic ascites. Musculoskeletal: Degenerative changes of lumbar spine are noted. IMPRESSION: Large hiatal hernia. 2 mm nonobstructing left renal stone. Diverticulosis without diverticulitis. Bilateral simple appearing renal cysts. No follow-up imaging is recommended. JACR 2018 Feb; 264-273, Management of the Incidental Renal Mass on CT, [employer] 2021; 814-848, Bosniak Classification of Cystic Renal Masses, Version 2019. Electronically Signed   By: Inez Catalina M.D.   On: 09/08/2022 23:24   CARDIAC CATHETERIZATION  Result Date: 09/03/2022   Prox LAD lesion is 95% stenosed with 95% stenosed side branch in 1st Diag. 1st Diag lesion is 55% stenosed distal to the bifurcation lesion.   A drug-eluting stent was successfully placed in the ostial and proximal  1st Diag (D1) using a SYNERGY XD 2.25X12.   -- Post intervention, the side branch was reduced to 0% residual stenosis.  (Covering the ostial lesion as well as the the 55% stenosis)   A drug-eluting stent was successfully placed in the proximal LAD from just beyond ramus intermedius to PRN major septal perforator distally.  Crosses to diagonal branches as noted.  Using a SYNERGY XD 2.50X28.  Deployed to 2.7 mm, postdilated proximally to 2.8 mm.   -- Post intervention, there is a  0% residual stenosis in the LAD.   --------------------------------------------   Ramus lesion is 65% stenosed -> distal  in-stent restenosis, DES stent   Prox RCA to Mid RCA lesion is 40% stenosed.  Mid RCA bare-metal stent is 5% stenosed. Mid RCA to Dist RCA lesion is 55% stenosed.   1st Diag lesion is 55% stenosed.   ----------------------   The left ventricular systolic function is normal.  LV end diastolic pressure is normal.  There is no aortic valve stenosis. POST-OPERATIVE DIAGNOSIS: CULPRIT LESION: Severe bifurcation LAD-D1 ([place] 1, 1, 1) 95% stenosis beginning just distal to Ramus Intermedius (RI) Successful complex bifurcation two-vessel PCI with Synergy 2.25 x 12 DES ostial proximal D1 and Synergy 2.5 mm x 28 mm deployed to 2.7 mm, postdilated proximally to 2.8 mm (unable to rewire the jailed D1 stent and successfully advance balloon across.  However there is TIMI-3 flow and appears to be widely patent.  I do not push too aggressively.. Patent proximal stent in the Ri with distal stent ISR roughly 65%. After discussion with patient's PCP, based on the prolonged bifurcation PCI, we plan to treat the stenosis medically and bring him back to the Cath Lab if symptoms warrant. Widely patent RCA with focal 40% stenosis proximal to an 55% stenosis distal to the previous stent Apparently normal LVEF with mild to moderately elevated LVEDP. PLAN OF CARE: Admit for overnight observation; DAPT uninterrupted for minimum 6 months, would continue Plavix long-term -Low threshold to consider PTCA/DES PCI of RI lesion.  PATIENT DISPOSITION:  PACU - hemodynamically stable.;    CT Chest W Contrast  Result Date: 09/01/2022 CLINICAL DATA:  Chest pain EXAM: CT CHEST WITH CONTRAST TECHNIQUE: Multidetector CT imaging of the chest was performed during intravenous contrast administration. RADIATION DOSE REDUCTION: This exam was performed according to the departmental dose-optimization program which includes automated exposure  control, adjustment of the mA and/or kV according to patient size and/or use of iterative reconstruction technique. CONTRAST:  67m OMNIPAQUE IOHEXOL 350 MG/ML SOLN COMPARISON:  Chest x-ray 09/01/2022. FINDINGS: Cardiovascular: No significant vascular findings. Normal heart size. No pericardial effusion. There are atherosclerotic calcifications of the coronary arteries. Mediastinum/Nodes: No enlarged mediastinal, hilar, or axillary lymph nodes. Thyroid gland, trachea, and esophagus demonstrate no significant findings. Lungs/Pleura: Lungs are clear. No pleural effusion or pneumothorax. Upper Abdomen: There is a large hiatal hernia containing the proximal stomach. 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 There is a calcified granuloma in the liver. Musculoskeletal: Degenerative changes affect the spine. Lower cervical spinal fusion plate is present. IMPRESSION: 1. No acute cardiopulmonary process. 2. Large hiatal hernia. Electronically Signed   By: ARonney AstersM.D.   On: 09/01/2022 23:50   CT Head Wo Contrast  Result Date: 09/01/2022 CLINICAL DATA:  Headache, new onset. EXAM: CT HEAD WITHOUT CONTRAST TECHNIQUE: Contiguous axial images were obtained from the base of the skull through the vertex without intravenous contrast. RADIATION DOSE REDUCTION: This exam was performed according to the departmental dose-optimization program which includes automated exposure control, adjustment of the mA and/or kV according to patient size and/or use of iterative reconstruction technique. COMPARISON:  None Available. FINDINGS: Brain: No evidence of acute infarction, hemorrhage, hydrocephalus, extra-axial collection or mass lesion/mass effect. Mild cerebral atrophy. Patchy area of low-attenuation white matter presumed chronic microvascular ischemic changes. Vascular: No hyperdense vessel or unexpected calcification. Skull: Normal. Negative for fracture or focal lesion. Sinuses/Orbits: Mucous retention cyst in the right maxillary sinus.  Other: None. IMPRESSION: 1. No acute intracranial abnormality. 2. Atrophy and chronic microvascular ischemic changes of the white matter. Electronically Signed   By: IKeane PoliceD.O.   On:  09/01/2022 23:45   DG Chest 2 View  Result Date: 09/01/2022 CLINICAL DATA:  Chest pain worse with exertion. EXAM: CHEST - 2 VIEW COMPARISON:  Chest radiograph 02/06/2016 FINDINGS: The cardiomediastinal silhouette is normal. There is masslike opacity in the medial left lower lobe seen on the frontal projection, new since 2017. There is no other focal airspace opacity. There is no pulmonary edema. There is no pleural effusion or pneumothorax. There is no acute osseous abnormality. Cervical spine fusion hardware is noted. IMPRESSION: Masslike opacity in the medial left lower lobe seen on the frontal projection is new since 2017. Recommend CT chest for further evaluation. Electronically Signed   By: Valetta Mole M.D.   On: 09/01/2022 18:49    Radiology CT Renal Stone Study  Result Date: 09/08/2022 CLINICAL DATA:  Left-sided flank pain EXAM: CT ABDOMEN AND PELVIS WITHOUT CONTRAST TECHNIQUE: Multidetector CT imaging of the abdomen and pelvis was performed following the standard protocol without IV contrast. RADIATION DOSE REDUCTION: This exam was performed according to the departmental dose-optimization program which includes automated exposure control, adjustment of the mA and/or kV according to patient size and/or use of iterative reconstruction technique. COMPARISON:  07/08/2013 FINDINGS: Lower chest: Lung bases are free of acute infiltrate or sizable effusion. Large hiatal hernia is noted. Hepatobiliary: No focal liver abnormality is seen. No gallstones, gallbladder wall thickening, or biliary dilatation. Pancreas: Unremarkable. No pancreatic ductal dilatation or surrounding inflammatory changes. Spleen: Normal in size without focal abnormality. Adrenals/Urinary Tract: Adrenal glands are within normal limits. Right kidney  shows no renal calculi or obstructive changes. Mid polar right simple cyst is seen measuring 2.1 cm. No further follow-up is recommended. The left kidney shows a 3.6 cm cyst in the upper pole which appears simple in nature. Again no follow-up is recommended. 2 mm nonobstructing left renal stone is seen. The ureters are within normal limits. Bladder is decompressed. Stomach/Bowel: Scattered diverticular change of the colon is noted. No evidence of diverticulitis is seen. The appendix is within normal limits. Small bowel and stomach are unremarkable aside from the previously mentioned hiatal hernia. Vascular/Lymphatic: Aortic atherosclerosis. No enlarged abdominal or pelvic lymph nodes. Reproductive: Prostate is unremarkable. Other: No abdominal wall hernia or abnormality. No abdominopelvic ascites. Musculoskeletal: Degenerative changes of lumbar spine are noted. IMPRESSION: Large hiatal hernia. 2 mm nonobstructing left renal stone. Diverticulosis without diverticulitis. Bilateral simple appearing renal cysts. No follow-up imaging is recommended. JACR 2018 Feb; 264-273, Management of the Incidental Renal Mass on CT, [employer] 2021; 814-848, Bosniak Classification of Cystic Renal Masses, Version 2019. Electronically Signed   By: Inez Catalina M.D.   On: 09/08/2022 23:24    Procedures Procedures    Medications Ordered in ED Medications  lidocaine (LIDODERM) 5 % 2 patch (2 patches Transdermal Patch Applied 09/08/22 2350)  fosfomycin (MONUROL) packet 3 g (has no administration in time range)    ED Course/ Medical Decision Making/ A&P                             Medical Decision Making Patient with L lower back pain no associated symptoms   Problems Addressed: Acute left-sided low back pain without sciatica:    Details: Will start lidoderm  Hiatal hernia:    Details: Follow up with PMD Other microscopic hematuria:    Details: Follow up with urology as an outpatient   Amount and/or Complexity of  Data Reviewed Independent Historian: spouse    Details: See above  Labs: ordered.    Details: All labs reviewed:  normal white count 9.4, slight low hemoglobin 12.9, normal platelet count.  Urine with small blood and a few white cells and few bacterial.   Radiology: ordered and independent interpretation performed.    Details: No stones in the ureters by me on CT  Risk Prescription drug management. Risk Details: Urine treated with fosfomycin in the ED. Will need follow up with urology for microscopic hematuria seen on urine specimen.  Well Appearing.  I suspect this is MSK in nature.  Will add lidoderm.  Stable for discharge with close follow up.  Strict return.      Final Clinical Impression(s) / ED Diagnoses Final diagnoses:  Acute left-sided low back pain without sciatica  Hiatal hernia  Other microscopic hematuria   Return for intractable cough, coughing up blood, fevers > 100.4 unrelieved by medication, shortness of breath, intractable vomiting, chest pain, shortness of breath, weakness, numbness, changes in speech, facial asymmetry, abdominal pain, passing out, Inability to tolerate liquids or food, cough, altered mental status or any concerns. No signs of systemic illness or infection. The patient is nontoxic-appearing on exam and vital signs are within normal limits.  I have reviewed the triage vital signs and the nursing notes. Pertinent labs & imaging results that were available during my care of the patient were reviewed by me and considered in my medical decision making (see chart for details). After history, exam, and medical workup I feel the patient has been appropriately medically screened and is safe for discharge home. Pertinent diagnoses were discussed with the patient. Patient was given return precautions.  Rx / DC Orders ED Discharge Orders     None         Chantale Leugers, MD 09/09/22 1448

## 2022-09-09 NOTE — Telephone Encounter (Signed)
Pt went to the [facility] ED for back px and was given an RX for lidocaine (LIDODERM) 5 %, but his Insurance states he needs a PA before they will cover the medication.  If possible please start a PA for the pt's medication.

## 2022-09-09 NOTE — ED Notes (Signed)
Pt verbalizes understanding of discharge instructions. Opportunity for questions and answers were provided. Pt discharged from the ED.   ?

## 2022-09-10 LAB — URINE CULTURE
Culture: NO GROWTH
Special Requests: NORMAL

## 2022-09-11 ENCOUNTER — Encounter: Payer: Self-pay | Admitting: Gastroenterology

## 2022-09-11 ENCOUNTER — Other Ambulatory Visit (HOSPITAL_COMMUNITY): Payer: Self-pay

## 2022-09-11 NOTE — Telephone Encounter (Signed)
Pharmacy Patient Advocate Encounter   Received notification that prior authorization for Lidocaine 5% patches is required/requested.  Per Test Claim: PA if treating pain associated with post-herpetic neuralgia, chronic back pain or osteoarthritis.  All other diagnoses not coverable under Part D   PA submitted on 09/11/22 to (ins) [employer] via CoverMyMeds Key [id number] Status is pending

## 2022-09-11 NOTE — Progress Notes (Signed)
Cardiology Office Note:    Date:  09/16/2022   ID:  [name], DOB [birth year], MRN [id number]  PCP:  [name], MD   [place] Providers Cardiologist:  Minus Breeding, MD Cardiology APP:  [name], [place] { Referring MD: [name], MD   Chief Complaint  Patient presents with   Follow-up    Post PCI    History of Present Illness:    [name] is a 82 y.o. male with a hx of CAD, hyperlipidemia, hypertension, Barrett's esophagus, and CKD stage III.  He has prior PCI and stenting starting in 2002 with BMS to RCA, DES to LCx, DES to LAD.    Recent cardiac catheterization 09/03/2022 showed severe bifurcation LAD-D1 with 95% stenosis treated with two-vessel PCI/DES x 1 to proximal D1/pLAD.  Patent proximal stent in the RI with distal stent in-stent restenosis of 65%.  DAPT with aspirin and Plavix for at least 6 months, continue Plavix long-term.  Of note, have a low threshold to consider PCI/DES PCI of RI lesion.  Lipitor was increased from 20 to 40 mg daily. Of note, LP(a) was 114.  He is here with his wife for follow up. He is doing well but concerned about elevated afternoon BP in the 140-150s. With further review, this coincided with a salty restaurant meal. I will make no med changes today as I do not want to risk hypotension in this patient on DAPT.  Past Medical History:  Diagnosis Date   Anemia    Barrett's esophagus    Blood transfusion without reported diagnosis    CAD (coronary artery disease)    2002 stenting of the RCA,, 2006 Stenting of diag with DES, PTCA of Circ, 50% LAD   Cancer ([place])    skin cancer scalp and forehead   Chronic kidney disease    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 - decreased dose of HCTZ to M,F   Diverticulosis    DJD (degenerative joint disease)    Duodenal ulcer    ED (erectile dysfunction)    GERD (gastroesophageal reflux disease)    increased s/s in the last week 03-22-19 PV    Hemorrhoids    Hyperlipidemia     Hypertension    Myocardial infarction ([place])    2002   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   mid frontal scalp-cx35f   SCC (squamous cell carcinoma) 06/15/2019   front middle scalp-tx cx348f  SCC (squamous cell carcinoma) 12/27/2002   CIS-right angle of jaw--[id number] SCC (squamous cell carcinoma) 09/24/2004   CIS--mid post crown, left mis crown medial,left first web space-tx [id number] (squamous cell carcinoma) 06/03/2006   SCCA-right temple-tx [id number] (squamous cell carcinoma) 06/03/2006   CIS-right forehead, left mis crown scalp medial-tx cx35fu 57fC (squamous cell carcinoma) 07/30/2006   right temple lateral-txpbx   SCC (squamous cell carcinoma) 11/10/2006   left forehead, left hand proximal   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/02/2009   left scalp-cx35fu  46f (squamous cell carcinoma) 01/24/2015   left scalp-cx35fu   9f(squamous cell carcinoma) 11/12/2015   front lateralscalp-cx35fu, me41f scalp-cx35fu   SC43fquamous cell carcinoma) 12/17/2016   front center scalp-cx35fu   Squ34fs cell carcinoma of skin 08/10/2017   CIS--right ear rim, top of scalp, left side scalp-tx-cx35fu    Pas37frgical History:  Procedure Laterality Date   COLONOSCOPY     CORONARY STENT INTERVENTION N/A 09/03/2022  Procedure: CORONARY STENT INTERVENTION;  Surgeon: [name], MD;  Location: [facility];  Service: Cardiovascular;  Laterality: N/A;   ELBOW ARTHROSCOPY     heart stents     2002,2004   LEFT HEART CATH AND CORONARY ANGIOGRAPHY N/A 09/03/2022   Procedure: LEFT HEART CATH AND CORONARY ANGIOGRAPHY;  Surgeon: [name], MD;  Location: [facility];  Service: Cardiovascular;  Laterality: N/A;   SPINAL FUSION     SPINAL FUSION     SPINE SURGERY     cervical   UPPER GASTROINTESTINAL ENDOSCOPY      Current Medications: Current Meds  Medication Sig   amLODipine (NORVASC) 10 MG tablet TAKE ONE TABLET BY MOUTH EVERYDAY AT BEDTIME   aspirin EC  81 MG tablet Take 81 mg by mouth daily. Swallow whole.   atenolol (TENORMIN) 50 MG tablet TAKE ONE TABLET BY MOUTH EVERY MORNING and TAKE ONE TABLET BY MOUTH EVERYDAY AT BEDTIME   atorvastatin (LIPITOR) 40 MG tablet Take 1 tablet (40 mg total) by mouth at bedtime.   clopidogrel (PLAVIX) 75 MG tablet Take 1 tablet (75 mg total) by mouth daily with breakfast.   Coenzyme Q10 (CO Q 10 PO) Take 1 tablet by mouth daily.   hydrALAZINE (APRESOLINE) 25 MG tablet TAKE ONE TABLET BY MOUTH EVERY MORNING and TAKE ONE TABLET BY MOUTH EVERYDAY AT BEDTIME   isosorbide mononitrate (IMDUR) 30 MG 24 hr tablet Take 1 tablet (30 mg total) by mouth daily.   KRILL OIL PO Take 100 mg by mouth daily. Mega Red   lisinopril (ZESTRIL) 20 MG tablet TAKE ONE TABLET BY MOUTH EVERYDAY AT BEDTIME   Multiple Vitamins-Minerals (CENTRUM SILVER 50+MEN PO) Take 1 tablet by mouth.   Multiple Vitamins-Minerals (PRESERVISION AREDS 2) CAPS Take 1 capsule by mouth 2 (two) times daily.   nitroGLYCERIN (NITROSTAT) 0.4 MG SL tablet DISSOLVE 1 TABLET UNDER THE TONGUE EVERY 5 MINUTES AS NEEDED FOR CHEST PAIN. DO NOT EXCEED A TOTAL OF 3 DOSES IN 15 MINUTES.   pantoprazole (PROTONIX) 40 MG tablet TAKE ONE TABLET BY MOUTH EVERY MORNING   Polyethyl Glycol-Propyl Glycol (SYSTANE) 0.4-0.3 % SOLN Place 2 drops into both eyes every morning.   primidone (MYSOLINE) 50 MG tablet TAKE ONE TABLET BY MOUTH EVERY MORNING and TAKE ONE TABLET BY MOUTH EVERYDAY AT BEDTIME     Allergies:   Patient has no known allergies.   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   Occupation: retired  Tobacco Use   Smoking status: Former    Types: Cigarettes    Quit date: 08/25/1961    Years since quitting: 61.1    Passive exposure: Never   Smokeless  tobacco: Never  Vaping Use   Vaping Use: Never used  Substance and Sexual Activity   Alcohol use: Yes    Comment: very seldom   Drug use: No   Sexual activity: Not Currently  Other Topics Concern   Not on file  Social History Narrative   Right handed   1 Story home    live spouse   Social Determinants of Health   Financial Resource Strain: Low Risk  (03/21/2022)   Overall Financial Resource Strain ([place])  Difficulty of Paying Living Expenses: Not hard at all  Food Insecurity: No Food Insecurity (09/03/2022)   Hunger Vital Sign    Worried About Running Out of Food in the Last Year: Never true    Ran Out of Food in the Last Year: Never true  Transportation Needs: No Transportation Needs (09/03/2022)   PRAPARE - Hydrologist (Medical): No    Lack of Transportation (Non-Medical): No  Physical Activity: Sufficiently Active (03/21/2022)   Exercise Vital Sign    Days of Exercise per Week: 5 days    Minutes of Exercise per Session: 30 min  Stress: No Stress Concern Present (03/21/2022)   [place]    Feeling of Stress : Not at all  Social Connections: Unknown (03/21/2022)   Social Connection and Isolation Panel [NHANES]    Frequency of Communication with Friends and Family: More than three times a week    Frequency of Social Gatherings with Friends and Family: More than three times a week    Attends Religious Services: Not on Advertising copywriter or Organizations: Yes    Attends Music therapist: More than 4 times per year    Marital Status: Married     Family History: The patient's family history includes Alcohol abuse in an other family member; Alzheimer's disease in an other family member; Coronary artery disease in an other family member; Dementia in his mother. There is no history of Colon cancer, Esophageal cancer, Rectal cancer, Stomach cancer, or Colon  polyps.  ROS:   Please see the history of present illness.     All other systems reviewed and are negative.  EKGs/Labs/Other Studies Reviewed:    The following studies were reviewed today:  Cath: 09/03/2022     Prox LAD lesion is 95% stenosed with 95% stenosed side branch in 1st Diag. 1st Diag lesion is 55% stenosed distal to the bifurcation lesion.   A drug-eluting stent was successfully placed in the ostial and proximal  1st Diag (D1) using a SYNERGY XD 2.25X12.   -- Post intervention, the side branch was reduced to 0% residual stenosis.  (Covering the ostial lesion as well as the the 55% stenosis)   A drug-eluting stent was successfully placed in the proximal LAD from just beyond ramus intermedius to PRN major septal perforator distally.  Crosses to diagonal branches as noted.  Using a SYNERGY XD 2.50X28.  Deployed to 2.7 mm, postdilated proximally to 2.8 mm.   -- Post intervention, there is a 0% residual stenosis in the LAD.   --------------------------------------------   Ramus lesion is 65% stenosed -> distal in-stent restenosis, DES stent   Prox RCA to Mid RCA lesion is 40% stenosed.  Mid RCA bare-metal stent is 5% stenosed. Mid RCA to Dist RCA lesion is 55% stenosed.   1st Diag lesion is 55% stenosed.   ----------------------   The left ventricular systolic function is normal.  LV end diastolic pressure is normal.  There is no aortic valve stenosis.   POST-OPERATIVE DIAGNOSIS: CULPRIT LESION: Severe bifurcation LAD-D1 ([place] 1, 1, 1) 95% stenosis beginning just distal to Ramus Intermedius (RI) Successful complex bifurcation two-vessel PCI with Synergy 2.25 x 12 DES ostial proximal D1 and Synergy 2.5 mm x 28 mm deployed to 2.7 mm, postdilated proximally to 2.8 mm (unable to rewire the jailed D1 stent and successfully advance balloon across.  However there is TIMI-3 flow and appears  to be widely patent.  I do not push too aggressively.. Patent proximal stent in the Ri with distal  stent ISR roughly 65%. After discussion with patient's PCP, based on the prolonged bifurcation PCI, we plan to treat the stenosis medically and bring him back to the Cath Lab if symptoms warrant. Widely patent RCA with focal 40% stenosis proximal to an 55% stenosis distal to the previous stent Apparently normal LVEF with mild to moderately elevated LVEDP.   PLAN OF CARE: Admit for overnight observation; DAPT uninterrupted for minimum 6 months, would continue Plavix long-term -Low threshold to consider PTCA/DES PCI of RI lesion.   PATIENT DISPOSITION:  PACU - hemodynamically stable.;     Diagnostic Dominance: Right  Intervention    _____________  EKG:  EKG is not ordered today.   Recent Labs: 09/08/2022: BUN 24; Creatinine, Ser 1.22; Hemoglobin 12.9; Platelets 251; Potassium 4.1; Sodium 139  Recent Lipid Panel    Component Value Date/Time   CHOL 123 12/26/2021 0911   TRIG 119.0 12/26/2021 0911   TRIG 161 (H) 08/28/2006 1023   HDL 48.40 12/26/2021 0911   CHOLHDL 3 12/26/2021 0911   VLDL 23.8 12/26/2021 0911   LDLCALC 51 12/26/2021 0911   LDLCALC 60 03/12/2020 1144     Risk Assessment/Calculations:                Physical Exam:    VS:  BP 128/64 (BP Location: Left Arm, Patient Position: Sitting, Cuff Size: Normal)   Pulse (!) 53   Ht '5\' 6"'$  (1.676 m)   Wt 175 lb (79.4 kg)   SpO2 97%   BMI 28.25 kg/m     Wt Readings from Last 3 Encounters:  09/16/22 175 lb (79.4 kg)  09/03/22 174 lb (78.9 kg)  09/02/22 174 lb 12.8 oz (79.3 kg)     GEN:  Well nourished, well developed in no acute distress HEENT: Normal NECK: No JVD; No carotid bruits LYMPHATICS: No lymphadenopathy CARDIAC: RRR, no murmurs, rubs, gallops RESPIRATORY:  Clear to auscultation without rales, wheezing or rhonchi  ABDOMEN: Soft, non-tender, non-distended MUSCULOSKELETAL:  No edema; No deformity  SKIN: Warm and dry NEUROLOGIC:  Alert and oriented x 3 PSYCHIATRIC:  Normal affect   ASSESSMENT:     1. Essential hypertension   2. Hyperlipidemia with target LDL less than 70   3. Barrett's esophagus without dysplasia   4. CAD S/P percutaneous coronary angioplasty    PLAN:    In order of problems listed above:  CAD Prior stenting to LCx, LAD, and RCA Recent DES-LAD-D1 Aspirin and Plavix x 6 months, then lifelong Plavix No chest pain, slowly walking   Hyperlipidemia with LDL goal less than 70 12/26/2021: Cholesterol 123; HDL 48.40; LDL Cholesterol 51; Triglycerides 119.0; VLDL 23.8 Lipitor was increased from 20 to 40 mg LP(a) 114 May ultimately need 80 mg lipitor.   Hypertension Continue 10 mg amlodipine, 50 mg atenolol, 30 mg Imdur, 20 mg lisinopril, and 25 mg hydralazine twice daily   Barrett's esophagus Continue Protonix     Cardiac Rehabilitation Eligibility Assessment  The patient is ready to start cardiac rehabilitation from a cardiac standpoint.          Medication Adjustments/Labs and Tests Ordered: Current medicines are reviewed at length with the patient today.  Concerns regarding medicines are outlined above.  No orders of the defined types were placed in this encounter.  No orders of the defined types were placed in this encounter.   Patient Instructions  Medication  Instructions:  Your physician recommends that you continue on your current medications as directed. Please refer to the Current Medication list given to you today.   *If you need a refill on your cardiac medications before your next appointment, please call your pharmacy*   Lab Work: NONE ordered at this time of appointment   If you have labs (blood work) drawn today and your tests are completely normal, you will receive your results only by: [place] (if you have MyChart) OR A paper copy in the mail If you have any lab test that is abnormal or we need to change your treatment, we will call you to review the results.   Testing/Procedures: NONE ordered at this time of  appointment     Follow-Up: At [facility], you and your health needs are our priority.  As part of our continuing mission to provide you with exceptional heart care, we have created designated Provider Care Teams.  These Care Teams include your primary Cardiologist (physician) and Advanced Practice Providers (APPs -  Physician Assistants and Nurse Practitioners) who all work together to provide you with the care you need, when you need it.  We recommend signing up for the patient portal called "MyChart".  Sign up information is provided on this After Visit Summary.  MyChart is used to connect with patients for Virtual Visits (Telemedicine).  Patients are able to view lab/test results, encounter notes, upcoming appointments, etc.  Non-urgent messages can be sent to your provider as well.   To learn more about what you can do with MyChart, go to [email or web address].    Your next appointment:   3-4 month(s)  Provider:   Minus Breeding, MD     Other Instructions     Signed, [name], [place]  09/16/2022 11:46 AM    [place]

## 2022-09-12 NOTE — Telephone Encounter (Signed)
Patient Advocate Encounter  Received a fax from [facility] regarding Prior Authorization for Lidocaine 5% Top Patch.   Authorization has been DENIED due to .   Determination letter attached to patient chart

## 2022-09-16 ENCOUNTER — Encounter: Payer: Self-pay | Admitting: Physician Assistant

## 2022-09-16 ENCOUNTER — Telehealth (HOSPITAL_COMMUNITY): Payer: Self-pay

## 2022-09-16 ENCOUNTER — Ambulatory Visit: Payer: PPO | Attending: Physician Assistant | Admitting: Physician Assistant

## 2022-09-16 VITALS — BP 128/64 | HR 53 | Ht 66.0 in | Wt 175.0 lb

## 2022-09-16 DIAGNOSIS — I10 Essential (primary) hypertension: Secondary | ICD-10-CM | POA: Diagnosis not present

## 2022-09-16 DIAGNOSIS — E785 Hyperlipidemia, unspecified: Secondary | ICD-10-CM

## 2022-09-16 DIAGNOSIS — I2510 Atherosclerotic heart disease of native coronary artery without angina pectoris: Secondary | ICD-10-CM

## 2022-09-16 DIAGNOSIS — Z9861 Coronary angioplasty status: Secondary | ICD-10-CM | POA: Diagnosis not present

## 2022-09-16 DIAGNOSIS — K2270 Barrett's esophagus without dysplasia: Secondary | ICD-10-CM

## 2022-09-16 NOTE — Patient Instructions (Signed)
Medication Instructions:  Your physician recommends that you continue on your current medications as directed. Please refer to the Current Medication list given to you today.   *If you need a refill on your cardiac medications before your next appointment, please call your pharmacy*   Lab Work: NONE ordered at this time of appointment   If you have labs (blood work) drawn today and your tests are completely normal, you will receive your results only by: [place] (if you have MyChart) OR A paper copy in the mail If you have any lab test that is abnormal or we need to change your treatment, we will call you to review the results.   Testing/Procedures: NONE ordered at this time of appointment     Follow-Up: At [facility], you and your health needs are our priority.  As part of our continuing mission to provide you with exceptional heart care, we have created designated Provider Care Teams.  These Care Teams include your primary Cardiologist (physician) and Advanced Practice Providers (APPs -  Physician Assistants and Nurse Practitioners) who all work together to provide you with the care you need, when you need it.  We recommend signing up for the patient portal called "MyChart".  Sign up information is provided on this After Visit Summary.  MyChart is used to connect with patients for Virtual Visits (Telemedicine).  Patients are able to view lab/test results, encounter notes, upcoming appointments, etc.  Non-urgent messages can be sent to your provider as well.   To learn more about what you can do with MyChart, go to [email or web address].    Your next appointment:   3-4 month(s)  Provider:   Minus Breeding, MD     Other Instructions

## 2022-09-16 NOTE — Telephone Encounter (Signed)
Pt insurance is active and benefits verified through [employer]. Co-pay $15.00, DED $0.00/$0.00 met, out of pocket $3,200.00/$470.00 met, co-insurance 0%. No pre-authorization required. Tammy/[employer], 09/16/22 @ 10:49AM, RDE#[id number]   How many CR sessions are covered? (36 sessions for TCR, 72 sessions for ICR)72 Is this a lifetime maximum or an annual maximum? Lifetime Has the member used any of these services to date? No Is there a time limit (weeks/months) on start of program and/or program completion? No     Will contact patient to see if he is interested in the Cardiac Rehab Program. If interested, patient will need to complete follow up appt. Once completed, patient will be contacted for scheduling upon review by the RN Navigator.

## 2022-09-17 NOTE — Progress Notes (Signed)
[facility] Quality Team Note  Name: [name] Date of Birth: 06/29/1941 MRN: [id number] Date: 09/17/2022  [facility] Quality Team has reviewed this patient's chart, please see recommendations below:  [facility] Quality Other; ([facility] MRP- MEDICATION RECONCILIATION POST DISCHARGE. PATIENT WAS RECENTLY HOSPITALIZED AND DISCHARGED ON 09/04/2022. PATIENT NEEDS A FOLLOW UP MEDICATION RECONCILIATION BEFORE 10/04/2022 FOR GAP CLOSURE. PATIENT HAS APPT WITH PCP 09/18/2022- DOCUMENTATION MUST MENTION HOSPITALIZATION/DISCHARGE AND A FULL MEDICATION REVIEW BY A PRESCRIBING PROVIDER.)

## 2022-09-18 ENCOUNTER — Encounter: Payer: Self-pay | Admitting: Internal Medicine

## 2022-09-18 ENCOUNTER — Ambulatory Visit (INDEPENDENT_AMBULATORY_CARE_PROVIDER_SITE_OTHER): Payer: PPO | Admitting: Internal Medicine

## 2022-09-18 VITALS — BP 128/68 | HR 68 | Temp 98.4°F | Resp 16 | Ht 66.0 in | Wt 174.0 lb

## 2022-09-18 DIAGNOSIS — D539 Nutritional anemia, unspecified: Secondary | ICD-10-CM

## 2022-09-18 DIAGNOSIS — I10 Essential (primary) hypertension: Secondary | ICD-10-CM | POA: Diagnosis not present

## 2022-09-18 DIAGNOSIS — I2510 Atherosclerotic heart disease of native coronary artery without angina pectoris: Secondary | ICD-10-CM | POA: Diagnosis not present

## 2022-09-18 DIAGNOSIS — D508 Other iron deficiency anemias: Secondary | ICD-10-CM | POA: Diagnosis not present

## 2022-09-18 DIAGNOSIS — I25110 Atherosclerotic heart disease of native coronary artery with unstable angina pectoris: Secondary | ICD-10-CM

## 2022-09-18 LAB — IBC + FERRITIN
Ferritin: 17.6 ng/mL — ABNORMAL LOW (ref 22.0–322.0)
Iron: 76 ug/dL (ref 42–165)
Saturation Ratios: 23.5 % (ref 20.0–50.0)
TIBC: 323.4 ug/dL (ref 250.0–450.0)
Transferrin: 231 mg/dL (ref 212.0–360.0)

## 2022-09-18 LAB — CBC WITH DIFFERENTIAL/PLATELET
Basophils Absolute: 0.1 10*3/uL (ref 0.0–0.1)
Basophils Relative: 0.9 % (ref 0.0–3.0)
Eosinophils Absolute: 0.2 10*3/uL (ref 0.0–0.7)
Eosinophils Relative: 2.5 % (ref 0.0–5.0)
HCT: 35.7 % — ABNORMAL LOW (ref 39.0–52.0)
Hemoglobin: 12.1 g/dL — ABNORMAL LOW (ref 13.0–17.0)
Lymphocytes Relative: 27.3 % (ref 12.0–46.0)
Lymphs Abs: 2.4 10*3/uL (ref 0.7–4.0)
MCHC: 34 g/dL (ref 30.0–36.0)
MCV: 93 fl (ref 78.0–100.0)
Monocytes Absolute: 0.8 10*3/uL (ref 0.1–1.0)
Monocytes Relative: 8.4 % (ref 3.0–12.0)
Neutro Abs: 5.4 10*3/uL (ref 1.4–7.7)
Neutrophils Relative %: 60.9 % (ref 43.0–77.0)
Platelets: 312 10*3/uL (ref 150.0–400.0)
RBC: 3.84 Mil/uL — ABNORMAL LOW (ref 4.22–5.81)
RDW: 13.8 % (ref 11.5–15.5)
WBC: 9 10*3/uL (ref 4.0–10.5)

## 2022-09-18 LAB — VITAMIN B12: Vitamin B-12: 381 pg/mL (ref 211–911)

## 2022-09-18 LAB — FOLATE: Folate: >23.8 ng/mL (ref >5.9)

## 2022-09-18 MED ORDER — METOPROLOL SUCCINATE ER 25 MG PO TB24: 25.0000 mg | ORAL_TABLET | Freq: Every day | ORAL | 1 refills | Status: DC

## 2022-09-18 NOTE — Patient Instructions (Signed)

## 2022-09-18 NOTE — Progress Notes (Signed)
Subjective:  Patient ID: [name], male    DOB: Jan 11, 1941  Age: 82 y.o. MRN: [id number]  CC: Coronary Artery Disease and Anemia   HPI [name] presents for f/up  He complains of labile BP - at times the SBP is 150 and then down to 110. He does not think atenolol lasts for 24 hours.  Outpatient Medications Prior to Visit  Medication Sig Dispense Refill   amLODipine (NORVASC) 10 MG tablet TAKE ONE TABLET BY MOUTH EVERYDAY AT BEDTIME 90 tablet 3   aspirin EC 81 MG tablet Take 81 mg by mouth daily. Swallow whole.     atorvastatin (LIPITOR) 40 MG tablet Take 1 tablet (40 mg total) by mouth at bedtime. 90 tablet 3   clopidogrel (PLAVIX) 75 MG tablet Take 1 tablet (75 mg total) by mouth daily with breakfast. 90 tablet 2   Coenzyme Q10 (CO Q 10 PO) Take 1 tablet by mouth daily.     hydrALAZINE (APRESOLINE) 25 MG tablet TAKE ONE TABLET BY MOUTH EVERY MORNING and TAKE ONE TABLET BY MOUTH EVERYDAY AT BEDTIME 180 tablet 3   isosorbide mononitrate (IMDUR) 30 MG 24 hr tablet Take 1 tablet (30 mg total) by mouth daily. 90 tablet 3   KRILL OIL PO Take 100 mg by mouth daily. Mega Red     lidocaine (LIDODERM) 5 % Place 1 patch onto the skin daily. Remove & Discard patch within 12 hours or as directed by MD (Patient not taking: Reported on 09/23/2022) 30 patch 0   lisinopril (ZESTRIL) 20 MG tablet TAKE ONE TABLET BY MOUTH EVERYDAY AT BEDTIME 90 tablet 3   Multiple Vitamins-Minerals (CENTRUM SILVER 50+MEN PO) Take 1 tablet by mouth.     Multiple Vitamins-Minerals (PRESERVISION AREDS 2) CAPS Take 1 capsule by mouth 2 (two) times daily.     nitroGLYCERIN (NITROSTAT) 0.4 MG SL tablet DISSOLVE 1 TABLET UNDER THE TONGUE EVERY 5 MINUTES AS NEEDED FOR CHEST PAIN. DO NOT EXCEED A TOTAL OF 3 DOSES IN 15 MINUTES. 25 tablet 11   pantoprazole (PROTONIX) 40 MG tablet TAKE ONE TABLET BY MOUTH EVERY MORNING 90 tablet 0   Polyethyl Glycol-Propyl Glycol (SYSTANE) 0.4-0.3 % SOLN Place 2 drops into both  eyes every morning.     primidone (MYSOLINE) 50 MG tablet TAKE ONE TABLET BY MOUTH EVERY MORNING and TAKE ONE TABLET BY MOUTH EVERYDAY AT BEDTIME 180 tablet 0   atenolol (TENORMIN) 50 MG tablet TAKE ONE TABLET BY MOUTH EVERY MORNING and TAKE ONE TABLET BY MOUTH EVERYDAY AT BEDTIME 180 tablet 3   fluocinonide-emollient (LIDEX-E) 0.05 % cream Apply 1 Application topically 2 (two) times daily. 60 g 0   No facility-administered medications prior to visit.    ROS Review of Systems  Constitutional: Negative.  Negative for diaphoresis and fatigue.  HENT: Negative.    Eyes: Negative.   Respiratory:  Negative for chest tightness, shortness of breath and wheezing.   Cardiovascular:  Negative for chest pain, palpitations and leg swelling.  Gastrointestinal:  Negative for abdominal pain, diarrhea, nausea and vomiting.  Endocrine: Negative.   Genitourinary:  Positive for hematuria. Negative for difficulty urinating.  Musculoskeletal: Negative.  Negative for arthralgias.  Skin: Negative.  Negative for color change and pallor.  Neurological: Negative.  Negative for dizziness and weakness.  Hematological:  Negative for adenopathy. Does not bruise/bleed easily.  Psychiatric/Behavioral: Negative.      Objective:  BP 128/68 (BP Location: Left Arm, Patient Position: Sitting, Cuff Size:  Large)   Pulse 68   Temp 98.4 F (36.9 C) (Oral)   Resp 16   Ht '5\' 6"'$  (1.676 m)   Wt 174 lb (78.9 kg)   SpO2 95%   BMI 28.08 kg/m   BP Readings from Last 3 Encounters:  09/18/22 128/68  09/16/22 128/64  09/09/22 138/74    Wt Readings from Last 3 Encounters:  09/18/22 174 lb (78.9 kg)  09/16/22 175 lb (79.4 kg)  09/03/22 174 lb (78.9 kg)    Physical Exam Vitals reviewed.  HENT:     Mouth/Throat:     Mouth: Mucous membranes are moist.  Eyes:     General: No scleral icterus.    Conjunctiva/sclera: Conjunctivae normal.  Cardiovascular:     Rate and Rhythm: Normal rate and regular rhythm.     Heart  sounds: No murmur heard. Pulmonary:     Effort: Pulmonary effort is normal.     Breath sounds: No stridor. No wheezing, rhonchi or rales.  Abdominal:     General: Abdomen is flat.     Palpations: There is no mass.     Tenderness: There is no abdominal tenderness. There is no guarding.     Hernia: No hernia is present.  Musculoskeletal:        General: Normal range of motion.     Cervical back: Neck supple.     Right lower leg: No edema.     Left lower leg: No edema.  Lymphadenopathy:     Cervical: No cervical adenopathy.  Skin:    General: Skin is warm and dry.     Coloration: Skin is pale.  Neurological:     General: No focal deficit present.     Mental Status: He is alert.  Psychiatric:        Mood and Affect: Mood normal.        Behavior: Behavior normal.     Lab Results  Component Value Date   WBC 9.0 09/18/2022   HGB 12.1 (L) 09/18/2022   HCT 35.7 (L) 09/18/2022   PLT 312.0 09/18/2022   GLUCOSE 100 (H) 09/08/2022   CHOL 123 12/26/2021   TRIG 119.0 12/26/2021   HDL 48.40 12/26/2021   LDLCALC 51 12/26/2021   ALT 19 03/14/2021   AST 22 03/14/2021   NA 139 09/08/2022   K 4.1 09/08/2022   CL 102 09/08/2022   CREATININE 1.22 09/08/2022   BUN 24 (H) 09/08/2022   CO2 28 09/08/2022   TSH 1.13 03/14/2021   PSA 2.62 02/15/2018    CT Renal Stone Study  Result Date: 09/08/2022 CLINICAL DATA:  Left-sided flank pain EXAM: CT ABDOMEN AND PELVIS WITHOUT CONTRAST TECHNIQUE: Multidetector CT imaging of the abdomen and pelvis was performed following the standard protocol without IV contrast. RADIATION DOSE REDUCTION: This exam was performed according to the departmental dose-optimization program which includes automated exposure control, adjustment of the mA and/or kV according to patient size and/or use of iterative reconstruction technique. COMPARISON:  07/08/2013 FINDINGS: Lower chest: Lung bases are free of acute infiltrate or sizable effusion. Large hiatal hernia is noted.  Hepatobiliary: No focal liver abnormality is seen. No gallstones, gallbladder wall thickening, or biliary dilatation. Pancreas: Unremarkable. No pancreatic ductal dilatation or surrounding inflammatory changes. Spleen: Normal in size without focal abnormality. Adrenals/Urinary Tract: Adrenal glands are within normal limits. Right kidney shows no renal calculi or obstructive changes. Mid polar right simple cyst is seen measuring 2.1 cm. No further follow-up is recommended. The left kidney shows  a 3.6 cm cyst in the upper pole which appears simple in nature. Again no follow-up is recommended. 2 mm nonobstructing left renal stone is seen. The ureters are within normal limits. Bladder is decompressed. Stomach/Bowel: Scattered diverticular change of the colon is noted. No evidence of diverticulitis is seen. The appendix is within normal limits. Small bowel and stomach are unremarkable aside from the previously mentioned hiatal hernia. Vascular/Lymphatic: Aortic atherosclerosis. No enlarged abdominal or pelvic lymph nodes. Reproductive: Prostate is unremarkable. Other: No abdominal wall hernia or abnormality. No abdominopelvic ascites. Musculoskeletal: Degenerative changes of lumbar spine are noted. IMPRESSION: Large hiatal hernia. 2 mm nonobstructing left renal stone. Diverticulosis without diverticulitis. Bilateral simple appearing renal cysts. No follow-up imaging is recommended. JACR 2018 Feb; 264-273, Management of the Incidental Renal Mass on CT, [employer] 2021; 814-848, Bosniak Classification of Cystic Renal Masses, Version 2019. Electronically Signed   By: Inez Catalina M.D.   On: 09/08/2022 23:24    Assessment & Plan:   [name] was seen today for coronary artery disease and anemia.  Diagnoses and all orders for this visit:  Deficiency anemia- Will evaluate for vitamin deficiencies. -     IBC + Ferritin; Future -     CBC with Differential/Platelet; Future -     Vitamin B1; Future -     Zinc;  Future -     Reticulocytes; Future -     Vitamin B12; Future -     Folate; Future -     Folate -     Vitamin B12 -     Reticulocytes -     Zinc -     Vitamin B1 -     CBC with Differential/Platelet -     IBC + Ferritin  Coronary artery disease involving native coronary artery of native heart without angina pectoris -     metoprolol succinate (TOPROL-XL) 25 MG 24 hr tablet; Take 1 tablet (25 mg total) by mouth daily.  Essential hypertension- Will upgrade to a better BB.  Coronary artery disease involving native coronary artery of native heart with unstable angina pectoris (HCC)  Iron deficiency anemia secondary to inadequate dietary iron intake -     ferrous sulfate 325 (65 FE) MG tablet; Take 1 tablet (325 mg total) by mouth daily with breakfast.  Zinc excess- He agrees to lower his zinc dose.   I have discontinued Steven Comes "Joe"'s atenolol and fluocinonide-emollient. I am also having him start on metoprolol succinate and ferrous sulfate. Additionally, I am having him maintain his Multiple Vitamins-Minerals (CENTRUM SILVER 50+MEN PO), Coenzyme Q10 (CO Q 10 PO), KRILL OIL PO, aspirin EC, amLODipine, hydrALAZINE, lisinopril, nitroGLYCERIN, isosorbide mononitrate, PreserVision AREDS 2, Systane, primidone, pantoprazole, atorvastatin, clopidogrel, and lidocaine.  Meds ordered this encounter  Medications   metoprolol succinate (TOPROL-XL) 25 MG 24 hr tablet    Sig: Take 1 tablet (25 mg total) by mouth daily.    Dispense:  90 tablet    Refill:  1   ferrous sulfate 325 (65 FE) MG tablet    Sig: Take 1 tablet (325 mg total) by mouth daily with breakfast.    Dispense:  90 tablet    Refill:  1     Follow-up: Return in about 4 months (around 01/17/2023).  Scarlette Calico, MD

## 2022-09-19 ENCOUNTER — Encounter: Payer: Self-pay | Admitting: Internal Medicine

## 2022-09-19 MED ORDER — FERROUS SULFATE 325 (65 FE) MG PO TABS: 325.0000 mg | ORAL_TABLET | Freq: Every day | ORAL | 1 refills | Status: DC

## 2022-09-23 ENCOUNTER — Telehealth: Payer: Self-pay | Admitting: Pharmacist

## 2022-09-23 ENCOUNTER — Encounter: Payer: Self-pay | Admitting: Internal Medicine

## 2022-09-23 DIAGNOSIS — D508 Other iron deficiency anemias: Secondary | ICD-10-CM | POA: Insufficient documentation

## 2022-09-23 LAB — ZINC: Zinc: 170 ug/dL — ABNORMAL HIGH (ref 60–130)

## 2022-09-23 LAB — VITAMIN B1: Vitamin B1 (Thiamine): 25 nmol/L (ref 8–30)

## 2022-09-23 LAB — RETICULOCYTES
ABS Retic: 70490 cells/uL (ref 25000–90000)
Retic Ct Pct: 1.9 %

## 2022-09-23 NOTE — Progress Notes (Signed)
[facility])   [place] Team Medication Reconciliation  09/23/2022  NOAH LEMBKE 1940-10-05 [id number]  Referral source:  Care Management  OBJECTIVE:  Lab Results  Component Value Date   CREATININE 1.22 09/08/2022   CREATININE 1.06 09/04/2022   CREATININE 1.17 09/01/2022    No results found for: "HGBA1C"     Component Value Date/Time   CHOL 123 12/26/2021 0911   TRIG 119.0 12/26/2021 0911   TRIG 161 (H) 08/28/2006 1023   HDL 48.40 12/26/2021 0911   CHOLHDL 3 12/26/2021 0911   VLDL 23.8 12/26/2021 0911   LDLCALC 51 12/26/2021 0911   LDLCALC 60 03/12/2020 1144    BP Readings from Last 3 Encounters:  09/18/22 128/68  09/16/22 128/64  09/09/22 138/74    No Known Allergies  ASSESSMENT:  Patient was recently discharged from Hospital and all medications have been reviewed.  Date Discharged from Hospital: 09/04/2022  Date Medication Reconciliation Performed: 09/23/2022   Current Outpatient Medications:    amLODipine (NORVASC) 10 MG tablet, TAKE ONE TABLET BY MOUTH EVERYDAY AT BEDTIME, Disp: 90 tablet, Rfl: 3   aspirin EC 81 MG tablet, Take 81 mg by mouth daily. Swallow whole., Disp: , Rfl:    atorvastatin (LIPITOR) 40 MG tablet, Take 1 tablet (40 mg total) by mouth at bedtime., Disp: 90 tablet, Rfl: 3   clopidogrel (PLAVIX) 75 MG tablet, Take 1 tablet (75 mg total) by mouth daily with breakfast., Disp: 90 tablet, Rfl: 2   Coenzyme Q10 (CO Q 10 PO), Take 1 tablet by mouth daily., Disp: , Rfl:    ferrous sulfate 325 (65 FE) MG tablet, Take 1 tablet (325 mg total) by mouth daily with breakfast., Disp: 90 tablet, Rfl: 1   hydrALAZINE (APRESOLINE) 25 MG tablet, TAKE ONE TABLET BY MOUTH EVERY MORNING and TAKE ONE TABLET BY MOUTH EVERYDAY AT BEDTIME, Disp: 180 tablet, Rfl: 3   isosorbide mononitrate (IMDUR) 30 MG 24 hr tablet, Take 1 tablet (30 mg total) by mouth daily., Disp: 90 tablet, Rfl: 3   KRILL OIL PO, Take 100 mg by mouth daily.  Mega Red, Disp: , Rfl:    lisinopril (ZESTRIL) 20 MG tablet, TAKE ONE TABLET BY MOUTH EVERYDAY AT BEDTIME, Disp: 90 tablet, Rfl: 3   metoprolol succinate (TOPROL-XL) 25 MG 24 hr tablet, Take 1 tablet (25 mg total) by mouth daily., Disp: 90 tablet, Rfl: 1   Multiple Vitamins-Minerals (CENTRUM SILVER 50+MEN PO), Take 1 tablet by mouth., Disp: , Rfl:    Multiple Vitamins-Minerals (PRESERVISION AREDS 2) CAPS, Take 1 capsule by mouth 2 (two) times daily., Disp: , Rfl:    nitroGLYCERIN (NITROSTAT) 0.4 MG SL tablet, DISSOLVE 1 TABLET UNDER THE TONGUE EVERY 5 MINUTES AS NEEDED FOR CHEST PAIN. DO NOT EXCEED A TOTAL OF 3 DOSES IN 15 MINUTES., Disp: 25 tablet, Rfl: 11   pantoprazole (PROTONIX) 40 MG tablet, TAKE ONE TABLET BY MOUTH EVERY MORNING, Disp: 90 tablet, Rfl: 0   Polyethyl Glycol-Propyl Glycol (SYSTANE) 0.4-0.3 % SOLN, Place 2 drops into both eyes every morning., Disp: , Rfl:    primidone (MYSOLINE) 50 MG tablet, TAKE ONE TABLET BY MOUTH EVERY MORNING and TAKE ONE TABLET BY MOUTH EVERYDAY AT BEDTIME, Disp: 180 tablet, Rfl: 0   lidocaine (LIDODERM) 5 %, Place 1 patch onto the skin daily. Remove & Discard patch within 12 hours or as directed by MD (Patient not taking: Reported on 09/23/2022), Disp: 30 patch, Rfl: 0   Medications Discontinued at Discharge: N/A  New Medications at Discharge:  Clopidogrel 75 mg  Medications with Dose Adjustments at Discharge: atorvastatin primodone  Plan:  Patient reports he is doing well and both blood pressure and heart rate are controlled.  Curlene Labrum, PharmD [facility] Pharmacist Office: [phone]

## 2022-10-10 DIAGNOSIS — R3121 Asymptomatic microscopic hematuria: Secondary | ICD-10-CM | POA: Diagnosis not present

## 2022-10-15 ENCOUNTER — Telehealth (HOSPITAL_COMMUNITY): Payer: Self-pay

## 2022-10-15 NOTE — Telephone Encounter (Signed)
Called patient to see if he was interested in participating in the Cardiac Rehab Program. Patient stated yes. Patient will come in for orientation on 10/16/22 @ 9:30AM and will attend the 1:45PM exercise class.

## 2022-10-16 ENCOUNTER — Encounter (HOSPITAL_COMMUNITY): Payer: Self-pay

## 2022-10-16 ENCOUNTER — Encounter (HOSPITAL_COMMUNITY)
Admission: RE | Admit: 2022-10-16 | Discharge: 2022-10-16 | Disposition: A | Discharge status: Home / Self Care | Payer: PPO | Source: Ambulatory Visit | Attending: Cardiology | Admitting: Cardiology

## 2022-10-16 ENCOUNTER — Encounter: Payer: Self-pay | Admitting: Cardiology

## 2022-10-16 VITALS — BP 132/62 | HR 55 | Ht 65.0 in | Wt 173.5 lb

## 2022-10-16 DIAGNOSIS — Z955 Presence of coronary angioplasty implant and graft: Secondary | ICD-10-CM | POA: Diagnosis not present

## 2022-10-16 DIAGNOSIS — R079 Chest pain, unspecified: Secondary | ICD-10-CM

## 2022-10-16 NOTE — Progress Notes (Addendum)
Patient reported experiencing chest tightness fullness 4 minutes into his 6 minute walk test. [name] described the discomfort as mid sternal non radiating rated a  3-4/10. [name] did not report having chest tightness to exercise physiologist Esmeralda Links until after the walk test was completed. Intermittent run of nonsustained PVC's noted at the time. Blood pressure 120/68 heart rate in the 50's resting. [name] said that chest discomfort/ fullness went away with rest. Dr Inda Merlin on site provider notified of event and reviewed ECG tracings. 12 lead ECG ordered and obtained. Dr Cyd Silence reviewed the 12 lead and spoke with Dr [name] over the phone. Dr Cyd Silence and Dr [name] said no acute changes were noted. [name] spoke with Dr Cyd Silence about taking the Atenolol and Toprol together and will take the Toprol only. Appointment obtained for [name] to follow up with [facility] on Tuesday 10/21/22 at 0900. Will hold off on starting exercise until cleared. Patient and wife aware and agreeable to the plan. [name] left cardiac rehab without complaints or symptoms.Steven Gave RN BSN

## 2022-10-16 NOTE — Progress Notes (Signed)
  Progress Note   Patient: [name] [id number] DOB: 10-27-1940 DOA: 10/16/2022     0 DOS: the patient was seen and examined on 10/16/2022   Subjective:   Notified by nursing staff that patient suddenly began to have chest discomfort during his 6-minute walk.  Patient describes chest discomfort as midsternal, pressure-like in quality and mild to moderate intensity.  Upon my discussion with the patient, patient reports that he has been having similar episodes of chest discomfort with similar levels of physical activity over the past several weeks since his cardiac catheterization.  Patient reports that this chest discomfort today has not increased in intensity or duration and seems to be quickly resolving as the patient is sitting down and resting in the clinic.  Physical Exam: Vitals:   10/16/22 1124  BP: 132/62  Pulse: (!) 55  SpO2: 96%  Weight: 78.7 kg  Height: 5' 5"$  (1.651 m)   GENERAL: AAO x3, no acute distress CV: S1, S2 , RRR CHEST: No evidence of reproducible tenderness/pain on palpation LUNGS: clear B/L without rales or wheezing.    Data Reviewed:   ECG: Personally reviewed, sinus bradycardia at 51 bpm.  Mostly no significant changes compared to prior EKGs with exception of some mild ST segment elevation noted in V2 and V3 with morphology not consistent with myocardial infarction.   Assessment/Plan:  Chest Pain -chest discomfort seems consistent with stable angina.  No unstable features present.  Furthermore, EKG, while exhibiting some mild ST segment elevation in leads V2 and V3, does not have acute morphology consistent with new myocardial infarction.  Additionally, I discussed the case with the patient's cardiologist Dr. [name] who reviewed the EKG with me and agrees.  Patient has been advised to follow-up closely with his outpatient PCP and cardiologist.  Author: Vernelle Emerald, MD 10/16/2022 1:53 PM  For on call review [email or web address].

## 2022-10-16 NOTE — Progress Notes (Signed)
Cardiac Rehab Medication Review by a nurse  Does the patient  feel that his/her medications are working for him/her?  yes  Has the patient been experiencing any side effects to the medications prescribed?  no  Does the patient measure his/her own blood pressure or blood glucose at home?  yes   Does the patient have any problems obtaining medications due to transportation or finances?   no  Understanding of regimen: good Understanding of indications: good Potential of compliance: good    Nurse comments: Wille Glaser says he has started taking 50 mg of Atenolol in the morning and 25 mg of Toprol XL at night. Joe does not have a current prescription for Atenolol. Joe says that Dr [name] had previously prescribed Atenolol for his previously. Will notify Dr [name] and Dr Ronnald Ramp to get further guidance for the patient.     Christa See [facility] RN 10/16/2022 10:12 AM

## 2022-10-16 NOTE — Progress Notes (Signed)
Cardiac Individual Treatment Plan  Patient Details  Name: DAMARIE BREIG MRN: [id number] Date of Birth: 1940-11-03 Referring Provider:   Flowsheet Row INTENSIVE CARDIAC REHAB ORIENT from 10/16/2022 in [facility]  Referring Provider Dr. Minus Breeding, MD       Initial Encounter Date:  [place] from 10/16/2022 in [facility]  Date 10/16/22       Visit Diagnosis: 09/03/22 S/P DES Ostial and Proximal LAD  Chest pain, unspecified type - Plan: EKG 12-Lead, EKG 12-Lead  Patient's Home Medications on Admission:  Current Outpatient Medications:    amLODipine (NORVASC) 10 MG tablet, TAKE ONE TABLET BY MOUTH EVERYDAY AT BEDTIME, Disp: 90 tablet, Rfl: 3   aspirin EC 81 MG tablet, Take 81 mg by mouth daily. Swallow whole., Disp: , Rfl:    atorvastatin (LIPITOR) 40 MG tablet, Take 1 tablet (40 mg total) by mouth at bedtime., Disp: 90 tablet, Rfl: 3   clopidogrel (PLAVIX) 75 MG tablet, Take 1 tablet (75 mg total) by mouth daily with breakfast., Disp: 90 tablet, Rfl: 2   Coenzyme Q10 (CO Q 10 PO), Take 1 tablet by mouth daily., Disp: , Rfl:    ferrous sulfate 325 (65 FE) MG tablet, Take 1 tablet (325 mg total) by mouth daily with breakfast., Disp: 90 tablet, Rfl: 1   hydrALAZINE (APRESOLINE) 25 MG tablet, TAKE ONE TABLET BY MOUTH EVERY MORNING and TAKE ONE TABLET BY MOUTH EVERYDAY AT BEDTIME, Disp: 180 tablet, Rfl: 3   isosorbide mononitrate (IMDUR) 30 MG 24 hr tablet, Take 1 tablet (30 mg total) by mouth daily., Disp: 90 tablet, Rfl: 3   KRILL OIL PO, Take 100 mg by mouth daily. Mega Red, Disp: , Rfl:    lidocaine (LIDODERM) 5 %, Place 1 patch onto the skin daily. Remove & Discard patch within 12 hours or as directed by MD (Patient not taking: Reported on 09/23/2022), Disp: 30 patch, Rfl: 0   lisinopril (ZESTRIL) 20 MG tablet, TAKE ONE TABLET  BY MOUTH EVERYDAY AT BEDTIME, Disp: 90 tablet, Rfl: 3   metoprolol succinate (TOPROL-XL) 25 MG 24 hr tablet, Take 1 tablet (25 mg total) by mouth daily., Disp: 90 tablet, Rfl: 1   Multiple Vitamins-Minerals (CENTRUM SILVER 50+MEN PO), Take 1 tablet by mouth., Disp: , Rfl:    Multiple Vitamins-Minerals (PRESERVISION AREDS 2) CAPS, Take 1 capsule by mouth 2 (two) times daily., Disp: , Rfl:    nitroGLYCERIN (NITROSTAT) 0.4 MG SL tablet, DISSOLVE 1 TABLET UNDER THE TONGUE EVERY 5 MINUTES AS NEEDED FOR CHEST PAIN. DO NOT EXCEED A TOTAL OF 3 DOSES IN 15 MINUTES., Disp: 25 tablet, Rfl: 11   pantoprazole (PROTONIX) 40 MG tablet, TAKE ONE TABLET BY MOUTH EVERY MORNING, Disp: 90 tablet, Rfl: 0   Polyethyl Glycol-Propyl Glycol (SYSTANE) 0.4-0.3 % SOLN, Place 2 drops into both eyes every morning., Disp: , Rfl:    primidone (MYSOLINE) 50 MG tablet, TAKE ONE TABLET BY MOUTH EVERY MORNING and TAKE ONE TABLET BY MOUTH EVERYDAY AT BEDTIME, Disp: 180 tablet, Rfl: 0  Past Medical History: Past Medical History:  Diagnosis Date   Anemia    Barrett's esophagus    Blood transfusion without reported diagnosis    CAD (coronary artery disease)    2002 stenting of the RCA,, 2006 Stenting of diag with DES, PTCA of Circ, 50% LAD   Cancer ([place])    skin  cancer scalp and forehead   Chronic kidney disease    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 - decreased dose of HCTZ to M,F   Diverticulosis    DJD (degenerative joint disease)    Duodenal ulcer    ED (erectile dysfunction)    GERD (gastroesophageal reflux disease)    increased s/s in the last week 03-22-19 PV    Hemorrhoids    Hyperlipidemia    Hypertension    Myocardial infarction ([place])    2002   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   mid frontal scalp-cx55f   SCC (squamous cell carcinoma) 06/15/2019   front middle scalp-tx cx352f  SCC (squamous cell carcinoma) 12/27/2002   CIS-right angle of jaw--[id number] SCC (squamous cell carcinoma) 09/24/2004   CIS--mid post  crown, left mis crown medial,left first web space-tx [id number] (squamous cell carcinoma) 06/03/2006   SCCA-right temple-tx [id number] (squamous cell carcinoma) 06/03/2006   CIS-right forehead, left mis crown scalp medial-tx cx35fu 52fC (squamous cell carcinoma) 07/30/2006   right temple lateral-txpbx   SCC (squamous cell carcinoma) 11/10/2006   left forehead, left hand proximal   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/02/2009   left scalp-cx35fu  62f (squamous cell carcinoma) 01/24/2015   left scalp-cx35fu   70f(squamous cell carcinoma) 11/12/2015   front lateralscalp-cx35fu, me49f scalp-cx35fu   SC36fquamous cell carcinoma) 12/17/2016   front center scalp-cx35fu   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/10/2017   CIS--right ear rim, top of scalp, left side scalp-tx-cx35fu    Tob53f Use: Social History   Tobacco Use  Smoking Status Former   Types: Cigarettes   Quit date: 08/25/1961   Years since quitting: 61.1   Passive exposure: Never  Smokeless Tobacco Never    Labs: Review Flowsheet  More data exists      Latest Ref Rng & Units 03/08/2018 03/10/2019 03/12/2020 03/14/2021 12/26/2021  Labs for ITP Cardiac and Pulmonary Rehab  Cholestrol 0 - 200 mg/dL 139  126  124  130  123   LDL (calc) 0 - 99 mg/dL 59  64  60  55  51   HDL-C >39.00 mg/dL 50.40  42.40  44  48.70  48.40   Trlycerides 0.0 - 149.0 mg/dL 149.0  101.0  121  135.0  119.0     Capillary Blood Glucose: No results found for: "GLUCAP"   Exercise Target Goals: Exercise Program Goal: Individual exercise prescription set using results from initial 6 min walk test and THRR while considering  patient's activity barriers and safety.   Exercise Prescription Goal: Initial exercise prescription builds to 30-45 minutes a day of aerobic activity, 2-3 days per week.  Home exercise guidelines will be given to patient during program as part of exercise prescription that the participant will acknowledge.  Activity Barriers &  Risk Stratification:  Activity Barriers & Cardiac Risk Stratification - 10/16/22 1438       Activity Barriers & Cardiac Risk Stratification   Activity Barriers Arthritis;Deconditioning;Shortness of Breath;Joint Problems;Neck/Spine Problems;Back Problems;Balance Concerns    Cardiac Risk Stratification High   Under 5 Mets 6MWT            6 Minute Walk:  6 Minute Walk     Row Name 10/16/22 1435         6 Minute Walk   Phase Initial     Distance 1040 feet     Walk Time 6 minutes     # of  Rest Breaks 0     MPH 1.97     METS 1.53     RPE 12     Perceived Dyspnea  1     VO2 Peak 5.35     Symptoms Yes (comment)     Comments Chest fullness at end of walk test-resolved with rest     Resting HR 55 bpm     Resting BP 132/62     Resting Oxygen Saturation  96 %     Exercise Oxygen Saturation  during 6 min walk 96 %     Max Ex. HR 87 bpm     Max Ex. BP 144/72     2 Minute Post BP 120/68              Oxygen Initial Assessment:   Oxygen Re-Evaluation:   Oxygen Discharge (Final Oxygen Re-Evaluation):   Initial Exercise Prescription:  Initial Exercise Prescription - 10/16/22 1400       Date of Initial Exercise RX and Referring Provider   Date 10/16/22    Referring Provider Dr. Minus Breeding, MD    Expected Discharge Date 12/26/22      Arm Ergometer   Level 1    RPM 40    Minutes 15    METs 1.4      Track   Laps 15    Minutes 15    METs 1.6      Prescription Details   Frequency (times per week) 3    Duration Progress to 30 minutes of continuous aerobic without signs/symptoms of physical distress      Intensity   THRR 40-80% of Max Heartrate 54-109    Ratings of Perceived Exertion 11-13    Perceived Dyspnea 0-4      Progression   Progression Continue progressive overload as per policy without signs/symptoms or physical distress.      Resistance Training   Training Prescription Yes    Weight 3    Reps 10-15             Perform Capillary  Blood Glucose checks as needed.  Exercise Prescription Changes:   Exercise Comments:   Exercise Goals and Review:   Exercise Goals     Row Name 10/16/22 1445             Exercise Goals   Increase Physical Activity Yes       Intervention Provide advice, education, support and counseling about physical activity/exercise needs.;Develop an individualized exercise prescription for aerobic and resistive training based on initial evaluation findings, risk stratification, comorbidities and participant's personal goals.       Expected Outcomes Short Term: Attend rehab on a regular basis to increase amount of physical activity.;Long Term: Exercising regularly at least 3-5 days a week.;Long Term: Add in home exercise to make exercise part of routine and to increase amount of physical activity.       Increase Strength and Stamina Yes       Intervention Provide advice, education, support and counseling about physical activity/exercise needs.;Develop an individualized exercise prescription for aerobic and resistive training based on initial evaluation findings, risk stratification, comorbidities and participant's personal goals.       Expected Outcomes Short Term: Increase workloads from initial exercise prescription for resistance, speed, and METs.;Short Term: Perform resistance training exercises routinely during rehab and add in resistance training at home;Long Term: Improve cardiorespiratory fitness, muscular endurance and strength as measured by increased METs and functional capacity (6MWT)  Able to understand and use rate of perceived exertion (RPE) scale Yes       Intervention Provide education and explanation on how to use RPE scale       Expected Outcomes Short Term: Able to use RPE daily in rehab to express subjective intensity level;Long Term:  Able to use RPE to guide intensity level when exercising independently       Able to understand and use Dyspnea scale Yes       Intervention  Provide education and explanation on how to use Dyspnea scale       Expected Outcomes Short Term: Able to use Dyspnea scale daily in rehab to express subjective sense of shortness of breath during exertion;Long Term: Able to use Dyspnea scale to guide intensity level when exercising independently       Knowledge and understanding of Target Heart Rate Range (THRR) Yes       Intervention Provide education and explanation of THRR including how the numbers were predicted and where they are located for reference       Expected Outcomes Short Term: Able to state/look up THRR;Long Term: Able to use THRR to govern intensity when exercising independently;Short Term: Able to use daily as guideline for intensity in rehab       Understanding of Exercise Prescription Yes       Intervention Provide education, explanation, and written materials on patient's individual exercise prescription       Expected Outcomes Short Term: Able to explain program exercise prescription;Long Term: Able to explain home exercise prescription to exercise independently                Exercise Goals Re-Evaluation :   Discharge Exercise Prescription (Final Exercise Prescription Changes):   Nutrition:  Target Goals: Understanding of nutrition guidelines, daily intake of sodium <1550m, cholesterol <2034m calories 30% from fat and 7% or less from saturated fats, daily to have 5 or more servings of fruits and vegetables.  Biometrics:  Pre Biometrics - 10/16/22 1124       Pre Biometrics   Waist Circumference 40 inches    Hip Circumference 39 inches    Waist to Hip Ratio 1.03 %    Triceps Skinfold 16 mm    % Body Fat 29.2 %    Grip Strength 35 kg    Flexibility 5 in    Single Leg Stand 12 seconds              Nutrition Therapy Plan and Nutrition Goals:   Nutrition Assessments:  MEDIFICTS Score Key: ?70 Need to make dietary changes  40-70 Heart Healthy Diet ? 40 Therapeutic Level Cholesterol Diet     Picture Your Plate Scores: <[id number] dietary pattern with much room for improvement. 41-50 Dietary pattern unlikely to meet recommendations for good health and room for improvement. 51-60 More healthful dietary pattern, with some room for improvement.  >60 Healthy dietary pattern, although there may be some specific behaviors that could be improved.    Nutrition Goals Re-Evaluation:   Nutrition Goals Re-Evaluation:   Nutrition Goals Discharge (Final Nutrition Goals Re-Evaluation):   Psychosocial: Target Goals: Acknowledge presence or absence of significant depression and/or stress, maximize coping skills, provide positive support system. Participant is able to verbalize types and ability to use techniques and skills needed for reducing stress and depression.  Initial Review & Psychosocial Screening:  Initial Psych Review & Screening - 10/16/22 1312       Initial Review   Current issues  with None Identified      Family Dynamics   Good Support System? Yes   Wille Glaser has his wife for support     Barriers   Psychosocial barriers to participate in program There are no identifiable barriers or psychosocial needs.      Screening Interventions   Interventions Encouraged to exercise             Quality of Life Scores:  Quality of Life - 10/16/22 1447       Quality of Life   Select Quality of Life      Quality of Life Scores   Health/Function Pre 28.47 %    Socioeconomic Pre 29.58 %    Psych/Spiritual Pre 30 %    Family Pre 28.8 %    GLOBAL Pre 29.05 %            Scores of 19 and below usually indicate a poorer quality of life in these areas.  A difference of  2-3 points is a clinically meaningful difference.  A difference of 2-3 points in the total score of the Quality of Life Index has been associated with significant improvement in overall quality of life, self-image, physical symptoms, and general health in studies assessing change in quality of  life.  PHQ-9: Review Flowsheet  More data exists      10/16/2022 03/21/2022 03/20/2021 03/14/2021 10/04/2020  Depression screen PHQ 2/9  Decreased Interest 0 0 0 0 0 0  Down, Depressed, Hopeless 0 0 0 0 0 0  PHQ - 2 Score 0 0 0 0 0 0  Altered sleeping 0 0 - - - -  Tired, decreased energy 0 0 - - - -  Change in appetite 0 0 - - - -  Feeling bad or failure about yourself  0 0 - - - -  Trouble concentrating 0 0 - - - -  Moving slowly or fidgety/restless 0 0 - - - -  Suicidal thoughts 0 0 - - - -  PHQ-9 Score 0 0 - - - -  Difficult doing work/chores Not difficult at all - - - -   Interpretation of Total Score  Total Score Depression Severity:  1-4 = Minimal depression, 5-9 = Mild depression, 10-14 = Moderate depression, 15-19 = Moderately severe depression, 20-27 = Severe depression   Psychosocial Evaluation and Intervention:   Psychosocial Re-Evaluation:   Psychosocial Discharge (Final Psychosocial Re-Evaluation):   Vocational Rehabilitation: Provide vocational rehab assistance to qualifying candidates.   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 Joe does volunteer work            Education: Education Goals: Education classes will be provided on a weekly basis, covering required topics. Participant will state understanding/return demonstration of topics presented.     Core Videos: Exercise    Move It!  Clinical staff conducted group or individual video education with verbal and written material and guidebook.  Patient learns the recommended Pritikin exercise program. Exercise with the goal of living a long, healthy life. Some of the health benefits of exercise include controlled diabetes, healthier blood pressure levels, improved cholesterol  levels, improved heart and lung capacity, improved sleep, and better body composition. Everyone should speak with their doctor before starting or changing an  exercise routine.  Biomechanical Limitations Clinical staff conducted group or individual video education with verbal and written material and guidebook.  Patient learns how biomechanical limitations can impact exercise and how we can mitigate and possibly overcome limitations to have an impactful and balanced exercise routine.  Body Composition Clinical staff conducted group or individual video education with verbal and written material and guidebook.  Patient learns that body composition (ratio of muscle mass to fat mass) is a key component to assessing overall fitness, rather than body weight alone. Increased fat mass, especially visceral belly fat, can put [place] at increased risk for metabolic syndrome, type 2 diabetes, heart disease, and even death. It is recommended to combine diet and exercise (cardiovascular and resistance training) to improve your body composition. Seek guidance from your physician and exercise physiologist before implementing an exercise routine.  Exercise Action Plan Clinical staff conducted group or individual video education with verbal and written material and guidebook.  Patient learns the recommended strategies to achieve and enjoy long-term exercise adherence, including variety, self-motivation, self-efficacy, and positive decision making. Benefits of exercise include fitness, good health, weight management, more energy, better sleep, less stress, and overall well-being.  Medical   Heart Disease Risk Reduction Clinical staff conducted group or individual video education with verbal and written material and guidebook.  Patient learns our heart is our most vital organ as it circulates oxygen, nutrients, white blood cells, and hormones throughout the entire body, and carries waste away. Data supports a plant-based  eating plan like the Pritikin Program for its effectiveness in slowing progression of and reversing heart disease. The video provides a number of recommendations to address heart disease.   Metabolic Syndrome and Belly Fat  Clinical staff conducted group or individual video education with verbal and written material and guidebook.  Patient learns what metabolic syndrome is, how it leads to heart disease, and how one can reverse it and keep it from coming back. You have metabolic syndrome if you have 3 of the following 5 criteria: abdominal obesity, high blood pressure, high triglycerides, low HDL cholesterol, and high blood sugar.  Hypertension and Heart Disease Clinical staff conducted group or individual video education with verbal and written material and guidebook.  Patient learns that high blood pressure, or hypertension, is very common in the [place]. Hypertension is largely due to excessive salt intake, but other important risk factors include being overweight, physical inactivity, drinking too much alcohol, smoking, and not eating enough potassium from fruits and vegetables. High blood pressure is a leading risk factor for heart attack, stroke, congestive heart failure, dementia, kidney failure, and premature death. Long-term effects of excessive salt intake include stiffening of the arteries and thickening of heart muscle and organ damage. Recommendations include ways to reduce hypertension and the risk of heart disease.  Diseases of Our Time - Focusing on Diabetes Clinical staff conducted group or individual video education with verbal and written material and guidebook.  Patient learns why the best way to stop diseases of our time is prevention, through food and other lifestyle changes. Medicine (such as prescription pills and surgeries) is often only a Band-Aid on the problem, not a long-term solution. Most common diseases of our time include obesity, type 2 diabetes, hypertension,  heart disease, and cancer. The Pritikin Program is recommended and has been proven to help reduce, reverse, and/or prevent the damaging effects of metabolic syndrome.  Nutrition   Overview of the Pritikin Eating Plan  Clinical staff conducted group or individual  video education with verbal and written material and guidebook.  Patient learns about the [place] for disease risk reduction. The [place] emphasizes a wide variety of unrefined, minimally-processed carbohydrates, like fruits, vegetables, whole grains, and legumes. Go, Caution, and Stop food choices are explained. Plant-based and lean animal proteins are emphasized. Rationale provided for low sodium intake for blood pressure control, low added sugars for blood sugar stabilization, and low added fats and oils for coronary artery disease risk reduction and weight management.  Calorie Density  Clinical staff conducted group or individual video education with verbal and written material and guidebook.  Patient learns about calorie density and how it impacts the Pritikin Eating Plan. Knowing the characteristics of the food you choose will help you decide whether those foods will lead to weight gain or weight loss, and whether you want to consume more or less of them. Weight loss is usually a side effect of the Pritikin Eating Plan because of its focus on low calorie-dense foods.  Label Reading  Clinical staff conducted group or individual video education with verbal and written material and guidebook.  Patient learns about the Pritikin recommended label reading guidelines and corresponding recommendations regarding calorie density, added sugars, sodium content, and whole grains.  Dining Out - Part 1  Clinical staff conducted group or individual video education with verbal and written material and guidebook.  Patient learns that restaurant meals can be sabotaging because they can be so high in calories, fat, sodium, and/or  sugar. Patient learns recommended strategies on how to positively address this and avoid unhealthy pitfalls.  Facts on Fats  Clinical staff conducted group or individual video education with verbal and written material and guidebook.  Patient learns that lifestyle modifications can be just as effective, if not more so, as many medications for lowering your risk of heart disease. A Pritikin lifestyle can help to reduce your risk of inflammation and atherosclerosis (cholesterol build-up, or plaque, in the artery walls). Lifestyle interventions such as dietary choices and physical activity address the cause of atherosclerosis. A review of the types of fats and their impact on blood cholesterol levels, along with dietary recommendations to reduce fat intake is also included.  Nutrition Action Plan  Clinical staff conducted group or individual video education with verbal and written material and guidebook.  Patient learns how to incorporate Pritikin recommendations into their lifestyle. Recommendations include planning and keeping personal health goals in mind as an important part of their success.  Healthy Mind-Set    Healthy Minds, Bodies, Hearts  Clinical staff conducted group or individual video education with verbal and written material and guidebook.  Patient learns how to identify when they are stressed. Video will discuss the impact of that stress, as well as the many benefits of stress management. Patient will also be introduced to stress management techniques. The way we think, act, and feel has an impact on our hearts.  How Our Thoughts Can Heal Our Hearts  Clinical staff conducted group or individual video education with verbal and written material and guidebook.  Patient learns that negative thoughts can cause depression and anxiety. This can result in negative lifestyle behavior and serious health problems. Cognitive behavioral therapy is an effective method to help control our thoughts in  order to change and improve our emotional outlook.  Additional Videos:  Exercise    Improving Performance  Clinical staff conducted group or individual video education with verbal and written material and guidebook.  Patient learns to  use a non-linear approach by alternating intensity levels and lengths of time spent exercising to help burn more calories and lose more body fat. Cardiovascular exercise helps improve heart health, metabolism, hormonal balance, blood sugar control, and recovery from fatigue. Resistance training improves strength, endurance, balance, coordination, reaction time, metabolism, and muscle mass. Flexibility exercise improves circulation, posture, and balance. Seek guidance from your physician and exercise physiologist before implementing an exercise routine and learn your capabilities and proper form for all exercise.  Introduction to Yoga  Clinical staff conducted group or individual video education with verbal and written material and guidebook.  Patient learns about yoga, a discipline of the coming together of mind, breath, and body. The benefits of yoga include improved flexibility, improved range of motion, better posture and core strength, increased lung function, weight loss, and positive self-image. Yoga's heart health benefits include lowered blood pressure, healthier heart rate, decreased cholesterol and triglyceride levels, improved immune function, and reduced stress. Seek guidance from your physician and exercise physiologist before implementing an exercise routine and learn your capabilities and proper form for all exercise.  Medical   Aging: Enhancing Your Quality of Life  Clinical staff conducted group or individual video education with verbal and written material and guidebook.  Patient learns key strategies and recommendations to stay in good physical health and enhance quality of life, such as prevention strategies, having an advocate, securing a [place], and keeping a list of medications and system for tracking them. It also discusses how to avoid risk for bone loss.  Biology of Weight Control  Clinical staff conducted group or individual video education with verbal and written material and guidebook.  Patient learns that weight gain occurs because we consume more calories than we burn (eating more, moving less). Even if your body weight is normal, you may have higher ratios of fat compared to muscle mass. Too much body fat puts you at increased risk for cardiovascular disease, heart attack, stroke, type 2 diabetes, and obesity-related cancers. In addition to exercise, following the [place] can help reduce your risk.  Decoding Lab Results  Clinical staff conducted group or individual video education with verbal and written material and guidebook.  Patient learns that lab test reflects one measurement whose values change over time and are influenced by many factors, including medication, stress, sleep, exercise, food, hydration, pre-existing medical conditions, and more. It is recommended to use the knowledge from this video to become more involved with your lab results and evaluate your numbers to speak with your doctor.   Diseases of Our Time - Overview  Clinical staff conducted group or individual video education with verbal and written material and guidebook.  Patient learns that according to the CDC, 50% to 70% of chronic diseases (such as obesity, type 2 diabetes, elevated lipids, hypertension, and heart disease) are avoidable through lifestyle improvements including healthier food choices, listening to satiety cues, and increased physical activity.  Sleep Disorders Clinical staff conducted group or individual video education with verbal and written material and guidebook.  Patient learns how good quality and duration of sleep are important to overall health and well-being. Patient also learns  about sleep disorders and how they impact health along with recommendations to address them, including discussing with a physician.  Nutrition  Dining Out - Part 2 Clinical staff conducted group or individual video education with verbal and written material and guidebook.  Patient learns how to plan ahead and communicate in  order to maximize their dining experience in a healthy and nutritious manner. Included are recommended food choices based on the type of restaurant the patient is visiting.   Fueling a Best boy conducted group or individual video education with verbal and written material and guidebook.  There is a strong connection between our food choices and our health. Diseases like obesity and type 2 diabetes are very prevalent and are in large-part due to lifestyle choices. The Pritikin Eating Plan provides plenty of food and hunger-curbing satisfaction. It is easy to follow, affordable, and helps reduce health risks.  Menu Workshop  Clinical staff conducted group or individual video education with verbal and written material and guidebook.  Patient learns that restaurant meals can sabotage health goals because they are often packed with calories, fat, sodium, and sugar. Recommendations include strategies to plan ahead and to communicate with the manager, chef, or server to help order a healthier meal.  Planning Your Eating Strategy  Clinical staff conducted group or individual video education with verbal and written material and guidebook.  Patient learns about the [place] and its benefit of reducing the risk of disease. The [place] does not focus on calories. Instead, it emphasizes high-quality, nutrient-rich foods. By knowing the characteristics of the foods, we choose, we can determine their calorie density and make informed decisions.  Targeting Your Nutrition Priorities  Clinical staff conducted group or individual video education with  verbal and written material and guidebook.  Patient learns that lifestyle habits have a tremendous impact on disease risk and progression. This video provides eating and physical activity recommendations based on your personal health goals, such as reducing LDL cholesterol, losing weight, preventing or controlling type 2 diabetes, and reducing high blood pressure.  Vitamins and Minerals  Clinical staff conducted group or individual video education with verbal and written material and guidebook.  Patient learns different ways to obtain key vitamins and minerals, including through a recommended healthy diet. It is important to discuss all supplements you take with your doctor.   Healthy Mind-Set    Smoking Cessation  Clinical staff conducted group or individual video education with verbal and written material and guidebook.  Patient learns that cigarette smoking and tobacco addiction pose a serious health risk which affects millions of people. Stopping smoking will significantly reduce the risk of heart disease, lung disease, and many forms of cancer. Recommended strategies for quitting are covered, including working with your doctor to develop a successful plan.  Culinary   Becoming a Financial trader conducted group or individual video education with verbal and written material and guidebook.  Patient learns that cooking at home can be healthy, cost-effective, quick, and puts them in control. Keys to cooking healthy recipes will include looking at your recipe, assessing your equipment needs, planning ahead, making it simple, choosing cost-effective seasonal ingredients, and limiting the use of added fats, salts, and sugars.  Cooking - Breakfast and Snacks  Clinical staff conducted group or individual video education with verbal and written material and guidebook.  Patient learns how important breakfast is to satiety and nutrition through the entire day. Recommendations include key  foods to eat during breakfast to help stabilize blood sugar levels and to prevent overeating at meals later in the day. Planning ahead is also a key component.  Cooking - Human resources officer conducted group or individual video education with verbal and written material and guidebook.  Patient learns eating  strategies to improve overall health, including an approach to cook more at home. Recommendations include thinking of animal protein as a side on your plate rather than center stage and focusing instead on lower calorie dense options like vegetables, fruits, whole grains, and plant-based proteins, such as beans. Making sauces in large quantities to freeze for later and leaving the skin on your vegetables are also recommended to maximize your experience.  Cooking - Healthy Salads and Dressing Clinical staff conducted group or individual video education with verbal and written material and guidebook.  Patient learns that vegetables, fruits, whole grains, and legumes are the foundations of the [place]. Recommendations include how to incorporate each of these in flavorful and healthy salads, and how to create homemade salad dressings. Proper handling of ingredients is also covered. Cooking - Soups and [employer] - Soups and Desserts Clinical staff conducted group or individual video education with verbal and written material and guidebook.  Patient learns that Pritikin soups and desserts make for easy, nutritious, and delicious snacks and meal components that are low in sodium, fat, sugar, and calorie density, while high in vitamins, minerals, and filling fiber. Recommendations include simple and healthy ideas for soups and desserts.   Overview     The Pritikin Solution Program Overview Clinical staff conducted group or individual video education with verbal and written material and guidebook.  Patient learns that the results of the [place] Program have been  documented in more than 100 articles published in peer-reviewed journals, and the benefits include reducing risk factors for (and, in some cases, even reversing) high cholesterol, high blood pressure, type 2 diabetes, obesity, and more! An overview of the three key pillars of the Pritikin Program will be covered: eating well, doing regular exercise, and having a healthy mind-set.  WORKSHOPS  Exercise: Exercise Basics: Building Your Action Plan Clinical staff led group instruction and group discussion with PowerPoint presentation and patient guidebook. To enhance the learning environment the use of posters, models and videos may be added. At the conclusion of this workshop, patients will comprehend the difference between physical activity and exercise, as well as the benefits of incorporating both, into their routine. Patients will understand the FITT (Frequency, Intensity, Time, and Type) principle and how to use it to build an exercise action plan. In addition, safety concerns and other considerations for exercise and cardiac rehab will be addressed by the presenter. The purpose of this lesson is to promote a comprehensive and effective weekly exercise routine in order to improve patients' overall level of fitness.   Managing Heart Disease: Your Path to a Healthier Heart Clinical staff led group instruction and group discussion with PowerPoint presentation and patient guidebook. To enhance the learning environment the use of posters, models and videos may be added.At the conclusion of this workshop, patients will understand the anatomy and physiology of the heart. Additionally, they will understand how Pritikin's three pillars impact the risk factors, the progression, and the management of heart disease.  The purpose of this lesson is to provide a high-level overview of the heart, heart disease, and how the Pritikin lifestyle positively impacts risk factors.  Exercise Biomechanics Clinical  staff led group instruction and group discussion with PowerPoint presentation and patient guidebook. To enhance the learning environment the use of posters, models and videos may be added. Patients will learn how the structural parts of their bodies function and how these functions impact their daily activities, movement, and exercise. Patients will learn how to  promote a neutral spine, learn how to manage pain, and identify ways to improve their physical movement in order to promote healthy living. The purpose of this lesson is to expose patients to common physical limitations that impact physical activity. Participants will learn practical ways to adapt and manage aches and pains, and to minimize their effect on regular exercise. Patients will learn how to maintain good posture while sitting, walking, and lifting.  Balance Training and Fall Prevention  Clinical staff led group instruction and group discussion with PowerPoint presentation and patient guidebook. To enhance the learning environment the use of posters, models and videos may be added. At the conclusion of this workshop, patients will understand the importance of their sensorimotor skills (vision, proprioception, and the vestibular system) in maintaining their ability to balance as they age. Patients will apply a variety of balancing exercises that are appropriate for their current level of function. Patients will understand the common causes for poor balance, possible solutions to these problems, and ways to modify their physical environment in order to minimize their fall risk. The purpose of this lesson is to teach patients about the importance of maintaining balance as they age and ways to minimize their risk of falling.  WORKSHOPS   Nutrition:  Fueling a Scientist, research (physical sciences) led group instruction and group discussion with PowerPoint presentation and patient guidebook. To enhance the learning environment the use of  posters, models and videos may be added. Patients will review the foundational principles of the [place] and understand what constitutes a serving size in each of the food groups. Patients will also learn Pritikin-friendly foods that are better choices when away from home and review make-ahead meal and snack options. Calorie density will be reviewed and applied to three nutrition priorities: weight maintenance, weight loss, and weight gain. The purpose of this lesson is to reinforce (in a group setting) the key concepts around what patients are recommended to eat and how to apply these guidelines when away from home by planning and selecting Pritikin-friendly options. Patients will understand how calorie density may be adjusted for different weight management goals.  Mindful Eating  Clinical staff led group instruction and group discussion with PowerPoint presentation and patient guidebook. To enhance the learning environment the use of posters, models and videos may be added. Patients will briefly review the concepts of the [place] and the importance of low-calorie dense foods. The concept of mindful eating will be introduced as well as the importance of paying attention to internal hunger signals. Triggers for non-hunger eating and techniques for dealing with triggers will be explored. The purpose of this lesson is to provide patients with the opportunity to review the basic principles of the [place], discuss the value of eating mindfully and how to measure internal cues of hunger and fullness using the Hunger Scale. Patients will also discuss reasons for non-hunger eating and learn strategies to use for controlling emotional eating.  Targeting Your Nutrition Priorities Clinical staff led group instruction and group discussion with PowerPoint presentation and patient guidebook. To enhance the learning environment the use of posters, models and videos may be added.  Patients will learn how to determine their genetic susceptibility to disease by reviewing their family history. Patients will gain insight into the importance of diet as part of an overall healthy lifestyle in mitigating the impact of genetics and other environmental insults. The purpose of this lesson is to provide patients with the opportunity to assess their  personal nutrition priorities by looking at their family history, their own health history and current risk factors. Patients will also be able to discuss ways of prioritizing and modifying the [place] for their highest risk areas  Menu  Clinical staff led group instruction and group discussion with PowerPoint presentation and patient guidebook. To enhance the learning environment the use of posters, models and videos may be added. Using menus brought in from [employer], or printed from [employer], patients will apply the [place] dining out guidelines that were presented in the [employer] video. Patients will also be able to practice these guidelines in a variety of provided scenarios. The purpose of this lesson is to provide patients with the opportunity to practice hands-on learning of the [place] with actual menus and practice scenarios.  Label Reading Clinical staff led group instruction and group discussion with PowerPoint presentation and patient guidebook. To enhance the learning environment the use of posters, models and videos may be added. Patients will review and discuss the Pritikin label reading guidelines presented in Pritikin's Label Reading Educational series video. Using fool labels brought in from local grocery stores and markets, patients will apply the label reading guidelines and determine if the packaged food meet the Pritikin guidelines. The purpose of this lesson is to provide patients with the opportunity to review, discuss, and practice hands-on learning of the  Pritikin Label Reading guidelines with actual packaged food labels. [place] Workshops are designed to teach patients ways to prepare quick, simple, and affordable recipes at home. The importance of nutrition's role in chronic disease risk reduction is reflected in its emphasis in the overall Pritikin program. By learning how to prepare essential core Pritikin Eating Plan recipes, patients will increase control over what they eat; be able to customize the flavor of foods without the use of added salt, sugar, or fat; and improve the quality of the food they consume. By learning a set of core recipes which are easily assembled, quickly prepared, and affordable, patients are more likely to prepare more healthy foods at home. These workshops focus on convenient breakfasts, simple entres, side dishes, and desserts which can be prepared with minimal effort and are consistent with nutrition recommendations for cardiovascular risk reduction. Cooking [employer] are taught by a Engineer, materials (RD) who has been trained by the [employer]. The chef or RD has a clear understanding of the importance of minimizing - if not completely eliminating - added fat, sugar, and sodium in recipes. Throughout the series of [place] Workshop sessions, patients will learn about healthy ingredients and efficient methods of cooking to build confidence in their capability to prepare    Cooking School weekly topics:  Adding Flavor- Sodium-Free  Fast and Healthy Breakfasts  Powerhouse Plant-Based Proteins  Satisfying Salads and Dressings  Simple Sides and Sauces  International Cuisine-Spotlight on the [place] Zones  Delicious Desserts  Savory Soups  Efficiency Cooking - Meals in a Snap  Tasty Appetizers and Snacks  Comforting Weekend Breakfasts  One-Pot Wonders   Fast Evening Meals  Easy [place] (Psychosocial): New Thoughts, New Behaviors Clinical staff led group instruction and group discussion with PowerPoint presentation and patient guidebook. To enhance the learning environment the use of posters, models and videos may be added. Patients will learn and practice techniques for developing effective health and lifestyle goals. Patients  will be able to effectively apply the goal setting process learned to develop at least one new personal goal.  The purpose of this lesson is to expose patients to a new skill set of behavior modification techniques such as techniques setting SMART goals, overcoming barriers, and achieving new thoughts and new behaviors.  Managing Moods and Relationships Clinical staff led group instruction and group discussion with PowerPoint presentation and patient guidebook. To enhance the learning environment the use of posters, models and videos may be added. Patients will learn how emotional and chronic stress factors can impact their health and relationships. They will learn healthy ways to manage their moods and utilize positive coping mechanisms. In addition, ICR patients will learn ways to improve communication skills. The purpose of this lesson is to expose patients to ways of understanding how one's mood and health are intimately connected. Developing a healthy outlook can help build positive relationships and connections with others. Patients will understand the importance of utilizing effective communication skills that include actively listening and being heard. They will learn and understand the importance of the "4 Cs" and especially Connections in fostering of a Healthy Mind-Set.  Healthy Sleep for a Healthy Heart Clinical staff led group instruction and group discussion with PowerPoint presentation and patient guidebook. To enhance the learning environment the use of posters, models and videos may be added. At the conclusion of this workshop,  patients will be able to demonstrate knowledge of the importance of sleep to overall health, well-being, and quality of life. They will understand the symptoms of, and treatments for, common sleep disorders. Patients will also be able to identify daytime and nighttime behaviors which impact sleep, and they will be able to apply these tools to help manage sleep-related challenges. The purpose of this lesson is to provide patients with a general overview of sleep and outline the importance of quality sleep. Patients will learn about a few of the most common sleep disorders. Patients will also be introduced to the concept of "sleep hygiene," and discover ways to self-manage certain sleeping problems through simple daily behavior changes. Finally, the workshop will motivate patients by clarifying the links between quality sleep and their goals of heart-healthy living.   Recognizing and Reducing Stress Clinical staff led group instruction and group discussion with PowerPoint presentation and patient guidebook. To enhance the learning environment the use of posters, models and videos may be added. At the conclusion of this workshop, patients will be able to understand the types of stress reactions, differentiate between acute and chronic stress, and recognize the impact that chronic stress has on their health. They will also be able to apply different coping mechanisms, such as reframing negative self-talk. Patients will have the opportunity to practice a variety of stress management techniques, such as deep abdominal breathing, progressive muscle relaxation, and/or guided imagery.  The purpose of this lesson is to educate patients on the role of stress in their lives and to provide healthy techniques for coping with it.  Learning Barriers/Preferences:  Learning Barriers/Preferences - 10/16/22 1448       Learning Barriers/Preferences   Learning Barriers Sight;Exercise Concerns   balance, 12 seconds single leg  stand, unsteady gait   Learning Preferences Pictoral;Video;Written Material;Skilled Demonstration;Individual Instruction;Group Instruction             Education Topics:  Knowledge Questionnaire Score:  Knowledge Questionnaire Score - 10/16/22 1450       Knowledge Questionnaire Score   Pre Score 21/24  Core Components/Risk Factors/Patient Goals at Admission:  Personal Goals and Risk Factors at Admission - 10/16/22 1451       Core Components/Risk Factors/Patient Goals on Admission    Weight Management Yes    Intervention Weight Management: Develop a combined nutrition and exercise program designed to reach desired caloric intake, while maintaining appropriate intake of nutrient and fiber, sodium and fats, and appropriate energy expenditure required for the weight goal.;Weight Management: Provide education and appropriate resources to help participant work on and attain dietary goals.    Expected Outcomes Short Term: Continue to assess and modify interventions until short term weight is achieved;Long Term: Adherence to nutrition and physical activity/exercise program aimed toward attainment of established weight goal    Improve shortness of breath with ADL's Yes    Intervention Provide education, individualized exercise plan and daily activity instruction to help decrease symptoms of SOB with activities of daily living.    Expected Outcomes Short Term: Improve cardiorespiratory fitness to achieve a reduction of symptoms when performing ADLs;Long Term: Be able to perform more ADLs without symptoms or delay the onset of symptoms    Hypertension Yes    Intervention Provide education on lifestyle modifcations including regular physical activity/exercise, weight management, moderate sodium restriction and increased consumption of fresh fruit, vegetables, and low fat dairy, alcohol moderation, and smoking cessation.;Monitor prescription use compliance.    Expected Outcomes  Short Term: Continued assessment and intervention until BP is < 140/53m HG in hypertensive participants. < 130/825mHG in hypertensive participants with diabetes, heart failure or chronic kidney disease.;Long Term: Maintenance of blood pressure at goal levels.    Lipids Yes    Intervention Provide education and support for participant on nutrition & aerobic/resistive exercise along with prescribed medications to achieve LDL <7068mHDL >63m39m  Expected Outcomes Short Term: Participant states understanding of desired cholesterol values and is compliant with medications prescribed. Participant is following exercise prescription and nutrition guidelines.;Long Term: Cholesterol controlled with medications as prescribed, with individualized exercise RX and with personalized nutrition plan. Value goals: LDL < 70mg73mL > 40 mg.    Personal Goal Other Yes    Personal Goal Short Term: Understand limits to exercise. Long term: Strength, Stamina    Intervention Will continue to monitor pt and progress WL as tolerated    Expected Outcomes Pt will acheive goals             Core Components/Risk Factors/Patient Goals Review:    Core Components/Risk Factors/Patient Goals at Discharge (Final Review):    ITP Comments:  ITP Comments     Row Name 10/16/22 0931           ITP Comments Dr. TraciFransico Himcal director. Introduction to pritikin education program/intensive cardiac rehab. Initial orientation packet reviewed with patient.                Comments: Participant attended orientation for the cardiac rehabilitation program on  10/16/2022  to perform initial intake and exercise walk test. Patient introduced to the [facility] and orientation packet was reviewed. Completed 6-minute walk test, measurements, initial ITP, and exercise prescription. Vital signs stable. Telemetry-normal sinus  brady, Sinus rhythm, Joe reported having chest fullness/ chest tightness during 6 minute  walk test please see previous documentation as Mr JenkiHassingerst pressure resolved with rest. On Site provider Dr GeorgInda Merlinfied.MariaHarrell GaveSN   Service time was from 0921 [phone].

## 2022-10-16 NOTE — Telephone Encounter (Signed)
Error

## 2022-10-18 NOTE — Progress Notes (Unsigned)
Cardiology Office Note:    Date:  10/21/2022   ID:  [name], DOB 22-Nov-1940, MRN [id number]  PCP:  [name], MD   [place] Providers Cardiologist:  Minus Breeding, MD Cardiology APP:  [name], [place] {  Referring MD: [name], MD   Chief Complaint  Patient presents with   Chest Pain    During cardiac rehab     History of Present Illness:    [name] is a 82 y.o. male with a hx of CAD, HLD, HTN, Barrett's esophagus, CKD stage III, anemia, GERD, duodenal ulcer. Patient is followed by Dr. [name], and presents today for evaluation of chest discomfort at cardiac rehab.   Per chart review, patient had a BMS to the RCA in 2002, DES to the diagonal and PTCA of the circumflex in 2006. Ankle brachial index in 02/2019 showed 1.19 (normal in right foot), 0.98 (mild) and left foot.  Echocardiogram from 11/06/2020 showed EF 60-65%, no regional wall motion abnormalities, mild LVH, grade 2 diastolic dysfunction, normal RV systolic function, moderately dilated left atrium, mild mitral valve regurgitation, mild aortic stenosis, mild dilation of the ascending aorta measuring 40 mm.  Patient was was seen in the office on 09/02/22 and complained of chest pain and dyspnea. Symptoms were concerning for unstable angina, so he was set up for outpatient cardiac catheterization. Underwent cardiac catheterization with Dr. [name] on 1/10 and was found to have severe bifurcation LAD-D1 disease with 95% stenosis. He was treated with two-vessel PCI/DES x1 to the proximal D1/proximal LAD. There was a patent proximal stent in the RI with distal in-stent restenosis of 65%. Of note, have a low threshold to consider PCI-DES of RI lesion.  Recommended DAPT with ASA/plavix for at least 6 months. Patient was seen by Fabian Sharp PA-C for follow up on 1/23. At that time, patient was doing well.   Patient was at cardiac rehab on 2/22, and he developed chest discomfort during his 6  minute walk test. Described the discomfort as mid sternal and non radiating, about a 3-4/10 on pain scale. Pain resolved with rest. Patient was noted to be taking both atenolol and toprol, and was instructed to only take atenolol.  Patient reports that prior to having his stents placed in January, he was having significant chest pain and shortness of breath on minimal exertion.  Is having his stents placed, he has been able to walk around his home, exercise at his gym, and go for walks around his neighborhood without having chest discomfort or excessive shortness of breath.  He does feel a little deconditioned, but reports that this has been improving with exercise.  He takes over 6000 steps daily.  When he goes to the gym, he will ride a stationary bicycle, walk on the treadmill, with some weights and do core exercises.  Denies having any chest discomfort with these activities.  When he went to orientation for cardiac rehab, he was doing a 6-minute walk test when he developed some chest discomfort.  Reports that he pushed himself too hard during the walk test, and felt like he was just deconditioned.  His chest discomfort was located under his sternum and a bit like tightness, it was not similar to what he felt prior to having his stents placed.  When he told cardiac rehab he had chest discomfort, they had him stop exercising and he has not been back since.  However, he is continue to exercise on his own  and walk around his neighborhood, and has not been having issues.  Denies palpitations, syncope, near syncope, dizziness.  He takes his blood pressure regularly and brought a blood pressure log to his appointment today.  Blood pressure log shows that his lowest blood pressure was 118/62 and his highest blood pressure was 150/81.    Past Medical History:  Diagnosis Date   Anemia    Barrett's esophagus    Blood transfusion without reported diagnosis    CAD (coronary artery disease)    2002 stenting of the  RCA,, 2006 Stenting of diag with DES, PTCA of Circ, 50% LAD   Cancer ([place])    skin cancer scalp and forehead   Chronic kidney disease    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 - decreased dose of HCTZ to M,F   Diverticulosis    DJD (degenerative joint disease)    Duodenal ulcer    ED (erectile dysfunction)    GERD (gastroesophageal reflux disease)    increased s/s in the last week 03-22-19 PV    Hemorrhoids    Hyperlipidemia    Hypertension    Myocardial infarction ([place])    2002   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   mid frontal scalp-cx82f   SCC (squamous cell carcinoma) 06/15/2019   front middle scalp-tx cx39f  SCC (squamous cell carcinoma) 12/27/2002   CIS-right angle of jaw--[id number] SCC (squamous cell carcinoma) 09/24/2004   CIS--mid post crown, left mis crown medial,left first web space-tx [id number] (squamous cell carcinoma) 06/03/2006   SCCA-right temple-tx [id number] (squamous cell carcinoma) 06/03/2006   CIS-right forehead, left mis crown scalp medial-tx cx35fu 40fC (squamous cell carcinoma) 07/30/2006   right temple lateral-txpbx   SCC (squamous cell carcinoma) 11/10/2006   left forehead, left hand proximal   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/02/2009   left scalp-cx35fu  83f (squamous cell carcinoma) 01/24/2015   left scalp-cx35fu   13f(squamous cell carcinoma) 11/12/2015   front lateralscalp-cx35fu, me66f scalp-cx35fu   SC40fquamous cell carcinoma) 12/17/2016   front center scalp-cx35fu   Squ44fs cell carcinoma of skin 08/10/2017   CIS--right ear rim, top of scalp, left side scalp-tx-cx35fu    Pas15frgical History:  Procedure Laterality Date   CARDIAC CATHETERIZATION     COLONOSCOPY     CORONARY STENT INTERVENTION N/A 09/03/2022   Procedure: CORONARY STENT INTERVENTION;  Surgeon: Harding, DavLeonie Manion: [facility] INVASIVE [place]: Cardiovascular;  Laterality: N/A;   ELBOW ARTHROSCOPY     heart stents     2002,2004   LEFT HEART CATH AND  CORONARY ANGIOGRAPHY N/A 09/03/2022   Procedure: LEFT HEART CATH AND CORONARY ANGIOGRAPHY;  Surgeon: Harding, DavLeonie Manion: [facility] INVASIVE [place]: Cardiovascular;  Laterality: N/A;   SPINAL FUSION     SPINAL FUSION     SPINE SURGERY     cervical   UPPER GASTROINTESTINAL ENDOSCOPY      Current Medications: Current Meds  Medication Sig   amLODipine (NORVASC) 10 MG tablet TAKE ONE TABLET BY MOUTH EVERYDAY AT BEDTIME   aspirin EC 81 MG tablet Take 81 mg by mouth daily. Swallow whole.   atenolol (TENORMIN) 50 MG tablet Take 50 mg by mouth 2 (two) times daily.   atorvastatin (LIPITOR) 40 MG tablet Take 1 tablet (40 mg total) by mouth at bedtime.   clopidogrel (PLAVIX) 75 MG tablet Take 1 tablet (75 mg total) by  mouth daily with breakfast.   Coenzyme Q10 (CO Q 10 PO) Take 1 tablet by mouth daily.   ferrous sulfate 325 (65 FE) MG tablet Take 1 tablet (325 mg total) by mouth daily with breakfast.   KRILL OIL PO Take 100 mg by mouth daily. Mega Red   lidocaine (HM LIDOCAINE PATCH) 4 % Place 1 patch onto the skin as needed.   Multiple Vitamins-Minerals (CENTRUM SILVER 50+MEN PO) Take 1 tablet by mouth.   Multiple Vitamins-Minerals (PRESERVISION AREDS 2) CAPS Take 1 capsule by mouth 2 (two) times daily.   pantoprazole (PROTONIX) 40 MG tablet TAKE ONE TABLET BY MOUTH EVERY MORNING   Polyethyl Glycol-Propyl Glycol (SYSTANE) 0.4-0.3 % SOLN Place 2 drops into both eyes every morning.   primidone (MYSOLINE) 50 MG tablet TAKE ONE TABLET BY MOUTH EVERY MORNING and TAKE ONE TABLET BY MOUTH EVERYDAY AT BEDTIME   [DISCONTINUED] hydrALAZINE (APRESOLINE) 25 MG tablet TAKE ONE TABLET BY MOUTH EVERY MORNING and TAKE ONE TABLET BY MOUTH EVERYDAY AT BEDTIME   [DISCONTINUED] isosorbide mononitrate (IMDUR) 30 MG 24 hr tablet Take 1 tablet (30 mg total) by mouth daily.   [DISCONTINUED] lisinopril (ZESTRIL) 20 MG tablet TAKE ONE TABLET BY MOUTH EVERYDAY AT BEDTIME     Allergies:   Patient has no  known allergies.   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   Occupation: retired  Tobacco Use   Smoking status: Former    Types: Cigarettes    Quit date: 08/25/1961    Years since quitting: 61.1    Passive exposure: Never   Smokeless tobacco: Never  Vaping Use   Vaping Use: Never used  Substance and Sexual Activity   Alcohol use: Yes    Comment: very seldom   Drug use: No   Sexual activity: Not Currently  Other Topics Concern   Not on file  Social History Narrative   Right handed   1 Story home    live spouse   Social Determinants of Health   Financial Resource Strain: Low Risk  (03/21/2022)   Overall Financial Resource Strain (CARDIA)    Difficulty of Paying Living Expenses: Not hard at all  Food Insecurity: No Food Insecurity (09/03/2022)   Hunger Vital Sign    Worried About Running Out of Food in the Last Year: Never true    [place] in the Last Year: Never true  Transportation Needs: No Transportation Needs (09/03/2022)   PRAPARE - Hydrologist (Medical): No    Lack of Transportation (Non-Medical): No  Physical Activity: Sufficiently Active (03/21/2022)   Exercise Vital Sign    Days of Exercise per Week: 5 days    Minutes of Exercise per Session: 30 min  Stress: No Stress Concern Present (03/21/2022)   Time    Feeling of Stress : Not at all  Social Connections: Unknown (03/21/2022)   Social Connection and Isolation Panel [NHANES]    Frequency of Communication with Friends and Family: More than three times a week    Frequency of Social Gatherings with Friends and Family: More than three times a week    Attends Religious  Services:  Not on file    Active Member of Clubs or Organizations: Yes    Attends Club or Organization Meetings: More than 4 times per year    Marital Status: Married     Family History: The patient's family history includes Alcohol abuse in an other family member; Alzheimer's disease in an other family member; Coronary artery disease in an other family member; Dementia in his mother. There is no history of Colon cancer, Esophageal cancer, Rectal cancer, Stomach cancer, or Colon polyps.  ROS:   Please see the history of present illness.     All other systems reviewed and are negative.  EKGs/Labs/Other Studies Reviewed:    The following studies were reviewed today:  Left Heart Catheterization 09/03/21   Prox LAD lesion is 95% stenosed with 95% stenosed side branch in 1st Diag. 1st Diag lesion is 55% stenosed distal to the bifurcation lesion.   A drug-eluting stent was successfully placed in the ostial and proximal  1st Diag (D1) using a SYNERGY XD 2.25X12.   -- Post intervention, the side branch was reduced to 0% residual stenosis.  (Covering the ostial lesion as well as the the 55% stenosis)   A drug-eluting stent was successfully placed in the proximal LAD from just beyond ramus intermedius to PRN major septal perforator distally.  Crosses to diagonal branches as noted.  Using a SYNERGY XD 2.50X28.  Deployed to 2.7 mm, postdilated proximally to 2.8 mm.   -- Post intervention, there is a 0% residual stenosis in the LAD.   --------------------------------------------   Ramus lesion is 65% stenosed -> distal in-stent restenosis, DES stent   Prox RCA to Mid RCA lesion is 40% stenosed.  Mid RCA bare-metal stent is 5% stenosed. Mid RCA to Dist RCA lesion is 55% stenosed.   1st Diag lesion is 55% stenosed.   ----------------------   The left ventricular systolic function is normal.  LV end diastolic pressure is normal.  There is no aortic valve stenosis.   POST-OPERATIVE DIAGNOSIS: CULPRIT  LESION: Severe bifurcation LAD-D1 ([place] 1, 1, 1) 95% stenosis beginning just distal to Ramus Intermedius (RI) Successful complex bifurcation two-vessel PCI with Synergy 2.25 x 12 DES ostial proximal D1 and Synergy 2.5 mm x 28 mm deployed to 2.7 mm, postdilated proximally to 2.8 mm (unable to rewire the jailed D1 stent and successfully advance balloon across.  However there is TIMI-3 flow and appears to be widely patent.  I do not push too aggressively.. Patent proximal stent in the Ri with distal stent ISR roughly 65%. After discussion with patient's PCP, based on the prolonged bifurcation PCI, we plan to treat the stenosis medically and bring him back to the Cath Lab if symptoms warrant. Widely patent RCA with focal 40% stenosis proximal to an 55% stenosis distal to the previous stent Apparently normal LVEF with mild to moderately elevated LVEDP.   PLAN OF CARE: Admit for overnight observation; DAPT uninterrupted for minimum 6 months, would continue Plavix long-term -Low threshold to consider PTCA/DES PCI of RI lesion.  Diagnostic Dominance: Right  Intervention      Recent Labs: 09/08/2022: BUN 24; Creatinine, Ser 1.22; Potassium 4.1; Sodium 139 09/18/2022: Hemoglobin 12.1; Platelets 312.0  Recent Lipid Panel    Component Value Date/Time   CHOL 123 12/26/2021 0911   TRIG 119.0 12/26/2021 0911   TRIG 161 (H) 08/28/2006 1023   HDL 48.40 12/26/2021 0911   CHOLHDL 3 12/26/2021 0911   VLDL 23.8 12/26/2021 0911   LDLCALC 51 12/26/2021 0911  [place] 60 03/12/2020 1144     Risk Assessment/Calculations:                Physical Exam:    VS:  BP 108/60 (BP Location: Left Arm, Patient Position: Sitting, Cuff Size: Normal)   Pulse (!) 55   Ht '5\' 5"'$  (1.651 m)   Wt 170 lb (77.1 kg)   SpO2 97%   BMI 28.29 kg/m     Wt Readings from Last 3 Encounters:  10/21/22 170 lb (77.1 kg)  10/16/22 173 lb 8 oz (78.7 kg)  09/18/22 174 lb (78.9 kg)     GEN:  Well nourished, well  developed in no acute distress. Sitting upright in the chair  HEENT: Normal NECK: No JVD CARDIAC: RRR, no murmurs, rubs, gallops. Radial pulses 2+ bilaterally  RESPIRATORY:  Clear to auscultation without rales, wheezing or rhonchi. Normal WOB on room air  ABDOMEN: Soft, non-tender, non-distended MUSCULOSKELETAL:  No edema in BLE; No deformity  SKIN: Warm and dry NEUROLOGIC:  Alert and oriented x 3 PSYCHIATRIC:  Normal affect   ASSESSMENT:    1. CAD S/P percutaneous coronary angioplasty   2. Hyperlipidemia with target LDL less than 70   3. Essential hypertension   4. Coronary artery disease involving native coronary artery of native heart without angina pectoris    PLAN:    In order of problems listed above:  CAD  Chest Discomfort  - Patient previously had a BMS to the RCA in 2002, DES to the diagonal and PTCA of the circumflex in 2006 - Underwent cardiac catheterization on 1/10 and was treated with complex bifurcation two-vessel PCI with DES to the LAD-D1. Of note, there was patency of the proximal stent in the RI with distal stent IRS of 65%. Low threshold to consider PTCA/PCI of RI lesion  - Patient reports that prior to having stents placed, he had significant chest pain on minimal exertion.  Since having stents placed, he has been able to walk around his home/neighborhood and exercise at his gym without developing chest pain  - Patient had his orientation for cardiac rehab on 2/22. During his walking test, he had an episode of mild chest discomfort that resolved quickly with rest.  This chest discomfort was not at all similar to what he felt prior to having his stents placed.  He has not had recurrence of chest discomfort since despite continuing to be active  - Increase Imdur to 60 mg daily. BP was 108/60 in the office today, so I decreased lisinopril to 10 mg daily to prevent hypotension  - Continue ASA and plavix for 6 months after PCI (until 7/10). After 6 months, plan to drop  ASA but continue plavix indefinitely  - Continue lipitor 40 mg daily, amlodipine 10 mg daily, atenolol 50 mg BID  - Discussed that if he has recurrence of chest pain that does not resolve with nitro, he should go to the ED for evaluation. Also discussed that Dr. [name] did have a low threshold to consider intervening on RI lesion, but patient would prefer to try medical therapy first as he has not had recurrence of chest discomfort.   HLD  - Goal LDL <70  - Lipid panel from 12/2021 showed total cholesterol 123, HDL 48.8, LDL 51, triglycerides 119  - Lipitor was increased from 20 mg daily to 40 mg daily in 08/2021 - Needs fasting lipid panel and LFTs at next appointment- instructed patient to fast prior to his next appointment  HTN  -Patient brought in blood pressure log that showed lowest blood pressure was 118/62 and the highest blood pressure was 150/80. Denies dizziness, near syncope.  - BP in the office today was 108/60.  - Patient has been taking hydralazine 25 mg in the morning, 20 mg in the afternoon, and 25 mg in the evening. Instructed patient to take hydralazine 25 mg TID - Increased imdur to 60 mg daily  - Decreased lisinopril to 10 mg daily - Continue atenolol 50 mg BID, amlodipine 10 mg daily - Instructed patient to keep a blood pressure log and to call office if SBP sustaining <95   Medication Adjustments/Labs and Tests Ordered: Current medicines are reviewed at length with the patient today.  Concerns regarding medicines are outlined above.  No orders of the defined types were placed in this encounter.  Meds ordered this encounter  Medications   DISCONTD: hydrALAZINE (APRESOLINE) 25 MG tablet    Sig: Take 1 tablet (25 mg total) by mouth 3 (three) times daily.    Dispense:  270 tablet    Refill:  3   DISCONTD: nitroGLYCERIN (NITROSTAT) 0.4 MG SL tablet    Sig: DISSOLVE 1 TABLET UNDER THE TONGUE EVERY 5 MINUTES AS NEEDED FOR CHEST PAIN. DO NOT EXCEED A TOTAL OF 3 DOSES IN  15 MINUTES.    Dispense:  25 tablet    Refill:  2   DISCONTD: lisinopril (ZESTRIL) 10 MG tablet    Sig: Take 1 tablet (10 mg total) by mouth daily. TAKE ONE TABLET BY MOUTH EVERYDAY AT BEDTIME    Dispense:  90 tablet    Refill:  3    Dose change new Rx   hydrALAZINE (APRESOLINE) 25 MG tablet    Sig: Take 1 tablet (25 mg total) by mouth 3 (three) times daily.    Dispense:  180 tablet    Refill:  0   lisinopril (ZESTRIL) 10 MG tablet    Sig: Take 1 tablet (10 mg total) by mouth daily. TAKE ONE TABLET BY MOUTH EVERYDAY AT BEDTIME    Dispense:  60 tablet    Refill:  0    Dose change new Rx   nitroGLYCERIN (NITROSTAT) 0.4 MG SL tablet    Sig: DISSOLVE 1 TABLET UNDER THE TONGUE EVERY 5 MINUTES AS NEEDED FOR CHEST PAIN. DO NOT EXCEED A TOTAL OF 3 DOSES IN 15 MINUTES.    Dispense:  25 tablet    Refill:  2   isosorbide mononitrate (IMDUR) 60 MG 24 hr tablet    Sig: Take 1 tablet (60 mg total) by mouth daily.    Dispense:  30 tablet    Refill:  0    Patient Instructions  Medication Instructions:  DECREASE Lisinopril to 10 mg daily  INCREASE Imdur to 60 mg daily TAKE Hydralazine 25 mg 3 times a day   *If you need a refill on your cardiac medications before your next appointment, please call your pharmacy*  Lab Work: NONE ordered at this time of appointment   If you have labs (blood work) drawn today and your tests are completely normal, you will receive your results only by: [place] (if you have MyChart) OR A paper copy in the mail If you have any lab test that is abnormal or we need to change your treatment, we will call you to review the results.  Testing/Procedures: NONE ordered at this time of appointment   Follow-Up: At [facility], you and your health needs are  our priority.  As part of our continuing mission to provide you with exceptional heart care, we have created designated Provider Care Teams.  These Care Teams include your primary Cardiologist  (physician) and Advanced Practice Providers (APPs -  Physician Assistants and Nurse Practitioners) who all work together to provide you with the care you need, when you need it.  Your next appointment:   4-6 week(s)  Provider:   APP        Other Instructions Monitor blood pressure a home. Bring BP log to follow up appointment. Call our office if top number of BP is consistently less than 95    Signed, Margie Billet, PA-C  10/21/2022 11:02 AM    [place]

## 2022-10-21 ENCOUNTER — Telehealth (HOSPITAL_COMMUNITY): Payer: Self-pay | Admitting: *Deleted

## 2022-10-21 ENCOUNTER — Telehealth: Payer: Self-pay | Admitting: Cardiology

## 2022-10-21 ENCOUNTER — Ambulatory Visit: Payer: PPO | Attending: Physician Assistant | Admitting: Cardiology

## 2022-10-21 ENCOUNTER — Encounter: Payer: Self-pay | Admitting: Physician Assistant

## 2022-10-21 VITALS — BP 108/60 | HR 55 | Ht 65.0 in | Wt 170.0 lb

## 2022-10-21 DIAGNOSIS — I2510 Atherosclerotic heart disease of native coronary artery without angina pectoris: Secondary | ICD-10-CM | POA: Diagnosis not present

## 2022-10-21 DIAGNOSIS — Z9861 Coronary angioplasty status: Secondary | ICD-10-CM

## 2022-10-21 DIAGNOSIS — I10 Essential (primary) hypertension: Secondary | ICD-10-CM | POA: Diagnosis not present

## 2022-10-21 DIAGNOSIS — E785 Hyperlipidemia, unspecified: Secondary | ICD-10-CM | POA: Diagnosis not present

## 2022-10-21 MED ORDER — HYDRALAZINE HCL 25 MG PO TABS: 25.0000 mg | ORAL_TABLET | Freq: Three times a day (TID) | ORAL | 0 refills | Status: DC

## 2022-10-21 MED ORDER — NITROGLYCERIN 0.4 MG SL SUBL: SUBLINGUAL_TABLET | SUBLINGUAL | 2 refills | Status: DC

## 2022-10-21 MED ORDER — LISINOPRIL 10 MG PO TABS: 10.0000 mg | ORAL_TABLET | Freq: Every day | ORAL | 3 refills | Status: DC

## 2022-10-21 MED ORDER — HYDRALAZINE HCL 25 MG PO TABS: 25.0000 mg | ORAL_TABLET | Freq: Three times a day (TID) | ORAL | 3 refills | Status: DC

## 2022-10-21 MED ORDER — LISINOPRIL 10 MG PO TABS: 10.0000 mg | ORAL_TABLET | Freq: Every day | ORAL | 0 refills | Status: DC

## 2022-10-21 MED ORDER — ISOSORBIDE MONONITRATE ER 60 MG PO TB24: 60.0000 mg | ORAL_TABLET | Freq: Every day | ORAL | 0 refills | Status: DC

## 2022-10-21 NOTE — Telephone Encounter (Signed)
Caller wants to confirm patient is OK to start cardiac rehab exercise tomorrow (2/28).

## 2022-10-21 NOTE — Telephone Encounter (Signed)
Spoke with [facility], aware note in the chart that patient is okay for cardiac rehab.

## 2022-10-21 NOTE — Progress Notes (Signed)
Cardiac Individual Treatment Plan  Patient Details  Name: [name] MRN: [id number] Date of Birth: Jan 17, 1941 Referring Provider:   Flowsheet Row INTENSIVE CARDIAC REHAB ORIENT from 10/16/2022 in [facility]  Referring Provider Dr. Minus Breeding, MD       Initial Encounter Date:  [place] from 10/16/2022 in [facility]  Date 10/16/22       Visit Diagnosis: 09/03/22 S/P DES Ostial and Proximal LAD  Chest pain, unspecified type - Plan: EKG 12-Lead, EKG 12-Lead  Patient's Home Medications on Admission:  Current Outpatient Medications:    amLODipine (NORVASC) 10 MG tablet, TAKE ONE TABLET BY MOUTH EVERYDAY AT BEDTIME, Disp: 90 tablet, Rfl: 3   aspirin EC 81 MG tablet, Take 81 mg by mouth daily. Swallow whole., Disp: , Rfl:    atenolol (TENORMIN) 50 MG tablet, Take 50 mg by mouth 2 (two) times daily., Disp: , Rfl:    atorvastatin (LIPITOR) 40 MG tablet, Take 1 tablet (40 mg total) by mouth at bedtime., Disp: 90 tablet, Rfl: 3   clopidogrel (PLAVIX) 75 MG tablet, Take 1 tablet (75 mg total) by mouth daily with breakfast., Disp: 90 tablet, Rfl: 2   Coenzyme Q10 (CO Q 10 PO), Take 1 tablet by mouth daily., Disp: , Rfl:    ferrous sulfate 325 (65 FE) MG tablet, Take 1 tablet (325 mg total) by mouth daily with breakfast., Disp: 90 tablet, Rfl: 1   hydrALAZINE (APRESOLINE) 25 MG tablet, Take 1 tablet (25 mg total) by mouth 3 (three) times daily., Disp: 180 tablet, Rfl: 0   isosorbide mononitrate (IMDUR) 60 MG 24 hr tablet, Take 1 tablet (60 mg total) by mouth daily., Disp: 30 tablet, Rfl: 0   KRILL OIL PO, Take 100 mg by mouth daily. Mega Red, Disp: , Rfl:    lidocaine (HM LIDOCAINE PATCH) 4 %, Place 1 patch onto the skin as needed., Disp: , Rfl:    lisinopril (ZESTRIL) 10 MG tablet, Take 1 tablet (10 mg total) by mouth daily. TAKE  ONE TABLET BY MOUTH EVERYDAY AT BEDTIME, Disp: 60 tablet, Rfl: 0   Multiple Vitamins-Minerals (CENTRUM SILVER 50+MEN PO), Take 1 tablet by mouth., Disp: , Rfl:    Multiple Vitamins-Minerals (PRESERVISION AREDS 2) CAPS, Take 1 capsule by mouth 2 (two) times daily., Disp: , Rfl:    nitroGLYCERIN (NITROSTAT) 0.4 MG SL tablet, DISSOLVE 1 TABLET UNDER THE TONGUE EVERY 5 MINUTES AS NEEDED FOR CHEST PAIN. DO NOT EXCEED A TOTAL OF 3 DOSES IN 15 MINUTES., Disp: 25 tablet, Rfl: 2   pantoprazole (PROTONIX) 40 MG tablet, TAKE ONE TABLET BY MOUTH EVERY MORNING, Disp: 90 tablet, Rfl: 0   Polyethyl Glycol-Propyl Glycol (SYSTANE) 0.4-0.3 % SOLN, Place 2 drops into both eyes every morning., Disp: , Rfl:    primidone (MYSOLINE) 50 MG tablet, TAKE ONE TABLET BY MOUTH EVERY MORNING and TAKE ONE TABLET BY MOUTH EVERYDAY AT BEDTIME, Disp: 180 tablet, Rfl: 0  Past Medical History: Past Medical History:  Diagnosis Date   Anemia    Barrett's esophagus    Blood transfusion without reported diagnosis    CAD (coronary artery disease)    2002 stenting of the RCA,, 2006 Stenting of diag with DES, PTCA of Circ, 50% LAD   Cancer ([place])    skin cancer scalp and forehead   Chronic kidney disease    CKD III due  to HCTZ per pt - decreased dose of HCTZ to M,F   Diverticulosis    DJD (degenerative joint disease)    Duodenal ulcer    ED (erectile dysfunction)    GERD (gastroesophageal reflux disease)    increased s/s in the last week 03-22-19 PV    Hemorrhoids    Hyperlipidemia    Hypertension    Myocardial infarction ([place])    2002   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   mid frontal scalp-cx33f   SCC (squamous cell carcinoma) 06/15/2019   front middle scalp-tx cx360f  SCC (squamous cell carcinoma) 12/27/2002   CIS-right angle of jaw--[id number] SCC (squamous cell carcinoma) 09/24/2004   CIS--mid post crown, left mis crown medial,left first web space-tx [id number] (squamous cell carcinoma) 06/03/2006    SCCA-right temple-tx [id number] (squamous cell carcinoma) 06/03/2006   CIS-right forehead, left mis crown scalp medial-tx cx35fu 42fC (squamous cell carcinoma) 07/30/2006   right temple lateral-txpbx   SCC (squamous cell carcinoma) 11/10/2006   left forehead, left hand proximal   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/02/2009   left scalp-cx35fu  20f (squamous cell carcinoma) 01/24/2015   left scalp-cx35fu   90f(squamous cell carcinoma) 11/12/2015   front lateralscalp-cx35fu, me36f scalp-cx35fu   SC24fquamous cell carcinoma) 12/17/2016   front center scalp-cx35fu   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/10/2017   CIS--right ear rim, top of scalp, left side scalp-tx-cx35fu    Tob10f Use: Social History   Tobacco Use  Smoking Status Former   Types: Cigarettes   Quit date: 08/25/1961   Years since quitting: 61.1   Passive exposure: Never  Smokeless Tobacco Never    Labs: Review Flowsheet  More data exists      Latest Ref Rng & Units 03/08/2018 03/10/2019 03/12/2020 03/14/2021 12/26/2021  Labs for ITP Cardiac and Pulmonary Rehab  Cholestrol 0 - 200 mg/dL 139  126  124  130  123   LDL (calc) 0 - 99 mg/dL 59  64  60  55  51   HDL-C >39.00 mg/dL 50.40  42.40  44  48.70  48.40   Trlycerides 0.0 - 149.0 mg/dL 149.0  101.0  121  135.0  119.0     Capillary Blood Glucose: No results found for: "GLUCAP"   Exercise Target Goals: Exercise Program Goal: Individual exercise prescription set using results from initial 6 min walk test and THRR while considering  patient's activity barriers and safety.   Exercise Prescription Goal: Initial exercise prescription builds to 30-45 minutes a day of aerobic activity, 2-3 days per week.  Home exercise guidelines will be given to patient during program as part of exercise prescription that the participant will acknowledge.  Activity Barriers & Risk Stratification:  Activity Barriers & Cardiac Risk Stratification - 10/16/22 1438       Activity  Barriers & Cardiac Risk Stratification   Activity Barriers Arthritis;Deconditioning;Shortness of Breath;Joint Problems;Neck/Spine Problems;Back Problems;Balance Concerns    Cardiac Risk Stratification High   Under 5 Mets 6MWT            6 Minute Walk:  6 Minute Walk     Row Name 10/16/22 1435         6 Minute Walk   Phase Initial     Distance 1040 feet     Walk Time 6 minutes     # of Rest Breaks 0     MPH 1.97     METS 1.53  RPE 12     Perceived Dyspnea  1     VO2 Peak 5.35     Symptoms Yes (comment)     Comments Chest fullness at end of walk test-resolved with rest     Resting HR 55 bpm     Resting BP 132/62     Resting Oxygen Saturation  96 %     Exercise Oxygen Saturation  during 6 min walk 96 %     Max Ex. HR 87 bpm     Max Ex. BP 144/72     2 Minute Post BP 120/68              Oxygen Initial Assessment:   Oxygen Re-Evaluation:   Oxygen Discharge (Final Oxygen Re-Evaluation):   Initial Exercise Prescription:  Initial Exercise Prescription - 10/16/22 1400       Date of Initial Exercise RX and Referring Provider   Date 10/16/22    Referring Provider Dr. Minus Breeding, MD    Expected Discharge Date 12/26/22      Arm Ergometer   Level 1    RPM 40    Minutes 15    METs 1.4      Track   Laps 15    Minutes 15    METs 1.6      Prescription Details   Frequency (times per week) 3    Duration Progress to 30 minutes of continuous aerobic without signs/symptoms of physical distress      Intensity   THRR 40-80% of Max Heartrate 54-109    Ratings of Perceived Exertion 11-13    Perceived Dyspnea 0-4      Progression   Progression Continue progressive overload as per policy without signs/symptoms or physical distress.      Resistance Training   Training Prescription Yes    Weight 3    Reps 10-15             Perform Capillary Blood Glucose checks as needed.  Exercise Prescription Changes:   Exercise Comments:   Exercise  Goals and Review:   Exercise Goals     Row Name 10/16/22 1445             Exercise Goals   Increase Physical Activity Yes       Intervention Provide advice, education, support and counseling about physical activity/exercise needs.;Develop an individualized exercise prescription for aerobic and resistive training based on initial evaluation findings, risk stratification, comorbidities and participant's personal goals.       Expected Outcomes Short Term: Attend rehab on a regular basis to increase amount of physical activity.;Long Term: Exercising regularly at least 3-5 days a week.;Long Term: Add in home exercise to make exercise part of routine and to increase amount of physical activity.       Increase Strength and Stamina Yes       Intervention Provide advice, education, support and counseling about physical activity/exercise needs.;Develop an individualized exercise prescription for aerobic and resistive training based on initial evaluation findings, risk stratification, comorbidities and participant's personal goals.       Expected Outcomes Short Term: Increase workloads from initial exercise prescription for resistance, speed, and METs.;Short Term: Perform resistance training exercises routinely during rehab and add in resistance training at home;Long Term: Improve cardiorespiratory fitness, muscular endurance and strength as measured by increased METs and functional capacity (6MWT)       Able to understand and use rate of perceived exertion (RPE) scale Yes  Intervention Provide education and explanation on how to use RPE scale       Expected Outcomes Short Term: Able to use RPE daily in rehab to express subjective intensity level;Long Term:  Able to use RPE to guide intensity level when exercising independently       Able to understand and use Dyspnea scale Yes       Intervention Provide education and explanation on how to use Dyspnea scale       Expected Outcomes Short Term: Able to  use Dyspnea scale daily in rehab to express subjective sense of shortness of breath during exertion;Long Term: Able to use Dyspnea scale to guide intensity level when exercising independently       Knowledge and understanding of Target Heart Rate Range (THRR) Yes       Intervention Provide education and explanation of THRR including how the numbers were predicted and where they are located for reference       Expected Outcomes Short Term: Able to state/look up THRR;Long Term: Able to use THRR to govern intensity when exercising independently;Short Term: Able to use daily as guideline for intensity in rehab       Understanding of Exercise Prescription Yes       Intervention Provide education, explanation, and written materials on patient's individual exercise prescription       Expected Outcomes Short Term: Able to explain program exercise prescription;Long Term: Able to explain home exercise prescription to exercise independently                Exercise Goals Re-Evaluation :   Discharge Exercise Prescription (Final Exercise Prescription Changes):   Nutrition:  Target Goals: Understanding of nutrition guidelines, daily intake of sodium '1500mg'$ , cholesterol '200mg'$ , calories 30% from fat and 7% or less from saturated fats, daily to have 5 or more servings of fruits and vegetables.  Biometrics:  Pre Biometrics - 10/16/22 1124       Pre Biometrics   Waist Circumference 40 inches    Hip Circumference 39 inches    Waist to Hip Ratio 1.03 %    Triceps Skinfold 16 mm    % Body Fat 29.2 %    Grip Strength 35 kg    Flexibility 5 in    Single Leg Stand 12 seconds              Nutrition Therapy Plan and Nutrition Goals:   Nutrition Assessments:  MEDIFICTS Score Key: ?70 Need to make dietary changes  40-70 Heart Healthy Diet ? 40 Therapeutic Level Cholesterol Diet    Picture Your Plate Scores: [id number] Unhealthy dietary pattern with much room for improvement. 41-50 Dietary  pattern unlikely to meet recommendations for good health and room for improvement. 51-60 More healthful dietary pattern, with some room for improvement.  >60 Healthy dietary pattern, although there may be some specific behaviors that could be improved.    Nutrition Goals Re-Evaluation:   Nutrition Goals Re-Evaluation:   Nutrition Goals Discharge (Final Nutrition Goals Re-Evaluation):   Psychosocial: Target Goals: Acknowledge presence or absence of significant depression and/or stress, maximize coping skills, provide positive support system. Participant is able to verbalize types and ability to use techniques and skills needed for reducing stress and depression.  Initial Review & Psychosocial Screening:  Initial Psych Review & Screening - 10/16/22 1312       Initial Review   Current issues with None Identified      Family Dynamics   Good Support System? Yes  Joe has his wife for support     Barriers   Psychosocial barriers to participate in program There are no identifiable barriers or psychosocial needs.      Screening Interventions   Interventions Encouraged to exercise             Quality of Life Scores:  Quality of Life - 10/16/22 1447       Quality of Life   Select Quality of Life      Quality of Life Scores   Health/Function Pre 28.47 %    Socioeconomic Pre 29.58 %    Psych/Spiritual Pre 30 %    Family Pre 28.8 %    GLOBAL Pre 29.05 %            Scores of 19 and below usually indicate a poorer quality of life in these areas.  A difference of  2-3 points is a clinically meaningful difference.  A difference of 2-3 points in the total score of the Quality of Life Index has been associated with significant improvement in overall quality of life, self-image, physical symptoms, and general health in studies assessing change in quality of life.  PHQ-9: Review Flowsheet  More data exists      10/16/2022 03/21/2022 03/20/2021 03/14/2021 10/04/2020  Depression  screen PHQ 2/9  Decreased Interest 0 0 0 0 0 0  Down, Depressed, Hopeless 0 0 0 0 0 0  PHQ - 2 Score 0 0 0 0 0 0  Altered sleeping 0 0 - - - -  Tired, decreased energy 0 0 - - - -  Change in appetite 0 0 - - - -  Feeling bad or failure about yourself  0 0 - - - -  Trouble concentrating 0 0 - - - -  Moving slowly or fidgety/restless 0 0 - - - -  Suicidal thoughts 0 0 - - - -  PHQ-9 Score 0 0 - - - -  Difficult doing work/chores Not difficult at all - - - -   Interpretation of Total Score  Total Score Depression Severity:  1-4 = Minimal depression, 5-9 = Mild depression, 10-14 = Moderate depression, 15-19 = Moderately severe depression, 20-27 = Severe depression   Psychosocial Evaluation and Intervention:   Psychosocial Re-Evaluation:   Psychosocial Discharge (Final Psychosocial Re-Evaluation):   Vocational Rehabilitation: Provide vocational rehab assistance to qualifying candidates.   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 Joe does volunteer work            Education: Education Goals: Education classes will be provided on a weekly basis, covering required topics. Participant will state understanding/return demonstration of topics presented.     Core Videos: Exercise    Move It!  Clinical staff conducted group or individual video education with verbal and written material and guidebook.  Patient learns the recommended Pritikin exercise program. Exercise with the goal of living a long, healthy life. Some of the health benefits of exercise include controlled diabetes, healthier blood pressure levels, improved cholesterol levels, improved heart and lung capacity, improved sleep, and better body composition. Everyone should speak with their doctor  before starting or changing an exercise routine.  Biomechanical Limitations Clinical staff conducted group or individual video education with verbal and written material  and guidebook.  Patient learns how biomechanical limitations can impact exercise and how we can mitigate and possibly overcome limitations to have an impactful and balanced exercise routine.  Body Composition Clinical staff conducted group or individual video education with verbal and written material and guidebook.  Patient learns that body composition (ratio of muscle mass to fat mass) is a key component to assessing overall fitness, rather than body weight alone. Increased fat mass, especially visceral belly fat, can put [place] at increased risk for metabolic syndrome, type 2 diabetes, heart disease, and even death. It is recommended to combine diet and exercise (cardiovascular and resistance training) to improve your body composition. Seek guidance from your physician and exercise physiologist before implementing an exercise routine.  Exercise Action Plan Clinical staff conducted group or individual video education with verbal and written material and guidebook.  Patient learns the recommended strategies to achieve and enjoy long-term exercise adherence, including variety, self-motivation, self-efficacy, and positive decision making. Benefits of exercise include fitness, good health, weight management, more energy, better sleep, less stress, and overall well-being.  Medical   Heart Disease Risk Reduction Clinical staff conducted group or individual video education with verbal and written material and guidebook.  Patient learns our heart is our most vital organ as it circulates oxygen, nutrients, white blood cells, and hormones throughout the entire body, and carries waste away. Data supports a plant-based eating plan like the Pritikin Program for its effectiveness in slowing progression of and reversing heart disease. The video  provides a number of recommendations to address heart disease.   Metabolic Syndrome and Belly Fat  Clinical staff conducted group or individual video education with verbal and written material and guidebook.  Patient learns what metabolic syndrome is, how it leads to heart disease, and how one can reverse it and keep it from coming back. You have metabolic syndrome if you have 3 of the following 5 criteria: abdominal obesity, high blood pressure, high triglycerides, low HDL cholesterol, and high blood sugar.  Hypertension and Heart Disease Clinical staff conducted group or individual video education with verbal and written material and guidebook.  Patient learns that high blood pressure, or hypertension, is very common in the [place]. Hypertension is largely due to excessive salt intake, but other important risk factors include being overweight, physical inactivity, drinking too much alcohol, smoking, and not eating enough potassium from fruits and vegetables. High blood pressure is a leading risk factor for heart attack, stroke, congestive heart failure, dementia, kidney failure, and premature death. Long-term effects of excessive salt intake include stiffening of the arteries and thickening of heart muscle and organ damage. Recommendations include ways to reduce hypertension and the risk of heart disease.  Diseases of Our Time - Focusing on Diabetes Clinical staff conducted group or individual video education with verbal and written material and guidebook.  Patient learns why the best way to stop diseases of our time is prevention, through food and other lifestyle changes. Medicine (such as prescription pills and surgeries) is often only a Band-Aid on the problem, not a long-term solution. Most common diseases of our time include obesity, type 2 diabetes, hypertension, heart disease, and cancer. The Pritikin Program is recommended and has been proven to help reduce, reverse, and/or prevent the  damaging effects of metabolic syndrome.  Nutrition   Overview of the Pritikin Eating Plan  Clinical staff conducted group or individual video education with verbal and written material and guidebook.  Patient learns about the [place] for  disease risk reduction. The [place] emphasizes a wide variety of unrefined, minimally-processed carbohydrates, like fruits, vegetables, whole grains, and legumes. Go, Caution, and Stop food choices are explained. Plant-based and lean animal proteins are emphasized. Rationale provided for low sodium intake for blood pressure control, low added sugars for blood sugar stabilization, and low added fats and oils for coronary artery disease risk reduction and weight management.  Calorie Density  Clinical staff conducted group or individual video education with verbal and written material and guidebook.  Patient learns about calorie density and how it impacts the Pritikin Eating Plan. Knowing the characteristics of the food you choose will help you decide whether those foods will lead to weight gain or weight loss, and whether you want to consume more or less of them. Weight loss is usually a side effect of the Pritikin Eating Plan because of its focus on low calorie-dense foods.  Label Reading  Clinical staff conducted group or individual video education with verbal and written material and guidebook.  Patient learns about the Pritikin recommended label reading guidelines and corresponding recommendations regarding calorie density, added sugars, sodium content, and whole grains.  Dining Out - Part 1  Clinical staff conducted group or individual video education with verbal and written material and guidebook.  Patient learns that restaurant meals can be sabotaging because they can be so high in calories, fat, sodium, and/or sugar. Patient learns recommended strategies on how to positively address this and avoid unhealthy pitfalls.  Facts on Fats   Clinical staff conducted group or individual video education with verbal and written material and guidebook.  Patient learns that lifestyle modifications can be just as effective, if not more so, as many medications for lowering your risk of heart disease. A Pritikin lifestyle can help to reduce your risk of inflammation and atherosclerosis (cholesterol build-up, or plaque, in the artery walls). Lifestyle interventions such as dietary choices and physical activity address the cause of atherosclerosis. A review of the types of fats and their impact on blood cholesterol levels, along with dietary recommendations to reduce fat intake is also included.  Nutrition Action Plan  Clinical staff conducted group or individual video education with verbal and written material and guidebook.  Patient learns how to incorporate Pritikin recommendations into their lifestyle. Recommendations include planning and keeping personal health goals in mind as an important part of their success.  Healthy Mind-Set    Healthy Minds, Bodies, Hearts  Clinical staff conducted group or individual video education with verbal and written material and guidebook.  Patient learns how to identify when they are stressed. Video will discuss the impact of that stress, as well as the many benefits of stress management. Patient will also be introduced to stress management techniques. The way we think, act, and feel has an impact on our hearts.  How Our Thoughts Can Heal Our Hearts  Clinical staff conducted group or individual video education with verbal and written material and guidebook.  Patient learns that negative thoughts can cause depression and anxiety. This can result in negative lifestyle behavior and serious health problems. Cognitive behavioral therapy is an effective method to help control our thoughts in order to change and improve our emotional outlook.  Additional Videos:  Exercise    Improving Performance  Clinical  staff conducted group or individual video education with verbal and written material and guidebook.  Patient learns to use a non-linear approach by alternating intensity levels and lengths of time spent exercising to help burn more  calories and lose more body fat. Cardiovascular exercise helps improve heart health, metabolism, hormonal balance, blood sugar control, and recovery from fatigue. Resistance training improves strength, endurance, balance, coordination, reaction time, metabolism, and muscle mass. Flexibility exercise improves circulation, posture, and balance. Seek guidance from your physician and exercise physiologist before implementing an exercise routine and learn your capabilities and proper form for all exercise.  Introduction to Yoga  Clinical staff conducted group or individual video education with verbal and written material and guidebook.  Patient learns about yoga, a discipline of the coming together of mind, breath, and body. The benefits of yoga include improved flexibility, improved range of motion, better posture and core strength, increased lung function, weight loss, and positive self-image. Yoga's heart health benefits include lowered blood pressure, healthier heart rate, decreased cholesterol and triglyceride levels, improved immune function, and reduced stress. Seek guidance from your physician and exercise physiologist before implementing an exercise routine and learn your capabilities and proper form for all exercise.  Medical   Aging: Enhancing Your Quality of Life  Clinical staff conducted group or individual video education with verbal and written material and guidebook.  Patient learns key strategies and recommendations to stay in good physical health and enhance quality of life, such as prevention strategies, having an advocate, securing a [place], and keeping a list of medications and system for tracking them. It also discusses how to  avoid risk for bone loss.  Biology of Weight Control  Clinical staff conducted group or individual video education with verbal and written material and guidebook.  Patient learns that weight gain occurs because we consume more calories than we burn (eating more, moving less). Even if your body weight is normal, you may have higher ratios of fat compared to muscle mass. Too much body fat puts you at increased risk for cardiovascular disease, heart attack, stroke, type 2 diabetes, and obesity-related cancers. In addition to exercise, following the [place] can help reduce your risk.  Decoding Lab Results  Clinical staff conducted group or individual video education with verbal and written material and guidebook.  Patient learns that lab test reflects one measurement whose values change over time and are influenced by many factors, including medication, stress, sleep, exercise, food, hydration, pre-existing medical conditions, and more. It is recommended to use the knowledge from this video to become more involved with your lab results and evaluate your numbers to speak with your doctor.   Diseases of Our Time - Overview  Clinical staff conducted group or individual video education with verbal and written material and guidebook.  Patient learns that according to the CDC, 50% to 70% of chronic diseases (such as obesity, type 2 diabetes, elevated lipids, hypertension, and heart disease) are avoidable through lifestyle improvements including healthier food choices, listening to satiety cues, and increased physical activity.  Sleep Disorders Clinical staff conducted group or individual video education with verbal and written material and guidebook.  Patient learns how good quality and duration of sleep are important to overall health and well-being. Patient also learns about sleep disorders and how they impact health along with recommendations to address them, including discussing with a  physician.  Nutrition  Dining Out - Part 2 Clinical staff conducted group or individual video education with verbal and written material and guidebook.  Patient learns how to plan ahead and communicate in order to maximize their dining experience in a healthy and nutritious manner. Included are recommended food choices based  on the type of restaurant the patient is visiting.   Fueling a Best boy conducted group or individual video education with verbal and written material and guidebook.  There is a strong connection between our food choices and our health. Diseases like obesity and type 2 diabetes are very prevalent and are in large-part due to lifestyle choices. The Pritikin Eating Plan provides plenty of food and hunger-curbing satisfaction. It is easy to follow, affordable, and helps reduce health risks.  Menu Workshop  Clinical staff conducted group or individual video education with verbal and written material and guidebook.  Patient learns that restaurant meals can sabotage health goals because they are often packed with calories, fat, sodium, and sugar. Recommendations include strategies to plan ahead and to communicate with the manager, chef, or server to help order a healthier meal.  Planning Your Eating Strategy  Clinical staff conducted group or individual video education with verbal and written material and guidebook.  Patient learns about the [place] and its benefit of reducing the risk of disease. The [place] does not focus on calories. Instead, it emphasizes high-quality, nutrient-rich foods. By knowing the characteristics of the foods, we choose, we can determine their calorie density and make informed decisions.  Targeting Your Nutrition Priorities  Clinical staff conducted group or individual video education with verbal and written material and guidebook.  Patient learns that lifestyle habits have a tremendous impact on disease  risk and progression. This video provides eating and physical activity recommendations based on your personal health goals, such as reducing LDL cholesterol, losing weight, preventing or controlling type 2 diabetes, and reducing high blood pressure.  Vitamins and Minerals  Clinical staff conducted group or individual video education with verbal and written material and guidebook.  Patient learns different ways to obtain key vitamins and minerals, including through a recommended healthy diet. It is important to discuss all supplements you take with your doctor.   Healthy Mind-Set    Smoking Cessation  Clinical staff conducted group or individual video education with verbal and written material and guidebook.  Patient learns that cigarette smoking and tobacco addiction pose a serious health risk which affects millions of people. Stopping smoking will significantly reduce the risk of heart disease, lung disease, and many forms of cancer. Recommended strategies for quitting are covered, including working with your doctor to develop a successful plan.  Culinary   Becoming a Financial trader conducted group or individual video education with verbal and written material and guidebook.  Patient learns that cooking at home can be healthy, cost-effective, quick, and puts them in control. Keys to cooking healthy recipes will include looking at your recipe, assessing your equipment needs, planning ahead, making it simple, choosing cost-effective seasonal ingredients, and limiting the use of added fats, salts, and sugars.  Cooking - Breakfast and Snacks  Clinical staff conducted group or individual video education with verbal and written material and guidebook.  Patient learns how important breakfast is to satiety and nutrition through the entire day. Recommendations include key foods to eat during breakfast to help stabilize blood sugar levels and to prevent overeating at meals later in the day.  Planning ahead is also a key component.  Cooking - Human resources officer conducted group or individual video education with verbal and written material and guidebook.  Patient learns eating strategies to improve overall health, including an approach to cook more at home. Recommendations include thinking of animal  protein as a side on your plate rather than center stage and focusing instead on lower calorie dense options like vegetables, fruits, whole grains, and plant-based proteins, such as beans. Making sauces in large quantities to freeze for later and leaving the skin on your vegetables are also recommended to maximize your experience.  Cooking - Healthy Salads and Dressing Clinical staff conducted group or individual video education with verbal and written material and guidebook.  Patient learns that vegetables, fruits, whole grains, and legumes are the foundations of the [place]. Recommendations include how to incorporate each of these in flavorful and healthy salads, and how to create homemade salad dressings. Proper handling of ingredients is also covered. Cooking - Soups and [employer] - Soups and Desserts Clinical staff conducted group or individual video education with verbal and written material and guidebook.  Patient learns that Pritikin soups and desserts make for easy, nutritious, and delicious snacks and meal components that are low in sodium, fat, sugar, and calorie density, while high in vitamins, minerals, and filling fiber. Recommendations include simple and healthy ideas for soups and desserts.   Overview     The Pritikin Solution Program Overview Clinical staff conducted group or individual video education with verbal and written material and guidebook.  Patient learns that the results of the [place] Program have been documented in more than 100 articles published in peer-reviewed journals, and the benefits include reducing risk factors for  (and, in some cases, even reversing) high cholesterol, high blood pressure, type 2 diabetes, obesity, and more! An overview of the three key pillars of the Pritikin Program will be covered: eating well, doing regular exercise, and having a healthy mind-set.  WORKSHOPS  Exercise: Exercise Basics: Building Your Action Plan Clinical staff led group instruction and group discussion with PowerPoint presentation and patient guidebook. To enhance the learning environment the use of posters, models and videos may be added. At the conclusion of this workshop, patients will comprehend the difference between physical activity and exercise, as well as the benefits of incorporating both, into their routine. Patients will understand the FITT (Frequency, Intensity, Time, and Type) principle and how to use it to build an exercise action plan. In addition, safety concerns and other considerations for exercise and cardiac rehab will be addressed by the presenter. The purpose of this lesson is to promote a comprehensive and effective weekly exercise routine in order to improve patients' overall level of fitness.   Managing Heart Disease: Your Path to a Healthier Heart Clinical staff led group instruction and group discussion with PowerPoint presentation and patient guidebook. To enhance the learning environment the use of posters, models and videos may be added.At the conclusion of this workshop, patients will understand the anatomy and physiology of the heart. Additionally, they will understand how Pritikin's three pillars impact the risk factors, the progression, and the management of heart disease.  The purpose of this lesson is to provide a high-level overview of the heart, heart disease, and how the Pritikin lifestyle positively impacts risk factors.  Exercise Biomechanics Clinical staff led group instruction and group discussion with PowerPoint presentation and patient guidebook. To enhance the learning  environment the use of posters, models and videos may be added. Patients will learn how the structural parts of their bodies function and how these functions impact their daily activities, movement, and exercise. Patients will learn how to promote a neutral spine, learn how to manage pain, and identify ways to improve their physical movement in  order to promote healthy living. The purpose of this lesson is to expose patients to common physical limitations that impact physical activity. Participants will learn practical ways to adapt and manage aches and pains, and to minimize their effect on regular exercise. Patients will learn how to maintain good posture while sitting, walking, and lifting.  Balance Training and Fall Prevention  Clinical staff led group instruction and group discussion with PowerPoint presentation and patient guidebook. To enhance the learning environment the use of posters, models and videos may be added. At the conclusion of this workshop, patients will understand the importance of their sensorimotor skills (vision, proprioception, and the vestibular system) in maintaining their ability to balance as they age. Patients will apply a variety of balancing exercises that are appropriate for their current level of function. Patients will understand the common causes for poor balance, possible solutions to these problems, and ways to modify their physical environment in order to minimize their fall risk. The purpose of this lesson is to teach patients about the importance of maintaining balance as they age and ways to minimize their risk of falling.  WORKSHOPS   Nutrition:  Fueling a Scientist, research (physical sciences) led group instruction and group discussion with PowerPoint presentation and patient guidebook. To enhance the learning environment the use of posters, models and videos may be added. Patients will review the foundational principles of the [place] and understand  what constitutes a serving size in each of the food groups. Patients will also learn Pritikin-friendly foods that are better choices when away from home and review make-ahead meal and snack options. Calorie density will be reviewed and applied to three nutrition priorities: weight maintenance, weight loss, and weight gain. The purpose of this lesson is to reinforce (in a group setting) the key concepts around what patients are recommended to eat and how to apply these guidelines when away from home by planning and selecting Pritikin-friendly options. Patients will understand how calorie density may be adjusted for different weight management goals.  Mindful Eating  Clinical staff led group instruction and group discussion with PowerPoint presentation and patient guidebook. To enhance the learning environment the use of posters, models and videos may be added. Patients will briefly review the concepts of the [place] and the importance of low-calorie dense foods. The concept of mindful eating will be introduced as well as the importance of paying attention to internal hunger signals. Triggers for non-hunger eating and techniques for dealing with triggers will be explored. The purpose of this lesson is to provide patients with the opportunity to review the basic principles of the [place], discuss the value of eating mindfully and how to measure internal cues of hunger and fullness using the Hunger Scale. Patients will also discuss reasons for non-hunger eating and learn strategies to use for controlling emotional eating.  Targeting Your Nutrition Priorities Clinical staff led group instruction and group discussion with PowerPoint presentation and patient guidebook. To enhance the learning environment the use of posters, models and videos may be added. Patients will learn how to determine their genetic susceptibility to disease by reviewing their family history. Patients will gain  insight into the importance of diet as part of an overall healthy lifestyle in mitigating the impact of genetics and other environmental insults. The purpose of this lesson is to provide patients with the opportunity to assess their personal nutrition priorities by looking at their family history, their own health history and current risk factors. Patients  will also be able to discuss ways of prioritizing and modifying the [place] for their highest risk areas  Menu  Clinical staff led group instruction and group discussion with PowerPoint presentation and patient guidebook. To enhance the learning environment the use of posters, models and videos may be added. Using menus brought in from [employer], or printed from [employer], patients will apply the [place] dining out guidelines that were presented in the [employer] video. Patients will also be able to practice these guidelines in a variety of provided scenarios. The purpose of this lesson is to provide patients with the opportunity to practice hands-on learning of the [place] with actual menus and practice scenarios.  Label Reading Clinical staff led group instruction and group discussion with PowerPoint presentation and patient guidebook. To enhance the learning environment the use of posters, models and videos may be added. Patients will review and discuss the Pritikin label reading guidelines presented in Pritikin's Label Reading Educational series video. Using fool labels brought in from local grocery stores and markets, patients will apply the label reading guidelines and determine if the packaged food meet the Pritikin guidelines. The purpose of this lesson is to provide patients with the opportunity to review, discuss, and practice hands-on learning of the Pritikin Label Reading guidelines with actual packaged food labels. [place] Workshops are  designed to teach patients ways to prepare quick, simple, and affordable recipes at home. The importance of nutrition's role in chronic disease risk reduction is reflected in its emphasis in the overall Pritikin program. By learning how to prepare essential core Pritikin Eating Plan recipes, patients will increase control over what they eat; be able to customize the flavor of foods without the use of added salt, sugar, or fat; and improve the quality of the food they consume. By learning a set of core recipes which are easily assembled, quickly prepared, and affordable, patients are more likely to prepare more healthy foods at home. These workshops focus on convenient breakfasts, simple entres, side dishes, and desserts which can be prepared with minimal effort and are consistent with nutrition recommendations for cardiovascular risk reduction. Cooking [employer] are taught by a Engineer, materials (RD) who has been trained by the [employer]. The chef or RD has a clear understanding of the importance of minimizing - if not completely eliminating - added fat, sugar, and sodium in recipes. Throughout the series of [place] Workshop sessions, patients will learn about healthy ingredients and efficient methods of cooking to build confidence in their capability to prepare    Cooking School weekly topics:  Adding Flavor- Sodium-Free  Fast and Healthy Breakfasts  Powerhouse Plant-Based Proteins  Satisfying Salads and Dressings  Simple Sides and Sauces  International Cuisine-Spotlight on the [place] Zones  Delicious Desserts  Savory Soups  Efficiency Cooking - Meals in a Snap  Tasty Appetizers and Snacks  Comforting Weekend Breakfasts  One-Pot Wonders   Fast Evening Meals  Easy [place] (Psychosocial): New Thoughts, New Behaviors Clinical staff led group instruction and group discussion with  PowerPoint presentation and patient guidebook. To enhance the learning environment the use of posters, models and videos may be added. Patients will learn and practice techniques for developing effective health and lifestyle goals. Patients will be able to effectively apply the goal setting process learned to develop at least one new personal  goal.  The purpose of this lesson is to expose patients to a new skill set of behavior modification techniques such as techniques setting SMART goals, overcoming barriers, and achieving new thoughts and new behaviors.  Managing Moods and Relationships Clinical staff led group instruction and group discussion with PowerPoint presentation and patient guidebook. To enhance the learning environment the use of posters, models and videos may be added. Patients will learn how emotional and chronic stress factors can impact their health and relationships. They will learn healthy ways to manage their moods and utilize positive coping mechanisms. In addition, ICR patients will learn ways to improve communication skills. The purpose of this lesson is to expose patients to ways of understanding how one's mood and health are intimately connected. Developing a healthy outlook can help build positive relationships and connections with others. Patients will understand the importance of utilizing effective communication skills that include actively listening and being heard. They will learn and understand the importance of the "4 Cs" and especially Connections in fostering of a Healthy Mind-Set.  Healthy Sleep for a Healthy Heart Clinical staff led group instruction and group discussion with PowerPoint presentation and patient guidebook. To enhance the learning environment the use of posters, models and videos may be added. At the conclusion of this workshop, patients will be able to demonstrate knowledge of the importance of sleep to overall health, well-being, and quality of life. They  will understand the symptoms of, and treatments for, common sleep disorders. Patients will also be able to identify daytime and nighttime behaviors which impact sleep, and they will be able to apply these tools to help manage sleep-related challenges. The purpose of this lesson is to provide patients with a general overview of sleep and outline the importance of quality sleep. Patients will learn about a few of the most common sleep disorders. Patients will also be introduced to the concept of "sleep hygiene," and discover ways to self-manage certain sleeping problems through simple daily behavior changes. Finally, the workshop will motivate patients by clarifying the links between quality sleep and their goals of heart-healthy living.   Recognizing and Reducing Stress Clinical staff led group instruction and group discussion with PowerPoint presentation and patient guidebook. To enhance the learning environment the use of posters, models and videos may be added. At the conclusion of this workshop, patients will be able to understand the types of stress reactions, differentiate between acute and chronic stress, and recognize the impact that chronic stress has on their health. They will also be able to apply different coping mechanisms, such as reframing negative self-talk. Patients will have the opportunity to practice a variety of stress management techniques, such as deep abdominal breathing, progressive muscle relaxation, and/or guided imagery.  The purpose of this lesson is to educate patients on the role of stress in their lives and to provide healthy techniques for coping with it.  Learning Barriers/Preferences:  Learning Barriers/Preferences - 10/16/22 1448       Learning Barriers/Preferences   Learning Barriers Sight;Exercise Concerns   balance, 12 seconds single leg stand, unsteady gait   Learning Preferences Pictoral;Video;Written Material;Skilled Demonstration;Individual Instruction;Group  Instruction             Education Topics:  Knowledge Questionnaire Score:  Knowledge Questionnaire Score - 10/16/22 1450       Knowledge Questionnaire Score   Pre Score 21/24             Core Components/Risk Factors/Patient Goals at Admission:  Personal Goals and Risk  Factors at Admission - 10/16/22 1451       Core Components/Risk Factors/Patient Goals on Admission    Weight Management Yes    Intervention Weight Management: Develop a combined nutrition and exercise program designed to reach desired caloric intake, while maintaining appropriate intake of nutrient and fiber, sodium and fats, and appropriate energy expenditure required for the weight goal.;Weight Management: Provide education and appropriate resources to help participant work on and attain dietary goals.    Expected Outcomes Short Term: Continue to assess and modify interventions until short term weight is achieved;Long Term: Adherence to nutrition and physical activity/exercise program aimed toward attainment of established weight goal    Improve shortness of breath with ADL's Yes    Intervention Provide education, individualized exercise plan and daily activity instruction to help decrease symptoms of SOB with activities of daily living.    Expected Outcomes Short Term: Improve cardiorespiratory fitness to achieve a reduction of symptoms when performing ADLs;Long Term: Be able to perform more ADLs without symptoms or delay the onset of symptoms    Hypertension Yes    Intervention Provide education on lifestyle modifcations including regular physical activity/exercise, weight management, moderate sodium restriction and increased consumption of fresh fruit, vegetables, and low fat dairy, alcohol moderation, and smoking cessation.;Monitor prescription use compliance.    Expected Outcomes Short Term: Continued assessment and intervention until BP is < 140/69m HG in hypertensive participants. < 130/81mHG in  hypertensive participants with diabetes, heart failure or chronic kidney disease.;Long Term: Maintenance of blood pressure at goal levels.    Lipids Yes    Intervention Provide education and support for participant on nutrition & aerobic/resistive exercise along with prescribed medications to achieve LDL '70mg'$ , HDL >'40mg'$ .    Expected Outcomes Short Term: Participant states understanding of desired cholesterol values and is compliant with medications prescribed. Participant is following exercise prescription and nutrition guidelines.;Long Term: Cholesterol controlled with medications as prescribed, with individualized exercise RX and with personalized nutrition plan. Value goals: LDL < '70mg'$ , HDL > 40 mg.    Personal Goal Other Yes    Personal Goal Short Term: Understand limits to exercise. Long term: Strength, Stamina    Intervention Will continue to monitor pt and progress WL as tolerated    Expected Outcomes Pt will acheive goals             Core Components/Risk Factors/Patient Goals Review:    Core Components/Risk Factors/Patient Goals at Discharge (Final Review):    ITP Comments:  ITP Comments     Row Name 10/16/22 0931 10/21/22 1151         ITP Comments Dr. TrFransico Himedical director. Introduction to pritikin education program/intensive cardiac rehab. Initial orientation packet reviewed with patient. 30 Day ITP Review. Joe is scheduled to begin cardiac rehab on 10/22/22               Comments: See ITP comments.MaHarrell GaveN BSN

## 2022-10-21 NOTE — Patient Instructions (Signed)
Medication Instructions:  DECREASE Lisinopril to 10 mg daily  INCREASE Imdur to 60 mg daily TAKE Hydralazine 25 mg 3 times a day   *If you need a refill on your cardiac medications before your next appointment, please call your pharmacy*  Lab Work: NONE ordered at this time of appointment   If you have labs (blood work) drawn today and your tests are completely normal, you will receive your results only by: [place] (if you have MyChart) OR A paper copy in the mail If you have any lab test that is abnormal or we need to change your treatment, we will call you to review the results.  Testing/Procedures: NONE ordered at this time of appointment   Follow-Up: At [facility], you and your health needs are our priority.  As part of our continuing mission to provide you with exceptional heart care, we have created designated Provider Care Teams.  These Care Teams include your primary Cardiologist (physician) and Advanced Practice Providers (APPs -  Physician Assistants and Nurse Practitioners) who all work together to provide you with the care you need, when you need it.  Your next appointment:   4-6 week(s)  Provider:   APP        Other Instructions Monitor blood pressure a home. Bring BP log to follow up appointment. Call our office if top number of BP is consistently less than 95

## 2022-10-21 NOTE — Telephone Encounter (Signed)
Patient was seen yesterday, will forward to [facility] pa-c to make sure okay for rehab.

## 2022-10-21 NOTE — Telephone Encounter (Signed)
Left message with [name] to let him know he has been cleared to start exercise at cardiac rehab.Harrell Gave RN BSN

## 2022-10-22 ENCOUNTER — Encounter (HOSPITAL_COMMUNITY)
Admission: RE | Admit: 2022-10-22 | Discharge: 2022-10-22 | Disposition: A | Discharge status: Home / Self Care | Payer: PPO | Source: Ambulatory Visit | Attending: Cardiology | Admitting: Cardiology

## 2022-10-22 DIAGNOSIS — Z955 Presence of coronary angioplasty implant and graft: Secondary | ICD-10-CM

## 2022-10-22 DIAGNOSIS — R079 Chest pain, unspecified: Secondary | ICD-10-CM

## 2022-10-22 NOTE — Progress Notes (Signed)
Daily Session Note  Patient Details  Name: [name] MRN: [id number] Date of Birth: 12/14/1940 Referring Provider:   Flowsheet Row INTENSIVE CARDIAC REHAB ORIENT from 10/16/2022 in [facility]  Referring Provider Dr. Minus Breeding, MD       Encounter Date: 10/22/2022  Check In:  Session Check In - 10/22/22 1521       Check-In   Supervising physician immediately available to respond to emergencies [facility] - Physician supervision    Physician(s) Fabian Sharp, PA    Location [facility]    Staff Present Esmeralda Links BS, ACSM-CEP, Exercise Physiologist;Adiba Fargnoli, RN, Milus Glazier, MS, ACSM-CEP, CCRP, Exercise Physiologist;Bailey Pearline Cables, MS, Exercise Physiologist;Johnny Starleen Blue, MS, Exercise Physiologist;Olinty Celesta Aver, MS, ACSM-CEP, Exercise Physiologist    Virtual Visit No    Medication changes reported     Yes    Comments several    Fall or balance concerns reported    No    Tobacco Cessation No Change    Warm-up and Cool-down Performed as group-led instruction    Resistance Training Performed No    VAD Patient? No    PAD/SET Patient? No      Pain Assessment   Currently in Pain? No/denies    Pain Score 0-No pain    Multiple Pain Sites No             Capillary Blood Glucose: No results found for this or any previous visit (from the past 24 hour(s)).   Exercise Prescription Changes - 10/22/22 1640       Response to Exercise   Blood Pressure (Admit) 122/72    Blood Pressure (Exercise) 132/70    Blood Pressure (Exit) 99/61    Heart Rate (Admit) 53 bpm    Heart Rate (Exercise) 78 bpm    Heart Rate (Exit) 57 bpm    Rating of Perceived Exertion (Exercise) 12    Perceived Dyspnea (Exercise) 0    Symptoms none    Comments Pt first day in teh Prit[facility] ICR program    Duration Progress to 30 minutes of  aerobic without signs/symptoms of physical distress    Intensity THRR unchanged       Progression   Progression Continue to progress workloads to maintain intensity without signs/symptoms of physical distress.    Average METs 2.62      Resistance Training   Training Prescription No    Weight --    Reps --      Arm Ergometer   Level 1    Watts 12    RPM 52    Minutes 15    METs 2.2      Track   Laps 16    Minutes 15    METs 3.04             Social History   Tobacco Use  Smoking Status Former   Types: Cigarettes   Quit date: 08/25/1961   Years since quitting: 61.2   Passive exposure: Never  Smokeless Tobacco Never    Goals Met:  Exercise tolerated well No report of concerns or symptoms today  Goals Unmet:  Not Applicable  Comments: Pt started cardiac rehab today.  Pt tolerated light exercise without difficulty. VSS, telemetry-Sinus Rhythm, asymptomatic.  Medication list reconciled. Pt denies barriers to medicaiton compliance.  PSYCHOSOCIAL ASSESSMENT:  PHQ-0. Pt exhibits positive coping skills, hopeful outlook with supportive family. No psychosocial needs identified at this time, no psychosocial interventions  necessary.    Pt enjoys watching [employer] games, church activities and spending time with daughter..   Pt oriented to exercise equipment and routine.    Understanding verbalized. Medication changes noted.Harrell Gave RN BSN    Dr. Fransico Him is Medical Director for Cardiac Rehab at [facility].

## 2022-10-24 ENCOUNTER — Encounter (HOSPITAL_COMMUNITY): Payer: PPO

## 2022-10-27 ENCOUNTER — Encounter (HOSPITAL_COMMUNITY): Payer: PPO

## 2022-10-27 ENCOUNTER — Encounter (HOSPITAL_COMMUNITY)
Admission: RE | Admit: 2022-10-27 | Discharge: 2022-10-27 | Disposition: A | Discharge status: Home / Self Care | Payer: PPO | Source: Ambulatory Visit | Attending: Cardiology | Admitting: Cardiology

## 2022-10-27 DIAGNOSIS — R079 Chest pain, unspecified: Secondary | ICD-10-CM | POA: Diagnosis present

## 2022-10-27 DIAGNOSIS — Z955 Presence of coronary angioplasty implant and graft: Secondary | ICD-10-CM | POA: Insufficient documentation

## 2022-10-27 DIAGNOSIS — Z48812 Encounter for surgical aftercare following surgery on the circulatory system: Secondary | ICD-10-CM | POA: Insufficient documentation

## 2022-10-29 ENCOUNTER — Encounter (HOSPITAL_COMMUNITY)
Admission: RE | Admit: 2022-10-29 | Discharge: 2022-10-29 | Disposition: A | Discharge status: Home / Self Care | Payer: PPO | Source: Ambulatory Visit | Attending: Cardiology | Admitting: Cardiology

## 2022-10-29 DIAGNOSIS — Z955 Presence of coronary angioplasty implant and graft: Secondary | ICD-10-CM

## 2022-10-29 DIAGNOSIS — R079 Chest pain, unspecified: Secondary | ICD-10-CM

## 2022-10-31 ENCOUNTER — Encounter (HOSPITAL_COMMUNITY)
Admission: RE | Admit: 2022-10-31 | Discharge: 2022-10-31 | Disposition: A | Discharge status: Home / Self Care | Payer: PPO | Source: Ambulatory Visit | Attending: Cardiology | Admitting: Cardiology

## 2022-10-31 DIAGNOSIS — Z955 Presence of coronary angioplasty implant and graft: Secondary | ICD-10-CM

## 2022-11-03 ENCOUNTER — Encounter (HOSPITAL_COMMUNITY)
Admission: RE | Admit: 2022-11-03 | Discharge: 2022-11-03 | Disposition: A | Discharge status: Home / Self Care | Payer: PPO | Source: Ambulatory Visit | Attending: Cardiology | Admitting: Cardiology

## 2022-11-03 ENCOUNTER — Ambulatory Visit: Payer: PPO | Admitting: Dermatology

## 2022-11-03 DIAGNOSIS — Z955 Presence of coronary angioplasty implant and graft: Secondary | ICD-10-CM

## 2022-11-05 ENCOUNTER — Encounter (HOSPITAL_COMMUNITY)
Admission: RE | Admit: 2022-11-05 | Discharge: 2022-11-05 | Disposition: A | Discharge status: Home / Self Care | Payer: PPO | Source: Ambulatory Visit | Attending: Cardiology | Admitting: Cardiology

## 2022-11-05 DIAGNOSIS — Z955 Presence of coronary angioplasty implant and graft: Secondary | ICD-10-CM | POA: Diagnosis not present

## 2022-11-06 DIAGNOSIS — H2513 Age-related nuclear cataract, bilateral: Secondary | ICD-10-CM | POA: Diagnosis not present

## 2022-11-06 DIAGNOSIS — H353131 Nonexudative age-related macular degeneration, bilateral, early dry stage: Secondary | ICD-10-CM | POA: Diagnosis not present

## 2022-11-06 DIAGNOSIS — H25013 Cortical age-related cataract, bilateral: Secondary | ICD-10-CM | POA: Diagnosis not present

## 2022-11-06 DIAGNOSIS — H2512 Age-related nuclear cataract, left eye: Secondary | ICD-10-CM | POA: Diagnosis not present

## 2022-11-06 DIAGNOSIS — H18413 Arcus senilis, bilateral: Secondary | ICD-10-CM | POA: Diagnosis not present

## 2022-11-07 ENCOUNTER — Encounter (HOSPITAL_COMMUNITY)
Admission: RE | Admit: 2022-11-07 | Discharge: 2022-11-07 | Disposition: A | Discharge status: Home / Self Care | Payer: PPO | Source: Ambulatory Visit | Attending: Cardiology | Admitting: Cardiology

## 2022-11-07 ENCOUNTER — Telehealth: Payer: Self-pay | Admitting: *Deleted

## 2022-11-07 DIAGNOSIS — Z955 Presence of coronary angioplasty implant and graft: Secondary | ICD-10-CM | POA: Diagnosis not present

## 2022-11-07 NOTE — Telephone Encounter (Signed)
   Patient Name: [name]  DOB: February 11, 1941 MRN: [id number]  Primary Cardiologist: Minus Breeding, MD  Chart reviewed as part of pre-operative protocol coverage. Cataract extractions are recognized in guidelines as low risk surgeries that do not typically require specific preoperative testing or holding of blood thinner therapy. Therefore, given past medical history and time since last visit, based on ACC/AHA guidelines, LOCRYN BIGGERS would be at acceptable risk for the planned procedure without further cardiovascular testing.   I will route this recommendation to the requesting party via Epic fax function and remove from pre-op pool.  Please call with questions.  Lenna Sciara, NP 11/07/2022, 12:45 PM

## 2022-11-07 NOTE — Telephone Encounter (Signed)
   Pre-operative Risk Assessment    Patient Name: [name]  DOB: Feb 15, 1941 MRN: [id number]      Request for Surgical Clearance    Procedure:   CATARACT EXTRACTION WITH INTROCULAR LENS IMPLANTATION OF THE LEFT EYE FOLLOWED BY THE RIGHT EYE  Date of Surgery:  Clearance 01/14/23      & 02/04/2023                           Surgeon:  DR. LISA SUN Surgeon's Group or Practice Name:  [facility] Phone number:  [phone] Fax number:  [phone]   Type of Clearance Requested:   - Medical    Type of Anesthesia:   TOPICAL ANESTHESIA WITH IV MEDICATION   Additional requests/questions:    Astrid Divine   11/07/2022, 12:38 PM

## 2022-11-10 ENCOUNTER — Encounter (HOSPITAL_COMMUNITY)
Admission: RE | Admit: 2022-11-10 | Discharge: 2022-11-10 | Disposition: A | Discharge status: Home / Self Care | Payer: PPO | Source: Ambulatory Visit | Attending: Cardiology | Admitting: Cardiology

## 2022-11-10 DIAGNOSIS — Z955 Presence of coronary angioplasty implant and graft: Secondary | ICD-10-CM

## 2022-11-12 ENCOUNTER — Other Ambulatory Visit: Payer: Self-pay | Admitting: Cardiology

## 2022-11-12 ENCOUNTER — Encounter (HOSPITAL_COMMUNITY)
Admission: RE | Admit: 2022-11-12 | Discharge: 2022-11-12 | Disposition: A | Discharge status: Home / Self Care | Payer: PPO | Source: Ambulatory Visit | Attending: Cardiology | Admitting: Cardiology

## 2022-11-12 DIAGNOSIS — Z955 Presence of coronary angioplasty implant and graft: Secondary | ICD-10-CM

## 2022-11-13 NOTE — Progress Notes (Signed)
Cardiac Individual Treatment Plan  Patient Details  Name: [name] MRN: [id number] Date of Birth: 12/09/1940 Referring Provider:   Flowsheet Row INTENSIVE CARDIAC REHAB ORIENT from 10/16/2022 in [facility]  Referring Provider Dr. Minus Breeding, MD       Initial Encounter Date:  [name] from 10/16/2022 in [facility]  Date 10/16/22       Visit Diagnosis: 09/03/22 S/P DES Ostial and Proximal LAD  Patient's Home Medications on Admission:  Current Outpatient Medications:    amLODipine (NORVASC) 10 MG tablet, TAKE ONE TABLET BY MOUTH EVERYDAY AT BEDTIME, Disp: 90 tablet, Rfl: 3   aspirin EC 81 MG tablet, Take 81 mg by mouth daily. Swallow whole., Disp: , Rfl:    atenolol (TENORMIN) 50 MG tablet, Take 50 mg by mouth 2 (two) times daily., Disp: , Rfl:    atorvastatin (LIPITOR) 40 MG tablet, Take 1 tablet (40 mg total) by mouth at bedtime., Disp: 90 tablet, Rfl: 3   clopidogrel (PLAVIX) 75 MG tablet, Take 1 tablet (75 mg total) by mouth daily with breakfast., Disp: 90 tablet, Rfl: 2   Coenzyme Q10 (CO Q 10 PO), Take 1 tablet by mouth daily., Disp: , Rfl:    ferrous sulfate 325 (65 FE) MG tablet, Take 1 tablet (325 mg total) by mouth daily with breakfast., Disp: 90 tablet, Rfl: 1   hydrALAZINE (APRESOLINE) 25 MG tablet, Take 1 tablet (25 mg total) by mouth 3 (three) times daily., Disp: 180 tablet, Rfl: 0   isosorbide mononitrate (IMDUR) 60 MG 24 hr tablet, Take 1 tablet (60 mg total) by mouth daily., Disp: 30 tablet, Rfl: 0   KRILL OIL PO, Take 100 mg by mouth daily. Mega Red, Disp: , Rfl:    lidocaine (HM LIDOCAINE PATCH) 4 %, Place 1 patch onto the skin as needed., Disp: , Rfl:    lisinopril (ZESTRIL) 10 MG tablet, Take 1 tablet (10 mg total) by mouth daily. TAKE ONE TABLET BY MOUTH EVERYDAY AT BEDTIME, Disp: 60 tablet, Rfl: 0    Multiple Vitamins-Minerals (CENTRUM SILVER 50+MEN PO), Take 1 tablet by mouth., Disp: , Rfl:    Multiple Vitamins-Minerals (PRESERVISION AREDS 2) CAPS, Take 1 capsule by mouth 2 (two) times daily., Disp: , Rfl:    nitroGLYCERIN (NITROSTAT) 0.4 MG SL tablet, DISSOLVE 1 TABLET UNDER THE TONGUE EVERY 5 MINUTES AS NEEDED FOR CHEST PAIN. DO NOT EXCEED A TOTAL OF 3 DOSES IN 15 MINUTES., Disp: 25 tablet, Rfl: 2   pantoprazole (PROTONIX) 40 MG tablet, TAKE ONE TABLET BY MOUTH EVERY MORNING, Disp: 90 tablet, Rfl: 0   Polyethyl Glycol-Propyl Glycol (SYSTANE) 0.4-0.3 % SOLN, Place 2 drops into both eyes every morning., Disp: , Rfl:    primidone (MYSOLINE) 50 MG tablet, TAKE ONE TABLET BY MOUTH EVERY MORNING and TAKE ONE TABLET BY MOUTH EVERYDAY AT BEDTIME, Disp: 180 tablet, Rfl: 0  Past Medical History: 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 - decreased dose of HCTZ to M,F  Diverticulosis    DJD (degenerative joint disease)    Duodenal ulcer    ED (erectile dysfunction)    GERD (gastroesophageal reflux disease)    increased s/s in the last week 03-22-19 PV    Hemorrhoids    Hyperlipidemia    Hypertension    Myocardial infarction ([place])    2002   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   mid frontal scalp-[id number]   SCC (squamous cell carcinoma) 06/15/2019   front middle scalp-tx cx63fu   SCC (squamous cell carcinoma) 12/27/2002   CIS-right angle of jaw--[id number]   SCC (squamous cell carcinoma) 09/24/2004   CIS--mid post crown, left mis crown medial,left first web space-tx cx22fu   SCC (squamous cell carcinoma) 06/03/2006   SCCA-right temple-tx cx75fu   SCC (squamous cell carcinoma) 06/03/2006    CIS-right forehead, left mis crown scalp medial-tx cx22fu   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/30/2006   right temple lateral-txpbx   SCC (squamous cell carcinoma) 11/10/2006   left forehead, left hand proximal   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/02/2009   left scalp-[id number]   SCC (squamous cell carcinoma) 01/24/2015   left scalp-[id number]   SCC (squamous cell carcinoma) 11/12/2015   front lateralscalp-cx24fu, medial scalp-[id number]   SCC (squamous cell carcinoma) 12/17/2016   front center scalp-[id number]   Squamous cell carcinoma of skin 08/10/2017   CIS--right ear rim, top of scalp, left side scalp-tx-cx52fu    Tobacco Use: Social History   Tobacco Use  Smoking Status Former   Types: Cigarettes   Quit date: 08/25/1961   Years since quitting: 61.2   Passive exposure: Never  Smokeless Tobacco Never    Labs: Review Flowsheet  More data exists      Latest Ref Rng & Units 03/08/2018 03/10/2019 03/12/2020 03/14/2021 12/26/2021  Labs for ITP Cardiac and Pulmonary Rehab  Cholestrol 0 - 200 mg/dL 139  126  124  130  123   LDL (calc) 0 - 99 mg/dL 59  64  60  55  51   HDL-C >39.00 mg/dL 50.40  42.40  44  48.70  48.40   Trlycerides 0.0 - 149.0 mg/dL 149.0  101.0  121  135.0  119.0     Capillary Blood Glucose: No results found for: "GLUCAP"   Exercise Target Goals: Exercise Program Goal: Individual exercise prescription set using results from initial 6 min walk test and THRR while considering  patient's activity barriers and safety.   Exercise Prescription Goal: Initial exercise prescription builds to 30-45 minutes a day of aerobic activity, 2-3 days per week.  Home exercise guidelines will be given to patient during program as part of exercise prescription that the participant will acknowledge.  Activity Barriers & Risk Stratification:  Activity Barriers & Cardiac Risk Stratification - 10/16/22 1438       Activity Barriers & Cardiac Risk Stratification   Activity Barriers  Arthritis;Deconditioning;Shortness of Breath;Joint Problems;Neck/Spine Problems;Back Problems;Balance Concerns    Cardiac Risk Stratification High   Under 5 Mets 6MWT            6 Minute Walk:  6 Minute Walk     Row Name 10/16/22 1435         6 Minute Walk   Phase Initial     Distance 1040 feet     Walk Time 6 minutes     # of Rest Breaks 0     MPH 1.97     METS 1.53     RPE 12     Perceived Dyspnea  1     VO2 Peak 5.35     Symptoms Yes (comment)     Comments Chest fullness at end of walk test-resolved with rest     Resting HR 55 bpm     Resting BP 132/62     Resting Oxygen Saturation  96 %     Exercise Oxygen Saturation  during 6 min walk 96 %     Max Ex. HR 87 bpm     Max Ex. BP 144/72     2 Minute Post BP 120/68              Oxygen Initial Assessment:   Oxygen Re-Evaluation:   Oxygen Discharge (Final Oxygen Re-Evaluation):   Initial Exercise Prescription:  Initial Exercise Prescription - 10/16/22 1400       Date of Initial Exercise RX and Referring Provider   Date 10/16/22    Referring Provider Dr. Minus Breeding, MD    Expected Discharge Date 12/26/22      Arm Ergometer   Level 1    RPM 40    Minutes 15    METs 1.4      Track   Laps 15    Minutes 15    METs 1.6      Prescription Details   Frequency (times per week) 3    Duration Progress to 30 minutes of continuous aerobic without signs/symptoms of physical distress      Intensity   THRR 40-80% of Max Heartrate 54-109    Ratings of Perceived Exertion 11-13    Perceived Dyspnea 0-4      Progression   Progression Continue progressive overload as per policy without signs/symptoms or physical distress.      Resistance Training   Training Prescription Yes    Weight 3    Reps 10-15             Perform Capillary Blood Glucose checks as needed.  Exercise Prescription Changes:   Exercise Prescription Changes     Row Name 10/22/22 1640 11/12/22 1628           Response  to Exercise   Blood Pressure (Admit) 122/72 120/76      Blood Pressure (Exercise) 132/70 132/74      Blood Pressure (Exit) 99/61 102/68      Heart Rate (Admit) 53 bpm 51 bpm      Heart Rate (Exercise) 78 bpm 90 bpm      Heart Rate (Exit) 57 bpm 60 bpm      Rating of Perceived Exertion (Exercise) 12 11.5      Perceived Dyspnea (Exercise) 0 0      Symptoms none none      Comments Pt first day in teh Pritikin ICR program Reviewed METs and goals      Duration Progress to 30 minutes of  aerobic without signs/symptoms of physical distress Progress to 30 minutes of  aerobic without signs/symptoms of physical distress      Intensity THRR unchanged THRR unchanged        Progression   Progression Continue to progress workloads to maintain intensity without signs/symptoms of physical distress. Continue to progress workloads to maintain intensity without signs/symptoms of physical distress.      Average METs 2.62 3.22        Resistance Training   Training Prescription No No      Weight -- --      Reps -- --        Arm Ergometer  Level 1 2      Watts 12 20      RPM 52 67      Minutes 15 15      METs 2.2 3        Track   Laps 16 19      Minutes 15 15      METs 3.04 3.43               Exercise Comments:   Exercise Comments     Row Name 10/22/22 1651 11/12/22 1637         Exercise Comments Pt first day in the CRP2 program. Pt tolerated exercise well with an average MET level of 2.62. Pt is learning his THRR, RPE and ExRx. Off to a great start Reviewed MET's and goals. Pt tolerated exercise well with an average MET level of 3.22. Pt feels good about his goals of gaining strenght and stamina. He is progressing well and feels good. Will review home Exercise soon               Exercise Goals and Review:   Exercise Goals     Row Name 10/16/22 1445             Exercise Goals   Increase Physical Activity Yes       Intervention Provide advice, education, support and  counseling about physical activity/exercise needs.;Develop an individualized exercise prescription for aerobic and resistive training based on initial evaluation findings, risk stratification, comorbidities and participant's personal goals.       Expected Outcomes Short Term: Attend rehab on a regular basis to increase amount of physical activity.;Long Term: Exercising regularly at least 3-5 days a week.;Long Term: Add in home exercise to make exercise part of routine and to increase amount of physical activity.       Increase Strength and Stamina Yes       Intervention Provide advice, education, support and counseling about physical activity/exercise needs.;Develop an individualized exercise prescription for aerobic and resistive training based on initial evaluation findings, risk stratification, comorbidities and participant's personal goals.       Expected Outcomes Short Term: Increase workloads from initial exercise prescription for resistance, speed, and METs.;Short Term: Perform resistance training exercises routinely during rehab and add in resistance training at home;Long Term: Improve cardiorespiratory fitness, muscular endurance and strength as measured by increased METs and functional capacity (6MWT)       Able to understand and use rate of perceived exertion (RPE) scale Yes       Intervention Provide education and explanation on how to use RPE scale       Expected Outcomes Short Term: Able to use RPE daily in rehab to express subjective intensity level;Long Term:  Able to use RPE to guide intensity level when exercising independently       Able to understand and use Dyspnea scale Yes       Intervention Provide education and explanation on how to use Dyspnea scale       Expected Outcomes Short Term: Able to use Dyspnea scale daily in rehab to express subjective sense of shortness of breath during exertion;Long Term: Able to use Dyspnea scale to guide intensity level when exercising independently        Knowledge and understanding of Target Heart Rate Range (THRR) Yes       Intervention Provide education and explanation of THRR including how the numbers were predicted and where they are located for reference  Expected Outcomes Short Term: Able to state/look up THRR;Long Term: Able to use THRR to govern intensity when exercising independently;Short Term: Able to use daily as guideline for intensity in rehab       Understanding of Exercise Prescription Yes       Intervention Provide education, explanation, and written materials on patient's individual exercise prescription       Expected Outcomes Short Term: Able to explain program exercise prescription;Long Term: Able to explain home exercise prescription to exercise independently                Exercise Goals Re-Evaluation :  Exercise Goals Re-Evaluation     Row Name 10/22/22 1650 11/12/22 1633           Exercise Goal Re-Evaluation   Exercise Goals Review Increase Physical Activity;Understanding of Exercise Prescription;Increase Strength and Stamina;Knowledge and understanding of Target Heart Rate Range (THRR);Able to understand and use rate of perceived exertion (RPE) scale Increase Physical Activity;Understanding of Exercise Prescription;Increase Strength and Stamina;Knowledge and understanding of Target Heart Rate Range (THRR);Able to understand and use rate of perceived exertion (RPE) scale      Comments Pt first day in the CRP2 program. Pt tolerated exercise well with an average MET level of 2.62. Pt is learning his THRR, RPE and ExRx. Off to a great start Reviewed MET's and goals. Pt tolerated exercise well with an average MET level of 3.22. Pt feels good about his goals of gaining strenght and stamina. He is progressing well and feels good. Will review home Exercise soon      Expected Outcomes Will continue to monitor pt and progress workloads as tolerated without sign or symptom Will continue to monitor pt and progress  workloads as tolerated without sign or symptom               Discharge Exercise Prescription (Final Exercise Prescription Changes):  Exercise Prescription Changes - 11/12/22 1628       Response to Exercise   Blood Pressure (Admit) 120/76    Blood Pressure (Exercise) 132/74    Blood Pressure (Exit) 102/68    Heart Rate (Admit) 51 bpm    Heart Rate (Exercise) 90 bpm    Heart Rate (Exit) 60 bpm    Rating of Perceived Exertion (Exercise) 11.5    Perceived Dyspnea (Exercise) 0    Symptoms none    Comments Reviewed METs and goals    Duration Progress to 30 minutes of  aerobic without signs/symptoms of physical distress    Intensity THRR unchanged      Progression   Progression Continue to progress workloads to maintain intensity without signs/symptoms of physical distress.    Average METs 3.22      Resistance Training   Training Prescription No      Arm Ergometer   Level 2    Watts 20    RPM 67    Minutes 15    METs 3      Track   Laps 19    Minutes 15    METs 3.43             Nutrition:  Target Goals: Understanding of nutrition guidelines, daily intake of sodium 1500mg , cholesterol 200mg , calories 30% from fat and 7% or less from saturated fats, daily to have 5 or more servings of fruits and vegetables.  Biometrics:  Pre Biometrics - 10/16/22 1124       Pre Biometrics   Waist Circumference 40 inches    Hip Circumference  39 inches    Waist to Hip Ratio 1.03 %    Triceps Skinfold 16 mm    % Body Fat 29.2 %    Grip Strength 35 kg    Flexibility 5 in    Single Leg Stand 12 seconds              Nutrition Therapy Plan and Nutrition Goals:  Nutrition Therapy & Goals - 10/22/22 1622       Nutrition Therapy   Diet heart healthy diet    Drug/Food Interactions Statins/Certain Fruits      Personal Nutrition Goals   Nutrition Goal Patient to identify strategies for reducing cardiovascular risk by attending the weekly Pritikin education and nutrition  series    Personal Goal #2 Patient to improve diet quality by using the plate method as a daily guide for meal planning to include lean protein/plant protein, fruits, vegetables, whole grains, nonfat dairy as part of well balanced diet    Personal Goal #3 Patient to reduce sodium to 1500mg  per day    Comments Joe will benefit from participation in intensive cardiac rehab for nutrition, exercise, and lifestyle modification.      Intervention Plan   Intervention Prescribe, educate and counsel regarding individualized specific dietary modifications aiming towards targeted core components such as weight, hypertension, lipid management, diabetes, heart failure and other comorbidities.;Nutrition handout(s) given to patient.    Expected Outcomes Short Term Goal: Understand basic principles of dietary content, such as calories, fat, sodium, cholesterol and nutrients.;Long Term Goal: Adherence to prescribed nutrition plan.             Nutrition Assessments:  MEDIFICTS Score Key: ?70 Need to make dietary changes  40-70 Heart Healthy Diet ? 40 Therapeutic Level Cholesterol Diet    Picture Your Plate Scores: [id number] Unhealthy dietary pattern with much room for improvement. 41-50 Dietary pattern unlikely to meet recommendations for good health and room for improvement. 51-60 More healthful dietary pattern, with some room for improvement.  >60 Healthy dietary pattern, although there may be some specific behaviors that could be improved.    Nutrition Goals Re-Evaluation:  Nutrition Goals Re-Evaluation     [place] Name 10/22/22 1622             Goals   Current Weight 173 lb 8 oz (78.7 kg)       Comment lipids WNL, lipoprotein A 114.3, zinc elevated- recommended to reduce OTC supplements       Expected Outcome Joe will benefit from participation in intensive cardiac rehab for nutrition, exercise, and lifestyle modification.                Nutrition Goals Re-Evaluation:  Nutrition Goals  Re-Evaluation     [place] Name 10/22/22 1622             Goals   Current Weight 173 lb 8 oz (78.7 kg)       Comment lipids WNL, lipoprotein A 114.3, zinc elevated- recommended to reduce OTC supplements       Expected Outcome Joe will benefit from participation in intensive cardiac rehab for nutrition, exercise, and lifestyle modification.                Nutrition Goals Discharge (Final Nutrition Goals Re-Evaluation):  Nutrition Goals Re-Evaluation - 10/22/22 1622       Goals   Current Weight 173 lb 8 oz (78.7 kg)    Comment lipids WNL, lipoprotein A 114.3, zinc elevated- recommended to reduce OTC supplements  Expected Outcome Joe will benefit from participation in intensive cardiac rehab for nutrition, exercise, and lifestyle modification.             Psychosocial: Target Goals: Acknowledge presence or absence of significant depression and/or stress, maximize coping skills, provide positive support system. Participant is able to verbalize types and ability to use techniques and skills needed for reducing stress and depression.  Initial Review & Psychosocial Screening:  Initial Psych Review & Screening - 10/16/22 1312       Initial Review   Current issues with None Identified      Family Dynamics   Good Support System? Yes   Wille Glaser has his wife for support     Barriers   Psychosocial barriers to participate in program There are no identifiable barriers or psychosocial needs.      Screening Interventions   Interventions Encouraged to exercise             Quality of Life Scores:  Quality of Life - 10/16/22 1447       Quality of Life   Select Quality of Life      Quality of Life Scores   Health/Function Pre 28.47 %    Socioeconomic Pre 29.58 %    Psych/Spiritual Pre 30 %    Family Pre 28.8 %    GLOBAL Pre 29.05 %            Scores of 19 and below usually indicate a poorer quality of life in these areas.  A difference of  2-3 points is a clinically  meaningful difference.  A difference of 2-3 points in the total score of the Quality of Life Index has been associated with significant improvement in overall quality of life, self-image, physical symptoms, and general health in studies assessing change in quality of life.  PHQ-9: Review Flowsheet  More data exists      10/16/2022 03/21/2022 03/20/2021 03/14/2021 10/04/2020  Depression screen PHQ 2/9  Decreased Interest 0 0 0 0 0 0  Down, Depressed, Hopeless 0 0 0 0 0 0  PHQ - 2 Score 0 0 0 0 0 0  Altered sleeping 0 0 - - - -  Tired, decreased energy 0 0 - - - -  Change in appetite 0 0 - - - -  Feeling bad or failure about yourself  0 0 - - - -  Trouble concentrating 0 0 - - - -  Moving slowly or fidgety/restless 0 0 - - - -  Suicidal thoughts 0 0 - - - -  PHQ-9 Score 0 0 - - - -  Difficult doing work/chores Not difficult at all - - - -   Interpretation of Total Score  Total Score Depression Severity:  1-4 = Minimal depression, 5-9 = Mild depression, 10-14 = Moderate depression, 15-19 = Moderately severe depression, 20-27 = Severe depression   Psychosocial Evaluation and Intervention:   Psychosocial Re-Evaluation:  Psychosocial Re-Evaluation     Row Name 11/13/22 [phone]             Psychosocial Re-Evaluation   Current issues with None Identified       Interventions Encouraged to attend Cardiac Rehabilitation for the exercise       Continue Psychosocial Services  No Follow up required                Psychosocial Discharge (Final Psychosocial Re-Evaluation):  Psychosocial Re-Evaluation - 11/13/22 0741       Psychosocial Re-Evaluation   Current  issues with None Identified    Interventions Encouraged to attend Cardiac Rehabilitation for the exercise    Continue Psychosocial Services  No Follow up required             Vocational Rehabilitation: Provide vocational rehab assistance to qualifying candidates.   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 Joe does volunteer work            Education: Education Goals: Education classes will be provided on a weekly basis, covering required topics. Participant will state understanding/return demonstration of topics presented.    Education     Row Name 10/22/22 1600     Education   Cardiac Education Topics Pritikin   Lexicographer Nutrition   Nutrition Facts on Fat   Instruction Review Code 1- Verbalizes Understanding   Class Start Time 1400   Class Stop Time 1450   Class Time Calculation (min) 50 min    [place] Name 10/29/22 1500     Education   Cardiac Education Topics Pritikin   Radio broadcast assistant   Weekly Topic Adding Flavor - Sodium-Free   Instruction Review Code 1- Verbalizes Understanding   Class Start Time 1400   Class Stop Time 1453   Class Time Calculation (min) 53 min    [place] Name 10/31/22 1500     Education   Cardiac Education Topics Pritikin   Architect Education   General Education Heart Disease Risk Reduction   Instruction Review Code 1- Verbalizes Understanding   Class Start Time 1355   Class Stop Time 1440   Class Time Calculation (min) 45 min    [place] Name 11/03/22 1500     Education   Cardiac Education Topics Pritikin   Environmental consultant Exercise   Exercise Workshop Hotel manager and Fall Prevention   Instruction Review Code 1- Verbalizes Understanding   Class Start Time 1350   Class Stop Time 1432   Class Time Calculation (min) 42 min    [place] Name 11/05/22 1500     Education   Cardiac Education  Topics Pritikin   Financial trader   Weekly Topic Fast and Healthy Breakfasts   Instruction Review Code 1- Verbalizes Understanding   Class Start Time 1355   Class Stop Time 1450   Class Time Calculation (min) 55 min    [place] Name 11/07/22 1600     Education   Cardiac Education Topics Pritikin   Scientist, research (life sciences)   Educator Dietitian   Nutrition Overview of the Pritikin Eating Plan   Instruction Review Code 1- Verbalizes Understanding   Class Start Time 1400   Class Stop Time 1436   Class Time Calculation (min) 36 min    [place] Name 11/10/22 1600     Education   Cardiac Education Topics [place]   Financial planner  Exercise Physiologist   Select Psychosocial   Psychosocial Workshop Recognizing and Reducing Stress   Instruction Review Code 1- Verbalizes Understanding   Class Start Time 1405   Class Stop Time 1454   Class Time Calculation (min) 49 min    [place] Name 11/12/22 1600     Education   Cardiac Education Topics Pritikin   Financial trader   Weekly Topic Personalizing Your Pritikin Plate   Instruction Review Code 1- Verbalizes Understanding   Class Start Time 1400   Class Stop Time 1448   Class Time Calculation (min) 48 min            Core Videos: Exercise    Move It!  Clinical staff conducted group or individual video education with verbal and written material and guidebook.  Patient learns the recommended Pritikin exercise program. Exercise with the goal of living a long, healthy life. Some of the health benefits of exercise include controlled diabetes, healthier blood pressure levels, improved cholesterol levels, improved heart and lung capacity, improved sleep, and better body composition. Everyone should speak with their doctor before starting or changing an exercise routine.  Biomechanical Limitations Clinical staff  conducted group or individual video education with verbal and written material and guidebook.  Patient learns how biomechanical limitations can impact exercise and how we can mitigate and possibly overcome limitations to have an impactful and balanced exercise routine.  Body Composition Clinical staff conducted group or individual video education with verbal and written material and guidebook.  Patient learns that body composition (ratio of muscle mass to fat mass) is a key component to assessing overall fitness, rather than body weight alone. Increased fat mass, especially visceral belly fat, can put [place] at increased risk for metabolic syndrome, type 2 diabetes, heart disease, and even death. It is recommended to combine diet and exercise (cardiovascular and resistance training) to improve your body composition. Seek guidance from your physician and exercise physiologist before implementing an exercise routine.  Exercise Action Plan Clinical staff conducted group or individual video education with verbal and written material and guidebook.  Patient learns the recommended strategies to achieve and enjoy long-term exercise adherence, including variety, self-motivation, self-efficacy, and positive decision making. Benefits of exercise include fitness, good health, weight management, more energy, better sleep, less stress, and overall well-being.  Medical   Heart Disease Risk Reduction Clinical staff conducted group or individual video education with verbal and written material and guidebook.  Patient learns our heart is our most vital organ as it circulates oxygen, nutrients, white blood cells, and hormones throughout the entire body, and carries waste away. Data supports a plant-based eating plan like the Pritikin Program for its effectiveness in slowing progression of and reversing heart disease. The video provides a number of recommendations to address heart disease.   Metabolic Syndrome and Belly  Fat  Clinical staff conducted group or individual video education with verbal and written material and guidebook.  Patient learns what metabolic syndrome is, how it leads to heart disease, and how one can reverse it and keep it from coming back. You have metabolic syndrome if you have 3 of the following 5 criteria: abdominal obesity, high blood pressure, high triglycerides, low HDL cholesterol, and high blood sugar.  Hypertension and Heart Disease Clinical staff conducted group or individual video education with verbal and written material and guidebook.  Patient learns that high blood pressure, or hypertension, is very common in the [place]. Hypertension is largely due to excessive salt intake, but other important risk factors include being overweight, physical inactivity, drinking too much alcohol, smoking, and not eating enough potassium from fruits and vegetables. High blood pressure is a leading risk factor for heart attack, stroke, congestive heart failure, dementia, kidney failure, and premature death. Long-term effects of excessive salt intake include stiffening of the arteries and thickening of heart muscle and organ damage. Recommendations include ways to reduce hypertension and the risk of heart disease.  Diseases of Our Time - Focusing on Diabetes Clinical staff conducted group or individual video education with verbal and written material and guidebook.  Patient learns why the best way to stop diseases of our time is prevention, through food and other lifestyle changes. Medicine (such as prescription pills and surgeries) is often only a Band-Aid on the problem, not a long-term solution. Most common diseases of our time include obesity, type 2 diabetes, hypertension, heart disease, and cancer. The Pritikin Program is recommended and has been proven to help reduce, reverse, and/or prevent the damaging effects of metabolic syndrome.  Nutrition   Overview of the Pritikin Eating Plan   Clinical staff conducted group or individual video education with verbal and written material and guidebook.  Patient learns about the [place] for disease risk reduction. The [place] emphasizes a wide variety of unrefined, minimally-processed carbohydrates, like fruits, vegetables, whole grains, and legumes. Go, Caution, and Stop food choices are explained. Plant-based and lean animal proteins are emphasized. Rationale provided for low sodium intake for blood pressure control, low added sugars for blood sugar stabilization, and low added fats and oils for coronary artery disease risk reduction and weight management.  Calorie Density  Clinical staff conducted group or individual video education with verbal and written material and guidebook.  Patient learns about calorie density and how it impacts the Pritikin Eating Plan. Knowing the characteristics of the food you choose will help you decide whether those foods will lead to weight gain or weight loss, and whether you want to consume more or less of them. Weight loss is usually a side effect of the Pritikin Eating Plan because of its focus on low calorie-dense foods.  Label Reading  Clinical staff conducted group or individual video education with verbal and written material and guidebook.  Patient learns about the Pritikin recommended label reading guidelines and corresponding recommendations regarding calorie density, added sugars, sodium content, and whole grains.  Dining Out - Part 1  Clinical staff conducted group or individual video education with verbal and written material and guidebook.  Patient learns that restaurant meals can be sabotaging because they can be so high in calories, fat, sodium, and/or sugar. Patient learns recommended strategies on how to positively address this and avoid unhealthy pitfalls.  Facts on Fats  Clinical staff conducted group or individual video education with verbal and written  material and guidebook.  Patient learns that lifestyle modifications can be just as effective, if not more so, as many medications for lowering your risk of heart disease. A Pritikin lifestyle can help to reduce your risk of inflammation and atherosclerosis (cholesterol build-up, or plaque, in the artery walls). Lifestyle interventions such as dietary choices and physical activity address the cause of atherosclerosis. A review of the types of fats and their impact on blood cholesterol levels, along with dietary recommendations to reduce fat intake is also included.  Nutrition Action Plan  Clinical staff conducted group or individual video education with verbal and written  material and guidebook.  Patient learns how to incorporate Pritikin recommendations into their lifestyle. Recommendations include planning and keeping personal health goals in mind as an important part of their success.  Healthy Mind-Set    Healthy Minds, Bodies, Hearts  Clinical staff conducted group or individual video education with verbal and written material and guidebook.  Patient learns how to identify when they are stressed. Video will discuss the impact of that stress, as well as the many benefits of stress management. Patient will also be introduced to stress management techniques. The way we think, act, and feel has an impact on our hearts.  How Our Thoughts Can Heal Our Hearts  Clinical staff conducted group or individual video education with verbal and written material and guidebook.  Patient learns that negative thoughts can cause depression and anxiety. This can result in negative lifestyle behavior and serious health problems. Cognitive behavioral therapy is an effective method to help control our thoughts in order to change and improve our emotional outlook.  Additional Videos:  Exercise    Improving Performance  Clinical staff conducted group or individual video education with verbal and written material and  guidebook.  Patient learns to use a non-linear approach by alternating intensity levels and lengths of time spent exercising to help burn more calories and lose more body fat. Cardiovascular exercise helps improve heart health, metabolism, hormonal balance, blood sugar control, and recovery from fatigue. Resistance training improves strength, endurance, balance, coordination, reaction time, metabolism, and muscle mass. Flexibility exercise improves circulation, posture, and balance. Seek guidance from your physician and exercise physiologist before implementing an exercise routine and learn your capabilities and proper form for all exercise.  Introduction to Yoga  Clinical staff conducted group or individual video education with verbal and written material and guidebook.  Patient learns about yoga, a discipline of the coming together of mind, breath, and body. The benefits of yoga include improved flexibility, improved range of motion, better posture and core strength, increased lung function, weight loss, and positive self-image. Yoga's heart health benefits include lowered blood pressure, healthier heart rate, decreased cholesterol and triglyceride levels, improved immune function, and reduced stress. Seek guidance from your physician and exercise physiologist before implementing an exercise routine and learn your capabilities and proper form for all exercise.  Medical   Aging: Enhancing Your Quality of Life  Clinical staff conducted group or individual video education with verbal and written material and guidebook.  Patient learns key strategies and recommendations to stay in good physical health and enhance quality of life, such as prevention strategies, having an advocate, securing a [place], and keeping a list of medications and system for tracking them. It also discusses how to avoid risk for bone loss.  Biology of Weight Control  Clinical staff conducted group or  individual video education with verbal and written material and guidebook.  Patient learns that weight gain occurs because we consume more calories than we burn (eating more, moving less). Even if your body weight is normal, you may have higher ratios of fat compared to muscle mass. Too much body fat puts you at increased risk for cardiovascular disease, heart attack, stroke, type 2 diabetes, and obesity-related cancers. In addition to exercise, following the [employer] can help reduce your risk.  Decoding Lab Results  Clinical staff conducted group or individual video education with verbal and written material and guidebook.  Patient learns that lab test reflects one measurement whose values change over  time and are influenced by many factors, including medication, stress, sleep, exercise, food, hydration, pre-existing medical conditions, and more. It is recommended to use the knowledge from this video to become more involved with your lab results and evaluate your numbers to speak with your doctor.   Diseases of Our Time - Overview  Clinical staff conducted group or individual video education with verbal and written material and guidebook.  Patient learns that according to the CDC, 50% to 70% of chronic diseases (such as obesity, type 2 diabetes, elevated lipids, hypertension, and heart disease) are avoidable through lifestyle improvements including healthier food choices, listening to satiety cues, and increased physical activity.  Sleep Disorders Clinical staff conducted group or individual video education with verbal and written material and guidebook.  Patient learns how good quality and duration of sleep are important to overall health and well-being. Patient also learns about sleep disorders and how they impact health along with recommendations to address them, including discussing with a physician.  Nutrition  Dining Out - Part 2 Clinical staff conducted group or individual video  education with verbal and written material and guidebook.  Patient learns how to plan ahead and communicate in order to maximize their dining experience in a healthy and nutritious manner. Included are recommended food choices based on the type of restaurant the patient is visiting.   Fueling a Best boy conducted group or individual video education with verbal and written material and guidebook.  There is a strong connection between our food choices and our health. Diseases like obesity and type 2 diabetes are very prevalent and are in large-part due to lifestyle choices. The Pritikin Eating Plan provides plenty of food and hunger-curbing satisfaction. It is easy to follow, affordable, and helps reduce health risks.  Menu Workshop  Clinical staff conducted group or individual video education with verbal and written material and guidebook.  Patient learns that restaurant meals can sabotage health goals because they are often packed with calories, fat, sodium, and sugar. Recommendations include strategies to plan ahead and to communicate with the manager, chef, or server to help order a healthier meal.  Planning Your Eating Strategy  Clinical staff conducted group or individual video education with verbal and written material and guidebook.  Patient learns about the [place] and its benefit of reducing the risk of disease. The [place] does not focus on calories. Instead, it emphasizes high-quality, nutrient-rich foods. By knowing the characteristics of the foods, we choose, we can determine their calorie density and make informed decisions.  Targeting Your Nutrition Priorities  Clinical staff conducted group or individual video education with verbal and written material and guidebook.  Patient learns that lifestyle habits have a tremendous impact on disease risk and progression. This video provides eating and physical activity recommendations based on your  personal health goals, such as reducing LDL cholesterol, losing weight, preventing or controlling type 2 diabetes, and reducing high blood pressure.  Vitamins and Minerals  Clinical staff conducted group or individual video education with verbal and written material and guidebook.  Patient learns different ways to obtain key vitamins and minerals, including through a recommended healthy diet. It is important to discuss all supplements you take with your doctor.   Healthy Mind-Set    Smoking Cessation  Clinical staff conducted group or individual video education with verbal and written material and guidebook.  Patient learns that cigarette smoking and tobacco addiction pose a serious health risk which affects millions of  people. Stopping smoking will significantly reduce the risk of heart disease, lung disease, and many forms of cancer. Recommended strategies for quitting are covered, including working with your doctor to develop a successful plan.  Culinary   Becoming a Financial trader conducted group or individual video education with verbal and written material and guidebook.  Patient learns that cooking at home can be healthy, cost-effective, quick, and puts them in control. Keys to cooking healthy recipes will include looking at your recipe, assessing your equipment needs, planning ahead, making it simple, choosing cost-effective seasonal ingredients, and limiting the use of added fats, salts, and sugars.  Cooking - Breakfast and Snacks  Clinical staff conducted group or individual video education with verbal and written material and guidebook.  Patient learns how important breakfast is to satiety and nutrition through the entire day. Recommendations include key foods to eat during breakfast to help stabilize blood sugar levels and to prevent overeating at meals later in the day. Planning ahead is also a key component.  Cooking - Human resources officer conducted  group or individual video education with verbal and written material and guidebook.  Patient learns eating strategies to improve overall health, including an approach to cook more at home. Recommendations include thinking of animal protein as a side on your plate rather than center stage and focusing instead on lower calorie dense options like vegetables, fruits, whole grains, and plant-based proteins, such as beans. Making sauces in large quantities to freeze for later and leaving the skin on your vegetables are also recommended to maximize your experience.  Cooking - Healthy Salads and Dressing Clinical staff conducted group or individual video education with verbal and written material and guidebook.  Patient learns that vegetables, fruits, whole grains, and legumes are the foundations of the [place]. Recommendations include how to incorporate each of these in flavorful and healthy salads, and how to create homemade salad dressings. Proper handling of ingredients is also covered. Cooking - Soups and [employer] - Soups and Desserts Clinical staff conducted group or individual video education with verbal and written material and guidebook.  Patient learns that Pritikin soups and desserts make for easy, nutritious, and delicious snacks and meal components that are low in sodium, fat, sugar, and calorie density, while high in vitamins, minerals, and filling fiber. Recommendations include simple and healthy ideas for soups and desserts.   Overview     The Pritikin Solution Program Overview Clinical staff conducted group or individual video education with verbal and written material and guidebook.  Patient learns that the results of the [place] Program have been documented in more than 100 articles published in peer-reviewed journals, and the benefits include reducing risk factors for (and, in some cases, even reversing) high cholesterol, high blood pressure, type 2 diabetes, obesity,  and more! An overview of the three key pillars of the Pritikin Program will be covered: eating well, doing regular exercise, and having a healthy mind-set.  WORKSHOPS  Exercise: Exercise Basics: Building Your Action Plan Clinical staff led group instruction and group discussion with PowerPoint presentation and patient guidebook. To enhance the learning environment the use of posters, models and videos may be added. At the conclusion of this workshop, patients will comprehend the difference between physical activity and exercise, as well as the benefits of incorporating both, into their routine. Patients will understand the FITT (Frequency, Intensity, Time, and Type) principle and how to use it to build an exercise action  plan. In addition, safety concerns and other considerations for exercise and cardiac rehab will be addressed by the presenter. The purpose of this lesson is to promote a comprehensive and effective weekly exercise routine in order to improve patients' overall level of fitness.   Managing Heart Disease: Your Path to a Healthier Heart Clinical staff led group instruction and group discussion with PowerPoint presentation and patient guidebook. To enhance the learning environment the use of posters, models and videos may be added.At the conclusion of this workshop, patients will understand the anatomy and physiology of the heart. Additionally, they will understand how Pritikin's three pillars impact the risk factors, the progression, and the management of heart disease.  The purpose of this lesson is to provide a high-level overview of the heart, heart disease, and how the Pritikin lifestyle positively impacts risk factors.  Exercise Biomechanics Clinical staff led group instruction and group discussion with PowerPoint presentation and patient guidebook. To enhance the learning environment the use of posters, models and videos may be added. Patients will learn how the structural  parts of their bodies function and how these functions impact their daily activities, movement, and exercise. Patients will learn how to promote a neutral spine, learn how to manage pain, and identify ways to improve their physical movement in order to promote healthy living. The purpose of this lesson is to expose patients to common physical limitations that impact physical activity. Participants will learn practical ways to adapt and manage aches and pains, and to minimize their effect on regular exercise. Patients will learn how to maintain good posture while sitting, walking, and lifting.  Balance Training and Fall Prevention  Clinical staff led group instruction and group discussion with PowerPoint presentation and patient guidebook. To enhance the learning environment the use of posters, models and videos may be added. At the conclusion of this workshop, patients will understand the importance of their sensorimotor skills (vision, proprioception, and the vestibular system) in maintaining their ability to balance as they age. Patients will apply a variety of balancing exercises that are appropriate for their current level of function. Patients will understand the common causes for poor balance, possible solutions to these problems, and ways to modify their physical environment in order to minimize their fall risk. The purpose of this lesson is to teach patients about the importance of maintaining balance as they age and ways to minimize their risk of falling.  WORKSHOPS   Nutrition:  Fueling a Scientist, research (physical sciences) led group instruction and group discussion with PowerPoint presentation and patient guidebook. To enhance the learning environment the use of posters, models and videos may be added. Patients will review the foundational principles of the [place] and understand what constitutes a serving size in each of the food groups. Patients will also learn Pritikin-friendly  foods that are better choices when away from home and review make-ahead meal and snack options. Calorie density will be reviewed and applied to three nutrition priorities: weight maintenance, weight loss, and weight gain. The purpose of this lesson is to reinforce (in a group setting) the key concepts around what patients are recommended to eat and how to apply these guidelines when away from home by planning and selecting Pritikin-friendly options. Patients will understand how calorie density may be adjusted for different weight management goals.  Mindful Eating  Clinical staff led group instruction and group discussion with PowerPoint presentation and patient guidebook. To enhance the learning environment the use of posters, models and videos may  be added. Patients will briefly review the concepts of the [place] and the importance of low-calorie dense foods. The concept of mindful eating will be introduced as well as the importance of paying attention to internal hunger signals. Triggers for non-hunger eating and techniques for dealing with triggers will be explored. The purpose of this lesson is to provide patients with the opportunity to review the basic principles of the [place], discuss the value of eating mindfully and how to measure internal cues of hunger and fullness using the Hunger Scale. Patients will also discuss reasons for non-hunger eating and learn strategies to use for controlling emotional eating.  Targeting Your Nutrition Priorities Clinical staff led group instruction and group discussion with PowerPoint presentation and patient guidebook. To enhance the learning environment the use of posters, models and videos may be added. Patients will learn how to determine their genetic susceptibility to disease by reviewing their family history. Patients will gain insight into the importance of diet as part of an overall healthy lifestyle in mitigating the impact of  genetics and other environmental insults. The purpose of this lesson is to provide patients with the opportunity to assess their personal nutrition priorities by looking at their family history, their own health history and current risk factors. Patients will also be able to discuss ways of prioritizing and modifying the [place] for their highest risk areas  Menu  Clinical staff led group instruction and group discussion with PowerPoint presentation and patient guidebook. To enhance the learning environment the use of posters, models and videos may be added. Using menus brought in from [employer], or printed from [employer], patients will apply the [place] dining out guidelines that were presented in the [employer] video. Patients will also be able to practice these guidelines in a variety of provided scenarios. The purpose of this lesson is to provide patients with the opportunity to practice hands-on learning of the [place] with actual menus and practice scenarios.  Label Reading Clinical staff led group instruction and group discussion with PowerPoint presentation and patient guidebook. To enhance the learning environment the use of posters, models and videos may be added. Patients will review and discuss the [employer] label reading guidelines presented in [employer]'s Label Reading Educational series video. Using fool labels brought in from local grocery stores and markets, patients will apply the label reading guidelines and determine if the packaged food meet the [facility] guidelines. The purpose of this lesson is to provide patients with the opportunity to review, discuss, and practice hands-on learning of the [employer] Label Reading guidelines with actual packaged food labels. [place] Workshops are designed to teach patients ways to prepare quick, simple, and affordable recipes at home. The importance of  nutrition's role in chronic disease risk reduction is reflected in its emphasis in the overall [employer] program. By learning how to prepare essential core [employer] Eating Plan recipes, patients will increase control over what they eat; be able to customize the flavor of foods without the use of added salt, sugar, or fat; and improve the quality of the food they consume. By learning a set of core recipes which are easily assembled, quickly prepared, and affordable, patients are more likely to prepare more healthy foods at home. These workshops focus on convenient breakfasts, simple entres, side dishes, and desserts which can be prepared with minimal effort and are consistent with nutrition recommendations for cardiovascular risk reduction. [place]  Workshops are taught by a Engineer, materials (RD) who has been trained by the [employer] team. The chef or RD has a clear understanding of the importance of minimizing - if not completely eliminating - added fat, sugar, and sodium in recipes. Throughout the series of [place] Workshop sessions, patients will learn about healthy ingredients and efficient methods of cooking to build confidence in their capability to prepare    Cooking School weekly topics:  Adding Flavor- Sodium-Free  Fast and Healthy Breakfasts  Powerhouse Plant-Based Proteins  Satisfying Salads and Dressings  Simple Sides and Sauces  International Cuisine-Spotlight on the [place] Zones  Delicious Desserts  Savory Soups  [employer] - Meals in a Agricultural consultant Appetizers and Snacks  Comforting Weekend Breakfasts  One-Pot Wonders   Fast Evening Meals  Contractor Your [facility] Plate  WORKSHOPS   Healthy Mindset (Psychosocial):  Focused Goals, Sustainable Changes Clinical staff led group instruction and group discussion with PowerPoint presentation and patient guidebook. To enhance the learning environment the use of posters,  models and videos may be added. Patients will be able to apply effective goal setting strategies to establish at least one personal goal, and then take consistent, meaningful action toward that goal. They will learn to identify common barriers to achieving personal goals and develop strategies to overcome them. Patients will also gain an understanding of how our mind-set can impact our ability to achieve goals and the importance of cultivating a positive and growth-oriented mind-set. The purpose of this lesson is to provide patients with a deeper understanding of how to set and achieve personal goals, as well as the tools and strategies needed to overcome common obstacles which may arise along the way.  From Head to Heart: The Power of a Healthy Outlook  Clinical staff led group instruction and group discussion with PowerPoint presentation and patient guidebook. To enhance the learning environment the use of posters, models and videos may be added. Patients will be able to recognize and describe the impact of emotions and mood on physical health. They will discover the importance of self-care and explore self-care practices which may work for them. Patients will also learn how to utilize the 4 C's to cultivate a healthier outlook and better manage stress and challenges. The purpose of this lesson is to demonstrate to patients how a healthy outlook is an essential part of maintaining good health, especially as they continue their cardiac rehab journey.  Healthy Sleep for a Healthy Heart Clinical staff led group instruction and group discussion with PowerPoint presentation and patient guidebook. To enhance the learning environment the use of posters, models and videos may be added. At the conclusion of this workshop, patients will be able to demonstrate knowledge of the importance of sleep to overall health, well-being, and quality of life. They will understand the symptoms of, and treatments for, common sleep  disorders. Patients will also be able to identify daytime and nighttime behaviors which impact sleep, and they will be able to apply these tools to help manage sleep-related challenges. The purpose of this lesson is to provide patients with a general overview of sleep and outline the importance of quality sleep. Patients will learn about a few of the most common sleep disorders. Patients will also be introduced to the concept of "sleep hygiene," and discover ways to self-manage certain sleeping problems through simple daily behavior changes. Finally, the workshop will motivate patients by clarifying the links between quality sleep and  their goals of heart-healthy living.   Recognizing and Reducing Stress Clinical staff led group instruction and group discussion with PowerPoint presentation and patient guidebook. To enhance the learning environment the use of posters, models and videos may be added. At the conclusion of this workshop, patients will be able to understand the types of stress reactions, differentiate between acute and chronic stress, and recognize the impact that chronic stress has on their health. They will also be able to apply different coping mechanisms, such as reframing negative self-talk. Patients will have the opportunity to practice a variety of stress management techniques, such as deep abdominal breathing, progressive muscle relaxation, and/or guided imagery.  The purpose of this lesson is to educate patients on the role of stress in their lives and to provide healthy techniques for coping with it.  Learning Barriers/Preferences:  Learning Barriers/Preferences - 10/16/22 1448       Learning Barriers/Preferences   Learning Barriers Sight;Exercise Concerns   balance, 12 seconds single leg stand, unsteady gait   Learning Preferences Pictoral;Video;Written Material;Skilled Demonstration;Individual Instruction;Group Instruction             Education Topics:  Knowledge  Questionnaire Score:  Knowledge Questionnaire Score - 10/16/22 1450       Knowledge Questionnaire Score   Pre Score 21/24             Core Components/Risk Factors/Patient Goals at Admission:  Personal Goals and Risk Factors at Admission - 10/16/22 1451       Core Components/Risk Factors/Patient Goals on Admission    Weight Management Yes    Intervention Weight Management: Develop a combined nutrition and exercise program designed to reach desired caloric intake, while maintaining appropriate intake of nutrient and fiber, sodium and fats, and appropriate energy expenditure required for the weight goal.;Weight Management: Provide education and appropriate resources to help participant work on and attain dietary goals.    Expected Outcomes Short Term: Continue to assess and modify interventions until short term weight is achieved;Long Term: Adherence to nutrition and physical activity/exercise program aimed toward attainment of established weight goal    Improve shortness of breath with ADL's Yes    Intervention Provide education, individualized exercise plan and daily activity instruction to help decrease symptoms of SOB with activities of daily living.    Expected Outcomes Short Term: Improve cardiorespiratory fitness to achieve a reduction of symptoms when performing ADLs;Long Term: Be able to perform more ADLs without symptoms or delay the onset of symptoms    Hypertension Yes    Intervention Provide education on lifestyle modifcations including regular physical activity/exercise, weight management, moderate sodium restriction and increased consumption of fresh fruit, vegetables, and low fat dairy, alcohol moderation, and smoking cessation.;Monitor prescription use compliance.    Expected Outcomes Short Term: Continued assessment and intervention until BP is < 140/13mm HG in hypertensive participants. < 130/77mm HG in hypertensive participants with diabetes, heart failure or chronic kidney  disease.;Long Term: Maintenance of blood pressure at goal levels.    Lipids Yes    Intervention Provide education and support for participant on nutrition & aerobic/resistive exercise along with prescribed medications to achieve LDL 70mg , HDL >40mg .    Expected Outcomes Short Term: Participant states understanding of desired cholesterol values and is compliant with medications prescribed. Participant is following exercise prescription and nutrition guidelines.;Long Term: Cholesterol controlled with medications as prescribed, with individualized exercise RX and with personalized nutrition plan. Value goals: LDL < 70mg , HDL > 40 mg.    Personal Goal Other Yes  Personal Goal Short Term: Understand limits to exercise. Long term: Strength, Stamina    Intervention Will continue to monitor pt and progress WL as tolerated    Expected Outcomes Pt will acheive goals             Core Components/Risk Factors/Patient Goals Review:   Goals and Risk Factor Review     Row Name 11/13/22 0742             Core Components/Risk Factors/Patient Goals Review   Personal Goals Review Weight Management/Obesity;Hypertension;Lipids       Review Wille Glaser is doing well with exercise at intensive cardiac rehab. Vital signs have been stable.       Expected Outcomes Joe will continue to participate in intensive cardiac rehab for exercise, nutrition and lifestyle modifications                Core Components/Risk Factors/Patient Goals at Discharge (Final Review):   Goals and Risk Factor Review - 11/13/22 0742       Core Components/Risk Factors/Patient Goals Review   Personal Goals Review Weight Management/Obesity;Hypertension;Lipids    Review Wille Glaser is doing well with exercise at intensive cardiac rehab. Vital signs have been stable.    Expected Outcomes Joe will continue to participate in intensive cardiac rehab for exercise, nutrition and lifestyle modifications             ITP Comments:  ITP Comments      Row Name 10/16/22 0931 10/21/22 1151 11/13/22 0739       ITP Comments Dr. Fransico Him medical director. Introduction to [facility]ikin education program/intensive cardiac rehab. Initial orientation packet reviewed with patient. 30 Day ITP Review. Joe is scheduled to begin cardiac rehab on 10/22/22 30 Day ITP Review. Joe has good attendance and participation in intensive cardiac rehab.              Comments: See ITP comments. Wille Glaser is doing well with exercise.Harrell Gave RN BSN

## 2022-11-14 ENCOUNTER — Encounter (HOSPITAL_COMMUNITY)
Admission: RE | Admit: 2022-11-14 | Discharge: 2022-11-14 | Disposition: A | Discharge status: Home / Self Care | Payer: PPO | Source: Ambulatory Visit | Attending: Cardiology | Admitting: Cardiology

## 2022-11-14 DIAGNOSIS — Z955 Presence of coronary angioplasty implant and graft: Secondary | ICD-10-CM | POA: Diagnosis not present

## 2022-11-14 NOTE — Progress Notes (Signed)
Cardiology Office Note:    Date:  11/25/2022   ID:  [name], DOB Mar 13, 1941, MRN [id number]  PCP:  [name], MD  Cardiologist:  Minus Breeding, MD  Electrophysiologist:  None   Referring MD: [name], MD   Chief Complaint: follow-up of chest pain  History of Present Illness:    [name] is a 82 y.o. male with a history of CAD s/p multiple PCIs (BMS to RCA in 10/2000, DES to 1st Diag and PTCA to LCX in 06/2005, and recent DES to LAD and proximal 1st Diag in 08/2022, hypertension, hyperlipidemia, CKD stage III, GERD with Barrett's esophagus, duodenal ulcer, iron deficiency anemia, and skin cancer who is followed by Dr. [name] and presents today for follow-up of chest pain.  Patient has a history of CAD with multiple PCIs as described above. ABIs in 02/2019 were normal on the right (1.19) and mildly abnormal on the left 0.98). Last Echo in 10/2020 showed LVEF of 60-65% with normal wall motion, mild LHV, and grade 2 diastolic dysfunction as well as mild MR and mild dilatation of the ascending aorta measuring 40 mm. Most recent ischemic evaluation was a LHC in 08/2022 for further evaluation of unstable angina which showed 95% stenosis of proximal LAD with 95% stenosed side branch in the 1st Diag, 55% stenosis of 1st Diag just distal to the bifurcation, 65% in-stent restenosis of distal portion of prior stent of the ramus intermedius, 40% stenosis of proximal to mid RCA followed by 55% stenosis of mid to distal RCA. He underwent successful complex bifurcation two vessel PCI with DES to LAD and DES to ostial 1st Diag at that time. Ramus intermedius lesion was treated medically with low threshold to consider PCI of this if he had recurrent anginal symptoms. Patient was last seen by Vikki Ports, PA-C, on 10/21/2022 at which time he reported one episode of chest pain during cardiac rehab during his 6 minute walk test. However, he had been exercising at home with no problems.  Imdur was decreased. BP was soft so Lisinopril was decreased to allow for this increase in Imdur.   Patient presents today for follow-up. He is doing great today. He states he feels like a new person. He denies any recurrent chest pain. No shortness of breath, orthopnea, PND, lower extremity edema, palpitations, lightheadedness, dizziness, or syncope. He is still going to Cardiac Rehab three times a week and then is going to [employer] on Tuesdays and Thursdays to lift weights. He denies any symptoms with this. He is compliant with all his medications and is tolerating them well.  Patient brings in home BP/HR log. BP well controlled ranging from 109-132/60-71 and HR ranging from 51 to 65 bpm.   Past Medical History:  Diagnosis Date   Anemia    Barrett's esophagus    Blood transfusion without reported diagnosis    CAD (coronary artery disease)    2002 stenting of the RCA,, 2006 Stenting of diag with DES, PTCA of Circ, 50% LAD   Cancer    skin cancer scalp and forehead   Chronic kidney disease    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 - decreased dose of HCTZ to M,F   Diverticulosis    DJD (degenerative joint disease)    Duodenal ulcer    ED (erectile dysfunction)    GERD (gastroesophageal reflux disease)    increased s/s in the last week 03-22-19 PV    Hemorrhoids    Hyperlipidemia  Hypertension    Myocardial infarction    2002   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   mid frontal scalp-[id number]   SCC (squamous cell carcinoma) 06/15/2019   front middle scalp-tx cx61fu   SCC (squamous cell carcinoma) 12/27/2002   CIS-right angle of jaw--[id number]   SCC (squamous cell carcinoma) 09/24/2004   CIS--mid post crown, left mis crown medial,left first web space-tx cx25fu   SCC (squamous cell carcinoma) 06/03/2006   SCCA-right temple-tx cx74fu   SCC (squamous cell carcinoma) 06/03/2006   CIS-right forehead, left mis crown scalp medial-tx cx3fu   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/30/2006   right  temple lateral-txpbx   SCC (squamous cell carcinoma) 11/10/2006   left forehead, left hand proximal   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/02/2009   left scalp-[id number]   SCC (squamous cell carcinoma) 01/24/2015   left scalp-[id number]   SCC (squamous cell carcinoma) 11/12/2015   front lateralscalp-cx63fu, medial scalp-cx79fu   SCC (squamous cell carcinoma) 12/17/2016   front center scalp-[id number]   Squamous cell carcinoma of skin 08/10/2017   CIS--right ear rim, top of scalp, left side scalp-tx-cx34fu    Past Surgical History:  Procedure Laterality Date   CARDIAC CATHETERIZATION     COLONOSCOPY     CORONARY STENT INTERVENTION N/A 09/03/2022   Procedure: CORONARY STENT INTERVENTION;  Surgeon: [name], MD;  Location: [facility];  Service: Cardiovascular;  Laterality: N/A;   ELBOW ARTHROSCOPY     heart stents     2002,2004   LEFT HEART CATH AND CORONARY ANGIOGRAPHY N/A 09/03/2022   Procedure: LEFT HEART CATH AND CORONARY ANGIOGRAPHY;  Surgeon: [name], MD;  Location: [facility];  Service: Cardiovascular;  Laterality: N/A;   SPINAL FUSION     SPINAL FUSION     SPINE SURGERY     cervical   UPPER GASTROINTESTINAL ENDOSCOPY      Current Medications: Current Meds  Medication Sig   amLODipine (NORVASC) 10 MG tablet TAKE ONE TABLET BY MOUTH EVERYDAY AT BEDTIME   aspirin EC 81 MG tablet Take 81 mg by mouth daily. Swallow whole.   atenolol (TENORMIN) 50 MG tablet Take 50 mg by mouth 2 (two) times daily.   atorvastatin (LIPITOR) 40 MG tablet Take 1 tablet (40 mg total) by mouth at bedtime.   clopidogrel (PLAVIX) 75 MG tablet Take 1 tablet (75 mg total) by mouth daily with breakfast.   Coenzyme Q10 (CO Q 10 PO) Take 1 tablet by mouth daily.   ferrous sulfate 325 (65 FE) MG tablet Take 1 tablet (325 mg total) by mouth daily with breakfast.   hydrALAZINE (APRESOLINE) 25 MG tablet Take 1 tablet (25 mg total) by mouth 3 (three) times daily.   isosorbide mononitrate  (IMDUR) 60 MG 24 hr tablet TAKE 1 TABLET BY MOUTH EVERY DAY   KRILL OIL PO Take 100 mg by mouth daily. Mega Red   lidocaine (HM LIDOCAINE PATCH) 4 % Place 1 patch onto the skin as needed.   lisinopril (ZESTRIL) 10 MG tablet Take 1 tablet (10 mg total) by mouth daily. TAKE ONE TABLET BY MOUTH EVERYDAY AT BEDTIME   Multiple Vitamins-Minerals (CENTRUM SILVER 50+MEN PO) Take 1 tablet by mouth.   Multiple Vitamins-Minerals (PRESERVISION AREDS 2) CAPS Take 1 capsule by mouth 2 (two) times daily.   nitroGLYCERIN (NITROSTAT) 0.4 MG SL tablet DISSOLVE 1 TABLET UNDER THE TONGUE EVERY 5 MINUTES AS NEEDED FOR CHEST PAIN. DO NOT EXCEED A TOTAL OF 3 DOSES IN 15 MINUTES.  pantoprazole (PROTONIX) 40 MG tablet TAKE ONE TABLET BY MOUTH EVERY MORNING   Polyethyl Glycol-Propyl Glycol (SYSTANE) 0.4-0.3 % SOLN Place 2 drops into both eyes every morning.   primidone (MYSOLINE) 50 MG tablet TAKE ONE TABLET BY MOUTH EVERY MORNING and TAKE ONE TABLET BY MOUTH EVERYDAY AT BEDTIME     Allergies:   Patient has no known allergies.   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   Occupation: retired  Tobacco Use   Smoking status: Former    Types: Cigarettes    Quit date: 08/25/1961    Years since quitting: 61.2    Passive exposure: Never   Smokeless tobacco: Never  Vaping Use   Vaping Use: Never used  Substance and Sexual Activity   Alcohol use: Yes    Comment: very seldom   Drug use: No   Sexual activity: Not Currently  Other Topics Concern   Not on file  Social History Narrative   Right handed   1 Story home    live spouse   Social Determinants of Health   Financial Resource Strain: Low Risk  (03/21/2022)   Overall Financial Resource Strain (CARDIA)    Difficulty of  Paying Living Expenses: Not hard at all  Food Insecurity: No Food Insecurity (09/03/2022)   Hunger Vital Sign    Worried About Running Out of Food in the Last Year: Never true    [place] in the Last Year: Never true  Transportation Needs: No Transportation Needs (09/03/2022)   PRAPARE - Hydrologist (Medical): No    Lack of Transportation (Non-Medical): No  Physical Activity: Sufficiently Active (03/21/2022)   Exercise Vital Sign    Days of Exercise per Week: 5 days    Minutes of Exercise per Session: 30 min  Stress: No Stress Concern Present (03/21/2022)   [place]    Feeling of Stress : Not at all  Social Connections: Unknown (03/21/2022)   Social Connection and Isolation Panel [NHANES]    Frequency of Communication with Friends and Family: More than three times a week    Frequency of Social Gatherings with Friends and Family: More than three times a week    Attends Religious Services: Not on Advertising copywriter or Organizations: Yes    Attends Music therapist: More than 4 times per year    Marital Status: Married     Family History: The patient's family history includes Alcohol abuse in an other family member; Alzheimer's disease in an other family member; Coronary artery disease in an other family member; Dementia in his mother. There is no history of Colon cancer, Esophageal cancer, Rectal cancer, Stomach cancer, or Colon polyps.  ROS:   Please see the history of present illness.     EKGs/Labs/Other Studies Reviewed:    The following studies were reviewed:  Echocardiogram 11/06/2020: Impressions: 1. Left ventricular ejection fraction, by estimation, is 60 to 65%. The  left ventricle has normal function. The left ventricle has no regional  wall motion abnormalities. There is mild left ventricular hypertrophy.  Left ventricular diastolic parameters   are consistent with  Grade II diastolic dysfunction (pseudonormalization).  The average left ventricular global longitudinal strain is -22.8 %. The  global longitudinal strain is normal.   2. Right ventricular systolic function is normal. The right ventricular  size is normal. There is normal pulmonary artery systolic pressure.   3. Left atrial size was moderately dilated.   4. The mitral valve is normal in structure. Mild mitral valve  regurgitation. No evidence of mitral stenosis.   5. The aortic valve is tricuspid. Aortic valve regurgitation is mild. No  aortic stenosis is present.   6. Aortic dilatation noted. There is mild dilatation of the ascending  aorta, measuring 40 mm.   7. The inferior vena cava is normal in size with greater than 50%  respiratory variability, suggesting right atrial pressure of 3 mmHg.  _______________  Left Cardiac Catheterization 09/03/2022:   Prox LAD lesion is 95% stenosed with 95% stenosed side branch in 1st Diag. 1st Diag lesion is 55% stenosed distal to the bifurcation lesion.   A drug-eluting stent was successfully placed in the ostial and proximal  1st Diag (D1) using a SYNERGY XD 2.25X12.   -- Post intervention, the side branch was reduced to 0% residual stenosis.  (Covering the ostial lesion as well as the the 55% stenosis)   A drug-eluting stent was successfully placed in the proximal LAD from just beyond ramus intermedius to PRN major septal perforator distally.  Crosses to diagonal branches as noted.  Using a SYNERGY XD 2.50X28.  Deployed to 2.7 mm, postdilated proximally to 2.8 mm.   -- Post intervention, there is a 0% residual stenosis in the LAD.  --------------------------------------------   Ramus lesion is 65% stenosed -> distal in-stent restenosis, DES stent   Prox RCA to Mid RCA lesion is 40% stenosed.  Mid RCA bare-metal stent is 5% stenosed. Mid RCA to Dist RCA lesion is 55% stenosed.   1st Diag lesion is 55%  stenosed. ----------------------   The left ventricular systolic function is normal.  LV end diastolic pressure is normal.  There is no aortic valve stenosis.   Post-Op Diagnosis: CULPRIT LESION: Severe bifurcation LAD-D1 ([place] 1, 1, 1) 95% stenosis beginning just distal to Ramus Intermedius (RI) Successful complex bifurcation two-vessel PCI with Synergy 2.25 x 12 DES ostial proximal D1 and Synergy 2.5 mm x 28 mm deployed to 2.7 mm, postdilated proximally to 2.8 mm (unable to rewire the jailed D1 stent and successfully advance balloon across.  However there is TIMI-3 flow and appears to be widely patent.  I do not push too aggressively.. Patent proximal stent in the Ri with distal stent ISR roughly 65%. After discussion with patient's PCP, based on the prolonged bifurcation PCI, we plan to treat the stenosis medically and bring him back to the Cath Lab if symptoms warrant. Widely patent RCA with focal 40% stenosis proximal to an 55% stenosis distal to the previous stent Apparently normal LVEF with mild to moderately elevated LVEDP.   PLAN OF CARE: Admit for overnight observation; DAPT uninterrupted for minimum 6 months, would continue Plavix long-term -Low threshold to consider PTCA/DES PCI of RI lesion.  Diagnostic Dominance: Right  Intervention         EKG:  EKG not ordered today. .  Recent Labs: 09/08/2022: BUN 24; Creatinine, Ser 1.22; Potassium 4.1; Sodium 139 09/18/2022: Hemoglobin 12.1; Platelets 312.0  Recent Lipid Panel    Component Value Date/Time   CHOL 123 12/26/2021 0911   TRIG 119.0 12/26/2021 0911   TRIG 161 (H) 08/28/2006  1023   HDL 48.40 12/26/2021 0911   CHOLHDL 3 12/26/2021 0911   VLDL 23.8 12/26/2021 0911   LDLCALC 51 12/26/2021 0911   LDLCALC 60 03/12/2020 1144    Physical Exam:    Vital Signs: BP 118/86   Pulse 66   Ht 5\' 6"  (1.676 m)   Wt 170 lb 6.4 oz (77.3 kg)   SpO2 98%   BMI 27.50 kg/m     Wt Readings from Last 3 Encounters:  11/25/22  170 lb 6.4 oz (77.3 kg)  10/21/22 170 lb (77.1 kg)  10/16/22 173 lb 8 oz (78.7 kg)     General: 82 y.o. Caucasian male in no acute distress. HEENT: Normocephalic and atraumatic. Sclera clear. EOMs intact. Neck: Supple. No carotid bruits. No JVD. Heart: RRR. Distinct S1 and S2. No murmurs, gallops, or rubs. Radial  pulses 2+ and equal bilaterally. Lungs: No increased work of breathing. Clear to ausculation bilaterally. No wheezes, rhonchi, or rales.  Abdomen: Soft, non-distended, and non-tender to palpation. Extremities: No lower extremity edema.    Skin: Warm and dry. Neuro: Alert and oriented x3. No focal deficits. Psych: Normal affect. Responds appropriately.  Assessment:    1. Coronary artery disease involving native coronary artery of native heart without angina pectoris   2. Primary hypertension   3. Hyperlipidemia, unspecified hyperlipidemia type   4. Iron deficiency anemia, unspecified iron deficiency anemia type     Plan:    CAD History of multiple PCIs - BMS to RCA in 10/2000, DES to 1st Diag and PTCA to LCX in 06/2005, and most recently a complex bifurcation two vessel PCI with DES to LAD and DES to ostial 1st Diag in 08/2022. Imdur was increased at visit in 09/2022 due to one episode of chest pain while working with cardiac rehab. - Doing great. No recurrent chest pain.  - Continue antianginals: Amlodipine 10mg  daily, Imdur 60mg  daily, and Atenolol 50mg  twice daily.  - Continue DAPT with Aspirin and Plavix.  - Continue high-intensity statin.  Hypertension BP well controlled.  - Continue current medications: Amlodipine 10mg  daily, Imdur 60mg  daily, Atenolol 50mg  twice daily, Lisinopril 10mg  daily, and Hydralazine 25mg  three times daily.  Hyperlipidemia Lipid panel in 12/2021: Total Cholesterol 123, Triglycerides 119, HDL 48.40, LDL 51.  - Continue Lipitor 40mg  daily.  - Will repeat lipid panel and CMET today.  Iron Deficiency Anemia Patient states his RBCs and  hemoglobin have been low in the past. Upon chart review, looks like he has iron deficiency anemia. Hemoglobin 12.1 in 08/2022. Iron normal but Ferritin low at 17.6 at that time. He is taking an iron supplement.  - Will recheck CBC today per patient's request since we are already getting other labs.  Disposition: Patient already has a follow-up visit with Dr. [name] scheduled for 01/05/2023. Will keep this.   Medication Adjustments/Labs and Tests Ordered: Current medicines are reviewed at length with the patient today.  Concerns regarding medicines are outlined above.  Orders Placed This Encounter  Procedures   Lipid panel   CBC   Comprehensive metabolic panel   No orders of the defined types were placed in this encounter.   Patient Instructions  Medication Instructions:   Your physician recommends that you continue on your current medications as directed. Please refer to the Current Medication list given to you today.  *If you need a refill on your cardiac medications before your next appointment, please call your pharmacy*  Lab Work: Your physician recommends that have lab work  TODAY:  CMP CBC FLP  If you have labs (blood work) drawn today and your tests are completely normal, you will receive your results only by: MyChart Message (if you have MyChart) OR A paper copy in the mail If you have any lab test that is abnormal or we need to change your treatment, we will call you to review the results.  Testing/Procedures: NONE ordered at this time of appointment   Follow-Up: At [facility], you and your health needs are our priority.  As part of our continuing mission to provide you with exceptional heart care, we have created designated Provider Care Teams.  These Care Teams include your primary Cardiologist (physician) and Advanced Practice Providers (APPs -  Physician Assistants and Nurse Practitioners) who all work together to provide you with the care you need, when  you need it.   Your next appointment:   As previously scheduled   Provider:   Minus Breeding, MD     Other Instructions     Signed, Darreld Mclean, PA-C  11/25/2022 8:39 AM    [place]

## 2022-11-14 NOTE — Progress Notes (Signed)
CARDIAC REHAB PHASE 2  Reviewed home exercise with pt today. Pt is tolerating exercise well. Pt will continue to exercise on his own by walking and going to [employer] for 30-45 minutes per session 2-3 days a week in addition to the 3 days in CRP2. Advised pt on THRR, RPE scale, hydration and temperature/humidity precautions. Reinforced NTG use, S/S to stop exercise and when to call MD vs 911. Encouraged warm up cool down and stretches with exercise sessions. Pt verbalized understanding, all questions were answered and pt was given a copy to take home.    Kirby Funk ACSM-CEP 11/14/2022 5:15 PM

## 2022-11-17 ENCOUNTER — Encounter (HOSPITAL_COMMUNITY)
Admission: RE | Admit: 2022-11-17 | Discharge: 2022-11-17 | Disposition: A | Discharge status: Home / Self Care | Payer: PPO | Source: Ambulatory Visit | Attending: Cardiology | Admitting: Cardiology

## 2022-11-17 DIAGNOSIS — Z955 Presence of coronary angioplasty implant and graft: Secondary | ICD-10-CM

## 2022-11-18 DIAGNOSIS — L570 Actinic keratosis: Secondary | ICD-10-CM | POA: Diagnosis not present

## 2022-11-19 ENCOUNTER — Encounter (HOSPITAL_COMMUNITY)
Admission: RE | Admit: 2022-11-19 | Discharge: 2022-11-19 | Disposition: A | Discharge status: Home / Self Care | Payer: PPO | Source: Ambulatory Visit | Attending: Cardiology | Admitting: Cardiology

## 2022-11-19 DIAGNOSIS — Z955 Presence of coronary angioplasty implant and graft: Secondary | ICD-10-CM

## 2022-11-19 DIAGNOSIS — R079 Chest pain, unspecified: Secondary | ICD-10-CM

## 2022-11-21 ENCOUNTER — Encounter (HOSPITAL_COMMUNITY)
Admission: RE | Admit: 2022-11-21 | Discharge: 2022-11-21 | Disposition: A | Discharge status: Home / Self Care | Payer: PPO | Source: Ambulatory Visit | Attending: Cardiology | Admitting: Cardiology

## 2022-11-21 DIAGNOSIS — Z955 Presence of coronary angioplasty implant and graft: Secondary | ICD-10-CM

## 2022-11-24 ENCOUNTER — Encounter (HOSPITAL_COMMUNITY)
Admission: RE | Admit: 2022-11-24 | Discharge: 2022-11-24 | Disposition: A | Discharge status: Home / Self Care | Payer: PPO | Source: Ambulatory Visit | Attending: Cardiology | Admitting: Cardiology

## 2022-11-24 DIAGNOSIS — Z955 Presence of coronary angioplasty implant and graft: Secondary | ICD-10-CM | POA: Diagnosis not present

## 2022-11-24 DIAGNOSIS — Z48812 Encounter for surgical aftercare following surgery on the circulatory system: Secondary | ICD-10-CM | POA: Insufficient documentation

## 2022-11-24 DIAGNOSIS — R079 Chest pain, unspecified: Secondary | ICD-10-CM | POA: Insufficient documentation

## 2022-11-25 ENCOUNTER — Ambulatory Visit: Payer: PPO | Attending: Student | Admitting: Student

## 2022-11-25 ENCOUNTER — Encounter: Payer: Self-pay | Admitting: Student

## 2022-11-25 VITALS — BP 118/86 | HR 66 | Ht 66.0 in | Wt 170.4 lb

## 2022-11-25 DIAGNOSIS — I10 Essential (primary) hypertension: Secondary | ICD-10-CM | POA: Diagnosis not present

## 2022-11-25 DIAGNOSIS — I2510 Atherosclerotic heart disease of native coronary artery without angina pectoris: Secondary | ICD-10-CM | POA: Diagnosis not present

## 2022-11-25 DIAGNOSIS — D509 Iron deficiency anemia, unspecified: Secondary | ICD-10-CM

## 2022-11-25 DIAGNOSIS — E785 Hyperlipidemia, unspecified: Secondary | ICD-10-CM | POA: Diagnosis not present

## 2022-11-25 NOTE — Patient Instructions (Signed)
Medication Instructions:   Your physician recommends that you continue on your current medications as directed. Please refer to the Current Medication list given to you today.  *If you need a refill on your cardiac medications before your next appointment, please call your pharmacy*  Lab Work: Your physician recommends that have lab work TODAY:  CMP CBC FLP  If you have labs (blood work) drawn today and your tests are completely normal, you will receive your results only by: [place] (if you have MyChart) OR A paper copy in the mail If you have any lab test that is abnormal or we need to change your treatment, we will call you to review the results.  Testing/Procedures: NONE ordered at this time of appointment   Follow-Up: At [facility], you and your health needs are our priority.  As part of our continuing mission to provide you with exceptional heart care, we have created designated Provider Care Teams.  These Care Teams include your primary Cardiologist (physician) and Advanced Practice Providers (APPs -  Physician Assistants and Nurse Practitioners) who all work together to provide you with the care you need, when you need it.   Your next appointment:   As previously scheduled   Provider:   Minus Breeding, MD     Other Instructions

## 2022-11-26 ENCOUNTER — Encounter (HOSPITAL_COMMUNITY)
Admission: RE | Admit: 2022-11-26 | Discharge: 2022-11-26 | Disposition: A | Discharge status: Home / Self Care | Payer: PPO | Source: Ambulatory Visit | Attending: Cardiology | Admitting: Cardiology

## 2022-11-26 DIAGNOSIS — Z955 Presence of coronary angioplasty implant and graft: Secondary | ICD-10-CM | POA: Diagnosis not present

## 2022-11-26 LAB — COMPREHENSIVE METABOLIC PANEL
ALT: 21 IU/L (ref 0–44)
AST: 22 IU/L (ref 0–40)
Albumin/Globulin Ratio: 1.8 (ref 1.2–2.2)
Albumin: 4.5 g/dL (ref 3.7–4.7)
Alkaline Phosphatase: 100 IU/L (ref 44–121)
BUN/Creatinine Ratio: 20 (ref 10–24)
BUN: 22 mg/dL (ref 8–27)
Bilirubin Total: 0.3 mg/dL (ref 0.0–1.2)
CO2: 24 mmol/L (ref 20–29)
Calcium: 10.3 mg/dL — ABNORMAL HIGH (ref 8.6–10.2)
Chloride: 103 mmol/L (ref 96–106)
Creatinine, Ser: 1.09 mg/dL (ref 0.76–1.27)
Globulin, Total: 2.5 g/dL (ref 1.5–4.5)
Glucose: 96 mg/dL (ref 70–99)
Potassium: 5.4 mmol/L — ABNORMAL HIGH (ref 3.5–5.2)
Sodium: 142 mmol/L (ref 134–144)
Total Protein: 7 g/dL (ref 6.0–8.5)
eGFR: 67 mL/min/{1.73_m2} (ref >59)

## 2022-11-26 LAB — CBC
Hematocrit: 44 % (ref 37.5–51.0)
Hemoglobin: 14.2 g/dL (ref 13.0–17.7)
MCH: 31.4 pg (ref 26.6–33.0)
MCHC: 32.3 g/dL (ref 31.5–35.7)
MCV: 97 fL (ref 79–97)
Platelets: 249 10*3/uL (ref 150–450)
RBC: 4.52 x10E6/uL (ref 4.14–5.80)
RDW: 13.1 % (ref 11.6–15.4)
WBC: 8.8 10*3/uL (ref 3.4–10.8)

## 2022-11-26 LAB — LIPID PANEL
Chol/HDL Ratio: 2.8 ratio (ref 0.0–5.0)
Cholesterol, Total: 132 mg/dL (ref 100–199)
HDL: 47 mg/dL (ref >39)
LDL Chol Calc (NIH): 65 mg/dL (ref 0–99)
Triglycerides: 108 mg/dL (ref 0–149)
VLDL Cholesterol Cal: 20 mg/dL (ref 5–40)

## 2022-11-28 ENCOUNTER — Encounter (HOSPITAL_COMMUNITY)
Admission: RE | Admit: 2022-11-28 | Discharge: 2022-11-28 | Disposition: A | Discharge status: Home / Self Care | Payer: PPO | Source: Ambulatory Visit | Attending: Cardiology | Admitting: Cardiology

## 2022-11-28 DIAGNOSIS — R079 Chest pain, unspecified: Secondary | ICD-10-CM

## 2022-11-28 DIAGNOSIS — Z955 Presence of coronary angioplasty implant and graft: Secondary | ICD-10-CM

## 2022-12-01 ENCOUNTER — Encounter (HOSPITAL_COMMUNITY)
Admission: RE | Admit: 2022-12-01 | Discharge: 2022-12-01 | Disposition: A | Discharge status: Home / Self Care | Payer: PPO | Source: Ambulatory Visit | Attending: Cardiology | Admitting: Cardiology

## 2022-12-01 DIAGNOSIS — Z955 Presence of coronary angioplasty implant and graft: Secondary | ICD-10-CM | POA: Diagnosis not present

## 2022-12-01 DIAGNOSIS — R079 Chest pain, unspecified: Secondary | ICD-10-CM

## 2022-12-03 ENCOUNTER — Other Ambulatory Visit: Payer: Self-pay | Admitting: Internal Medicine

## 2022-12-03 ENCOUNTER — Encounter (HOSPITAL_COMMUNITY)
Admission: RE | Admit: 2022-12-03 | Discharge: 2022-12-03 | Disposition: A | Discharge status: Home / Self Care | Payer: PPO | Source: Ambulatory Visit | Attending: Cardiology | Admitting: Cardiology

## 2022-12-03 DIAGNOSIS — Z955 Presence of coronary angioplasty implant and graft: Secondary | ICD-10-CM | POA: Diagnosis not present

## 2022-12-03 DIAGNOSIS — G252 Other specified forms of tremor: Secondary | ICD-10-CM

## 2022-12-03 DIAGNOSIS — K219 Gastro-esophageal reflux disease without esophagitis: Secondary | ICD-10-CM

## 2022-12-03 DIAGNOSIS — K2270 Barrett's esophagus without dysplasia: Secondary | ICD-10-CM

## 2022-12-05 ENCOUNTER — Encounter (HOSPITAL_COMMUNITY)
Admission: RE | Admit: 2022-12-05 | Discharge: 2022-12-05 | Disposition: A | Discharge status: Home / Self Care | Payer: PPO | Source: Ambulatory Visit | Attending: Cardiology | Admitting: Cardiology

## 2022-12-05 DIAGNOSIS — Z955 Presence of coronary angioplasty implant and graft: Secondary | ICD-10-CM

## 2022-12-05 NOTE — Progress Notes (Signed)
Cardiac Individual Treatment Plan  Patient Details  Name: [name] MRN: [id number] Date of Birth: Jan 17, 1941 Referring Provider:   Flowsheet Row INTENSIVE CARDIAC REHAB ORIENT from 10/16/2022 in [facility]  Referring Provider Dr. Rollene Rotunda, MD       Initial Encounter Date:  Flowsheet Row INTENSIVE CARDIAC REHAB ORIENT from 10/16/2022 in [facility]  Date 10/16/22       Visit Diagnosis: 09/03/22 S/P DES Ostial and Proximal LAD  Patient's Home Medications on Admission:  Current Outpatient Medications:    amLODipine (NORVASC) 10 MG tablet, TAKE ONE TABLET BY MOUTH EVERYDAY AT BEDTIME, Disp: 90 tablet, Rfl: 3   aspirin EC 81 MG tablet, Take 81 mg by mouth daily. Swallow whole., Disp: , Rfl:    atenolol (TENORMIN) 50 MG tablet, Take 50 mg by mouth 2 (two) times daily., Disp: , Rfl:    atorvastatin (LIPITOR) 40 MG tablet, Take 1 tablet (40 mg total) by mouth at bedtime., Disp: 90 tablet, Rfl: 3   clopidogrel (PLAVIX) 75 MG tablet, Take 1 tablet (75 mg total) by mouth daily with breakfast., Disp: 90 tablet, Rfl: 2   Coenzyme Q10 (CO Q 10 PO), Take 1 tablet by mouth daily., Disp: , Rfl:    ferrous sulfate 325 (65 FE) MG tablet, Take 1 tablet (325 mg total) by mouth daily with breakfast., Disp: 90 tablet, Rfl: 1   hydrALAZINE (APRESOLINE) 25 MG tablet, Take 1 tablet (25 mg total) by mouth 3 (three) times daily., Disp: 180 tablet, Rfl: 0   isosorbide mononitrate (IMDUR) 60 MG 24 hr tablet, TAKE 1 TABLET BY MOUTH EVERY DAY, Disp: 90 tablet, Rfl: 1   KRILL OIL PO, Take 100 mg by mouth daily. Mega Red, Disp: , Rfl:    lidocaine (HM LIDOCAINE PATCH) 4 %, Place 1 patch onto the skin as needed., Disp: , Rfl:    lisinopril (ZESTRIL) 10 MG tablet, Take 1 tablet (10 mg total) by mouth daily. TAKE ONE TABLET BY MOUTH EVERYDAY AT BEDTIME, Disp: 60 tablet, Rfl: 0   Multiple  Vitamins-Minerals (CENTRUM SILVER 50+MEN PO), Take 1 tablet by mouth., Disp: , Rfl:    Multiple Vitamins-Minerals (PRESERVISION AREDS 2) CAPS, Take 1 capsule by mouth 2 (two) times daily., Disp: , Rfl:    nitroGLYCERIN (NITROSTAT) 0.4 MG SL tablet, DISSOLVE 1 TABLET UNDER THE TONGUE EVERY 5 MINUTES AS NEEDED FOR CHEST PAIN. DO NOT EXCEED A TOTAL OF 3 DOSES IN 15 MINUTES., Disp: 25 tablet, Rfl: 2   pantoprazole (PROTONIX) 40 MG tablet, TAKE ONE TABLET BY MOUTH EVERY MORNING, Disp: 90 tablet, Rfl: 0   Polyethyl Glycol-Propyl Glycol (SYSTANE) 0.4-0.3 % SOLN, Place 2 drops into both eyes every morning., Disp: , Rfl:    primidone (MYSOLINE) 50 MG tablet, TAKE ONE TABLET BY MOUTH EVERY MORNING and TAKE ONE TABLET BY MOUTH EVERYDAY AT BEDTIME, Disp: 180 tablet, Rfl: 0  Past Medical History: Past Medical History:  Diagnosis Date   Anemia    Barrett's esophagus    Blood transfusion without reported diagnosis    CAD (coronary artery disease)    2002 stenting of the RCA,, 2006 Stenting of diag with DES, PTCA of Circ, 50% LAD   Cancer    skin cancer scalp and forehead   Chronic kidney disease    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 - decreased dose of HCTZ to M,F   Diverticulosis  DJD (degenerative joint disease)    Duodenal ulcer    ED (erectile dysfunction)    GERD (gastroesophageal reflux disease)    increased s/s in the last week 03-22-19 PV    Hemorrhoids    Hyperlipidemia    Hypertension    Myocardial infarction    2002   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   mid frontal scalp-[id number]   SCC (squamous cell carcinoma) 06/15/2019   front middle scalp-tx [id number]   SCC (squamous cell carcinoma) 12/27/2002   CIS-right angle of jaw--[id number]   SCC (squamous cell carcinoma) 09/24/2004   CIS--mid post crown, left mis crown medial,left first web space-tx cx16fu   SCC (squamous cell carcinoma) 06/03/2006   SCCA-right temple-tx cx96fu   SCC (squamous cell carcinoma) 06/03/2006   CIS-right forehead,  left mis crown scalp medial-tx cx61fu   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/30/2006   right temple lateral-txpbx   SCC (squamous cell carcinoma) 11/10/2006   left forehead, left hand proximal   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/02/2009   left scalp-[id number]   SCC (squamous cell carcinoma) 01/24/2015   left scalp-[id number]   SCC (squamous cell carcinoma) 11/12/2015   front lateralscalp-cx62fu, medial scalp-[id number]   SCC (squamous cell carcinoma) 12/17/2016   front center scalp-[id number]   Squamous cell carcinoma of skin 08/10/2017   CIS--right ear rim, top of scalp, left side scalp-tx-cx52fu    Tobacco Use: Social History   Tobacco Use  Smoking Status Former   Types: Cigarettes   Quit date: 08/25/1961   Years since quitting: 61.3   Passive exposure: Never  Smokeless Tobacco Never    Labs: Review Flowsheet  More data exists      Latest Ref Rng & Units 03/10/2019 03/12/2020 03/14/2021 12/26/2021 11/25/2022  Labs for ITP Cardiac and Pulmonary Rehab  Cholestrol 100 - 199 mg/dL 161  096  045  409  811   LDL (calc) 0 - 99 mg/dL 64  60  55  51  65   HDL-C >39 mg/dL 91.47  44  82.95  62.13  47   Trlycerides 0 - 149 mg/dL 086.5  784  696.2  952.8  108     Capillary Blood Glucose: No results found for: "GLUCAP"   Exercise Target Goals: Exercise Program Goal: Individual exercise prescription set using results from initial 6 min walk test and THRR while considering  patient's activity barriers and safety.   Exercise Prescription Goal: Initial exercise prescription builds to 30-45 minutes a day of aerobic activity, 2-3 days per week.  Home exercise guidelines will be given to patient during program as part of exercise prescription that the participant will acknowledge.  Activity Barriers & Risk Stratification:  Activity Barriers & Cardiac Risk Stratification - 10/16/22 1438       Activity Barriers & Cardiac Risk Stratification   Activity Barriers Arthritis;Deconditioning;Shortness of  Breath;Joint Problems;Neck/Spine Problems;Back Problems;Balance Concerns    Cardiac Risk Stratification High   Under 5 Mets            6 Minute Walk:  6 Minute Walk     Row Name 10/16/22 1435         6 Minute Walk   Phase Initial     Distance 1040 feet     Walk Time 6 minutes     # of Rest Breaks 0     MPH 1.97     METS 1.53     RPE 12     Perceived Dyspnea  1  VO2 Peak 5.35     Symptoms Yes (comment)     Comments Chest fullness at end of walk test-resolved with rest     Resting HR 55 bpm     Resting BP 132/62     Resting Oxygen Saturation  96 %     Exercise Oxygen Saturation  during 6 min walk 96 %     Max Ex. HR 87 bpm     Max Ex. BP 144/72     2 Minute Post BP 120/68              Oxygen Initial Assessment:   Oxygen Re-Evaluation:   Oxygen Discharge (Final Oxygen Re-Evaluation):   Initial Exercise Prescription:  Initial Exercise Prescription - 10/16/22 1400       Date of Initial Exercise RX and Referring Provider   Date 10/16/22    Referring Provider Dr. Rollene Rotunda, MD    Expected Discharge Date 12/26/22      Arm Ergometer   Level 1    RPM 40    Minutes 15    METs 1.4      Track   Laps 15    Minutes 15    METs 1.6      Prescription Details   Frequency (times per week) 3    Duration Progress to 30 minutes of continuous aerobic without signs/symptoms of physical distress      Intensity   THRR 40-80% of Max Heartrate 54-109    Ratings of Perceived Exertion 11-13    Perceived Dyspnea 0-4      Progression   Progression Continue progressive overload as per policy without signs/symptoms or physical distress.      Resistance Training   Training Prescription Yes    Weight 3    Reps 10-15             Perform Capillary Blood Glucose checks as needed.  Exercise Prescription Changes:   Exercise Prescription Changes     Row Name 10/22/22 1640 11/12/22 1628 11/14/22 1710 12/01/22 1630       Response to Exercise    Blood Pressure (Admit) 122/72 120/76 130/70 128/64    Blood Pressure (Exercise) 132/70 132/74 136/70 148/78    Blood Pressure (Exit) 99/61 102/68 110/60 102/62    Heart Rate (Admit) 53 bpm 51 bpm 55 bpm 48 bpm    Heart Rate (Exercise) 78 bpm 90 bpm 92 bpm 103 bpm    Heart Rate (Exit) 57 bpm 60 bpm 60 bpm 57 bpm    Rating of Perceived Exertion (Exercise) 12 11.5 12 12     Perceived Dyspnea (Exercise) 0 0 0 0    Symptoms none none none none    Comments Pt first day in teh [facility] program Reviewed METs and goals Reviewed Home ExRx Reviewed MET's    Duration Progress to 30 minutes of  aerobic without signs/symptoms of physical distress Progress to 30 minutes of  aerobic without signs/symptoms of physical distress Progress to 30 minutes of  aerobic without signs/symptoms of physical distress Progress to 30 minutes of  aerobic without signs/symptoms of physical distress    Intensity THRR unchanged THRR unchanged THRR unchanged THRR unchanged      Progression   Progression Continue to progress workloads to maintain intensity without signs/symptoms of physical distress. Continue to progress workloads to maintain intensity without signs/symptoms of physical distress. Continue to progress workloads to maintain intensity without signs/symptoms of physical distress. Continue to progress workloads to maintain intensity without signs/symptoms  of physical distress.    Average METs 2.62 3.22 3.22 3.57      Resistance Training   Training Prescription No No Yes Yes    Weight -- -- 3 lbs wts 4 lbs    Reps -- -- 10-15 10-15    Time -- -- 10 Minutes 10 Minutes      Arm Ergometer   Level 1 2 2  2.5    Watts 12 20 -- 22    RPM 52 67 -- --    Minutes 15 15 15 15     METs 2.2 3 2.8 3.7      Track   Laps 16 19 19 19     Minutes 15 15 15 15     METs 3.04 3.43 3.43 3.43      Home Exercise Plan   Plans to continue exercise at -- -- [employer] (comment) [employer] (comment)    Frequency -- --  Add 3 additional days to program exercise sessions. Add 3 additional days to program exercise sessions.    Initial Home Exercises Provided -- -- 11/14/22 11/14/22             Exercise Comments:   Exercise Comments     Row Name 10/22/22 1651 11/12/22 1637 11/14/22 1712 12/01/22 1630     Exercise Comments Pt first day in the [facility] program. Pt tolerated exercise well with an average MET level of 2.62. Pt is learning his THRR, RPE and ExRx. Off to a great start Reviewed MET's and goals. Pt tolerated exercise well with an average MET level of 3.22. Pt feels good about his goals of gaining strenght and stamina. He is progressing well and feels good. Will review home Exercise soon Reviewed Home ExRx today. Pt tolerated exercise well with an average MET level of 3.12. Pt will continue to exercise by walking and going to [employer] 2-3 days a week for 30-45 mins per session Reviewed MET's with patient today. Pt tolerated exercise well with an average MET level of 3.57. Pt is feeling good about the progress he is making             Exercise Goals and Review:   Exercise Goals     Row Name 10/16/22 1445             Exercise Goals   Increase Physical Activity Yes       Intervention Provide advice, education, support and counseling about physical activity/exercise needs.;Develop an individualized exercise prescription for aerobic and resistive training based on initial evaluation findings, risk stratification, comorbidities and participant's personal goals.       Expected Outcomes Short Term: Attend rehab on a regular basis to increase amount of physical activity.;Long Term: Exercising regularly at least 3-5 days a week.;Long Term: Add in home exercise to make exercise part of routine and to increase amount of physical activity.       Increase Strength and Stamina Yes       Intervention Provide advice, education, support and counseling about physical activity/exercise needs.;Develop an  individualized exercise prescription for aerobic and resistive training based on initial evaluation findings, risk stratification, comorbidities and participant's personal goals.       Expected Outcomes Short Term: Increase workloads from initial exercise prescription for resistance, speed, and METs.;Short Term: Perform resistance training exercises routinely during rehab and add in resistance training at home;Long Term: Improve cardiorespiratory fitness, muscular endurance and strength as measured by increased METs and functional capacity ( )  Able to understand and use rate of perceived exertion (RPE) scale Yes       Intervention Provide education and explanation on how to use RPE scale       Expected Outcomes Short Term: Able to use RPE daily in rehab to express subjective intensity level;Long Term:  Able to use RPE to guide intensity level when exercising independently       Able to understand and use Dyspnea scale Yes       Intervention Provide education and explanation on how to use Dyspnea scale       Expected Outcomes Short Term: Able to use Dyspnea scale daily in rehab to express subjective sense of shortness of breath during exertion;Long Term: Able to use Dyspnea scale to guide intensity level when exercising independently       Knowledge and understanding of Target Heart Rate Range (THRR) Yes       Intervention Provide education and explanation of THRR including how the numbers were predicted and where they are located for reference       Expected Outcomes Short Term: Able to state/look up THRR;Long Term: Able to use THRR to govern intensity when exercising independently;Short Term: Able to use daily as guideline for intensity in rehab       Understanding of Exercise Prescription Yes       Intervention Provide education, explanation, and written materials on patient's individual exercise prescription       Expected Outcomes Short Term: Able to explain program exercise  prescription;Long Term: Able to explain home exercise prescription to exercise independently                Exercise Goals Re-Evaluation :  Exercise Goals Re-Evaluation     Row Name 10/22/22 1650 11/12/22 1633 12/01/22 1630         Exercise Goal Re-Evaluation   Exercise Goals Review Increase Physical Activity;Understanding of Exercise Prescription;Increase Strength and Stamina;Knowledge and understanding of Target Heart Rate Range (THRR);Able to understand and use rate of perceived exertion (RPE) scale Increase Physical Activity;Understanding of Exercise Prescription;Increase Strength and Stamina;Knowledge and understanding of Target Heart Rate Range (THRR);Able to understand and use rate of perceived exertion (RPE) scale Increase Physical Activity;Understanding of Exercise Prescription;Increase Strength and Stamina;Knowledge and understanding of Target Heart Rate Range (THRR);Able to understand and use rate of perceived exertion (RPE) scale     Comments Pt first day in the [facility] program. Pt tolerated exercise well with an average MET level of 2.62. Pt is learning his THRR, RPE and ExRx. Off to a great start Reviewed MET's and goals. Pt tolerated exercise well with an average MET level of 3.22. Pt feels good about his goals of gaining strenght and stamina. He is progressing well and feels good. Will review home Exercise soon Reviewed MET's with patient today. Pt tolerated exercise well with an average MET level of 3.57. Pt is feeling good about the progress he is making     Expected Outcomes Will continue to monitor pt and progress workloads as tolerated without sign or symptom Will continue to monitor pt and progress workloads as tolerated without sign or symptom Will continue to monitor pt and progress workloads as tolerated without sign or symptom              Discharge Exercise Prescription (Final Exercise Prescription Changes):  Exercise Prescription Changes - 12/01/22 1630        Response to Exercise   Blood Pressure (Admit) 128/64    Blood Pressure (Exercise)  148/78    Blood Pressure (Exit) 102/62    Heart Rate (Admit) 48 bpm    Heart Rate (Exercise) 103 bpm    Heart Rate (Exit) 57 bpm    Rating of Perceived Exertion (Exercise) 12    Perceived Dyspnea (Exercise) 0    Symptoms none    Comments Reviewed MET's    Duration Progress to 30 minutes of  aerobic without signs/symptoms of physical distress    Intensity THRR unchanged      Progression   Progression Continue to progress workloads to maintain intensity without signs/symptoms of physical distress.    Average METs 3.57      Resistance Training   Training Prescription Yes    Weight 4 lbs    Reps 10-15    Time 10 Minutes      Arm Ergometer   Level 2.5    Watts 22    Minutes 15    METs 3.7      Track   Laps 19    Minutes 15    METs 3.43      Home Exercise Plan   Plans to continue exercise at [employer] (comment)    Frequency Add 3 additional days to program exercise sessions.    Initial Home Exercises Provided 11/14/22             Nutrition:  Target Goals: Understanding of nutrition guidelines, daily intake of sodium 1500mg , cholesterol 200mg , calories 30% from fat and 7% or less from saturated fats, daily to have 5 or more servings of fruits and vegetables.  Biometrics:  Pre Biometrics - 10/16/22 1124       Pre Biometrics   Waist Circumference 40 inches    Hip Circumference 39 inches    Waist to Hip Ratio 1.03 %    Triceps Skinfold 16 mm    % Body Fat 29.2 %    Grip Strength 35 kg    Flexibility 5 in    Single Leg Stand 12 seconds              Nutrition Therapy Plan and Nutrition Goals:  Nutrition Therapy & Goals - 11/19/22 0823       Nutrition Therapy   Diet heart healthy diet    Drug/Food Interactions Statins/Certain Fruits      Personal Nutrition Goals   Nutrition Goal Patient to identify strategies for reducing cardiovascular risk by attending the  weekly [facility] education and nutrition series    Personal Goal #2 Patient to improve diet quality by using the plate method as a daily guide for meal planning to include lean protein/plant protein, fruits, vegetables, whole grains, nonfat dairy as part of well balanced diet    Personal Goal #3 Patient to reduce sodium to 1500mg  per day    Comments Goals in action. Joe regularly attends the [facility] education and nutrition series. He has implemented many dietary changes including choosing fish regularly, increased dietary fiber, and reduced sodium/sugar/saturated fat. Joe will benefit from participation in intensive cardiac rehab for nutrition, exercise, and lifestyle modification.      Intervention Plan   Intervention Prescribe, educate and counsel regarding individualized specific dietary modifications aiming towards targeted core components such as weight, hypertension, lipid management, diabetes, heart failure and other comorbidities.;Nutrition handout(s) given to patient.    Expected Outcomes Short Term Goal: Understand basic principles of dietary content, such as calories, fat, sodium, cholesterol and nutrients.;Long Term Goal: Adherence to prescribed nutrition plan.  Nutrition Assessments:  MEDIFICTS Score Key: ?70 Need to make dietary changes  40-70 Heart Healthy Diet ? 40 Therapeutic Level Cholesterol Diet    Picture Your Plate Scores: <40 Unhealthy dietary pattern with much room for improvement. 41-50 Dietary pattern unlikely to meet recommendations for good health and room for improvement. 51-60 More healthful dietary pattern, with some room for improvement.  >60 Healthy dietary pattern, although there may be some specific behaviors that could be improved.    Nutrition Goals Re-Evaluation:  Nutrition Goals Re-Evaluation     Row Name 10/22/22 1622 11/19/22 0823           Goals   Current Weight 173 lb 8 oz (78.7 kg) 173 lb 8 oz (78.7 kg)      Comment  lipids WNL, lipoprotein A 114.3, zinc elevated- recommended to reduce OTC supplements No new labs at this time. Most recent labs lipids WNL, lipoprotein A 114.3, zinc elevated- recommended to reduce OTC supplements      Expected Outcome Joe will benefit from participation in intensive cardiac rehab for nutrition, exercise, and lifestyle modification. Goals in action. Joe regularly attends the [facility] education and nutrition series. He has implemented many dietary changes including choosing fish regularly, increased dietary fiber, and reduced sodium/sugar/saturated fat. His wife remains very supportive. He has maintained his weight since starting with our program. Gabriel Rung will benefit from participation in intensive cardiac rehab for nutrition, exercise, and lifestyle modification.               Nutrition Goals Re-Evaluation:  Nutrition Goals Re-Evaluation     Row Name 10/22/22 1622 11/19/22 0823           Goals   Current Weight 173 lb 8 oz (78.7 kg) 173 lb 8 oz (78.7 kg)      Comment lipids WNL, lipoprotein A 114.3, zinc elevated- recommended to reduce OTC supplements No new labs at this time. Most recent labs lipids WNL, lipoprotein A 114.3, zinc elevated- recommended to reduce OTC supplements      Expected Outcome Joe will benefit from participation in intensive cardiac rehab for nutrition, exercise, and lifestyle modification. Goals in action. Joe regularly attends the [facility] education and nutrition series. He has implemented many dietary changes including choosing fish regularly, increased dietary fiber, and reduced sodium/sugar/saturated fat. His wife remains very supportive. He has maintained his weight since starting with our program. Gabriel Rung will benefit from participation in intensive cardiac rehab for nutrition, exercise, and lifestyle modification.               Nutrition Goals Discharge (Final Nutrition Goals Re-Evaluation):  Nutrition Goals Re-Evaluation - 11/19/22 9811        Goals   Current Weight 173 lb 8 oz (78.7 kg)    Comment No new labs at this time. Most recent labs lipids WNL, lipoprotein A 114.3, zinc elevated- recommended to reduce OTC supplements    Expected Outcome Goals in action. Joe regularly attends the [facility] education and nutrition series. He has implemented many dietary changes including choosing fish regularly, increased dietary fiber, and reduced sodium/sugar/saturated fat. His wife remains very supportive. He has maintained his weight since starting with our program. Gabriel Rung will benefit from participation in intensive cardiac rehab for nutrition, exercise, and lifestyle modification.             Psychosocial: Target Goals: Acknowledge presence or absence of significant depression and/or stress, maximize coping skills, provide positive support system. Participant is able to verbalize types and ability to  use techniques and skills needed for reducing stress and depression.  Initial Review & Psychosocial Screening:  Initial Psych Review & Screening - 10/16/22 1312       Initial Review   Current issues with None Identified      Family Dynamics   Good Support System? Yes   Gabriel Rung has his wife for support     Barriers   Psychosocial barriers to participate in program There are no identifiable barriers or psychosocial needs.      Screening Interventions   Interventions Encouraged to exercise             Quality of Life Scores:  Quality of Life - 10/16/22 1447       Quality of Life   Select Quality of Life      Quality of Life Scores   Health/Function Pre 28.47 %    Socioeconomic Pre 29.58 %    Psych/Spiritual Pre 30 %    Family Pre 28.8 %    GLOBAL Pre 29.05 %            Scores of 19 and below usually indicate a poorer quality of life in these areas.  A difference of  2-3 points is a clinically meaningful difference.  A difference of 2-3 points in the total score of the Quality of Life Index has been associated with  significant improvement in overall quality of life, self-image, physical symptoms, and general health in studies assessing change in quality of life.  PHQ-9: Review Flowsheet  More data exists      10/16/2022 03/21/2022 03/20/2021 03/14/2021 10/04/2020  Depression screen PHQ 2/9  Decreased Interest 0 0 0 0 0 0  Down, Depressed, Hopeless 0 0 0 0 0 0  PHQ - 2 Score 0 0 0 0 0 0  Altered sleeping 0 0 - - - -  Tired, decreased energy 0 0 - - - -  Change in appetite 0 0 - - - -  Feeling bad or failure about yourself  0 0 - - - -  Trouble concentrating 0 0 - - - -  Moving slowly or fidgety/restless 0 0 - - - -  Suicidal thoughts 0 0 - - - -  PHQ-9 Score 0 0 - - - -  Difficult doing work/chores Not difficult at all - - - -   Interpretation of Total Score  Total Score Depression Severity:  1-4 = Minimal depression, 5-9 = Mild depression, 10-14 = Moderate depression, 15-19 = Moderately severe depression, 20-27 = Severe depression   Psychosocial Evaluation and Intervention:   Psychosocial Re-Evaluation:  Psychosocial Re-Evaluation     Row Name 11/13/22 0741 12/05/22 1731           Psychosocial Re-Evaluation   Current issues with None Identified None Identified      Interventions Encouraged to attend Cardiac Rehabilitation for the exercise Encouraged to attend Cardiac Rehabilitation for the exercise      Continue Psychosocial Services  No Follow up required No Follow up required               Psychosocial Discharge (Final Psychosocial Re-Evaluation):  Psychosocial Re-Evaluation - 12/05/22 1731       Psychosocial Re-Evaluation   Current issues with None Identified    Interventions Encouraged to attend Cardiac Rehabilitation for the exercise    Continue Psychosocial Services  No Follow up required             Vocational Rehabilitation: Provide vocational rehab assistance to  qualifying candidates.   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 Participant will state understanding/return demonstration of topics presented.    Education     Row Name 10/22/22 1600     Education   Cardiac Education Topics Pritikin   Licensed conveyancer Nutrition   Nutrition Facts on Fat   Instruction Review Code 1- Verbalizes Understanding   Class Start Time 1400   Class Stop Time 1450   Class Time Calculation (min) 50 min    Row Name 10/29/22 1500     Education   Cardiac Education Topics Pritikin   Armed forces operational officer   Weekly Topic Adding Flavor - Sodium-Free   Instruction Review Code 1- Verbalizes Understanding   Class Start Time 1400   Class Stop Time 1453   Class Time Calculation (min) 53 min    Row Name 10/31/22 1500     Education   Cardiac Education Topics Pritikin   Hospital doctor Education   General Education Heart Disease Risk Reduction   Instruction Review Code 1- Verbalizes Understanding   Class Start Time 1355   Class Stop Time 1440   Class Time Calculation (min) 45 min    Row Name 11/03/22 1500     Education   Cardiac Education Topics Pritikin   Geographical information systems officer Exercise   Exercise Workshop Location manager and Fall Prevention   Instruction Review Code 1- Verbalizes Understanding   Class Start Time 1350   Class Stop Time 1432   Class Time Calculation (min) 42 min    Row Name 11/05/22 1500     Education   Cardiac Education Topics Pritikin    Customer service manager   Weekly Topic Fast and Healthy Breakfasts   Instruction Review Code 1- Verbalizes Understanding   Class Start Time 1355   Class Stop Time 1450   Class Time Calculation (min) 55 min    Row Name 11/07/22 1600     Education   Cardiac Education Topics Pritikin   Nurse, children's Dietitian   Nutrition Overview of the Pritikin Eating Plan   Instruction Review Code 1- Verbalizes Understanding   Class Start Time 1400   Class Stop Time 1436   Class Time Calculation (min) 36 min    Row Name 11/10/22 1600     Education   Cardiac Education Topics Pritikin   Select Workshops     Workshops   Educator Exercise Physiologist   Select Psychosocial   Psychosocial Workshop Recognizing and Reducing Stress   Instruction Review Code 1- Verbalizes Understanding   Class Start Time 1405   Class Stop Time 1454   Class Time Calculation (min) 49 min    Row  Name 11/12/22 1600     Education   Cardiac Education Topics Pritikin   Customer service manager   Weekly Topic Personalizing Your Pritikin Plate   Instruction Review Code 1- Verbalizes Understanding   Class Start Time 1400   Class Stop Time 1448   Class Time Calculation (min) 48 min    Row Name 11/14/22 1500     Education   Cardiac Education Topics Pritikin   Research officer, political party Exercise Physiologist   Select Psychosocial   Psychosocial Healthy Minds, Bodies, Hearts   Instruction Review Code 1- Verbalizes Understanding   Class Start Time 1405   Class Stop Time 1443   Class Time Calculation (min) 38 min    Row Name 11/17/22 1600     Education   Cardiac Education Topics Pritikin   Glass blower/designer Nutrition   Nutrition Workshop Label Reading   Instruction Review Code 1- Tax inspector   Class Start Time 1400    Class Stop Time 1445   Class Time Calculation (min) 45 min    Row Name 11/19/22 1600     Education   Cardiac Education Topics Pritikin   Orthoptist   Educator Dietitian   Weekly Topic Delicious Desserts   Instruction Review Code 1- Verbalizes Understanding   Class Start Time 1400   Class Stop Time 1446   Class Time Calculation (min) 46 min    Row Name 11/21/22 0849     Education   Cardiac Education Topics Pritikin   Select Core Videos     Core Videos   Educator Dietitian   Select Nutrition   Nutrition Other   Instruction Review Code 1- Verbalizes Understanding   Class Start Time 1400   Class Stop Time 1446   Class Time Calculation (min) 46 min     Cooking School   Instruction Review Code 1- [employer] Understanding    Row Name 11/24/22 1500     Education   Cardiac Education Topics Pritikin   Select Workshops     Workshops   Educator Exercise Physiologist   Select Exercise   Exercise Workshop Exercise Basics: Building Your Action Plan   Instruction Review Code 1- Verbalizes Understanding   Class Start Time 1405   Class Stop Time 1452   Class Time Calculation (min) 47 min    Row Name 11/26/22 1500     Education   Cardiac Education Topics Pritikin   Customer service manager   Weekly Topic Tasty Appetizers and Snacks   Instruction Review Code 1- Verbalizes Understanding   Class Start Time 1400   Class Stop Time 1443   Class Time Calculation (min) 43 min    Row Name 11/28/22 1500     Education   Cardiac Education Topics Pritikin   Nurse, children's   Educator Dietitian   Select Nutrition   Nutrition Calorie Density   Instruction Review Code 1- Verbalizes Understanding   Class Start Time 1400   Class Stop Time 1440   Class Time Calculation (min) 40 min    Row Name 12/01/22 1500     Education   Cardiac Education Topics Pritikin   Psychologist, sport and exercise     Core  Videos  Educator Dietitian   Select Nutrition   Nutrition Nutrition Action Plan   Instruction Review Code 1- Verbalizes Understanding   Class Start Time 1405   Class Stop Time 1440   Class Time Calculation (min) 35 min    Row Name 12/03/22 1600     Education   Cardiac Education Topics Pritikin   Customer service manager   Weekly Topic Efficiency Cooking - Meals in a Snap   Instruction Review Code 1- Verbalizes Understanding   Class Start Time 1400   Class Stop Time 1445   Class Time Calculation (min) 45 min    Row Name 12/05/22 1400     Education   Cardiac Education Topics Pritikin   Psychologist, forensic Exercise Education   Exercise Education Move It!   Instruction Review Code 1- Verbalizes Understanding   Class Start Time 1359   Class Stop Time 1433   Class Time Calculation (min) 34 min            Core Videos: Exercise    Move It!  Clinical staff conducted group or individual video education with verbal and written material and guidebook.  Patient learns the recommended Pritikin exercise program. Exercise with the goal of living a long, healthy life. Some of the health benefits of exercise include controlled diabetes, healthier blood pressure levels, improved cholesterol levels, improved heart and lung capacity, improved sleep, and better body composition. Everyone should speak with their doctor before starting or changing an exercise routine.  Biomechanical Limitations Clinical staff conducted group or individual video education with verbal and written material and guidebook.  Patient learns how biomechanical limitations can impact exercise and how we can mitigate and possibly overcome limitations to have an impactful and balanced exercise routine.  Body Composition Clinical staff conducted group or individual video education with verbal and written material and  guidebook.  Patient learns that body composition (ratio of muscle mass to fat mass) is a key component to assessing overall fitness, rather than body weight alone. Increased fat mass, especially visceral belly fat, can put [place] at increased risk for metabolic syndrome, type 2 diabetes, heart disease, and even death. It is recommended to combine diet and exercise (cardiovascular and resistance training) to improve your body composition. Seek guidance from your physician and exercise physiologist before implementing an exercise routine.  Exercise Action Plan Clinical staff conducted group or individual video education with verbal and written material and guidebook.  Patient learns the recommended strategies to achieve and enjoy long-term exercise adherence, including variety, self-motivation, self-efficacy, and positive decision making. Benefits of exercise include fitness, good health, weight management, more energy, better sleep, less stress, and overall well-being.  Medical   Heart Disease Risk Reduction Clinical staff conducted group or individual video education with verbal and written material and guidebook.  Patient learns our heart is our most vital organ as it circulates oxygen, nutrients, white blood cells, and hormones throughout the entire body, and carries waste away. Data supports a plant-based eating plan like the Pritikin Program for its effectiveness in slowing progression of and reversing heart disease. The video provides a number of recommendations to address heart disease.   Metabolic Syndrome and Belly Fat  Clinical staff conducted group or individual video education with verbal and written material and guidebook.  Patient learns what metabolic syndrome is, how it leads to heart disease, and  how one can reverse it and keep it from coming back. You have metabolic syndrome if you have 3 of the following 5 criteria: abdominal obesity, high blood pressure, high triglycerides, low HDL  cholesterol, and high blood sugar.  Hypertension and Heart Disease Clinical staff conducted group or individual video education with verbal and written material and guidebook.  Patient learns that high blood pressure, or hypertension, is very common in the [place]. Hypertension is largely due to excessive salt intake, but other important risk factors include being overweight, physical inactivity, drinking too much alcohol, smoking, and not eating enough potassium from fruits and vegetables. High blood pressure is a leading risk factor for heart attack, stroke, congestive heart failure, dementia, kidney failure, and premature death. Long-term effects of excessive salt intake include stiffening of the arteries and thickening of heart muscle and organ damage. Recommendations include ways to reduce hypertension and the risk of heart disease.  Diseases of Our Time - Focusing on Diabetes Clinical staff conducted group or individual video education with verbal and written material and guidebook.  Patient learns why the best way to stop diseases of our time is prevention, through food and other lifestyle changes. Medicine (such as prescription pills and surgeries) is often only a Band-Aid on the problem, not a long-term solution. Most common diseases of our time include obesity, type 2 diabetes, hypertension, heart disease, and cancer. The Pritikin Program is recommended and has been proven to help reduce, reverse, and/or prevent the damaging effects of metabolic syndrome.  Nutrition   Overview of the Pritikin Eating Plan  Clinical staff conducted group or individual video education with verbal and written material and guidebook.  Patient learns about the Pritikin Eating Plan for disease risk reduction. The Pritikin Eating Plan emphasizes a wide variety of unrefined, minimally-processed carbohydrates, like fruits, vegetables, whole grains, and legumes. Go, Caution, and Stop food choices are explained.  Plant-based and lean animal proteins are emphasized. Rationale provided for low sodium intake for blood pressure control, low added sugars for blood sugar stabilization, and low added fats and oils for coronary artery disease risk reduction and weight management.  Calorie Density  Clinical staff conducted group or individual video education with verbal and written material and guidebook.  Patient learns about calorie density and how it impacts the Pritikin Eating Plan. Knowing the characteristics of the food you choose will help you decide whether those foods will lead to weight gain or weight loss, and whether you want to consume more or less of them. Weight loss is usually a side effect of the Pritikin Eating Plan because of its focus on low calorie-dense foods.  Label Reading  Clinical staff conducted group or individual video education with verbal and written material and guidebook.  Patient learns about the Pritikin recommended label reading guidelines and corresponding recommendations regarding calorie density, added sugars, sodium content, and whole grains.  Dining Out - Part 1  Clinical staff conducted group or individual video education with verbal and written material and guidebook.  Patient learns that restaurant meals can be sabotaging because they can be so high in calories, fat, sodium, and/or sugar. Patient learns recommended strategies on how to positively address this and avoid unhealthy pitfalls.  Facts on Fats  Clinical staff conducted group or individual video education with verbal and written material and guidebook.  Patient learns that lifestyle modifications can be just as effective, if not more so, as many medications for lowering your risk of heart disease. A Pritikin lifestyle can  help to reduce your risk of inflammation and atherosclerosis (cholesterol build-up, or plaque, in the artery walls). Lifestyle interventions such as dietary choices and physical activity address  the cause of atherosclerosis. A review of the types of fats and their impact on blood cholesterol levels, along with dietary recommendations to reduce fat intake is also included.  Nutrition Action Plan  Clinical staff conducted group or individual video education with verbal and written material and guidebook.  Patient learns how to incorporate Pritikin recommendations into their lifestyle. Recommendations include planning and keeping personal health goals in mind as an important part of their success.  Healthy Mind-Set    Healthy Minds, Bodies, Hearts  Clinical staff conducted group or individual video education with verbal and written material and guidebook.  Patient learns how to identify when they are stressed. Video will discuss the impact of that stress, as well as the many benefits of stress management. Patient will also be introduced to stress management techniques. The way we think, act, and feel has an impact on our hearts.  How Our Thoughts Can Heal Our Hearts  Clinical staff conducted group or individual video education with verbal and written material and guidebook.  Patient learns that negative thoughts can cause depression and anxiety. This can result in negative lifestyle behavior and serious health problems. Cognitive behavioral therapy is an effective method to help control our thoughts in order to change and improve our emotional outlook.  Additional Videos:  Exercise    Improving Performance  Clinical staff conducted group or individual video education with verbal and written material and guidebook.  Patient learns to use a non-linear approach by alternating intensity levels and lengths of time spent exercising to help burn more calories and lose more body fat. Cardiovascular exercise helps improve heart health, metabolism, hormonal balance, blood sugar control, and recovery from fatigue. Resistance training improves strength, endurance, balance, coordination, reaction time,  metabolism, and muscle mass. Flexibility exercise improves circulation, posture, and balance. Seek guidance from your physician and exercise physiologist before implementing an exercise routine and learn your capabilities and proper form for all exercise.  Introduction to Yoga  Clinical staff conducted group or individual video education with verbal and written material and guidebook.  Patient learns about yoga, a discipline of the coming together of mind, breath, and body. The benefits of yoga include improved flexibility, improved range of motion, better posture and core strength, increased lung function, weight loss, and positive self-image. Yoga's heart health benefits include lowered blood pressure, healthier heart rate, decreased cholesterol and triglyceride levels, improved immune function, and reduced stress. Seek guidance from your physician and exercise physiologist before implementing an exercise routine and learn your capabilities and proper form for all exercise.  Medical   Aging: Enhancing Your Quality of Life  Clinical staff conducted group or individual video education with verbal and written material and guidebook.  Patient learns key strategies and recommendations to stay in good physical health and enhance quality of life, such as prevention strategies, having an advocate, securing a Health Care Proxy and Power of Attorney, and keeping a list of medications and system for tracking them. It also discusses how to avoid risk for bone loss.  Biology of Weight Control  Clinical staff conducted group or individual video education with verbal and written material and guidebook.  Patient learns that weight gain occurs because we consume more calories than we burn (eating more, moving less). Even if your body weight is normal, you may have higher ratios of  fat compared to muscle mass. Too much body fat puts you at increased risk for cardiovascular disease, heart attack, stroke, type 2  diabetes, and obesity-related cancers. In addition to exercise, following the Pritikin Eating Plan can help reduce your risk.  Decoding Lab Results  Clinical staff conducted group or individual video education with verbal and written material and guidebook.  Patient learns that lab test reflects one measurement whose values change over time and are influenced by many factors, including medication, stress, sleep, exercise, food, hydration, pre-existing medical conditions, and more. It is recommended to use the knowledge from this video to become more involved with your lab results and evaluate your numbers to speak with your doctor.   Diseases of Our Time - Overview  Clinical staff conducted group or individual video education with verbal and written material and guidebook.  Patient learns that according to the CDC, 50% to 70% of chronic diseases (such as obesity, type 2 diabetes, elevated lipids, hypertension, and heart disease) are avoidable through lifestyle improvements including healthier food choices, listening to satiety cues, and increased physical activity.  Sleep Disorders Clinical staff conducted group or individual video education with verbal and written material and guidebook.  Patient learns how good quality and duration of sleep are important to overall health and well-being. Patient also learns about sleep disorders and how they impact health along with recommendations to address them, including discussing with a physician.  Nutrition  Dining Out - Part 2 Clinical staff conducted group or individual video education with verbal and written material and guidebook.  Patient learns how to plan ahead and communicate in order to maximize their dining experience in a healthy and nutritious manner. Included are recommended food choices based on the type of restaurant the patient is visiting.   Fueling a Banker conducted group or individual video education with verbal  and written material and guidebook.  There is a strong connection between our food choices and our health. Diseases like obesity and type 2 diabetes are very prevalent and are in large-part due to lifestyle choices. The Pritikin Eating Plan provides plenty of food and hunger-curbing satisfaction. It is easy to follow, affordable, and helps reduce health risks.  Menu Workshop  Clinical staff conducted group or individual video education with verbal and written material and guidebook.  Patient learns that restaurant meals can sabotage health goals because they are often packed with calories, fat, sodium, and sugar. Recommendations include strategies to plan ahead and to communicate with the manager, chef, or server to help order a healthier meal.  Planning Your Eating Strategy  Clinical staff conducted group or individual video education with verbal and written material and guidebook.  Patient learns about the Pritikin Eating Plan and its benefit of reducing the risk of disease. The Pritikin Eating Plan does not focus on calories. Instead, it emphasizes high-quality, nutrient-rich foods. By knowing the characteristics of the foods, we choose, we can determine their calorie density and make informed decisions.  Targeting Your Nutrition Priorities  Clinical staff conducted group or individual video education with verbal and written material and guidebook.  Patient learns that lifestyle habits have a tremendous impact on disease risk and progression. This video provides eating and physical activity recommendations based on your personal health goals, such as reducing LDL cholesterol, losing weight, preventing or controlling type 2 diabetes, and reducing high blood pressure.  Vitamins and Minerals  Clinical staff conducted group or individual video education with verbal and written material and  guidebook.  Patient learns different ways to obtain key vitamins and minerals, including through a recommended  healthy diet. It is important to discuss all supplements you take with your doctor.   Healthy Mind-Set    Smoking Cessation  Clinical staff conducted group or individual video education with verbal and written material and guidebook.  Patient learns that cigarette smoking and tobacco addiction pose a serious health risk which affects millions of people. Stopping smoking will significantly reduce the risk of heart disease, lung disease, and many forms of cancer. Recommended strategies for quitting are covered, including working with your doctor to develop a successful plan.  Culinary   Becoming a Set designer conducted group or individual video education with verbal and written material and guidebook.  Patient learns that cooking at home can be healthy, cost-effective, quick, and puts them in control. Keys to cooking healthy recipes will include looking at your recipe, assessing your equipment needs, planning ahead, making it simple, choosing cost-effective seasonal ingredients, and limiting the use of added fats, salts, and sugars.  Cooking - Breakfast and Snacks  Clinical staff conducted group or individual video education with verbal and written material and guidebook.  Patient learns how important breakfast is to satiety and nutrition through the entire day. Recommendations include key foods to eat during breakfast to help stabilize blood sugar levels and to prevent overeating at meals later in the day. Planning ahead is also a key component.  Cooking - Educational psychologist conducted group or individual video education with verbal and written material and guidebook.  Patient learns eating strategies to improve overall health, including an approach to cook more at home. Recommendations include thinking of animal protein as a side on your plate rather than center stage and focusing instead on lower calorie dense options like vegetables, fruits, whole grains, and  plant-based proteins, such as beans. Making sauces in large quantities to freeze for later and leaving the skin on your vegetables are also recommended to maximize your experience.  Cooking - Healthy Salads and Dressing Clinical staff conducted group or individual video education with verbal and written material and guidebook.  Patient learns that vegetables, fruits, whole grains, and legumes are the foundations of the Pritikin Eating Plan. Recommendations include how to incorporate each of these in flavorful and healthy salads, and how to create homemade salad dressings. Proper handling of ingredients is also covered. Cooking - Soups and [employer] - Soups and Desserts Clinical staff conducted group or individual video education with verbal and written material and guidebook.  Patient learns that Pritikin soups and desserts make for easy, nutritious, and delicious snacks and meal components that are low in sodium, fat, sugar, and calorie density, while high in vitamins, minerals, and filling fiber. Recommendations include simple and healthy ideas for soups and desserts.   Overview     The Pritikin Solution Program Overview Clinical staff conducted group or individual video education with verbal and written material and guidebook.  Patient learns that the results of the Pritikin Program have been documented in more than 100 articles published in peer-reviewed journals, and the benefits include reducing risk factors for (and, in some cases, even reversing) high cholesterol, high blood pressure, type 2 diabetes, obesity, and more! An overview of the three key pillars of the Pritikin Program will be covered: eating well, doing regular exercise, and having a healthy mind-set.  WORKSHOPS  Exercise: Exercise Basics: Building Your Action Plan Clinical staff led group  instruction and group discussion with PowerPoint presentation and patient guidebook. To enhance the learning environment the use of  posters, models and videos may be added. At the conclusion of this workshop, patients will comprehend the difference between physical activity and exercise, as well as the benefits of incorporating both, into their routine. Patients will understand the FITT (Frequency, Intensity, Time, and Type) principle and how to use it to build an exercise action plan. In addition, safety concerns and other considerations for exercise and cardiac rehab will be addressed by the presenter. The purpose of this lesson is to promote a comprehensive and effective weekly exercise routine in order to improve patients' overall level of fitness.   Managing Heart Disease: Your Path to a Healthier Heart Clinical staff led group instruction and group discussion with PowerPoint presentation and patient guidebook. To enhance the learning environment the use of posters, models and videos may be added.At the conclusion of this workshop, patients will understand the anatomy and physiology of the heart. Additionally, they will understand how Pritikin's three pillars impact the risk factors, the progression, and the management of heart disease.  The purpose of this lesson is to provide a high-level overview of the heart, heart disease, and how the Pritikin lifestyle positively impacts risk factors.  Exercise Biomechanics Clinical staff led group instruction and group discussion with PowerPoint presentation and patient guidebook. To enhance the learning environment the use of posters, models and videos may be added. Patients will learn how the structural parts of their bodies function and how these functions impact their daily activities, movement, and exercise. Patients will learn how to promote a neutral spine, learn how to manage pain, and identify ways to improve their physical movement in order to promote healthy living. The purpose of this lesson is to expose patients to common physical limitations that impact physical  activity. Participants will learn practical ways to adapt and manage aches and pains, and to minimize their effect on regular exercise. Patients will learn how to maintain good posture while sitting, walking, and lifting.  Balance Training and Fall Prevention  Clinical staff led group instruction and group discussion with PowerPoint presentation and patient guidebook. To enhance the learning environment the use of posters, models and videos may be added. At the conclusion of this workshop, patients will understand the importance of their sensorimotor skills (vision, proprioception, and the vestibular system) in maintaining their ability to balance as they age. Patients will apply a variety of balancing exercises that are appropriate for their current level of function. Patients will understand the common causes for poor balance, possible solutions to these problems, and ways to modify their physical environment in order to minimize their fall risk. The purpose of this lesson is to teach patients about the importance of maintaining balance as they age and ways to minimize their risk of falling.  WORKSHOPS   Nutrition:  Fueling a Ship broker led group instruction and group discussion with PowerPoint presentation and patient guidebook. To enhance the learning environment the use of posters, models and videos may be added. Patients will review the foundational principles of the Pritikin Eating Plan and understand what constitutes a serving size in each of the food groups. Patients will also learn Pritikin-friendly foods that are better choices when away from home and review make-ahead meal and snack options. Calorie density will be reviewed and applied to three nutrition priorities: weight maintenance, weight loss, and weight gain. The purpose of this lesson is to reinforce (in a  group setting) the key concepts around what patients are recommended to eat and how to apply these guidelines  when away from home by planning and selecting Pritikin-friendly options. Patients will understand how calorie density may be adjusted for different weight management goals.  Mindful Eating  Clinical staff led group instruction and group discussion with PowerPoint presentation and patient guidebook. To enhance the learning environment the use of posters, models and videos may be added. Patients will briefly review the concepts of the Pritikin Eating Plan and the importance of low-calorie dense foods. The concept of mindful eating will be introduced as well as the importance of paying attention to internal hunger signals. Triggers for non-hunger eating and techniques for dealing with triggers will be explored. The purpose of this lesson is to provide patients with the opportunity to review the basic principles of the Pritikin Eating Plan, discuss the value of eating mindfully and how to measure internal cues of hunger and fullness using the Hunger Scale. Patients will also discuss reasons for non-hunger eating and learn strategies to use for controlling emotional eating.  Targeting Your Nutrition Priorities Clinical staff led group instruction and group discussion with PowerPoint presentation and patient guidebook. To enhance the learning environment the use of posters, models and videos may be added. Patients will learn how to determine their genetic susceptibility to disease by reviewing their family history. Patients will gain insight into the importance of diet as part of an overall healthy lifestyle in mitigating the impact of genetics and other environmental insults. The purpose of this lesson is to provide patients with the opportunity to assess their personal nutrition priorities by looking at their family history, their own health history and current risk factors. Patients will also be able to discuss ways of prioritizing and modifying the Pritikin Eating Plan for their highest risk areas  Menu   Clinical staff led group instruction and group discussion with PowerPoint presentation and patient guidebook. To enhance the learning environment the use of posters, models and videos may be added. Using menus brought in from [employer], or printed from [employer], patients will apply the Pritikin dining out guidelines that were presented in the [employer] video. Patients will also be able to practice these guidelines in a variety of provided scenarios. The purpose of this lesson is to provide patients with the opportunity to practice hands-on learning of the Pritikin Dining Out guidelines with actual menus and practice scenarios.  Label Reading Clinical staff led group instruction and group discussion with PowerPoint presentation and patient guidebook. To enhance the learning environment the use of posters, models and videos may be added. Patients will review and discuss the Pritikin label reading guidelines presented in Pritikin's Label Reading Educational series video. Using fool labels brought in from local grocery stores and markets, patients will apply the label reading guidelines and determine if the packaged food meet the Pritikin guidelines. The purpose of this lesson is to provide patients with the opportunity to review, discuss, and practice hands-on learning of the Pritikin Label Reading guidelines with actual packaged food labels. Cooking School  Pritikin's [employer] are designed to teach patients ways to prepare quick, simple, and affordable recipes at home. The importance of nutrition's role in chronic disease risk reduction is reflected in its emphasis in the overall Pritikin program. By learning how to prepare essential core Pritikin Eating Plan recipes, patients will increase control over what they eat; be able to customize the flavor of foods without the  use of added salt, sugar, or fat; and improve the quality of the food they consume. By  learning a set of core recipes which are easily assembled, quickly prepared, and affordable, patients are more likely to prepare more healthy foods at home. These workshops focus on convenient breakfasts, simple entres, side dishes, and desserts which can be prepared with minimal effort and are consistent with nutrition recommendations for cardiovascular risk reduction. Cooking [employer] are taught by a Armed forces logistics/support/administrative officer (RD) who has been trained by the [employer]. The chef or RD has a clear understanding of the importance of minimizing - if not completely eliminating - added fat, sugar, and sodium in recipes. Throughout the series of Cooking School Workshop sessions, patients will learn about healthy ingredients and efficient methods of cooking to build confidence in their capability to prepare    Cooking School weekly topics:  Adding Flavor- Sodium-Free  Fast and Healthy Breakfasts  Powerhouse Plant-Based Proteins  Satisfying Salads and Dressings  Simple Sides and Sauces  International Cuisine-Spotlight on the [employer] Zones  Delicious Desserts  Savory Soups  [employer] - Meals in a Astronomer Appetizers and Snacks  Comforting Weekend Breakfasts  One-Pot Wonders   Fast Evening Meals  Landscape architect Your Pritikin Plate  WORKSHOPS   Healthy Mindset (Psychosocial):  Focused Goals, Sustainable Changes Clinical staff led group instruction and group discussion with PowerPoint presentation and patient guidebook. To enhance the learning environment the use of posters, models and videos may be added. Patients will be able to apply effective goal setting strategies to establish at least one personal goal, and then take consistent, meaningful action toward that goal. They will learn to identify common barriers to achieving personal goals and develop strategies to overcome them. Patients will also gain an understanding of how our mind-set can  impact our ability to achieve goals and the importance of cultivating a positive and growth-oriented mind-set. The purpose of this lesson is to provide patients with a deeper understanding of how to set and achieve personal goals, as well as the tools and strategies needed to overcome common obstacles which may arise along the way.  From Head to Heart: The Power of a Healthy Outlook  Clinical staff led group instruction and group discussion with PowerPoint presentation and patient guidebook. To enhance the learning environment the use of posters, models and videos may be added. Patients will be able to recognize and describe the impact of emotions and mood on physical health. They will discover the importance of self-care and explore self-care practices which may work for them. Patients will also learn how to utilize the 4 C's to cultivate a healthier outlook and better manage stress and challenges. The purpose of this lesson is to demonstrate to patients how a healthy outlook is an essential part of maintaining good health, especially as they continue their cardiac rehab journey.  Healthy Sleep for a Healthy Heart Clinical staff led group instruction and group discussion with PowerPoint presentation and patient guidebook. To enhance the learning environment the use of posters, models and videos may be added. At the conclusion of this workshop, patients will be able to demonstrate knowledge of the importance of sleep to overall health, well-being, and quality of life. They will understand the symptoms of, and treatments for, common sleep disorders. Patients will also be able to identify daytime and nighttime behaviors which impact sleep, and they will be able to apply these tools to help manage sleep-related  challenges. The purpose of this lesson is to provide patients with a general overview of sleep and outline the importance of quality sleep. Patients will learn about a few of the most common sleep  disorders. Patients will also be introduced to the concept of "sleep hygiene," and discover ways to self-manage certain sleeping problems through simple daily behavior changes. Finally, the workshop will motivate patients by clarifying the links between quality sleep and their goals of heart-healthy living.   Recognizing and Reducing Stress Clinical staff led group instruction and group discussion with PowerPoint presentation and patient guidebook. To enhance the learning environment the use of posters, models and videos may be added. At the conclusion of this workshop, patients will be able to understand the types of stress reactions, differentiate between acute and chronic stress, and recognize the impact that chronic stress has on their health. They will also be able to apply different coping mechanisms, such as reframing negative self-talk. Patients will have the opportunity to practice a variety of stress management techniques, such as deep abdominal breathing, progressive muscle relaxation, and/or guided imagery.  The purpose of this lesson is to educate patients on the role of stress in their lives and to provide healthy techniques for coping with it.  Learning Barriers/Preferences:  Learning Barriers/Preferences - 10/16/22 1448       Learning Barriers/Preferences   Learning Barriers Sight;Exercise Concerns   balance, 12 seconds single leg stand, unsteady gait   Learning Preferences Pictoral;Video;Written Material;Skilled Demonstration;Individual Instruction;Group Instruction             Education Topics:  Knowledge Questionnaire Score:  Knowledge Questionnaire Score - 10/16/22 1450       Knowledge Questionnaire Score   Pre Score 21/24             Core Components/Risk Factors/Patient Goals at Admission:  Personal Goals and Risk Factors at Admission - 10/16/22 1451       Core Components/Risk Factors/Patient Goals on Admission    Weight Management Yes    Intervention  Weight Management: Develop a combined nutrition and exercise program designed to reach desired caloric intake, while maintaining appropriate intake of nutrient and fiber, sodium and fats, and appropriate energy expenditure required for the weight goal.;Weight Management: Provide education and appropriate resources to help participant work on and attain dietary goals.    Expected Outcomes Short Term: Continue to assess and modify interventions until short term weight is achieved;Long Term: Adherence to nutrition and physical activity/exercise program aimed toward attainment of established weight goal    Improve shortness of breath with ADL's Yes    Intervention Provide education, individualized exercise plan and daily activity instruction to help decrease symptoms of SOB with activities of daily living.    Expected Outcomes Short Term: Improve cardiorespiratory fitness to achieve a reduction of symptoms when performing ADLs;Long Term: Be able to perform more ADLs without symptoms or delay the onset of symptoms    Hypertension Yes    Intervention Provide education on lifestyle modifcations including regular physical activity/exercise, weight management, moderate sodium restriction and increased consumption of fresh fruit, vegetables, and low fat dairy, alcohol moderation, and smoking cessation.;Monitor prescription use compliance.    Expected Outcomes Short Term: Continued assessment and intervention until BP is < 140/32mm HG in hypertensive participants. < 130/70mm HG in hypertensive participants with diabetes, heart failure or chronic kidney disease.;Long Term: Maintenance of blood pressure at goal levels.    Lipids Yes    Intervention Provide education and support for participant on nutrition &  aerobic/resistive exercise along with prescribed medications to achieve LDL 70mg , HDL >40mg .    Expected Outcomes Short Term: Participant states understanding of desired cholesterol values and is compliant with  medications prescribed. Participant is following exercise prescription and nutrition guidelines.;Long Term: Cholesterol controlled with medications as prescribed, with individualized exercise RX and with personalized nutrition plan. Value goals: LDL < 70mg , HDL > 40 mg.    Personal Goal Other Yes    Personal Goal Short Term: Understand limits to exercise. Long term: Strength, Stamina    Intervention Will continue to monitor pt and progress WL as tolerated    Expected Outcomes Pt will acheive goals             Core Components/Risk Factors/Patient Goals Review:   Goals and Risk Factor Review     Row Name 11/13/22 0742 12/05/22 1732           Core Components/Risk Factors/Patient Goals Review   Personal Goals Review Weight Management/Obesity;Hypertension;Lipids Weight Management/Obesity;Hypertension;Lipids      Review Gabriel Rung is doing well with exercise at intensive cardiac rehab. Vital signs have been stable. Joe continues to do  well with exercise at intensive cardiac rehab. Vital signs have been stable.      Expected Outcomes Joe will continue to participate in intensive cardiac rehab for exercise, nutrition and lifestyle modifications Joe will continue to participate in intensive cardiac rehab for exercise, nutrition and lifestyle modifications               Core Components/Risk Factors/Patient Goals at Discharge (Final Review):   Goals and Risk Factor Review - 12/05/22 1732       Core Components/Risk Factors/Patient Goals Review   Personal Goals Review Weight Management/Obesity;Hypertension;Lipids    Review Joe continues to do  well with exercise at intensive cardiac rehab. Vital signs have been stable.    Expected Outcomes Joe will continue to participate in intensive cardiac rehab for exercise, nutrition and lifestyle modifications             ITP Comments:  ITP Comments     Row Name 10/16/22 0931 10/21/22 1151 11/13/22 0739 12/05/22 1731     ITP Comments Dr. Armanda Magic medical director. Introduction to pritikin education program/intensive cardiac rehab. Initial orientation packet reviewed with patient. 30 Day ITP Review. Joe is scheduled to begin cardiac rehab on 10/22/22 30 Day ITP Review. Joe has good attendance and participation in intensive cardiac rehab. 30 Day ITP Review. Joe continues to have  good attendance and participation in intensive cardiac rehab.             Comments: See ITP comments.Thayer Headings RN BSN

## 2022-12-08 ENCOUNTER — Encounter (HOSPITAL_COMMUNITY)
Admission: RE | Admit: 2022-12-08 | Discharge: 2022-12-08 | Disposition: A | Discharge status: Home / Self Care | Payer: PPO | Source: Ambulatory Visit | Attending: Cardiology | Admitting: Cardiology

## 2022-12-08 DIAGNOSIS — Z955 Presence of coronary angioplasty implant and graft: Secondary | ICD-10-CM

## 2022-12-09 ENCOUNTER — Other Ambulatory Visit: Payer: Self-pay

## 2022-12-09 DIAGNOSIS — I2510 Atherosclerotic heart disease of native coronary artery without angina pectoris: Secondary | ICD-10-CM

## 2022-12-09 DIAGNOSIS — I10 Essential (primary) hypertension: Secondary | ICD-10-CM

## 2022-12-09 DIAGNOSIS — E785 Hyperlipidemia, unspecified: Secondary | ICD-10-CM

## 2022-12-10 ENCOUNTER — Encounter (HOSPITAL_COMMUNITY)
Admission: RE | Admit: 2022-12-10 | Discharge: 2022-12-10 | Disposition: A | Discharge status: Home / Self Care | Payer: PPO | Source: Ambulatory Visit | Attending: Cardiology | Admitting: Cardiology

## 2022-12-10 DIAGNOSIS — Z955 Presence of coronary angioplasty implant and graft: Secondary | ICD-10-CM

## 2022-12-12 ENCOUNTER — Other Ambulatory Visit: Payer: Self-pay | Admitting: Cardiology

## 2022-12-12 ENCOUNTER — Encounter (HOSPITAL_COMMUNITY)
Admission: RE | Admit: 2022-12-12 | Discharge: 2022-12-12 | Disposition: A | Discharge status: Home / Self Care | Payer: PPO | Source: Ambulatory Visit | Attending: Cardiology | Admitting: Cardiology

## 2022-12-12 DIAGNOSIS — Z955 Presence of coronary angioplasty implant and graft: Secondary | ICD-10-CM | POA: Diagnosis not present

## 2022-12-15 ENCOUNTER — Encounter (HOSPITAL_COMMUNITY)
Admission: RE | Admit: 2022-12-15 | Discharge: 2022-12-15 | Disposition: A | Discharge status: Home / Self Care | Payer: PPO | Source: Ambulatory Visit | Attending: Cardiology | Admitting: Cardiology

## 2022-12-15 DIAGNOSIS — Z955 Presence of coronary angioplasty implant and graft: Secondary | ICD-10-CM | POA: Diagnosis not present

## 2022-12-17 ENCOUNTER — Encounter (HOSPITAL_COMMUNITY)
Admission: RE | Admit: 2022-12-17 | Discharge: 2022-12-17 | Disposition: A | Discharge status: Home / Self Care | Payer: PPO | Source: Ambulatory Visit | Attending: Cardiology | Admitting: Cardiology

## 2022-12-17 DIAGNOSIS — Z955 Presence of coronary angioplasty implant and graft: Secondary | ICD-10-CM

## 2022-12-19 ENCOUNTER — Encounter (HOSPITAL_COMMUNITY)
Admission: RE | Admit: 2022-12-19 | Discharge: 2022-12-19 | Disposition: A | Discharge status: Home / Self Care | Payer: PPO | Source: Ambulatory Visit | Attending: Cardiology | Admitting: Cardiology

## 2022-12-19 VITALS — Ht 65.0 in | Wt 170.2 lb

## 2022-12-19 DIAGNOSIS — R079 Chest pain, unspecified: Secondary | ICD-10-CM

## 2022-12-19 DIAGNOSIS — Z955 Presence of coronary angioplasty implant and graft: Secondary | ICD-10-CM

## 2022-12-22 ENCOUNTER — Encounter (HOSPITAL_COMMUNITY)
Admission: RE | Admit: 2022-12-22 | Discharge: 2022-12-22 | Disposition: A | Discharge status: Home / Self Care | Payer: PPO | Source: Ambulatory Visit | Attending: Cardiology | Admitting: Cardiology

## 2022-12-22 DIAGNOSIS — Z955 Presence of coronary angioplasty implant and graft: Secondary | ICD-10-CM | POA: Diagnosis not present

## 2022-12-23 ENCOUNTER — Telehealth: Payer: Self-pay | Admitting: Internal Medicine

## 2022-12-23 NOTE — Telephone Encounter (Signed)
Contacted [name] to schedule their annual wellness visit. Appointment made for 01/05/2023.  [facility] Care Guide [facility] AWV TEAM Direct Dial: [phone]

## 2022-12-24 ENCOUNTER — Encounter (HOSPITAL_COMMUNITY)
Admission: RE | Admit: 2022-12-24 | Discharge: 2022-12-24 | Disposition: A | Discharge status: Home / Self Care | Payer: PPO | Source: Ambulatory Visit | Attending: Cardiology | Admitting: Cardiology

## 2022-12-24 DIAGNOSIS — Z955 Presence of coronary angioplasty implant and graft: Secondary | ICD-10-CM | POA: Diagnosis not present

## 2022-12-26 ENCOUNTER — Encounter (HOSPITAL_COMMUNITY): Payer: PPO

## 2022-12-29 ENCOUNTER — Ambulatory Visit: Payer: PPO | Admitting: Cardiology

## 2023-01-01 NOTE — Progress Notes (Signed)
Discharge Progress Report  Patient Details  Name: [name] MRN: [id number] Date of Birth: 05-18-1941 Referring Provider:   Flowsheet Row INTENSIVE CARDIAC REHAB ORIENT from 10/16/2022 in [facility]  Referring Provider Dr. Rollene Rotunda, MD        Number of Visits: 54  Reason for Discharge:  Patient reached a stable level of exercise. Patient independent in their exercise. Patient has met program and personal goals.  Smoking History:  Social History   Tobacco Use  Smoking Status Former   Types: Cigarettes   Quit date: 08/25/1961   Years since quitting: 61.4   Passive exposure: Never  Smokeless Tobacco Never    Diagnosis:  09/03/22 S/P DES Ostial and Proximal LAD  ADL UCSD:   Initial Exercise Prescription:  Initial Exercise Prescription - 10/16/22 1400       Date of Initial Exercise RX and Referring Provider   Date 10/16/22    Referring Provider Dr. Rollene Rotunda, MD    Expected Discharge Date 12/26/22      Arm Ergometer   Level 1    RPM 40    Minutes 15    METs 1.4      Track   Laps 15    Minutes 15    METs 1.6      Prescription Details   Frequency (times per week) 3    Duration Progress to 30 minutes of continuous aerobic without signs/symptoms of physical distress      Intensity   THRR 40-80% of Max Heartrate 54-109    Ratings of Perceived Exertion 11-13    Perceived Dyspnea 0-4      Progression   Progression Continue progressive overload as per policy without signs/symptoms or physical distress.      Resistance Training   Training Prescription Yes    Weight 3    Reps 10-15             Discharge Exercise Prescription (Final Exercise Prescription Changes):  Exercise Prescription Changes - 12/24/22 1600       Response to Exercise   Blood Pressure (Admit) 122/80    Blood Pressure (Exercise) 140/80    Blood Pressure (Exit) 108/60    Heart Rate (Admit) 59 bpm     Heart Rate (Exercise) 106 bpm    Heart Rate (Exit) 67 bpm    Rating of Perceived Exertion (Exercise) 12.5    Perceived Dyspnea (Exercise) 0    Symptoms none    Comments Pt last day in CRP2 program    Duration Continue with 30 min of aerobic exercise without signs/symptoms of physical distress.    Intensity THRR unchanged      Progression   Progression Continue to progress workloads to maintain intensity without signs/symptoms of physical distress.    Average METs 3.82      Resistance Training   Training Prescription No      Arm Ergometer   Level 2.5    Watts 34    RPM 82    Minutes 15    METs 4.1      Track   Laps 20    Minutes 15    METs 3.55      Home Exercise Plan   Plans to continue exercise at [employer] (comment)    Frequency Add 3 additional days to program exercise sessions.    Initial Home Exercises Provided 11/14/22  Functional Capacity:  6 Minute Walk     Row Name 10/16/22 1435 12/19/22 1541       6 Minute Walk   Phase Initial Discharge    Distance 1040 feet 1965 feet    Distance % Change -- 88.94 %    Distance Feet Change -- 925 ft    Walk Time 6 minutes 6 minutes    # of Rest Breaks 0 0    MPH 1.97 3.72    METS 1.53 3.32    RPE 12 13    Perceived Dyspnea  1 0    VO2 Peak 5.35 11.62    Symptoms Yes (comment) No    Comments Chest fullness at end of walk test-resolved with rest --    Resting HR 55 bpm 50 bpm    Resting BP 132/62 122/64    Resting Oxygen Saturation  96 % --    Exercise Oxygen Saturation  during 6 min walk 96 % --    Max Ex. HR 87 bpm 97 bpm    Max Ex. BP 144/72 150/72    2 Minute Post BP 120/68 --             Psychological, QOL, Others - Outcomes: PHQ 2/9:    01/05/2023    3:20 PM 12/24/2022    1:52 PM 10/16/2022    1:20 PM 10/16/2022   11:22 AM 03/21/2022    9:53 AM  Depression screen PHQ 2/9  Decreased Interest 0 0 0 0 0  Down, Depressed, Hopeless 0 0 0 0 0  PHQ - 2 Score 0 0 0 0 0  Altered  sleeping 0 0 0 0   Tired, decreased energy 0 0 0 0   Change in appetite 0 0 0 0   Feeling bad or failure about yourself  0 0 0 0   Trouble concentrating 0 0 0 0   Moving slowly or fidgety/restless 0 0 0 0   Suicidal thoughts 0 0 0 0   PHQ-9 Score 0 0 0 0   Difficult doing work/chores Not difficult at all Not difficult at all Not difficult at all      Quality of Life:  Quality of Life - 12/24/22 1416       Quality of Life   Select Quality of Life      Quality of Life Scores   Health/Function Post 29.67 %    Socioeconomic Post 30 %    Psych/Spiritual Post 30 %    Family Post 27.6 %    GLOBAL Post 29.5 %             Personal Goals: Goals established at orientation with interventions provided to work toward goal.  Personal Goals and Risk Factors at Admission - 10/16/22 1451       Core Components/Risk Factors/Patient Goals on Admission    Weight Management Yes    Intervention Weight Management: Develop a combined nutrition and exercise program designed to reach desired caloric intake, while maintaining appropriate intake of nutrient and fiber, sodium and fats, and appropriate energy expenditure required for the weight goal.;Weight Management: Provide education and appropriate resources to help participant work on and attain dietary goals.    Expected Outcomes Short Term: Continue to assess and modify interventions until short term weight is achieved;Long Term: Adherence to nutrition and physical activity/exercise program aimed toward attainment of established weight goal    Improve shortness of breath with ADL's Yes    Intervention Provide education, individualized  exercise plan and daily activity instruction to help decrease symptoms of SOB with activities of daily living.    Expected Outcomes Short Term: Improve cardiorespiratory fitness to achieve a reduction of symptoms when performing ADLs;Long Term: Be able to perform more ADLs without symptoms or delay the onset of  symptoms    Hypertension Yes    Intervention Provide education on lifestyle modifcations including regular physical activity/exercise, weight management, moderate sodium restriction and increased consumption of fresh fruit, vegetables, and low fat dairy, alcohol moderation, and smoking cessation.;Monitor prescription use compliance.    Expected Outcomes Short Term: Continued assessment and intervention until BP is < 140/68mm HG in hypertensive participants. < 130/71mm HG in hypertensive participants with diabetes, heart failure or chronic kidney disease.;Long Term: Maintenance of blood pressure at goal levels.    Lipids Yes    Intervention Provide education and support for participant on nutrition & aerobic/resistive exercise along with prescribed medications to achieve LDL 70mg , HDL >40mg .    Expected Outcomes Short Term: Participant states understanding of desired cholesterol values and is compliant with medications prescribed. Participant is following exercise prescription and nutrition guidelines.;Long Term: Cholesterol controlled with medications as prescribed, with individualized exercise RX and with personalized nutrition plan. Value goals: LDL < 70mg , HDL > 40 mg.    Personal Goal Other Yes    Personal Goal Short Term: Understand limits to exercise. Long term: Strength, Stamina    Intervention Will continue to monitor pt and progress WL as tolerated    Expected Outcomes Pt will acheive goals              Personal Goals Discharge:  Goals and Risk Factor Review     Row Name 11/13/22 0742 12/05/22 1732           Core Components/Risk Factors/Patient Goals Review   Personal Goals Review Weight Management/Obesity;Hypertension;Lipids Weight Management/Obesity;Hypertension;Lipids      Review Gabriel Rung is doing well with exercise at intensive cardiac rehab. Vital signs have been stable. Joe continues to do  well with exercise at intensive cardiac rehab. Vital signs have been stable.       Expected Outcomes Joe will continue to participate in intensive cardiac rehab for exercise, nutrition and lifestyle modifications Joe will continue to participate in intensive cardiac rehab for exercise, nutrition and lifestyle modifications               Exercise Goals and Review:  Exercise Goals     Row Name 10/16/22 1445             Exercise Goals   Increase Physical Activity Yes       Intervention Provide advice, education, support and counseling about physical activity/exercise needs.;Develop an individualized exercise prescription for aerobic and resistive training based on initial evaluation findings, risk stratification, comorbidities and participant's personal goals.       Expected Outcomes Short Term: Attend rehab on a regular basis to increase amount of physical activity.;Long Term: Exercising regularly at least 3-5 days a week.;Long Term: Add in home exercise to make exercise part of routine and to increase amount of physical activity.       Increase Strength and Stamina Yes       Intervention Provide advice, education, support and counseling about physical activity/exercise needs.;Develop an individualized exercise prescription for aerobic and resistive training based on initial evaluation findings, risk stratification, comorbidities and participant's personal goals.       Expected Outcomes Short Term: Increase workloads from initial exercise prescription for resistance, speed,  and METs.;Short Term: Perform resistance training exercises routinely during rehab and add in resistance training at home;Long Term: Improve cardiorespiratory fitness, muscular endurance and strength as measured by increased METs and functional capacity ( )       Able to understand and use rate of perceived exertion (RPE) scale Yes       Intervention Provide education and explanation on how to use RPE scale       Expected Outcomes Short Term: Able to use RPE daily in rehab to express subjective  intensity level;Long Term:  Able to use RPE to guide intensity level when exercising independently       Able to understand and use Dyspnea scale Yes       Intervention Provide education and explanation on how to use Dyspnea scale       Expected Outcomes Short Term: Able to use Dyspnea scale daily in rehab to express subjective sense of shortness of breath during exertion;Long Term: Able to use Dyspnea scale to guide intensity level when exercising independently       Knowledge and understanding of Target Heart Rate Range (THRR) Yes       Intervention Provide education and explanation of THRR including how the numbers were predicted and where they are located for reference       Expected Outcomes Short Term: Able to state/look up THRR;Long Term: Able to use THRR to govern intensity when exercising independently;Short Term: Able to use daily as guideline for intensity in rehab       Understanding of Exercise Prescription Yes       Intervention Provide education, explanation, and written materials on patient's individual exercise prescription       Expected Outcomes Short Term: Able to explain program exercise prescription;Long Term: Able to explain home exercise prescription to exercise independently                Exercise Goals Re-Evaluation:  Exercise Goals Re-Evaluation     Row Name 10/22/22 1650 11/12/22 1633 12/01/22 1630 12/22/22 1542 12/24/22 1632     Exercise Goal Re-Evaluation   Exercise Goals Review Increase Physical Activity;Understanding of Exercise Prescription;Increase Strength and Stamina;Knowledge and understanding of Target Heart Rate Range (THRR);Able to understand and use rate of perceived exertion (RPE) scale Increase Physical Activity;Understanding of Exercise Prescription;Increase Strength and Stamina;Knowledge and understanding of Target Heart Rate Range (THRR);Able to understand and use rate of perceived exertion (RPE) scale Increase Physical Activity;Understanding of  Exercise Prescription;Increase Strength and Stamina;Knowledge and understanding of Target Heart Rate Range (THRR);Able to understand and use rate of perceived exertion (RPE) scale Increase Physical Activity;Understanding of Exercise Prescription;Increase Strength and Stamina;Knowledge and understanding of Target Heart Rate Range (THRR);Able to understand and use rate of perceived exertion (RPE) scale Increase Physical Activity;Increase Strength and Stamina;Knowledge and understanding of Target Heart Rate Range (THRR);Able to understand and use rate of perceived exertion (RPE) scale;Understanding of Exercise Prescription   Comments Pt first day in the CRP2 program. Pt tolerated exercise well with an average MET level of 2.62. Pt is learning his THRR, RPE and ExRx. Off to a great start Reviewed MET's and goals. Pt tolerated exercise well with an average MET level of 3.22. Pt feels good about his goals of gaining strenght and stamina. He is progressing well and feels good. Will review home Exercise soon Reviewed MET's with patient today. Pt tolerated exercise well with an average MET level of 3.57. Pt is feeling good about the progress he is making Patient scheduled to complete the  cardiac rehab program on 12/24/22. Patient states he learned a lot from participation in the CR program. Patient plans to continue exericise at least 30 minutes, at least 3 days/week at the [facility]. Patient plans to walk the indoor track, aerobic machines, and weight machines as his mode of exercise. Pt last day in CRP2 program. Pt completed 54 sessions and reached a peak MET avg of 3.82. He improved on his by 88.94% and 925 ft. Pt also showed marked improvements in knowledge score and QOL. Overall pt states he is feeling much better with improved energy, strength and stamina. Pt showed great progress in the program and will continue to exercise at the [facility] with his wife by walking 3-6 days per week 30-45 min per session.   Expected  Outcomes Will continue to monitor pt and progress workloads as tolerated without sign or symptom Will continue to monitor pt and progress workloads as tolerated without sign or symptom Will continue to monitor pt and progress workloads as tolerated without sign or symptom Patient will exercise at least 30 minutes, at least 3 days/week to maintain health and fitness gains. Pt will continue to exercise on their own safely following parameters.            Nutrition & Weight - Outcomes:  Pre Biometrics - 10/16/22 1124       Pre Biometrics   Waist Circumference 40 inches    Hip Circumference 39 inches    Waist to Hip Ratio 1.03 %    Triceps Skinfold 16 mm    % Body Fat 29.2 %    Grip Strength 35 kg    Flexibility 5 in    Single Leg Stand 12 seconds             Post Biometrics - 12/19/22 1543        Post  Biometrics   Height 5\' 5"  (1.651 m)    Weight 77.2 kg    Waist Circumference 38.5 inches   -1.5%   Hip Circumference 40 inches   +0.5%   Waist to Hip Ratio 0.96 %    BMI (Calculated) 28.32    Triceps Skinfold 15 mm    % Body Fat 28 %   -4.11%   Grip Strength 35 kg    Flexibility 0 in   unable to reach   Single Leg Stand 9.38 seconds             Nutrition:  Nutrition Therapy & Goals - 12/19/22 1559       Nutrition Therapy   Diet heart healthy diet    Drug/Food Interactions Statins/Certain Fruits      Personal Nutrition Goals   Nutrition Goal Patient to identify strategies for reducing cardiovascular risk by attending the weekly [facility] education and nutrition series    Personal Goal #2 Patient to improve diet quality by using the plate method as a daily guide for meal planning to include lean protein/plant protein, fruits, vegetables, whole grains, nonfat dairy as part of well balanced diet    Personal Goal #3 Patient to reduce sodium to 1500mg  per day    Comments Goals in action. Joe regularly attends the [facility] education and nutrition series. He has  implemented many dietary changes including choosing fish regularly, increased dietary fiber, and reduced sodium/sugar/saturated fat. Joe will benefit from participation in intensive cardiac rehab for nutrition, exercise, and lifestyle modification.      Intervention Plan   Intervention Prescribe, educate and counsel regarding individualized specific  dietary modifications aiming towards targeted core components such as weight, hypertension, lipid management, diabetes, heart failure and other comorbidities.;Nutrition handout(s) given to patient.    Expected Outcomes Short Term Goal: Understand basic principles of dietary content, such as calories, fat, sodium, cholesterol and nutrients.;Long Term Goal: Adherence to prescribed nutrition plan.             Nutrition Discharge:   Education Questionnaire Score:  Knowledge Questionnaire Score - 12/24/22 1416       Knowledge Questionnaire Score   Post Score 24/24             Goals reviewed with patient; copy given to patient.Pt graduates from  Intensive/Traditional cardiac rehab program 12/24/22  with completion of  55  exercise and education sessions. Pt maintained good attendance and progressed nicely during their participation in rehab as evidenced by increased MET level.   Medication list reconciled. Repeat  PHQ score- 0 .  Pt has made significant lifestyle changes and should be commended for their success. Joe increased his distance on his post exercise walk test by 925 feet.  Joe achieved their goals during cardiac rehab.   Pt plans to continue exercise at the [facility] with his wife 3-6 days a week. Joe says he feels stronger since participating in the program. We are proud of Joe's progress! Thayer Headings RN BSN

## 2023-01-03 NOTE — Progress Notes (Unsigned)
Cardiology Office Note:   Date:  01/03/2023  ID:  [name], DOB 1941/01/09, MRN [id number]  History of Present Illness:   [name] is a 82 y.o. male who presents for follow up of his CAD.  He had BMS to RCA in 10/2000, DES to 1st Diag and PTCA to LCX in 06/2005, and most recently a complex bifurcation two vessel PCI with DES to LAD and DES to ostial 1st Diag in 08/2022.   He presents for follow up.  ***     *** He was added to my schedule today because of chest pain he has started to have with exertion.  He was in the emergency room yesterday and I reviewed these records.  His pain started Friday night.  He was exerting himself.  Since that time he has had discomfort walking mild to moderate distance on level ground.  In fact leaving the emergency room he had some discomfort walking to his car.  He has not had any resting unprovoked symptoms and he has not had any today.  He said it was 7 out of 10.  It felt like his previous angina.  There was no radiation to his jaw or to his arms.  He was short of breath.  It was mid chest.  There was no associated nausea vomiting or diaphoresis.  On Friday night he did take a nitroglycerin.  His blood pressure was very elevated at that time in the 170s systolic and came down to the 595G.  It was elevated when he was in the ER.  His enzymes were negative.  There were no acute EKG changes.  He was also having headache and had head CT with no acute process.  Chest CT demonstrated no acute cardiopulmonary process although there is a large hiatal hernia.  ROS: ***  Studies Reviewed:    EKG:  ***  ***  Risk Assessment/Calculations:   {Does this patient have ATRIAL FIBRILLATION?:[phone]} No BP recorded.  {Refresh Note OR Click here to enter BP  :1}***        Physical Exam:   VS:  There were no vitals taken for this visit.   Wt Readings from Last 3 Encounters:  12/19/22 170 lb 3.1 oz (77.2 kg)  11/25/22 170 lb 6.4 oz (77.3 kg)  10/21/22 170  lb (77.1 kg)     GEN: Well nourished, well developed in no acute distress NECK: No JVD; No carotid bruits CARDIAC: ***RRR, no murmurs, rubs, gallops RESPIRATORY:  Clear to auscultation without rales, wheezing or rhonchi  ABDOMEN: Soft, non-tender, non-distended EXTREMITIES:  No edema; No deformity   ASSESSMENT AND PLAN:   CAD:  ***   History of multiple PCIs - BMS to RCA in 10/2000, DES to 1st Diag and PTCA to LCX in 06/2005, and most recently a complex bifurcation two vessel PCI with DES to LAD and DES to ostial 1st Diag in 08/2022. Imdur was increased at visit in 09/2022 due to one episode of chest pain while working with cardiac rehab. - Doing great. No recurrent chest pain.  - Continue antianginals: Amlodipine 10mg  daily, Imdur 60mg  daily, and Atenolol 50mg  twice daily.  - Continue DAPT with Aspirin and Plavix.  - Continue high-intensity statin.   Hypertension:   ***   BP well controlled.  - Continue current medications: Amlodipine 10mg  daily, Imdur 60mg  daily, Atenolol 50mg  twice daily, Lisinopril 10mg  daily, and Hydralazine 25mg  three times daily.   Hyperlipidemia:   ***   Lipid panel  in 12/2021: Total Cholesterol 123, Triglycerides 119, HDL 48.40, LDL 51.  - Continue Lipitor 40mg  daily.  - Will repeat lipid panel and CMET today.   Iron Deficiency Anemia:  ***   Patient states his RBCs and hemoglobin have been low in the past. Upon chart review, looks like he has iron deficiency anemia. Hemoglobin 12.1 in 08/2022. Iron normal but Ferritin low at 17.6 at that time. He is taking an iron supplement.  - Will recheck CBC today per patient's request since we are already getting other labs.    {Are you ordering a CV Procedure (e.g. stress test, cath, DCCV, TEE, etc)?   Press F2        :[id number]}   Signed, Rollene Rotunda, MD

## 2023-01-05 ENCOUNTER — Encounter: Payer: Self-pay | Admitting: Cardiology

## 2023-01-05 ENCOUNTER — Ambulatory Visit (INDEPENDENT_AMBULATORY_CARE_PROVIDER_SITE_OTHER): Payer: PPO

## 2023-01-05 ENCOUNTER — Encounter: Payer: Self-pay | Admitting: Internal Medicine

## 2023-01-05 ENCOUNTER — Ambulatory Visit: Payer: PPO | Attending: Cardiology | Admitting: Cardiology

## 2023-01-05 ENCOUNTER — Ambulatory Visit (INDEPENDENT_AMBULATORY_CARE_PROVIDER_SITE_OTHER): Payer: PPO | Admitting: Internal Medicine

## 2023-01-05 VITALS — BP 104/60 | HR 66 | Temp 98.0°F | Ht 66.0 in | Wt 169.0 lb

## 2023-01-05 VITALS — BP 112/60 | HR 80 | Ht 66.0 in | Wt 168.6 lb

## 2023-01-05 VITALS — BP 116/82 | HR 66 | Temp 98.0°F | Resp 16 | Ht 66.0 in | Wt 169.0 lb

## 2023-01-05 DIAGNOSIS — N1832 Chronic kidney disease, stage 3b: Secondary | ICD-10-CM

## 2023-01-05 DIAGNOSIS — R197 Diarrhea, unspecified: Secondary | ICD-10-CM | POA: Insufficient documentation

## 2023-01-05 DIAGNOSIS — E785 Hyperlipidemia, unspecified: Secondary | ICD-10-CM

## 2023-01-05 DIAGNOSIS — I2510 Atherosclerotic heart disease of native coronary artery without angina pectoris: Secondary | ICD-10-CM | POA: Diagnosis not present

## 2023-01-05 DIAGNOSIS — Z0000 Encounter for general adult medical examination without abnormal findings: Secondary | ICD-10-CM

## 2023-01-05 DIAGNOSIS — I10 Essential (primary) hypertension: Secondary | ICD-10-CM | POA: Diagnosis not present

## 2023-01-05 LAB — BASIC METABOLIC PANEL
BUN: 36 mg/dL — ABNORMAL HIGH (ref 6–23)
CO2: 22 mEq/L (ref 19–32)
Calcium: 8.6 mg/dL (ref 8.4–10.5)
Chloride: 107 mEq/L (ref 96–112)
Creatinine, Ser: 1.29 mg/dL (ref 0.40–1.50)
GFR: 50.98 mL/min — ABNORMAL LOW (ref >60.00)
Glucose, Bld: 118 mg/dL — ABNORMAL HIGH (ref 70–99)
Potassium: 4.6 mEq/L (ref 3.5–5.1)
Sodium: 136 mEq/L (ref 135–145)

## 2023-01-05 LAB — CBC WITH DIFFERENTIAL/PLATELET
Basophils Absolute: 0 10*3/uL (ref 0.0–0.1)
Basophils Relative: 0.2 % (ref 0.0–3.0)
Eosinophils Absolute: 0 10*3/uL (ref 0.0–0.7)
Eosinophils Relative: 0.1 % (ref 0.0–5.0)
HCT: 37.4 % — ABNORMAL LOW (ref 39.0–52.0)
Hemoglobin: 12.6 g/dL — ABNORMAL LOW (ref 13.0–17.0)
Lymphocytes Relative: 12 % (ref 12.0–46.0)
Lymphs Abs: 1.7 10*3/uL (ref 0.7–4.0)
MCHC: 33.5 g/dL (ref 30.0–36.0)
MCV: 95.9 fl (ref 78.0–100.0)
Monocytes Absolute: 1.3 10*3/uL — ABNORMAL HIGH (ref 0.1–1.0)
Monocytes Relative: 9.1 % (ref 3.0–12.0)
Neutro Abs: 10.9 10*3/uL — ABNORMAL HIGH (ref 1.4–7.7)
Neutrophils Relative %: 78.6 % — ABNORMAL HIGH (ref 43.0–77.0)
Platelets: 239 10*3/uL (ref 150.0–400.0)
RBC: 3.9 Mil/uL — ABNORMAL LOW (ref 4.22–5.81)
RDW: 13.8 % (ref 11.5–15.5)
WBC: 13.9 10*3/uL — ABNORMAL HIGH (ref 4.0–10.5)

## 2023-01-05 LAB — TSH: TSH: 0.69 u[IU]/mL (ref 0.35–5.50)

## 2023-01-05 MED ORDER — ATORVASTATIN CALCIUM 80 MG PO TABS: 80.0000 mg | ORAL_TABLET | Freq: Every day | ORAL | 3 refills | Status: DC

## 2023-01-05 NOTE — Patient Instructions (Signed)
Diarrhea, Adult Diarrhea is frequent loose and sometimes watery bowel movements. Diarrhea can make you feel weak and cause you to become dehydrated. Dehydration is a condition in which there is not enough water or other fluids in the body. Dehydration can make you tired and thirsty, cause you to have a dry mouth, and decrease how often you urinate. Diarrhea typically lasts 2-3 days. However, it can last longer if it is a sign of something more serious. It is important to treat your diarrhea as told by your health care provider. Follow these instructions at home: Eating and drinking     Follow these recommendations as told by your health care provider: Take an oral rehydration solution (ORS). This is an over-the-counter medicine that helps return your body to its normal balance of nutrients and water. It is found at pharmacies and retail stores. Drink enough fluid to keep your urine pale yellow. Drink fluids such as water, diluted fruit juice, and low-calorie sports drinks. You can drink milk also, if desired. Sucking on ice chips is another way to get fluids. Avoid drinking fluids that contain a lot of sugar or caffeine, such as soda, energy drinks, and regular sports drinks. Avoid alcohol. Eat bland, easy-to-digest foods in small amounts as you are able. These foods include bananas, applesauce, rice, lean meats, toast, and crackers. Avoid spicy or fatty foods.  Medicines Take over-the-counter and prescription medicines only as told by your health care provider. If you were prescribed antibiotics, take them as told by your health care provider. Do not stop using the antibiotic even if you start to feel better. General instructions  Wash your hands often using soap and water for at least 20 seconds. If soap and water are not available, use hand sanitizer. Others in the household should wash their hands as well. Hands should be washed: After using the toilet or changing a diaper. Before  preparing, cooking, or serving food. While caring for a sick person or while visiting someone in a hospital. Rest at home while you recover. Take a warm bath to relieve any burning or pain from frequent diarrhea episodes. Watch your condition for any changes. Contact a health care provider if: You have a fever. Your diarrhea gets worse. You have new symptoms. You vomit every time you eat or drink. You feel light-headed, dizzy, or have a headache. You have muscle cramps. You have signs of dehydration, such as: Dark urine, very little urine, or no urine. Cracked lips. Dry mouth. Sunken eyes. Sleepiness. Weakness. You have bloody or black stools or stools that look like tar. You have severe pain, cramping, or bloating in your abdomen. Your skin feels cold and clammy. You feel confused. Get help right away if: You have chest pain or your heart is beating very quickly. You have trouble breathing or you are breathing very quickly. You feel extremely weak or you faint. These symptoms may be an emergency. Get help right away. Call 911. Do not wait to see if the symptoms will go away. Do not drive yourself to the hospital. This information is not intended to replace advice given to you by your health care provider. Make sure you discuss any questions you have with your health care provider. Document Revised: 01/28/2022 Document Reviewed: 01/28/2022 Elsevier Patient Education  2023 Elsevier Inc.  

## 2023-01-05 NOTE — Patient Instructions (Addendum)
[name] , Thank you for taking time to come for your Medicare Wellness Visit. I appreciate your ongoing commitment to your health goals. Please review the following plan we discussed and let me know if I can assist you in the future.   These are the goals we discussed:  Goals      My goal for 2024 is to continue to exercise, eat healthy and maintain my health.        This is a list of the screening recommended for you and due dates:  Health Maintenance  Topic Date Due   COVID-19 Vaccine (6 - 2023-24 season) 07/11/2022   Flu Shot  03/26/2023   Medicare Annual Wellness Visit  01/05/2024   DTaP/Tdap/Td vaccine (3 - Td or Tdap) 09/22/2026   Pneumonia Vaccine  Completed   Zoster (Shingles) Vaccine  Completed   HPV Vaccine  Aged Out    Advanced directives: Yes  Conditions/risks identified: Yes  Next appointment: Follow up in one year for your annual wellness visit.   Preventive Care 2 Years and Older, Male  Preventive care refers to lifestyle choices and visits with your health care provider that can promote health and wellness. What does preventive care include? A yearly physical exam. This is also called an annual well check. Dental exams once or twice a year. Routine eye exams. Ask your health care provider how often you should have your eyes checked. Personal lifestyle choices, including: Daily care of your teeth and gums. Regular physical activity. Eating a healthy diet. Avoiding tobacco and drug use. Limiting alcohol use. Practicing safe sex. Taking low doses of aspirin every day. Taking vitamin and mineral supplements as recommended by your health care provider. What happens during an annual well check? The services and screenings done by your health care provider during your annual well check will depend on your age, overall health, lifestyle risk factors, and family history of disease. Counseling  Your health care provider may ask you questions about  your: Alcohol use. Tobacco use. Drug use. Emotional well-being. Home and relationship well-being. Sexual activity. Eating habits. History of falls. Memory and ability to understand (cognition). Work and work Astronomer. Screening  You may have the following tests or measurements: Height, weight, and BMI. Blood pressure. Lipid and cholesterol levels. These may be checked every 5 years, or more frequently if you are over 56 years old. Skin check. Lung cancer screening. You may have this screening every year starting at age 44 if you have a 30-pack-year history of smoking and currently smoke or have quit within the past 15 years. Fecal occult blood test (FOBT) of the stool. You may have this test every year starting at age 55. Flexible sigmoidoscopy or colonoscopy. You may have a sigmoidoscopy every 5 years or a colonoscopy every 10 years starting at age 32. Prostate cancer screening. Recommendations will vary depending on your family history and other risks. Hepatitis C blood test. Hepatitis B blood test. Sexually transmitted disease (STD) testing. Diabetes screening. This is done by checking your blood sugar (glucose) after you have not eaten for a while (fasting). You may have this done every 1-3 years. Abdominal aortic aneurysm (AAA) screening. You may need this if you are a current or former smoker. Osteoporosis. You may be screened starting at age 41 if you are at high risk. Talk with your health care provider about your test results, treatment options, and if necessary, the need for more tests. Vaccines  Your health care provider may recommend  certain vaccines, such as: Influenza vaccine. This is recommended every year. Tetanus, diphtheria, and acellular pertussis (Tdap, Td) vaccine. You may need a Td booster every 10 years. Zoster vaccine. You may need this after age 56. Pneumococcal 13-valent conjugate (PCV13) vaccine. One dose is recommended after age 37. Pneumococcal  polysaccharide (PPSV23) vaccine. One dose is recommended after age 23. Talk to your health care provider about which screenings and vaccines you need and how often you need them. This information is not intended to replace advice given to you by your health care provider. Make sure you discuss any questions you have with your health care provider. Document Released: 09/07/2015 Document Revised: 04/30/2016 Document Reviewed: 06/12/2015 Elsevier Interactive Patient Education  2017 [place] Prevention in the Home Falls can cause injuries. They can happen to people of all ages. There are many things you can do to make your home safe and to help prevent falls. What can I do on the outside of my home? Regularly fix the edges of walkways and driveways and fix any cracks. Remove anything that might make you trip as you walk through a door, such as a raised step or threshold. Trim any bushes or trees on the path to your home. Use bright outdoor lighting. Clear any walking paths of anything that might make someone trip, such as rocks or tools. Regularly check to see if handrails are loose or broken. Make sure that both sides of any steps have handrails. Any raised decks and porches should have guardrails on the edges. Have any leaves, snow, or ice cleared regularly. Use sand or salt on walking paths during winter. Clean up any spills in your garage right away. This includes oil or grease spills. What can I do in the bathroom? Use night lights. Install grab bars by the toilet and in the tub and shower. Do not use towel bars as grab bars. Use non-skid mats or decals in the tub or shower. If you need to sit down in the shower, use a plastic, non-slip stool. Keep the floor dry. Clean up any water that spills on the floor as soon as it happens. Remove soap buildup in the tub or shower regularly. Attach bath mats securely with double-sided non-slip rug tape. Do not have throw rugs and other  things on the floor that can make you trip. What can I do in the bedroom? Use night lights. Make sure that you have a light by your bed that is easy to reach. Do not use any sheets or blankets that are too big for your bed. They should not hang down onto the floor. Have a firm chair that has side arms. You can use this for support while you get dressed. Do not have throw rugs and other things on the floor that can make you trip. What can I do in the kitchen? Clean up any spills right away. Avoid walking on wet floors. Keep items that you use a lot in easy-to-reach places. If you need to reach something above you, use a strong step stool that has a grab bar. Keep electrical cords out of the way. Do not use floor polish or wax that makes floors slippery. If you must use wax, use non-skid floor wax. Do not have throw rugs and other things on the floor that can make you trip. What can I do with my stairs? Do not leave any items on the stairs. Make sure that there are handrails on both sides of the  stairs and use them. Fix handrails that are broken or loose. Make sure that handrails are as long as the stairways. Check any carpeting to make sure that it is firmly attached to the stairs. Fix any carpet that is loose or worn. Avoid having throw rugs at the top or bottom of the stairs. If you do have throw rugs, attach them to the floor with carpet tape. Make sure that you have a light switch at the top of the stairs and the bottom of the stairs. If you do not have them, ask someone to add them for you. What else can I do to help prevent falls? Wear shoes that: Do not have high heels. Have rubber bottoms. Are comfortable and fit you well. Are closed at the toe. Do not wear sandals. If you use a stepladder: Make sure that it is fully opened. Do not climb a closed stepladder. Make sure that both sides of the stepladder are locked into place. Ask someone to hold it for you, if possible. Clearly  mark and make sure that you can see: Any grab bars or handrails. First and last steps. Where the edge of each step is. Use tools that help you move around (mobility aids) if they are needed. These include: Canes. Walkers. Scooters. Crutches. Turn on the lights when you go into a dark area. Replace any light bulbs as soon as they burn out. Set up your furniture so you have a clear path. Avoid moving your furniture around. If any of your floors are uneven, fix them. If there are any pets around you, be aware of where they are. Review your medicines with your doctor. Some medicines can make you feel dizzy. This can increase your chance of falling. Ask your doctor what other things that you can do to help prevent falls. This information is not intended to replace advice given to you by your health care provider. Make sure you discuss any questions you have with your health care provider. Document Released: 06/07/2009 Document Revised: 01/17/2016 Document Reviewed: 09/15/2014 Elsevier Interactive Patient Education  2017 [employer].

## 2023-01-05 NOTE — Progress Notes (Addendum)
Subjective:  Patient ID: [name], male    DOB: 08/22/1941  Age: 82 y.o. MRN: [id number]  CC: Diarrhea and Anemia   HPI [name] presents for f/up ---  He complains of a 4-day history of diarrhea up to 8 watery bowel movements a day.  He has gotten no symptom relief with Imodium.  He denies abdominal pain, cramping, nausea, vomiting, bright red blood per rectum, or melena.  Outpatient Medications Prior to Visit  Medication Sig Dispense Refill   amLODipine (NORVASC) 10 MG tablet TAKE ONE TABLET BY MOUTH EVERYDAY AT BEDTIME 90 tablet 3   aspirin EC 81 MG tablet Take 81 mg by mouth daily. Swallow whole.     atenolol (TENORMIN) 50 MG tablet Take 50 mg by mouth 2 (two) times daily.     atorvastatin (LIPITOR) 80 MG tablet Take 1 tablet (80 mg total) by mouth at bedtime. 90 tablet 3   clopidogrel (PLAVIX) 75 MG tablet Take 1 tablet (75 mg total) by mouth daily with breakfast. 90 tablet 2   Coenzyme Q10 (CO Q 10 PO) Take 1 tablet by mouth daily.     hydrALAZINE (APRESOLINE) 25 MG tablet Take 1 tablet (25 mg total) by mouth 3 (three) times daily. 180 tablet 0   isosorbide mononitrate (IMDUR) 60 MG 24 hr tablet TAKE 1 TABLET BY MOUTH EVERY DAY 90 tablet 1   lidocaine (HM LIDOCAINE PATCH) 4 % Place 1 patch onto the skin as needed.     lisinopril (ZESTRIL) 10 MG tablet TAKE 1 TABLET BY MOUTH AT BEDTIME 90 tablet 1   Multiple Vitamins-Minerals (PRESERVISION AREDS 2) CAPS Take 1 capsule by mouth 2 (two) times daily.     nitroGLYCERIN (NITROSTAT) 0.4 MG SL tablet DISSOLVE 1 TABLET UNDER THE TONGUE EVERY 5 MINUTES AS NEEDED FOR CHEST PAIN. DO NOT EXCEED A TOTAL OF 3 DOSES IN 15 MINUTES. 25 tablet 2   pantoprazole (PROTONIX) 40 MG tablet TAKE ONE TABLET BY MOUTH EVERY MORNING 90 tablet 0   Polyethyl Glycol-Propyl Glycol (SYSTANE) 0.4-0.3 % SOLN Place 2 drops into both eyes every morning.     primidone (MYSOLINE) 50 MG tablet TAKE ONE TABLET BY MOUTH EVERY MORNING and TAKE ONE TABLET BY  MOUTH EVERYDAY AT BEDTIME 180 tablet 0   KRILL OIL PO Take 100 mg by mouth daily. Mega Red     Multiple Vitamins-Minerals (CENTRUM SILVER 50+MEN PO) Take 1 tablet by mouth.     No facility-administered medications prior to visit.    ROS Review of Systems  Constitutional: Negative.  Negative for appetite change, chills, fatigue and fever.  HENT: Negative.    Eyes: Negative.   Respiratory: Negative.  Negative for cough, chest tightness, shortness of breath and wheezing.   Cardiovascular:  Negative for chest pain, palpitations and leg swelling.  Gastrointestinal:  Positive for diarrhea. Negative for abdominal pain, blood in stool, constipation, nausea and vomiting.  Endocrine: Negative.   Genitourinary: Negative.  Negative for difficulty urinating.  Musculoskeletal: Negative.   Skin: Negative.  Negative for color change and rash.  Neurological:  Negative for dizziness, weakness, light-headedness and headaches.  Hematological:  Negative for adenopathy. Does not bruise/bleed easily.  Psychiatric/Behavioral: Negative.      Objective:  BP 116/82 (BP Location: Left Arm, Patient Position: Sitting, Cuff Size: Large)   Pulse 66   Temp 98 F (36.7 C) (Oral)   Resp 16   Ht 5\' 6"  (1.676 m)   Wt 169 lb (76.7 kg)  SpO2 94%   BMI 27.28 kg/m   BP Readings from Last 3 Encounters:  01/05/23 104/60  01/05/23 116/82  01/05/23 112/60    Wt Readings from Last 3 Encounters:  01/05/23 169 lb (76.7 kg)  01/05/23 169 lb (76.7 kg)  01/05/23 168 lb 9.6 oz (76.5 kg)    Physical Exam Vitals reviewed.  Constitutional:      Appearance: He is not ill-appearing.  HENT:     Nose: Nose normal.     Mouth/Throat:     Mouth: Mucous membranes are moist.  Eyes:     General: No scleral icterus.    Conjunctiva/sclera: Conjunctivae normal.  Cardiovascular:     Rate and Rhythm: Normal rate and regular rhythm.     Heart sounds: Murmur heard.     Diastolic murmur is present with a grade of 1/4.     No  gallop.     Comments: 1/6 LLSB Pulmonary:     Effort: Pulmonary effort is normal.     Breath sounds: No stridor. No wheezing, rhonchi or rales.  Abdominal:     General: Abdomen is flat.     Palpations: There is no mass.     Tenderness: There is no abdominal tenderness. There is no guarding.     Hernia: No hernia is present.  Musculoskeletal:        General: Normal range of motion.     Cervical back: Neck supple.     Right lower leg: No edema.     Left lower leg: No edema.  Lymphadenopathy:     Cervical: No cervical adenopathy.  Skin:    General: Skin is warm and dry.  Neurological:     General: No focal deficit present.     Mental Status: He is alert. Mental status is at baseline.  Psychiatric:        Mood and Affect: Mood normal.        Behavior: Behavior normal.     Lab Results  Component Value Date   WBC 13.9 (H) 01/05/2023   HGB 12.6 (L) 01/05/2023   HCT 37.4 (L) 01/05/2023   PLT 239.0 01/05/2023   GLUCOSE 118 (H) 01/05/2023   CHOL 132 11/25/2022   TRIG 108 11/25/2022   HDL 47 11/25/2022   LDLCALC 65 11/25/2022   ALT 21 11/25/2022   AST 22 11/25/2022   NA 136 01/05/2023   K 4.6 01/05/2023   CL 107 01/05/2023   CREATININE 1.29 01/05/2023   BUN 36 (H) 01/05/2023   CO2 22 01/05/2023   TSH 0.69 01/05/2023   PSA 2.62 02/15/2018    No results found.  Assessment & Plan:   Stage 3b chronic kidney disease (HCC)- His renal function is stable. -     Basic metabolic panel; Future  Essential hypertension- His blood pressure is well-controlled. -     TSH; Future  Diarrhea of presumed infectious origin- GI stool PCR panel is positive for sapovirus.  He tells me his symptoms have resolved. -     Fecal lactoferrin, quant; Future -     GI Profile, Stool, PCR; Future -     Basic metabolic panel; Future -     CBC with Differential/Platelet; Future -     TSH; Future     Follow-up: Return if symptoms worsen or fail to improve.  Sanda Linger, MD

## 2023-01-05 NOTE — Patient Instructions (Addendum)
Medication Instructions:  Your physician has recommended you make the following change in your medication:   -Increase atorvastatin (lipitor) to 80mg  once daily at bedtime.  *If you need a refill on your cardiac medications before your next appointment, please call your pharmacy*   Lab Work: Your physician recommends that you return for lab work in: 3 months for FASTING lipid panel  If you have labs (blood work) drawn today and your tests are completely normal, you will receive your results only by: MyChart Message (if you have MyChart) OR A paper copy in the mail If you have any lab test that is abnormal or we need to change your treatment, we will call you to review the results.   Follow-Up: At [facility], you and your health needs are our priority.  As part of our continuing mission to provide you with exceptional heart care, we have created designated Provider Care Teams.  These Care Teams include your primary Cardiologist (physician) and Advanced Practice Providers (APPs -  Physician Assistants and Nurse Practitioners) who all work together to provide you with the care you need, when you need it.  We recommend signing up for the patient portal called "MyChart".  Sign up information is provided on this After Visit Summary.  MyChart is used to connect with patients for Virtual Visits (Telemedicine).  Patients are able to view lab/test results, encounter notes, upcoming appointments, etc.  Non-urgent messages can be sent to your provider as well.   To learn more about what you can do with MyChart, go to [email or web address].    Your next appointment:   8 month(s) (Late January)  Provider:   Rollene Rotunda, MD

## 2023-01-05 NOTE — Progress Notes (Signed)
I connected with  [name] on 01/05/23 by a audio enabled telemedicine application and verified that I am speaking with the correct person using two identifiers.  Patient Location: Home  Provider Location: Office/Clinic  I discussed the limitations of evaluation and management by telemedicine. The patient expressed understanding and agreed to proceed.  Patient Medicare AWV questionnaire was completed by the patient on 01/04/2023; I have confirmed that all information answered by patient is correct and no changes since this date.    Subjective:   [name] is a 82 y.o. male who presents for Medicare Annual/Subsequent preventive examination.  Review of Systems     Cardiac Risk Factors include: advanced age (>87men, >29 women);dyslipidemia;family history of premature cardiovascular disease;hypertension;male gender     Objective:    Today's Vitals   01/05/23 1512 01/05/23 1518  BP: 104/60   Pulse: 66   Temp: 98 F (36.7 C)   TempSrc: Oral   SpO2: 94%   Weight: 169 lb (76.7 kg)   Height: 5\' 6"  (1.676 m)   PainSc: 1  1   PainLoc: Hip    Body mass index is 27.28 kg/m.     09/08/2022   11:11 PM 09/03/2022   10:00 PM 09/03/2022   11:39 AM 09/01/2022    6:02 PM 03/21/2022    9:50 AM 03/20/2021    2:10 PM 10/18/2019   10:35 AM  Advanced Directives  Does Patient Have a Medical Advance Directive? No Yes Yes No Yes Yes Yes  Type of Science writer of [place];Living will  Living will;Healthcare Power of Attorney Living will;Healthcare Power of Attorney   Does patient want to make changes to medical advance directive? No - Patient declined No - Guardian declined No - Patient declined  No - Patient declined No - Patient declined   Copy of Healthcare Power of Attorney in Chart?  No - copy requested No - copy requested  No - copy requested No - copy requested     Current Medications (verified) Outpatient Encounter Medications  as of 01/05/2023  Medication Sig   amLODipine (NORVASC) 10 MG tablet TAKE ONE TABLET BY MOUTH EVERYDAY AT BEDTIME   aspirin EC 81 MG tablet Take 81 mg by mouth daily. Swallow whole.   atenolol (TENORMIN) 50 MG tablet Take 50 mg by mouth 2 (two) times daily.   atorvastatin (LIPITOR) 80 MG tablet Take 1 tablet (80 mg total) by mouth at bedtime.   clopidogrel (PLAVIX) 75 MG tablet Take 1 tablet (75 mg total) by mouth daily with breakfast.   Coenzyme Q10 (CO Q 10 PO) Take 1 tablet by mouth daily.   hydrALAZINE (APRESOLINE) 25 MG tablet Take 1 tablet (25 mg total) by mouth 3 (three) times daily.   isosorbide mononitrate (IMDUR) 60 MG 24 hr tablet TAKE 1 TABLET BY MOUTH EVERY DAY   lidocaine (HM LIDOCAINE PATCH) 4 % Place 1 patch onto the skin as needed.   lisinopril (ZESTRIL) 10 MG tablet TAKE 1 TABLET BY MOUTH AT BEDTIME   Multiple Vitamins-Minerals (PRESERVISION AREDS 2) CAPS Take 1 capsule by mouth 2 (two) times daily.   nitroGLYCERIN (NITROSTAT) 0.4 MG SL tablet DISSOLVE 1 TABLET UNDER THE TONGUE EVERY 5 MINUTES AS NEEDED FOR CHEST PAIN. DO NOT EXCEED A TOTAL OF 3 DOSES IN 15 MINUTES.   pantoprazole (PROTONIX) 40 MG tablet TAKE ONE TABLET BY MOUTH EVERY MORNING   Polyethyl Glycol-Propyl Glycol (SYSTANE) 0.4-0.3 % SOLN Place 2 drops  into both eyes every morning.   primidone (MYSOLINE) 50 MG tablet TAKE ONE TABLET BY MOUTH EVERY MORNING and TAKE ONE TABLET BY MOUTH EVERYDAY AT BEDTIME   [DISCONTINUED] KRILL OIL PO Take 100 mg by mouth daily. Mega Red   [DISCONTINUED] Multiple Vitamins-Minerals (CENTRUM SILVER 50+MEN PO) Take 1 tablet by mouth.   No facility-administered encounter medications on file as of 01/05/2023.    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 - decreased dose of HCTZ to M,F   Diverticulosis    DJD (degenerative joint disease)    Duodenal ulcer    ED (erectile dysfunction)    GERD (gastroesophageal reflux disease)    increased s/s in the last week 03-22-19 PV    Hemorrhoids    Hyperlipidemia    Hypertension    Myocardial infarction (HCC)    2002   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   mid frontal scalp-[id number]   SCC (squamous cell carcinoma) 06/15/2019   front middle scalp-tx cx44fu   SCC (squamous cell carcinoma) 12/27/2002   CIS-right angle of jaw--cx56fu   SCC (squamous cell carcinoma) 09/24/2004   CIS--mid post crown, left mis crown medial,left first web space-tx cx1fu   SCC (squamous cell carcinoma) 06/03/2006   SCCA-right temple-tx cx22fu   SCC (squamous cell carcinoma) 06/03/2006   CIS-right forehead, left mis crown scalp medial-tx cx27fu   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/30/2006   right temple lateral-txpbx   SCC (squamous cell carcinoma) 11/10/2006   left forehead, left hand proximal   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/02/2009   left scalp-[id number]   SCC (squamous cell carcinoma) 01/24/2015   left scalp-[id number]   SCC (squamous cell carcinoma) 11/12/2015   front lateralscalp-cx42fu, medial scalp-cx34fu   SCC (squamous cell carcinoma) 12/17/2016   front center scalp-cx8fu   Squamous cell carcinoma of skin 08/10/2017   CIS--right ear rim, top of scalp, left side scalp-tx-cx63fu   Past Surgical History:  Procedure Laterality Date   CARDIAC CATHETERIZATION     COLONOSCOPY     CORONARY STENT INTERVENTION N/A 09/03/2022   Procedure: CORONARY STENT INTERVENTION;  Surgeon: Marykay Lex, MD;  Location: [facility] INVASIVE CV LAB;  Service: Cardiovascular;  Laterality: N/A;   ELBOW ARTHROSCOPY     heart stents     2002,2004   LEFT HEART CATH AND CORONARY ANGIOGRAPHY N/A 09/03/2022    Procedure: LEFT HEART CATH AND CORONARY ANGIOGRAPHY;  Surgeon: Marykay Lex, MD;  Location[facility] INVASIVE CV LAB;  Service: Cardiovascular;  Laterality: N/A;   SPINAL FUSION     SPINAL FUSION     SPINE SURGERY     cervical   UPPER GASTROINTESTINAL ENDOSCOPY     Family History  Problem Relation Age of Onset   Alcohol abuse Other    Coronary artery disease Other    Alzheimer's disease Other    Dementia Mother    Colon cancer Neg Hx    Esophageal cancer Neg Hx    Rectal cancer Neg Hx    Stomach cancer Neg Hx    Colon polyps Neg Hx  Social History   Socioeconomic History   Marital status: Married    Spouse name: Not on file   Number of children: 1   Years of education: 17   Highest education level: Master's degree (e.g., MA, MS, MEng, MEd, MSW, MBA)  Occupational History   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   Occupation: retired  Tobacco Use   Smoking status: Former    Types: Cigarettes    Quit date: 08/25/1961    Years since quitting: 61.4    Passive exposure: Never   Smokeless tobacco: Never  Vaping Use   Vaping Use: Never used  Substance and Sexual Activity   Alcohol use: Yes    Comment: very seldom   Drug use: No   Sexual activity: Not Currently  Other Topics Concern   Not on file  Social History Narrative   Right handed   1 Story home    live spouse   Social Determinants of Health   Financial Resource Strain: Low Risk  (01/05/2023)   Overall Financial Resource Strain (CARDIA)    Difficulty of Paying Living Expenses: Not hard at all  Food Insecurity: No Food Insecurity (01/05/2023)   Hunger Vital Sign    Worried About Running Out of Food in the Last Year: Never true    Ran Out of Food in the Last Year: Never true  Transportation Needs: No Transportation Needs (01/05/2023)   PRAPARE - Administrator, Civil Service (Medical): No    Lack of Transportation (Non-Medical): No  Physical Activity: Insufficiently Active (01/05/2023)    Exercise Vital Sign    Days of Exercise per Week: 4 days    Minutes of Exercise per Session: 30 min  Stress: No Stress Concern Present (01/05/2023)   [employer] of Occupational Health - Occupational Stress Questionnaire    Feeling of Stress : Not at all  Social Connections: Unknown (01/05/2023)   Social Connection and Isolation Panel [NHANES]    Frequency of Communication with Friends and Family: Once a week    Frequency of Social Gatherings with Friends and Family: Once a week    Attends Religious Services: Patient declined    Database administrator or Organizations: Yes    Attends Engineer, structural: More than 4 times per year    Marital Status: Married    Tobacco Counseling Counseling given: Not Answered   Clinical Intake:  Pre-visit preparation completed: Yes  Pain : 0-10 Pain Score: 1  Pain Type: Chronic pain Pain Location: Hip Pain Orientation: Right     BMI - recorded: 27.28 Nutritional Status: BMI 25 -29 Overweight Nutritional Risks: None Diabetes: No  How often do you need to have someone help you when you read instructions, pamphlets, or other written materials from your doctor or pharmacy?: 1 - Never What is the last grade level you completed in school?: HSG/Some College/College Graduate  Diabetic? No  Interpreter Needed?: No  Information entered by :: Jamesetta Greenhalgh N. Gabriella Guile, LPN.   Activities of Daily Living    01/05/2023    3:23 PM 01/04/2023    4:19 PM  In your present state of health, do you have any difficulty performing the following activities:  Hearing? 0 0  Vision? 0 0  Difficulty concentrating or making decisions? 0 0  Walking or climbing stairs? 0 0  Dressing or bathing? 0 0  Doing errands, shopping? 0 0  Preparing Food and eating ? N N  Using the Toilet? N  N  In the past six months, have you accidently leaked urine? Y Y  Do you have problems with loss of bowel control? N N  Managing your Medications? N N  Managing your  Finances? N N  Housekeeping or managing your Housekeeping? N N    Patient Care Team: Etta Grandchild, MD as PCP - General (Internal Medicine) Rollene Rotunda, MD as PCP - Cardiology (Cardiology) Janalyn Harder, MD (Inactive) as Consulting Physician (Dermatology) Tat, Octaviano Batty, DO as Consulting Physician (Neurology) Kathyrn Sheriff, [facility] as Pharmacist (Pharmacist) Duke, Roe Rutherford, PA as Physician Assistant (Cardiology) Hurshel Party, OD as Consulting Physician (Optometry)  Indicate any recent Medical Services you may have received from other than [facility] providers in the past year (date may be approximate).     Assessment:   This is a routine wellness examination for Shepard.  Hearing/Vision screen Hearing Screening - Comments:: Patient denied any hearing difficulty.   No hearing aids.  Vision Screening - Comments:: Patient does wear corrective lenses/contacts.  Eye exam done by: Dr. Cephus Richer   Dietary issues and exercise activities discussed: Current Exercise Habits: Home exercise routine;Structured exercise class, Type of exercise: walking;treadmill;stretching;strength training/weights, Time (Minutes): 30, Frequency (Times/Week): 4, Weekly Exercise (Minutes/Week): 120, Intensity: Moderate   Goals Addressed             This Visit's Progress    My goal for 2024 is to continue to exercise, eat healthy and maintain my health.        Depression Screen    01/05/2023    3:20 PM 12/24/2022    1:52 PM 10/16/2022    1:20 PM 10/16/2022   11:22 AM 03/21/2022    9:53 AM 03/20/2021    2:09 PM 03/14/2021    9:55 AM  PHQ 2/9 Scores  PHQ - 2 Score 0 0 0 0 0 0 0  PHQ- 9 Score 0 0 0 0       Fall Risk    01/05/2023    3:23 PM 01/04/2023    4:19 PM 12/24/2022    2:00 PM 12/22/2022    2:14 PM 12/19/2022    3:32 PM  Fall Risk   Falls in the past year? 0 0 0 0 0  Number falls in past yr: 0 0 0 0 0  Injury with Fall? 0 0 0 0 0  Risk for fall due to : No Fall Risks  No Fall  Risks No Fall Risks No Fall Risks  Follow up Falls prevention discussed  Falls evaluation completed Falls evaluation completed Falls evaluation completed    FALL RISK PREVENTION PERTAINING TO THE HOME:  Any stairs in or around the home? No  If so, are there any without handrails? No  Home free of loose throw rugs in walkways, pet beds, electrical cords, etc? Yes  Adequate lighting in your home to reduce risk of falls? Yes   ASSISTIVE DEVICES UTILIZED TO PREVENT FALLS:  Life alert? No  Use of a cane, walker or w/c? No  Grab bars in the bathroom? No  Shower chair or bench in shower? No  Elevated toilet seat or a handicapped toilet? No   TIMED UP AND GO:  Was the test performed? No . Telephonic Visit  Cognitive Function:        01/05/2023    3:24 PM 03/21/2022   10:05 AM  6CIT Screen  What Year? 0 points 0 points  What month? 0 points 0 points  What time? 0  points 0 points  Count back from 20 0 points 0 points  Months in reverse 0 points 0 points  Repeat phrase 0 points 0 points  Total Score 0 points 0 points    Immunizations Immunization History  Administered Date(s) Administered   COVID-19, mRNA, vaccine(Comirnaty)12 years and older 05/16/2022   Fluad Quad(high Dose 65+) 05/07/2019, 06/14/2020   H1N1 09/22/2008   Influenza Split 05/18/2012   Influenza Whole 08/25/2005, 06/08/2008, 07/04/2009, 06/24/2010   Influenza, High Dose Seasonal PF 06/21/2015, 05/08/2016, 06/27/2017, 06/14/2018, 05/16/2022   Influenza,inj,Quad PF,6+ Mos 07/20/2013, 06/23/2014   PFIZER(Purple Top)SARS-COV-2 Vaccination 09/05/2019, 09/25/2019, 07/03/2020   Pfizer Covid-19 Vaccine Bivalent Booster 57yrs & up 05/09/2021   Pneumococcal Conjugate-13 01/18/2014   Pneumococcal Polysaccharide-23 08/25/2002, 09/21/2007, 02/06/2016, 12/26/2021   Respiratory Syncytial Virus Vaccine,Recomb Aduvanted(Arexvy) 10/09/2022   Td 08/25/2006   Tdap 09/22/2016   Zoster Recombinat (Shingrix) 09/19/2021,  03/16/2022   Zoster, Live 10/20/2008    TDAP status: Up to date  Flu Vaccine status: Up to date  Pneumococcal vaccine status: Up to date  Covid-19 vaccine status: Completed vaccines  Qualifies for Shingles Vaccine? Yes   Zostavax completed Yes   Shingrix Completed?: Yes  Screening Tests Health Maintenance  Topic Date Due   COVID-19 Vaccine (6 - 2023-24 season) 07/11/2022   INFLUENZA VACCINE  03/26/2023   Medicare Annual Wellness (AWV)  01/05/2024   DTaP/Tdap/Td (3 - Td or Tdap) 09/22/2026   Pneumonia Vaccine 29+ Years old  Completed   Zoster Vaccines- Shingrix  Completed   HPV VACCINES  Aged Out    Health Maintenance  Health Maintenance Due  Topic Date Due   COVID-19 Vaccine (6 - 2023-24 season) 07/11/2022    Colorectal cancer screening: Type of screening: Colonoscopy. Completed 08/14/2022. Repeat every 0 years  Lung Cancer Screening: (Low Dose CT Chest recommended if Age 27-80 years, 30 pack-year currently smoking OR have quit w/in 15years.) does not qualify.   Lung Cancer Screening Referral: no  Additional Screening:  Hepatitis C Screening: does not qualify; Completed: no  Vision Screening: Recommended annual ophthalmology exams for early detection of glaucoma and other disorders of the eye. Is the patient up to date with their annual eye exam?  Yes  Who is the provider or what is the name of the office in which the patient attends annual eye exams? Dr. Cephus Richer If pt is not established with a provider, would they like to be referred to a provider to establish care? No .   Dental Screening: Recommended annual dental exams for proper oral hygiene  Community Resource Referral / Chronic Care Management: CRR required this visit?  No   CCM required this visit?  No      Plan:     I have personally reviewed and noted the following in the patient's chart:   Medical and social history Use of alcohol, tobacco or illicit drugs  Current medications and  supplements including opioid prescriptions. Patient is not currently taking opioid prescriptions. Functional ability and status Nutritional status Physical activity Advanced directives List of other physicians Hospitalizations, surgeries, and ER visits in previous 12 months Vitals Screenings to include cognitive, depression, and falls Referrals and appointments  In addition, I have reviewed and discussed with patient certain preventive protocols, quality metrics, and best practice recommendations. A written personalized care plan for preventive services as well as general preventive health recommendations were provided to patient.     Mickeal Needy, LPN   1/61/0960   Nurse Notes:  Normal cognitive  status assessed by direct observation via telephone conversation by this Nurse Health Advisor. No abnormalities found.   Vital signs completed during office visit with Dr. Sanda Linger on today.

## 2023-01-06 DIAGNOSIS — R197 Diarrhea, unspecified: Secondary | ICD-10-CM | POA: Diagnosis not present

## 2023-01-07 LAB — FECAL LACTOFERRIN, QUANT: LACTOFERRIN, QL, STOOL: POSITIVE — AB

## 2023-01-08 ENCOUNTER — Encounter: Payer: Self-pay | Admitting: Internal Medicine

## 2023-01-08 ENCOUNTER — Other Ambulatory Visit: Payer: Self-pay | Admitting: Internal Medicine

## 2023-01-08 LAB — GI PROFILE, STOOL, PCR

## 2023-01-14 DIAGNOSIS — H2512 Age-related nuclear cataract, left eye: Secondary | ICD-10-CM | POA: Diagnosis not present

## 2023-01-15 DIAGNOSIS — H2511 Age-related nuclear cataract, right eye: Secondary | ICD-10-CM | POA: Diagnosis not present

## 2023-01-22 ENCOUNTER — Other Ambulatory Visit (INDEPENDENT_AMBULATORY_CARE_PROVIDER_SITE_OTHER): Payer: PPO

## 2023-01-22 ENCOUNTER — Other Ambulatory Visit: Payer: Self-pay | Admitting: Internal Medicine

## 2023-01-22 DIAGNOSIS — D509 Iron deficiency anemia, unspecified: Secondary | ICD-10-CM | POA: Diagnosis not present

## 2023-01-22 LAB — IBC + FERRITIN
Ferritin: 15.3 ng/mL — ABNORMAL LOW (ref 22.0–322.0)
Iron: 168 ug/dL — ABNORMAL HIGH (ref 42–165)
Saturation Ratios: 48.4 % (ref 20.0–50.0)
TIBC: 347.2 ug/dL (ref 250.0–450.0)
Transferrin: 248 mg/dL (ref 212.0–360.0)

## 2023-01-22 LAB — CBC WITH DIFFERENTIAL/PLATELET
Basophils Absolute: 0.1 10*3/uL (ref 0.0–0.1)
Basophils Relative: 0.8 % (ref 0.0–3.0)
Eosinophils Absolute: 0.1 10*3/uL (ref 0.0–0.7)
Eosinophils Relative: 1.9 % (ref 0.0–5.0)
HCT: 37.5 % — ABNORMAL LOW (ref 39.0–52.0)
Hemoglobin: 12.4 g/dL — ABNORMAL LOW (ref 13.0–17.0)
Lymphocytes Relative: 34.1 % (ref 12.0–46.0)
Lymphs Abs: 2.5 10*3/uL (ref 0.7–4.0)
MCHC: 33.1 g/dL (ref 30.0–36.0)
MCV: 96.8 fl (ref 78.0–100.0)
Monocytes Absolute: 0.8 10*3/uL (ref 0.1–1.0)
Monocytes Relative: 10.8 % (ref 3.0–12.0)
Neutro Abs: 3.8 10*3/uL (ref 1.4–7.7)
Neutrophils Relative %: 52.4 % (ref 43.0–77.0)
Platelets: 304 10*3/uL (ref 150.0–400.0)
RBC: 3.87 Mil/uL — ABNORMAL LOW (ref 4.22–5.81)
RDW: 14 % (ref 11.5–15.5)
WBC: 7.3 10*3/uL (ref 4.0–10.5)

## 2023-02-04 DIAGNOSIS — H2511 Age-related nuclear cataract, right eye: Secondary | ICD-10-CM | POA: Diagnosis not present

## 2023-03-04 ENCOUNTER — Other Ambulatory Visit: Payer: Self-pay | Admitting: Internal Medicine

## 2023-03-04 DIAGNOSIS — G252 Other specified forms of tremor: Secondary | ICD-10-CM

## 2023-03-04 DIAGNOSIS — K2270 Barrett's esophagus without dysplasia: Secondary | ICD-10-CM

## 2023-03-04 DIAGNOSIS — K219 Gastro-esophageal reflux disease without esophagitis: Secondary | ICD-10-CM

## 2023-03-11 ENCOUNTER — Other Ambulatory Visit: Payer: Self-pay | Admitting: Internal Medicine

## 2023-03-11 DIAGNOSIS — I2510 Atherosclerotic heart disease of native coronary artery without angina pectoris: Secondary | ICD-10-CM

## 2023-03-12 ENCOUNTER — Other Ambulatory Visit: Payer: Self-pay | Admitting: Internal Medicine

## 2023-03-12 DIAGNOSIS — L570 Actinic keratosis: Secondary | ICD-10-CM | POA: Diagnosis not present

## 2023-03-12 DIAGNOSIS — D508 Other iron deficiency anemias: Secondary | ICD-10-CM

## 2023-03-17 ENCOUNTER — Telehealth: Payer: Self-pay | Admitting: Internal Medicine

## 2023-03-17 NOTE — Telephone Encounter (Signed)
Pt would like a TOC from dr Yetta Barre to dr Doreene Burke. Pt stated because he have moved and this office is closer to him now

## 2023-03-25 ENCOUNTER — Encounter (INDEPENDENT_AMBULATORY_CARE_PROVIDER_SITE_OTHER): Payer: Self-pay

## 2023-04-14 ENCOUNTER — Encounter: Payer: Self-pay | Admitting: Family Medicine

## 2023-04-14 ENCOUNTER — Ambulatory Visit: Payer: PPO | Admitting: Family Medicine

## 2023-04-14 VITALS — BP 126/84 | HR 46 | Temp 97.6°F | Ht 66.0 in | Wt 164.2 lb

## 2023-04-14 DIAGNOSIS — Z131 Encounter for screening for diabetes mellitus: Secondary | ICD-10-CM

## 2023-04-14 DIAGNOSIS — R3129 Other microscopic hematuria: Secondary | ICD-10-CM | POA: Insufficient documentation

## 2023-04-14 DIAGNOSIS — G250 Essential tremor: Secondary | ICD-10-CM | POA: Diagnosis not present

## 2023-04-14 DIAGNOSIS — D539 Nutritional anemia, unspecified: Secondary | ICD-10-CM

## 2023-04-14 DIAGNOSIS — E7800 Pure hypercholesterolemia, unspecified: Secondary | ICD-10-CM

## 2023-04-14 DIAGNOSIS — I2510 Atherosclerotic heart disease of native coronary artery without angina pectoris: Secondary | ICD-10-CM

## 2023-04-14 DIAGNOSIS — I10 Essential (primary) hypertension: Secondary | ICD-10-CM | POA: Diagnosis not present

## 2023-04-14 LAB — LIPID PANEL
Cholesterol: 112 mg/dL (ref 0–200)
HDL: 44.4 mg/dL (ref >39.00)
LDL Cholesterol: 50 mg/dL (ref 0–99)
NonHDL: 67.48
Total CHOL/HDL Ratio: 3
Triglycerides: 85 mg/dL (ref 0.0–149.0)
VLDL: 17 mg/dL (ref 0.0–40.0)

## 2023-04-14 LAB — URINALYSIS, ROUTINE W REFLEX MICROSCOPIC
Bilirubin Urine: NEGATIVE
Hgb urine dipstick: NEGATIVE
Ketones, ur: NEGATIVE
Leukocytes,Ua: NEGATIVE
Nitrite: NEGATIVE
RBC / HPF: NONE SEEN (ref 0–?)
Specific Gravity, Urine: <=1.005 — AB (ref 1.000–1.030)
Total Protein, Urine: NEGATIVE
Urine Glucose: NEGATIVE
Urobilinogen, UA: 0.2 (ref 0.0–1.0)
WBC, UA: NONE SEEN (ref 0–?)
pH: 6 (ref 5.0–8.0)

## 2023-04-14 LAB — COMPREHENSIVE METABOLIC PANEL
ALT: 27 U/L (ref 0–53)
AST: 26 U/L (ref 0–37)
Albumin: 4.4 g/dL (ref 3.5–5.2)
Alkaline Phosphatase: 82 U/L (ref 39–117)
BUN: 19 mg/dL (ref 6–23)
CO2: 30 mEq/L (ref 19–32)
Calcium: 9.9 mg/dL (ref 8.4–10.5)
Chloride: 102 mEq/L (ref 96–112)
Creatinine, Ser: 1.12 mg/dL (ref 0.40–1.50)
GFR: 60.29 mL/min (ref >60.00)
Glucose, Bld: 101 mg/dL — ABNORMAL HIGH (ref 70–99)
Potassium: 4.8 mEq/L (ref 3.5–5.1)
Sodium: 139 mEq/L (ref 135–145)
Total Bilirubin: 0.4 mg/dL (ref 0.2–1.2)
Total Protein: 7 g/dL (ref 6.0–8.3)

## 2023-04-14 LAB — CBC
HCT: 43.1 % (ref 39.0–52.0)
Hemoglobin: 14.2 g/dL (ref 13.0–17.0)
MCHC: 32.9 g/dL (ref 30.0–36.0)
MCV: 96.4 fl (ref 78.0–100.0)
Platelets: 239 10*3/uL (ref 150.0–400.0)
RBC: 4.47 Mil/uL (ref 4.22–5.81)
RDW: 13.9 % (ref 11.5–15.5)
WBC: 7.4 10*3/uL (ref 4.0–10.5)

## 2023-04-14 LAB — HEMOGLOBIN A1C: Hgb A1c MFr Bld: 5.8 % (ref 4.6–6.5)

## 2023-04-14 MED ORDER — NITROGLYCERIN 0.4 MG SL SUBL
SUBLINGUAL_TABLET | SUBLINGUAL | 2 refills | Status: AC
Start: 2023-04-14 — End: ?

## 2023-04-14 MED ORDER — IRON (FERROUS SULFATE) 325 (65 FE) MG PO TABS
325.0000 mg | ORAL_TABLET | ORAL | 1 refills | Status: AC
Start: 2023-04-14 — End: ?

## 2023-04-14 MED ORDER — IRON (FERROUS SULFATE) 325 (65 FE) MG PO TABS
325.0000 mg | ORAL_TABLET | Freq: Every day | ORAL | 1 refills | Status: DC
Start: 2023-04-14 — End: 2023-04-14

## 2023-04-14 MED ORDER — IRON (FERROUS SULFATE) 325 (65 FE) MG PO TABS
325.0000 mg | ORAL_TABLET | ORAL | 1 refills | Status: DC
Start: 2023-04-14 — End: 2023-04-14

## 2023-04-14 NOTE — Progress Notes (Signed)
New Patient Office Visit  Subjective    Patient ID: [name], male    DOB: 1940-11-25  Age: 82 y.o. MRN: [id number]  CC:  Chief Complaint  Patient presents with   Transitions Of Care    Pt wants to switch to Dr, Doreene Burke because he lives closer here now. Pt has a cardiologist. Pt requesting Rx for Ferrous Sulfate. In pt chart there is a note of a allergy but pt states he is not allergic to Ferrous Sulfate.     HPI [name] presents to establish care. Encounter Diagnoses  Name Primary?   Essential hypertension Yes   Elevated cholesterol    Screening for diabetes mellitus    Macrocytic anemia    Benign essential tremor    Coronary artery disease involving native coronary artery of native heart without angina pectoris    Microhematuria    Establishment of care by way of transfer.  History of hypertension treated with medications as listed below.  Pulse rate normally runs a little low.  He has had no further chest pain since stent placement back in January with drug-eluting stents.  Recently atorvastatin was increased to 80 mg daily to lower his LDL below 60.  He has been on primidone for benign essential tremor for years with good effect.  Sees urology and is followed for microscopic hematuria.  History of macrocytic anemia.  B12 levels have been normal.  Iron levels have been normal with low ferritin levels. Outpatient Encounter Medications as of 04/14/2023  Medication Sig   amLODipine (NORVASC) 10 MG tablet TAKE ONE TABLET BY MOUTH EVERYDAY AT BEDTIME   aspirin EC 81 MG tablet Take 81 mg by mouth daily. Swallow whole.   atenolol (TENORMIN) 50 MG tablet Take 50 mg by mouth 2 (two) times daily.   atorvastatin (LIPITOR) 80 MG tablet Take 1 tablet (80 mg total) by mouth at bedtime.   clopidogrel (PLAVIX) 75 MG tablet Take 1 tablet (75 mg total) by mouth daily with breakfast.   Coenzyme Q10 (CO Q 10 PO) Take 1 tablet by mouth daily.   hydrALAZINE (APRESOLINE) 25 MG tablet  Take 1 tablet (25 mg total) by mouth 3 (three) times daily.   isosorbide mononitrate (IMDUR) 60 MG 24 hr tablet TAKE 1 TABLET BY MOUTH EVERY DAY   lisinopril (ZESTRIL) 10 MG tablet TAKE 1 TABLET BY MOUTH AT BEDTIME   Multiple Vitamins-Minerals (PRESERVISION AREDS 2) CAPS Take 1 capsule by mouth 2 (two) times daily.   pantoprazole (PROTONIX) 40 MG tablet TAKE ONE TABLET BY MOUTH EVERY MORNING   Polyethyl Glycol-Propyl Glycol (SYSTANE) 0.4-0.3 % SOLN Place 2 drops into both eyes every morning.   primidone (MYSOLINE) 50 MG tablet TAKE ONE TABLET BY MOUTH AT BREAKFAST AND AT BEDTIME   [DISCONTINUED] Iron, Ferrous Sulfate, 325 (65 Fe) MG TABS Take 325 mg by mouth daily.   [DISCONTINUED] nitroGLYCERIN (NITROSTAT) 0.4 MG SL tablet DISSOLVE 1 TABLET UNDER THE TONGUE EVERY 5 MINUTES AS NEEDED FOR CHEST PAIN. DO NOT EXCEED A TOTAL OF 3 DOSES IN 15 MINUTES.   Iron, Ferrous Sulfate, 325 (65 Fe) MG TABS Take 325 mg by mouth every other day.   nitroGLYCERIN (NITROSTAT) 0.4 MG SL tablet DISSOLVE 1 TABLET UNDER THE TONGUE EVERY 5 MINUTES AS NEEDED FOR CHEST PAIN. DO NOT EXCEED A TOTAL OF 3 DOSES IN 15 MINUTES.   [DISCONTINUED] Iron, Ferrous Sulfate, 325 (65 Fe) MG TABS Take 325 mg by mouth every other day.   [DISCONTINUED]  lidocaine (HM LIDOCAINE PATCH) 4 % Place 1 patch onto the skin as needed. (Patient not taking: Reported on 04/14/2023)   No facility-administered encounter medications on file as of 04/14/2023.    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 - decreased dose of HCTZ to M,F   Diverticulosis    DJD (degenerative joint disease)    Duodenal ulcer    ED (erectile dysfunction)    GERD  (gastroesophageal reflux disease)    increased s/s in the last week 03-22-19 PV    Hemorrhoids    Hyperlipidemia    Hypertension    Myocardial infarction (HCC)    2002   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   mid frontal scalp-[id number]   SCC (squamous cell carcinoma) 06/15/2019   front middle scalp-tx cx68fu   SCC (squamous cell carcinoma) 12/27/2002   CIS-right angle of jaw--[id number]   SCC (squamous cell carcinoma) 09/24/2004   CIS--mid post crown, left mis crown medial,left first web space-tx cx33fu   SCC (squamous cell carcinoma) 06/03/2006   SCCA-right temple-tx cx72fu   SCC (squamous cell carcinoma) 06/03/2006   CIS-right forehead, left mis crown scalp medial-tx cx93fu   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/30/2006   right temple lateral-txpbx   SCC (squamous cell carcinoma) 11/10/2006   left forehead, left hand proximal   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/02/2009   left scalp-[id number]   SCC (squamous cell carcinoma) 01/24/2015   left scalp-cx57fu   SCC (squamous cell carcinoma) 11/12/2015   front lateralscalp-cx64fu, medial scalp-cx32fu   SCC (squamous cell carcinoma) 12/17/2016   front center scalp-cx10fu   Squamous cell carcinoma of skin 08/10/2017   CIS--right ear rim, top of scalp, left side scalp-tx-cx43fu    Past Surgical History:  Procedure Laterality Date   CARDIAC CATHETERIZATION     COLONOSCOPY     CORONARY STENT INTERVENTION N/A 09/03/2022   Procedure: CORONARY STENT INTERVENTION;  Surgeon: Marykay Lex, MD;  Location: [facility] INVASIVE CV [facility];  Service: Cardiovascular;  Laterality: N/A;   ELBOW ARTHROSCOPY     heart stents     2002,2004   LEFT HEART CATH AND CORONARY ANGIOGRAPHY N/A 09/03/2022   Procedure: LEFT HEART CATH AND CORONARY ANGIOGRAPHY;  Surgeon: Marykay Lex, MD;  Location: [facility] INVASIVE CV LAB;  Service: Cardiovascular;  Laterality: N/A;   SPINAL FUSION     SPINAL FUSION     SPINE SURGERY     cervical   UPPER GASTROINTESTINAL ENDOSCOPY       Family History  Problem Relation Age of Onset   Alcohol abuse Other    Coronary artery disease Other    Alzheimer's disease Other    Dementia Mother    Colon cancer Neg Hx    Esophageal cancer Neg Hx    Rectal cancer Neg Hx    Stomach cancer Neg Hx    Colon polyps Neg Hx     Social History   Socioeconomic History   Marital status: Married    Spouse name: Not on file   Number of children: 1   Years of education: 17   Highest education level: Education administrator  degree (e.g., MA, MS, MEng, MEd, MSW, MBA)  Occupational History   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   Occupation: retired  Tobacco Use   Smoking status: Former    Current packs/day: 0.00    Types: Cigarettes    Quit date: 08/25/1961    Years since quitting: 61.6    Passive exposure: Never   Smokeless tobacco: Never  Vaping Use   Vaping status: Never Used  Substance and Sexual Activity   Alcohol use: Yes    Comment: very seldom   Drug use: No   Sexual activity: Not Currently  Other Topics Concern   Not on file  Social History Narrative   Right handed   1 Story home    live spouse   Social Determinants of Health   Financial Resource Strain: Low Risk  (01/05/2023)   Overall Financial Resource Strain (CARDIA)    Difficulty of Paying Living Expenses: Not hard at all  Food Insecurity: No Food Insecurity (01/05/2023)   Hunger Vital Sign    Worried About Running Out of Food in the Last Year: Never true    Ran Out of Food in the Last Year: Never true  Transportation Needs: No Transportation Needs (01/05/2023)   PRAPARE - Administrator, Civil Service (Medical): No    Lack of Transportation (Non-Medical): No  Physical Activity: Insufficiently Active (01/05/2023)   Exercise Vital Sign    Days of Exercise per Week: 4 days    Minutes of Exercise per Session: 30 min  Stress: No Stress Concern Present (01/05/2023)   [employer] of Occupational Health - Occupational Stress Questionnaire     Feeling of Stress : Not at all  Social Connections: Unknown (01/05/2023)   Social Connection and Isolation Panel [NHANES]    Frequency of Communication with Friends and Family: Once a week    Frequency of Social Gatherings with Friends and Family: Once a week    Attends Religious Services: Patient declined    Database administrator or Organizations: Yes    Attends Engineer, structural: More than 4 times per year    Marital Status: Married  Catering manager Violence: Not At Risk (01/05/2023)   Humiliation, Afraid, Rape, and Kick questionnaire    Fear of Current or Ex-Partner: No    Emotionally Abused: No    Physically Abused: No    Sexually Abused: No    Review of Systems  Constitutional: Negative.   HENT: Negative.    Eyes:  Negative for blurred vision, discharge and redness.  Respiratory: Negative.  Negative for shortness of breath.   Cardiovascular: Negative.  Negative for chest pain and palpitations.  Gastrointestinal:  Negative for abdominal pain.  Genitourinary: Negative.   Musculoskeletal: Negative.  Negative for myalgias.  Skin:  Negative for rash.  Neurological:  Negative for tingling, loss of consciousness and weakness.  Endo/Heme/Allergies:  Negative for polydipsia.        Objective    BP 126/84   Pulse (!) 46   Temp 97.6 F (36.4 C)   Ht 5\' 6"  (1.676 m)   Wt 164 lb 3.2 oz (74.5 kg)   SpO2 96%   BMI 26.50 kg/m   Physical Exam Constitutional:      General: He is not in acute distress.    Appearance: Normal appearance. He is not ill-appearing, toxic-appearing or diaphoretic.  HENT:     Head: Normocephalic and atraumatic.     Right Ear: External ear normal.  Left Ear: External ear normal.     Mouth/Throat:     Mouth: Mucous membranes are moist.     Pharynx: Oropharynx is clear. No oropharyngeal exudate or posterior oropharyngeal erythema.  Eyes:     General: No scleral icterus.       Right eye: No discharge.        Left eye: No discharge.      Extraocular Movements: Extraocular movements intact.     Conjunctiva/sclera: Conjunctivae normal.     Pupils: Pupils are equal, round, and reactive to light.  Cardiovascular:     Rate and Rhythm: Normal rate and regular rhythm.  Pulmonary:     Effort: Pulmonary effort is normal. No respiratory distress.     Breath sounds: Normal breath sounds. No wheezing, rhonchi or rales.  Abdominal:     General: Bowel sounds are normal.     Tenderness: There is no abdominal tenderness. There is no guarding.  Musculoskeletal:     Cervical back: No rigidity or tenderness.  Skin:    General: Skin is warm and dry.  Neurological:     Mental Status: He is alert and oriented to person, place, and time.  Psychiatric:        Mood and Affect: Mood normal.        Behavior: Behavior normal.         Assessment & Plan:   Essential hypertension -     CBC -     Comprehensive metabolic panel -     Urinalysis, Routine w reflex microscopic  Elevated cholesterol -     Comprehensive metabolic panel -     Lipid panel  Screening for diabetes mellitus -     Comprehensive metabolic panel -     Urinalysis, Routine w reflex microscopic -     Hemoglobin A1c  Macrocytic anemia -     CBC -     Urinalysis, Routine w reflex microscopic -     Iron (Ferrous Sulfate); Take 325 mg by mouth every other day.  Dispense: 90 tablet; Refill: 1  Benign essential tremor  Coronary artery disease involving native coronary artery of native heart without angina pectoris -     Nitroglycerin; DISSOLVE 1 TABLET UNDER THE TONGUE EVERY 5 MINUTES AS NEEDED FOR CHEST PAIN. DO NOT EXCEED A TOTAL OF 3 DOSES IN 15 MINUTES.  Dispense: 25 tablet; Refill: 2  Microhematuria -     CBC -     Urinalysis, Routine w reflex microscopic     Return in about 3 months (around 07/15/2023).   Mliss Sax, MD

## 2023-04-24 ENCOUNTER — Telehealth: Payer: Self-pay | Admitting: Cardiology

## 2023-04-24 DIAGNOSIS — I2510 Atherosclerotic heart disease of native coronary artery without angina pectoris: Secondary | ICD-10-CM

## 2023-04-24 MED ORDER — ISOSORBIDE MONONITRATE ER 60 MG PO TB24: 60.0000 mg | ORAL_TABLET | Freq: Every day | ORAL | 3 refills | Status: DC

## 2023-04-24 MED ORDER — LISINOPRIL 10 MG PO TABS: 10.0000 mg | ORAL_TABLET | Freq: Every day | ORAL | 3 refills | Status: DC

## 2023-04-24 MED ORDER — ATENOLOL 50 MG PO TABS: 50.0000 mg | ORAL_TABLET | Freq: Two times a day (BID) | ORAL | 3 refills | Status: DC

## 2023-04-24 MED ORDER — HYDRALAZINE HCL 25 MG PO TABS: 25.0000 mg | ORAL_TABLET | Freq: Three times a day (TID) | ORAL | 0 refills | Status: DC

## 2023-04-24 MED ORDER — CLOPIDOGREL BISULFATE 75 MG PO TABS: 75.0000 mg | ORAL_TABLET | Freq: Every day | ORAL | 2 refills | Status: DC

## 2023-04-24 MED ORDER — ATORVASTATIN CALCIUM 80 MG PO TABS: 80.0000 mg | ORAL_TABLET | Freq: Every day | ORAL | 3 refills | Status: DC

## 2023-04-24 NOTE — Telephone Encounter (Signed)
Returned pt call to get more information. Pt states his pharmacy is going out of business and he would like to have them all [facility] [address], [place]. Let pt know his refills will be sent to the new pharmacy. He had a physical in August with Dr. Farris Has and he had blood work done and he wants Dr. Antoine Poche to look at.

## 2023-04-24 NOTE — Telephone Encounter (Signed)
Patient is calling because he would like to speak with the nurse about his pharmacy and his recent blood work. Please advise.

## 2023-04-24 NOTE — Telephone Encounter (Signed)
Returned call to pt and LVM that Dr. Flora Lipps states  he is happy with his cholesterol. Advised that this message will be sent via MyChart as well.

## 2023-06-05 ENCOUNTER — Encounter: Payer: Self-pay | Admitting: *Deleted

## 2023-06-19 ENCOUNTER — Telehealth: Payer: Self-pay | Admitting: Cardiology

## 2023-06-19 DIAGNOSIS — I10 Essential (primary) hypertension: Secondary | ICD-10-CM

## 2023-06-19 MED ORDER — AMLODIPINE BESYLATE 10 MG PO TABS: ORAL_TABLET | ORAL | 1 refills | Status: DC

## 2023-06-19 NOTE — Telephone Encounter (Signed)
*  STAT* If patient is at the pharmacy, call can be transferred to refill team.   1. Which medications need to be refilled? (please list name of each medication and dose if known) amLODipine (NORVASC) 10 MG tablet  2. Which pharmacy/location (including street and city if local pharmacy) is medication to be sent to? [id number] - [place], [place] - [address]  3. Do they need a 30 day or 90 day supply? 90   

## 2023-06-19 NOTE — Telephone Encounter (Signed)
Refills has been sent to the pharmacy. 

## 2023-06-24 ENCOUNTER — Encounter: Payer: Self-pay | Admitting: Physician Assistant

## 2023-06-24 ENCOUNTER — Ambulatory Visit: Payer: PPO | Admitting: Physician Assistant

## 2023-06-24 VITALS — BP 120/70 | HR 53 | Ht 65.0 in | Wt 168.6 lb

## 2023-06-24 DIAGNOSIS — K2270 Barrett's esophagus without dysplasia: Secondary | ICD-10-CM | POA: Diagnosis not present

## 2023-06-24 NOTE — Progress Notes (Signed)
Patient apparently received 2 letters from our office telling him he needed to make an office visit today to discuss repeat EGD (I cannot see these in our records).  At time of his last EGD and colonoscopy in December of last year no repeat was recommended.  He did briefly ask if he needs to stay on his Pantoprazole, told him to take his 40 mg daily.  We will no charge him for today's visit and he will get his co-pay back.  Hyacinth Meeker, PA-C

## 2023-07-09 DIAGNOSIS — H02834 Dermatochalasis of left upper eyelid: Secondary | ICD-10-CM | POA: Diagnosis not present

## 2023-07-09 DIAGNOSIS — Z961 Presence of intraocular lens: Secondary | ICD-10-CM | POA: Diagnosis not present

## 2023-07-09 DIAGNOSIS — H1132 Conjunctival hemorrhage, left eye: Secondary | ICD-10-CM | POA: Diagnosis not present

## 2023-07-13 DIAGNOSIS — L570 Actinic keratosis: Secondary | ICD-10-CM | POA: Diagnosis not present

## 2023-07-16 ENCOUNTER — Ambulatory Visit: Payer: PPO | Admitting: Family Medicine

## 2023-07-16 ENCOUNTER — Encounter: Payer: Self-pay | Admitting: Family Medicine

## 2023-07-16 VITALS — BP 132/76 | HR 55 | Temp 97.7°F | Ht 65.0 in | Wt 169.8 lb

## 2023-07-16 DIAGNOSIS — M7061 Trochanteric bursitis, right hip: Secondary | ICD-10-CM

## 2023-07-16 DIAGNOSIS — R7309 Other abnormal glucose: Secondary | ICD-10-CM | POA: Insufficient documentation

## 2023-07-16 LAB — BASIC METABOLIC PANEL
BUN: 19 mg/dL (ref 6–23)
CO2: 31 meq/L (ref 19–32)
Calcium: 9.7 mg/dL (ref 8.4–10.5)
Chloride: 103 meq/L (ref 96–112)
Creatinine, Ser: 1.29 mg/dL (ref 0.40–1.50)
GFR: 50.8 mL/min — ABNORMAL LOW (ref >60.00)
Glucose, Bld: 100 mg/dL — ABNORMAL HIGH (ref 70–99)
Potassium: 5.1 meq/L (ref 3.5–5.1)
Sodium: 139 meq/L (ref 135–145)

## 2023-07-16 LAB — HEMOGLOBIN A1C: Hgb A1c MFr Bld: 5.9 % (ref 4.6–6.5)

## 2023-07-16 NOTE — Progress Notes (Signed)
Established Patient Office Visit   Subjective:  Patient ID: [name], male    DOB: 1941/07/11  Age: 82 y.o. MRN: [id number]  Chief Complaint  Patient presents with   Medical Management of Chronic Issues    3 month follow up. Pt is fasting.    Hip Pain    Right hip pain x 6+ months. Pt states it has bother him more lately. Ignored the pain for a while.     Hip Pain  Pertinent negatives include no tingling.   Encounter Diagnoses  Name Primary?   Elevated glucose Yes   Trochanteric bursitis of right hip    For follow-up of elevated A1c into the prediabetic range and is complaining of right hip pain.  Hip has not been waking him up at night.  He has been using the patch with good effect.  Denies chest pain or shortness of breath.  Has not used the nitroglycerin.  He is walking 01-7999 steps daily   Review of Systems  Constitutional: Negative.   HENT: Negative.    Eyes:  Negative for blurred vision, discharge and redness.  Respiratory: Negative.    Cardiovascular: Negative.   Gastrointestinal:  Negative for abdominal pain.  Genitourinary: Negative.   Musculoskeletal:  Positive for joint pain. Negative for myalgias.  Skin:  Negative for rash.  Neurological:  Negative for tingling, loss of consciousness and weakness.  Endo/Heme/Allergies:  Negative for polydipsia.     Current Outpatient Medications:    amLODipine (NORVASC) 10 MG tablet, TAKE ONE TABLET BY MOUTH EVERYDAY AT BEDTIME, Disp: 90 tablet, Rfl: 1   aspirin EC 81 MG tablet, Take 81 mg by mouth daily. Swallow whole., Disp: , Rfl:    atenolol (TENORMIN) 50 MG tablet, Take 1 tablet (50 mg total) by mouth 2 (two) times daily., Disp: 90 tablet, Rfl: 3   atorvastatin (LIPITOR) 80 MG tablet, Take 1 tablet (80 mg total) by mouth at bedtime., Disp: 90 tablet, Rfl: 3   clopidogrel (PLAVIX) 75 MG tablet, Take 1 tablet (75 mg total) by mouth daily with breakfast., Disp: 90 tablet, Rfl: 2   Coenzyme Q10 (CO Q 10 PO), Take 1  tablet by mouth daily., Disp: , Rfl:    hydrALAZINE (APRESOLINE) 25 MG tablet, Take 1 tablet (25 mg total) by mouth 3 (three) times daily., Disp: 180 tablet, Rfl: 0   Iron, Ferrous Sulfate, 325 (65 Fe) MG TABS, Take 325 mg by mouth every other day., Disp: 90 tablet, Rfl: 1   isosorbide mononitrate (IMDUR) 60 MG 24 hr tablet, Take 1 tablet (60 mg total) by mouth daily., Disp: 90 tablet, Rfl: 3   lisinopril (ZESTRIL) 10 MG tablet, Take 1 tablet (10 mg total) by mouth at bedtime., Disp: 90 tablet, Rfl: 3   Multiple Vitamins-Minerals (PRESERVISION AREDS 2) CAPS, Take 1 capsule by mouth 2 (two) times daily., Disp: , Rfl:    nitroGLYCERIN (NITROSTAT) 0.4 MG SL tablet, DISSOLVE 1 TABLET UNDER THE TONGUE EVERY 5 MINUTES AS NEEDED FOR CHEST PAIN. DO NOT EXCEED A TOTAL OF 3 DOSES IN 15 MINUTES., Disp: 25 tablet, Rfl: 2   pantoprazole (PROTONIX) 40 MG tablet, TAKE ONE TABLET BY MOUTH EVERY MORNING, Disp: 90 tablet, Rfl: 0   Polyethyl Glycol-Propyl Glycol (SYSTANE) 0.4-0.3 % SOLN, Place 2 drops into both eyes every morning., Disp: , Rfl:    primidone (MYSOLINE) 50 MG tablet, TAKE ONE TABLET BY MOUTH AT BREAKFAST AND AT BEDTIME, Disp: 180 tablet, Rfl: 0   Objective:  BP 132/76   Pulse (!) 55   Temp 97.7 F (36.5 C)   Ht 5\' 5"  (1.651 m)   Wt 169 lb 12.8 oz (77 kg)   SpO2 96%   BMI 28.26 kg/m  Wt Readings from Last 3 Encounters:  07/16/23 169 lb 12.8 oz (77 kg)  06/24/23 168 lb 9.6 oz (76.5 kg)  04/14/23 164 lb 3.2 oz (74.5 kg)      Physical Exam Constitutional:      General: He is not in acute distress.    Appearance: Normal appearance. He is not ill-appearing, toxic-appearing or diaphoretic.  HENT:     Head: Normocephalic and atraumatic.     Right Ear: External ear normal.     Left Ear: External ear normal.  Eyes:     General: No scleral icterus.       Right eye: No discharge.        Left eye: No discharge.     Extraocular Movements: Extraocular movements intact.      Conjunctiva/sclera: Conjunctivae normal.  Cardiovascular:     Rate and Rhythm: Normal rate and regular rhythm. Occasional Extrasystoles are present. Pulmonary:     Effort: Pulmonary effort is normal. No respiratory distress.     Breath sounds: No wheezing, rhonchi or rales.  Musculoskeletal:     Right hip: Tenderness present. Normal range of motion.     Left hip: No tenderness. Normal range of motion.       Legs:  Skin:    General: Skin is warm and dry.  Neurological:     Mental Status: He is alert and oriented to person, place, and time.  Psychiatric:        Mood and Affect: Mood normal.        Behavior: Behavior normal.      No results found for any visits on 07/16/23.    The ASCVD Risk score (Arnett DK, et al., 2019) failed to calculate for the following reasons:   The 2019 ASCVD risk score is only valid for ages 30 to 71    Assessment & Plan:   Elevated glucose -     Basic metabolic panel -     Hemoglobin A1c  Trochanteric bursitis of right hip -     Ambulatory referral to Sports Medicine    Return in about 6 months (around 01/13/2024), or if symptoms worsen or fail to improve.  Continue lidocaine patch.  Okay to use Voltaren gel no more than twice daily.  Sports medicine referral.  Information on prediabetes was given.  Mliss Sax, MD

## 2023-07-20 ENCOUNTER — Encounter: Payer: Self-pay | Admitting: Family Medicine

## 2023-07-20 ENCOUNTER — Ambulatory Visit: Payer: PPO | Admitting: Family Medicine

## 2023-07-20 ENCOUNTER — Ambulatory Visit (HOSPITAL_BASED_OUTPATIENT_CLINIC_OR_DEPARTMENT_OTHER)
Admission: RE | Admit: 2023-07-20 | Discharge: 2023-07-20 | Disposition: A | Discharge status: Home / Self Care | Payer: PPO | Source: Ambulatory Visit | Attending: Family Medicine | Admitting: Family Medicine

## 2023-07-20 VITALS — BP 130/82 | Ht 65.0 in | Wt 169.0 lb

## 2023-07-20 DIAGNOSIS — M25551 Pain in right hip: Secondary | ICD-10-CM

## 2023-07-20 DIAGNOSIS — I878 Other specified disorders of veins: Secondary | ICD-10-CM | POA: Diagnosis not present

## 2023-07-20 DIAGNOSIS — G8929 Other chronic pain: Secondary | ICD-10-CM | POA: Diagnosis not present

## 2023-07-20 NOTE — Progress Notes (Signed)
CHIEF COMPLAINT: No chief complaint on file.  _____________________________________________________________ SUBJECTIVE  HPI  Pt is a 82 y.o. 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 The pain has been making it difficult for him to exercise and stay healthy.  Pain is sharp in quality, mostly localized but will occasionally radiate down thigh a little or around the area in a C-like distribution. Pain is constant, but exacerbations last a few seconds. No associated numbness or tingling Walking and stairs the most painful Denies catching/locking, but does feel like his hip will buckle Has not been keeping him up at night  Seen in PCP clinic 07/16/23, Remedies including lidocaine patch, voltaren gel (advised no more than BID), referral placed  Got voltaren gel, states this has been helping remarkably  No recent imaging Has been evaluated by [facility] for trochanteric bursitis (11/2020; bilateral, L>R at the time, Rx PT at the time, which he did attend (10 sessions over 2 mo), Rx prednisone), lumbosacral strain (01/2021; b/l hip pain, suspected LBP sustained from PT change in program, Rzx tizanidine, PT) MHX includes CKD, CAD, skin cancer, HTN, hx spinal fusion surgery Allergies reviewed ------------------------------------------------------------------------------------------------------ 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  - decreased dose of HCTZ to M,F   Diverticulosis    DJD (degenerative joint disease)    Duodenal ulcer    ED (erectile dysfunction)    GERD (gastroesophageal reflux disease)    increased s/s in the last week 03-22-19 PV    Hemorrhoids    Hx of adenomatous colonic polyps    Hyperlipidemia    Hypertension    Myocardial infarction (HCC)    2002   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   mid frontal scalp-cx40fu   SCC (squamous cell carcinoma) 06/15/2019   front middle scalp-tx cx80fu   SCC (squamous cell carcinoma) 12/27/2002   CIS-right angle of jaw--cx13fu   SCC (squamous cell carcinoma) 09/24/2004   CIS--mid post crown, left mis crown medial,left first web space-tx cx15fu   SCC (squamous cell carcinoma) 06/03/2006   SCCA-right temple-tx cx16fu   SCC (squamous cell carcinoma) 06/03/2006   CIS-right forehead, left mis crown scalp medial-tx cx65fu   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/30/2006   right temple lateral-txpbx   SCC (squamous cell carcinoma) 11/10/2006   left forehead, left hand proximal   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/02/2009   left scalp-cx38fu   SCC (squamous cell carcinoma) 01/24/2015   left scalp-cx52fu   SCC (squamous cell carcinoma) 11/12/2015   front lateralscalp-cx15fu, medial scalp-cx70fu   SCC (squamous cell carcinoma) 12/17/2016   front center scalp-cx4fu   Squamous cell carcinoma of skin 08/10/2017   CIS--right ear rim, top of scalp, left side scalp-tx-cx59fu    Past Surgical History:  Procedure Laterality Date   CARDIAC CATHETERIZATION     COLONOSCOPY     CORONARY STENT INTERVENTION N/A  09/03/2022   Procedure: CORONARY STENT INTERVENTION;  Surgeon: Marykay Lex, MD;  Location: [facility] INVASIVE CV LAB;  Service: Cardiovascular;  Laterality: N/A;   ELBOW ARTHROSCOPY     heart stents     2002,2004   LEFT HEART CATH AND CORONARY ANGIOGRAPHY N/A 09/03/2022   Procedure: LEFT HEART CATH AND CORONARY ANGIOGRAPHY;  Surgeon: Marykay Lex, MD;   Location: [facility] INVASIVE CV LAB;  Service: Cardiovascular;  Laterality: N/A;   SPINAL FUSION     SPINAL FUSION     SPINE SURGERY     cervical   UPPER GASTROINTESTINAL ENDOSCOPY        No outpatient medications have been marked as taking for the 07/20/23 encounter (Office Visit) with Burna Forts, MD.    ------------------------------------------------------------------------------------------------------  _____________________________________________________________ OBJECTIVE  PHYSICAL EXAM  Today's Vitals   07/20/23 0918  BP: 130/82  Weight: 169 lb (76.7 kg)  Height: 5\' 5"  (1.651 m)   Body mass index is 28.12 kg/m.   reviewed  General: A+Ox3, no acute distress, well-nourished, appropriate affect CV: pulses 2+ regular, nondiaphoretic, no peripheral edema, cap refill <2sec Lungs: no audible wheezing, non-labored breathing, bilateral chest rise/fall, nontachypneic Skin: warm, well-perfused, non-icteric, no susp lesions or rashes Neuro: no focal deficits. Sensation intact, muscle tone wnl, no atrophy Psych: no signs of depression or anxiety MSK:  R Hip: No deformity, swelling or wasting ROM Flexion 90, ext 30, IR 10, ER 45  Hamstring tightness b/l  Quad tightness with supine knee flexion 95 deg L, 85 deg R + trendelenberg b/l TTP R GT, TFL/ITB NTTP over the hip flexors, adductors, inguinal canal, pubic ramus, greater troch, glute musculature, si joint, lumbar spine SIJ rigidity + Negative log roll with FROM Negative FABER, provocative of lateral hip pain Negative FADIR, provocative of lateral hip pain + Ober test Negative axial loading, scour test Normal gait _____________________________________________________________ ASSESSMENT/PLAN Diagnoses and all orders for this visit:  Lateral pain of right hip -     DG HIP UNILAT WITH PELVIS 2-3 VIEWS RIGHT; Future -     Ambulatory referral to Physical Therapy  Consistent with GTPS 2/2 diminished hamstring/quad/TFL ROM,  glute weakness. XR to eval osseous pathology/calcific tendinopathy PT referral placed, continue with routine exercise/walking as tolerated Continue voltaren gel Follow up pending XR, discussed CSI vs POCUS/continuing PT, pt expressed interest in conservative treatment where able.  All questions answered, pt verbalized understanding and in agreement with plan  Electronically signed by: Burna Forts, MD 07/20/2023 9:57 AM

## 2023-07-28 NOTE — Therapy (Signed)
OUTPATIENT PHYSICAL THERAPY LOWER EXTREMITY EVALUATION   Patient Name: [name] MRN: [id number] DOB:04/13/1941, 82 y.o., male Today's Date: 08/06/2023   END OF SESSION:  PT End of Session - 08/06/23 0929     Visit Number 1    Date for PT Re-Evaluation 10/01/23    Authorization Type [employer]    PT Start Time 0929    PT Stop Time 1018    PT Time Calculation (min) 49 min    Activity Tolerance Patient tolerated treatment well    Behavior During Therapy [facility] for tasks assessed/performed             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 - decreased dose of HCTZ to M,F   Diverticulosis    DJD (degenerative joint disease)    Duodenal ulcer    ED (erectile dysfunction)    GERD (gastroesophageal reflux disease)    increased s/s in the last week 03-22-19 PV    Hemorrhoids    Hx of adenomatous colonic polyps    Hyperlipidemia    Hypertension    Myocardial infarction (HCC)    2002   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   mid frontal scalp-cx71fu   SCC (squamous cell carcinoma) 06/15/2019   front middle scalp-tx cx16fu   SCC (squamous cell carcinoma) 12/27/2002   CIS-right angle of jaw--cx44fu   SCC (squamous cell carcinoma) 09/24/2004   CIS--mid post crown, left mis crown medial,left first web space-tx cx16fu   SCC (squamous cell carcinoma) 06/03/2006   SCCA-right temple-tx cx19fu   SCC (squamous cell carcinoma) 06/03/2006   CIS-right forehead, left mis crown scalp medial-tx cx46fu   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/30/2006   right temple lateral-txpbx   SCC (squamous cell carcinoma) 11/10/2006   left forehead, left hand proximal   SCC (squamous  cell carcinoma) 07/02/2009   left scalp-cx37fu   SCC (squamous cell carcinoma) 01/24/2015   left scalp-cx30fu   SCC (squamous cell carcinoma) 11/12/2015   front lateralscalp-cx32fu, medial scalp-cx57fu   SCC (squamous cell carcinoma) 12/17/2016   front center scalp-cx58fu   Squamous cell carcinoma of skin 08/10/2017   CIS--right ear rim, top of scalp, left side scalp-tx-cx64fu   Past Surgical History:  Procedure Laterality Date   CARDIAC CATHETERIZATION     COLONOSCOPY     CORONARY STENT INTERVENTION N/A 09/03/2022   Procedure: CORONARY STENT INTERVENTION;  Surgeon: Marykay Lex, MD;  Location: [facility] INVASIVE CV LAB;  Service: Cardiovascular;  Laterality: N/A;   ELBOW ARTHROSCOPY     heart stents     2002,2004   LEFT HEART CATH AND CORONARY ANGIOGRAPHY N/A 09/03/2022   Procedure: LEFT HEART CATH AND CORONARY ANGIOGRAPHY;  Surgeon: Marykay Lex, MD;  Location: [facility] INVASIVE CV LAB;  Service: Cardiovascular;  Laterality: N/A;   SPINAL FUSION     SPINAL FUSION     SPINE SURGERY     cervical   UPPER GASTROINTESTINAL ENDOSCOPY     Patient Active Problem List   Diagnosis Date Noted   Elevated glucose 07/16/2023   Trochanteric bursitis of right hip 07/16/2023   Benign  essential tremor 04/14/2023   Microhematuria 04/14/2023   Diarrhea of presumed infectious origin 01/05/2023   Iron deficiency anemia secondary to inadequate dietary iron intake 09/23/2022   Presence of drug coated stent in left circumflex coronary artery 09/04/2022   Presence of bare metal stent in right coronary artery 09/04/2022   Presence of drug coated stent in LAD coronary artery 09/04/2022   Coronary artery disease involving native heart with unstable angina pectoris (HCC) 09/03/2022   Screening for diabetes mellitus 03/18/2022   Primary osteoarthritis involving multiple joints 03/18/2022   Macrocytic anemia 09/22/2016   Coarse tremors 06/10/2016   CAD (coronary artery disease) 05/01/2015   CKD (chronic  kidney disease), stage III (HCC) 05/01/2015   BPH with obstruction/lower urinary tract symptoms 11/13/2010   BARRETTS ESOPHAGUS 01/24/2010   Elevated cholesterol 09/21/2007   Chronic ischemic heart disease 09/21/2007   ERECTILE DYSFUNCTION 03/09/2007   Essential hypertension 03/09/2007    PCP: Mliss Sax, MD   REFERRING PROVIDER: Burna Forts, MD   REFERRING DIAG: [phone] (ICD-10-CM) - Lateral pain of right hip   THERAPY DIAG:  Pain in right hip  Muscle weakness (generalized)  Other abnormalities of gait and mobility  Stiffness of right hip, not elsewhere classified  Other muscle spasm  RATIONALE FOR EVALUATION AND TREATMENT: Rehabilitation  ONSET DATE: Chronic with exacerbation over past 6 months  NEXT MD VISIT: None scheduled   SUBJECTIVE:                                                                                                                                                                                                         SUBJECTIVE STATEMENT: Pt reports almost constant pain in R hip.  Goes to [facility] to walk ~1-1.2 mile 3x/wk followed by weight machines - typically 1 hour 3x/wk.  He places a lidocaine patch on his hip which typically allows him to complete the walk and exercise.  Also wears a sleeve brace on his R knee due to chronic knee issues.  Occasional radiation along R ITB.  PAIN: Are you having pain? Yes: NPRS scale:  2/10 Pain location: R lateral hip Pain description: constant ache, occasionally sharp Aggravating factors: walking w/o lidocaine patch Relieving factors: lidocaine patches, Voltaren gel  PERTINENT HISTORY:  CAD with MI 2002 s/p cardiac stenting, HTN, CKD-III, DJD, remote h/o cervical and lumbar fusions, GERD, multiple squamous cell cancer  PRECAUTIONS: None  RED FLAGS: None  WEIGHT BEARING RESTRICTIONS: No  FALLS:  Has patient fallen in last 6 months? No  LIVING ENVIRONMENT: Lives with: lives with  their  spouse Lives in: House/apartment Stairs: No Has following equipment at home: None  OCCUPATION: Retired  PLOF: Independent and Leisure: [employer] 3x/wk  PATIENT GOALS: "To be able to remain active w/o need for lidocaine patches."   OBJECTIVE: (objective measures completed at initial evaluation unless otherwise dated)  DIAGNOSTIC FINDINGS:  07/20/23 - DG Right Hip Unilat With Pelvis 2-3 Views:  IMPRESSION: 1. No explanation for patient's acute on chronic right hip pain. 2. Suspected degenerative change of the lower lumbar spine, incompletely evaluated. Further evaluation with dedicated lumbar spine radiographs could be performed as indicated.  PATIENT SURVEYS:  LEFS 62 / 80 = 77.5 %  COGNITION: Overall cognitive status: Within functional limits for tasks assessed    SENSATION: WFL  EDEMA:  N/A  POSTURE:  Slight flexed trunk   PALPATION: Denies TTP over B greater trochanter or glutes/piriformis but some increased muscle tension noted in glutes/piriformis and hip flexors, mostly on R  MUSCLE LENGTH: Hamstrings: mild/mod tight L>R ITB: mod tight R>L Piriformis: mod tight R>L Hip flexors: mod tight R>L Quads: mod tight R>L Heelcord: mild tight B  LOWER EXTREMITY ROM: Mildly limited hip ROM, R>L, with greatest limitation in B hip extension ER/IR  LOWER EXTREMITY MMT:  MMT Right eval Left eval  Hip flexion 4 4  Hip extension 4 * 4 *  Hip abduction 3- 3+  Hip adduction 4+ 4+  Hip internal rotation 4 4  Hip external rotation 3+ 3+  Knee flexion 4 5  Knee extension 4 5  Ankle dorsiflexion 4 5  Ankle plantarflexion 5 5  Ankle inversion    Ankle eversion     (Blank rows = not tested, * = limited ROM))  LOWER EXTREMITY SPECIAL TESTS:  Hip special tests: Ober's test: positive   FUNCTIONAL TESTS:  5 times sit to stand: 11.06 sec  GAIT: Distance walked: Clinic distances Assistive device utilized: None Level of assistance: Complete Independence Gait pattern:  WFL   TODAY'S TREATMENT:   08/06/23 - Eval THERAPEUTIC EXERCISE: to improve flexibility, strength and mobility.  Demonstration, verbal and tactile cues throughout for technique.  Hooklying HS stretch with strap x 30" Supine cross-body ITB stretch with strap x 30" Mod Thomas quad/hip flexor stretch with strap x 30" Hooklying figure-4 piriformis stretch with slight overpressure x 30" Hooklying KTOS piriformis stretch x 30" Hooklying LTR 5 x 10" S/L R RTB clam x 10 Bridge + RTB hip abduction isometric x 10   PATIENT EDUCATION:  Education details: PT eval findings, anticipated POC, and initial HEP Person educated: Patient Education method: Explanation, Demonstration, Verbal cues, Handouts, and information provided for MedBridge[employer] app/online access Education comprehension: verbalized understanding, returned demonstration, verbal cues required, and needs further education  HOME EXERCISE PROGRAM: Access Code: [email or web address] URL: [email or web address] Date: 08/06/2023 Prepared by: Glenetta Hew  Exercises - Hooklying Hamstring Stretch with Strap (Mirrored)  - 2 x daily - 7 x weekly - 3 reps - 30 sec hold - Supine Iliotibial Band Stretch with Strap  - 2 x daily - 7 x weekly - 3 reps - 30 sec hold - Hip Flexor Stretch with Strap on Table (Mirrored)  - 2 x daily - 7 x weekly - 3 reps - 30 sec hold - Supine Figure 4 Piriformis Stretch  - 2 x daily - 7 x weekly - 3 reps - 30 sec hold - Supine Piriformis Stretch with Foot on Ground  - 2 x daily - 7 x weekly - 3 reps - 30 sec hold -  Supine Lower Trunk Rotation  - 2 x daily - 7 x weekly - 5 reps - 10 sec hold - Clam with Resistance  - 1 x daily - 7 x weekly - 2 sets - 10 reps - 3-5 sec hold - Supine Bridge with Resistance Band  - 1 x daily - 7 x weekly - 2 sets - 10 reps - 5 sec hold   ASSESSMENT:  CLINICAL IMPRESSION: [name] is a 82 y.o. male who was referred to physical therapy for evaluation and treatment for acute on  chronic R lateral hip pain.  He reports the pain is mostly constant but does not let it interfere with his activities, using a lidocaine patch to be able to complete exercise 3x/wk at the [facility], although he does note having to reduce the distance that he walks due to this pain.  He also has chronic R knee OA which he manages with a compression knee brace and injections, but may have contributed to asymmetry of his hips due to favoring R knee.  Assessment revealing limited hip ROM with impaired proximal LE flexibility, increased muscle tension in glutes/piriformis and hip flexors, and R>L LE weakness.  Marteze "Joe" will benefit from skilled PT to address above deficits to improve mobility and activity tolerance with decreased pain interference.   OBJECTIVE IMPAIRMENTS: decreased activity tolerance, decreased knowledge of condition, decreased mobility, difficulty walking, decreased ROM, decreased strength, increased fascial restrictions, impaired perceived functional ability, increased muscle spasms, improper body mechanics, postural dysfunction, and pain.   ACTIVITY LIMITATIONS: bending, squatting, stairs, transfers, and locomotion level  PARTICIPATION LIMITATIONS: community activity  PERSONAL FACTORS: Age, Past/current experiences, Time since onset of injury/illness/exacerbation, and 3+ comorbidities: CAD with MI 2002 s/p cardiac stenting, HTN, CKD-III, DJD, remote h/o cervical and lumbar fusions, GERD, multiple squamous cell cancer  are also affecting patient's functional outcome.   REHAB POTENTIAL: Excellent  CLINICAL DECISION MAKING: Stable/uncomplicated  EVALUATION COMPLEXITY: Low   GOALS: Goals reviewed with patient? Yes  SHORT TERM GOALS: Target date: 09/03/2023  Patient will be independent with initial HEP. Baseline:  Goal status: INITIAL  LONG TERM GOALS: Target date: 10/01/2023   Patient will be independent with advanced/ongoing HEP to improve outcomes and carryover.  Baseline:   Goal status: INITIAL  2.  Patient will report at least 50-75% improvement in R hip pain to improve QOL. Baseline: 2/10 Goal status: INITIAL  3.  Patient will demonstrate improved B LE strength to >/= 4+/5 for improved stability and ease of mobility. Baseline: Refer to above LE MMT table Goal status: INITIAL  4.  Patient will be able to ambulate >1 mile on track at gym without increased pain or need for lidocaine patch to allow for regular exercise.  Baseline: Currently must wear lidocaine patch in order to complete workout at the [facility] including walking 1 mile on track Goal status: INITIAL  5.  Patient will report >/= 71/80 on LEFS to demonstrate improved functional ability. Baseline: 62 / 80 = 77.5 % Goal status: INITIAL   PLAN:  PT FREQUENCY: 1-2x/week  PT DURATION: 8 weeks  PLANNED INTERVENTIONS: 97164- PT Re-evaluation, 97110-Therapeutic exercises, 97530- Therapeutic activity, 97112- Neuromuscular re-education, 97535- Self Care, 16109- Manual therapy, [id number]- Gait training, 97014- Electrical stimulation (unattended), [id number]- Electrical stimulation (manual), [id number]- Ultrasound, 60454- Ionotophoresis 4mg /ml Dexamethasone, Patient/Family education, Balance training, Stair training, Taping, Dry Needling, Joint mobilization, Cryotherapy, and Moist heat  PLAN FOR NEXT SESSION: Review initial HEP; go over machines that he uses at the [facility] and  ensure proper alignment and set up; progress lumbopelvic and proximal LE flexibility and strengthening; MT +/- DN to address abnormal muscle tension in R hip musculature   Marry Guan, PT 08/06/2023, 11:56 AM

## 2023-08-06 ENCOUNTER — Other Ambulatory Visit: Payer: Self-pay

## 2023-08-06 ENCOUNTER — Ambulatory Visit: Payer: PPO | Attending: Family Medicine | Admitting: Physical Therapy

## 2023-08-06 ENCOUNTER — Encounter: Payer: Self-pay | Admitting: Physical Therapy

## 2023-08-06 DIAGNOSIS — M6281 Muscle weakness (generalized): Secondary | ICD-10-CM | POA: Diagnosis not present

## 2023-08-06 DIAGNOSIS — M62838 Other muscle spasm: Secondary | ICD-10-CM

## 2023-08-06 DIAGNOSIS — R2689 Other abnormalities of gait and mobility: Secondary | ICD-10-CM | POA: Diagnosis not present

## 2023-08-06 DIAGNOSIS — M25651 Stiffness of right hip, not elsewhere classified: Secondary | ICD-10-CM

## 2023-08-06 DIAGNOSIS — M25551 Pain in right hip: Secondary | ICD-10-CM

## 2023-08-13 ENCOUNTER — Ambulatory Visit: Payer: PPO

## 2023-08-13 DIAGNOSIS — M25551 Pain in right hip: Secondary | ICD-10-CM

## 2023-08-13 DIAGNOSIS — M62838 Other muscle spasm: Secondary | ICD-10-CM

## 2023-08-13 DIAGNOSIS — R2689 Other abnormalities of gait and mobility: Secondary | ICD-10-CM

## 2023-08-13 DIAGNOSIS — M25651 Stiffness of right hip, not elsewhere classified: Secondary | ICD-10-CM

## 2023-08-13 DIAGNOSIS — M6281 Muscle weakness (generalized): Secondary | ICD-10-CM

## 2023-08-13 NOTE — Therapy (Signed)
OUTPATIENT PHYSICAL THERAPY TREATEMNT   Patient Name: [name] MRN: [id number] DOB:07/12/1941, 82 y.o., male Today's Date: 08/13/2023   END OF SESSION:  PT End of Session - 08/13/23 1402     Visit Number 2    Date for PT Re-Evaluation 10/01/23    Authorization Type [employer]    Progress Note Due on Visit 10    PT Start Time 1315    PT Stop Time 1358    PT Time Calculation (min) 43 min    Activity Tolerance Patient tolerated treatment well    Behavior During Therapy WFL for tasks assessed/performed              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 - decreased dose of HCTZ to M,F   Diverticulosis    DJD (degenerative joint disease)    Duodenal ulcer    ED (erectile dysfunction)    GERD (gastroesophageal reflux disease)    increased s/s in the last week 03-22-19 PV    Hemorrhoids    Hx of adenomatous colonic polyps    Hyperlipidemia    Hypertension    Myocardial infarction (HCC)    2002   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   mid frontal scalp-cx24fu   SCC (squamous cell carcinoma) 06/15/2019   front middle scalp-tx cx29fu   SCC (squamous cell carcinoma) 12/27/2002   CIS-right angle of jaw--cx55fu   SCC (squamous cell carcinoma) 09/24/2004   CIS--mid post crown, left mis crown medial,left first web space-tx cx46fu   SCC (squamous cell carcinoma) 06/03/2006   SCCA-right temple-tx cx72fu   SCC (squamous cell carcinoma) 06/03/2006   CIS-right forehead, left mis crown scalp medial-tx cx58fu   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/30/2006   right temple lateral-txpbx   SCC (squamous cell carcinoma) 11/10/2006   left forehead, left hand proximal    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/02/2009   left scalp-cx40fu   SCC (squamous cell carcinoma) 01/24/2015   left scalp-cx58fu   SCC (squamous cell carcinoma) 11/12/2015   front lateralscalp-cx35fu, medial scalp-cx12fu   SCC (squamous cell carcinoma) 12/17/2016   front center scalp-cx53fu   Squamous cell carcinoma of skin 08/10/2017   CIS--right ear rim, top of scalp, left side scalp-tx-cx56fu   Past Surgical History:  Procedure Laterality Date   CARDIAC CATHETERIZATION     COLONOSCOPY     CORONARY STENT INTERVENTION N/A 09/03/2022   Procedure: CORONARY STENT INTERVENTION;  Surgeon: Marykay Lex, MD;  Location: [facility] INVASIVE CV LAB;  Service: Cardiovascular;  Laterality: N/A;   ELBOW ARTHROSCOPY     heart stents     2002,2004   LEFT HEART CATH AND CORONARY ANGIOGRAPHY N/A 09/03/2022   Procedure: LEFT HEART CATH AND CORONARY ANGIOGRAPHY;  Surgeon: Marykay Lex, MD;  Location: [facility] INVASIVE CV LAB;  Service: Cardiovascular;  Laterality: N/A;   SPINAL FUSION     SPINAL FUSION     SPINE SURGERY     cervical   UPPER GASTROINTESTINAL ENDOSCOPY     Patient Active Problem List   Diagnosis Date Noted   Elevated glucose 07/16/2023   Trochanteric  bursitis of right hip 07/16/2023   Benign essential tremor 04/14/2023   Microhematuria 04/14/2023   Diarrhea of presumed infectious origin 01/05/2023   Iron deficiency anemia secondary to inadequate dietary iron intake 09/23/2022   Presence of drug coated stent in left circumflex coronary artery 09/04/2022   Presence of bare metal stent in right coronary artery 09/04/2022   Presence of drug coated stent in LAD coronary artery 09/04/2022   Coronary artery disease involving native heart with unstable angina pectoris (HCC) 09/03/2022   Screening for diabetes mellitus 03/18/2022   Primary osteoarthritis involving multiple joints 03/18/2022   Macrocytic anemia 09/22/2016   Coarse tremors 06/10/2016   CAD (coronary artery disease) 05/01/2015    CKD (chronic kidney disease), stage III (HCC) 05/01/2015   BPH with obstruction/lower urinary tract symptoms 11/13/2010   BARRETTS ESOPHAGUS 01/24/2010   Elevated cholesterol 09/21/2007   Chronic ischemic heart disease 09/21/2007   ERECTILE DYSFUNCTION 03/09/2007   Essential hypertension 03/09/2007    PCP: Mliss Sax, MD   REFERRING PROVIDER: Burna Forts, MD   REFERRING DIAG: [phone] (ICD-10-CM) - Lateral pain of right hip   THERAPY DIAG:  Pain in right hip  Muscle weakness (generalized)  Other abnormalities of gait and mobility  Stiffness of right hip, not elsewhere classified  Other muscle spasm  RATIONALE FOR EVALUATION AND TREATMENT: Rehabilitation  ONSET DATE: Chronic with exacerbation over past 6 months  NEXT MD VISIT: None scheduled   SUBJECTIVE:                                                                                                                                                                                                         SUBJECTIVE STATEMENT: Pt reports almost constant pain in R hip.  Goes to [facility] to walk ~1-1.2 mile 3x/wk followed by weight machines - typically 1 hour 3x/wk.  He places a lidocaine patch on his hip which typically allows him to complete the walk and exercise.  Also wears a sleeve brace on his R knee due to chronic knee issues.  Occasional radiation along R ITB.  PAIN: Are you having pain? Yes: NPRS scale:  2/10 Pain location: R lateral hip Pain description: constant ache, occasionally sharp Aggravating factors: walking w/o lidocaine patch Relieving factors: lidocaine patches, Voltaren gel  PERTINENT HISTORY:  CAD with MI 2002 s/p cardiac stenting, HTN, CKD-III, DJD, remote h/o cervical and lumbar fusions, GERD, multiple squamous cell cancer  PRECAUTIONS: None  RED FLAGS: None  WEIGHT BEARING RESTRICTIONS: No  FALLS:  Has patient fallen in last 6 months?  No  LIVING ENVIRONMENT: Lives with: lives  with their spouse Lives in: House/apartment Stairs: No Has following equipment at home: None  OCCUPATION: Retired  PLOF: Independent and Leisure: [employer] 3x/wk  PATIENT GOALS: "To be able to remain active w/o need for lidocaine patches."   OBJECTIVE: (objective measures completed at initial evaluation unless otherwise dated)  DIAGNOSTIC FINDINGS:  07/20/23 - DG Right Hip Unilat With Pelvis 2-3 Views:  IMPRESSION: 1. No explanation for patient's acute on chronic right hip pain. 2. Suspected degenerative change of the lower lumbar spine, incompletely evaluated. Further evaluation with dedicated lumbar spine radiographs could be performed as indicated.  PATIENT SURVEYS:  LEFS 62 / 80 = 77.5 %  COGNITION: Overall cognitive status: Within functional limits for tasks assessed    SENSATION: WFL  EDEMA:  N/A  POSTURE:  Slight flexed trunk   PALPATION: Denies TTP over B greater trochanter or glutes/piriformis but some increased muscle tension noted in glutes/piriformis and hip flexors, mostly on R  MUSCLE LENGTH: Hamstrings: mild/mod tight L>R ITB: mod tight R>L Piriformis: mod tight R>L Hip flexors: mod tight R>L Quads: mod tight R>L Heelcord: mild tight B  LOWER EXTREMITY ROM: Mildly limited hip ROM, R>L, with greatest limitation in B hip extension ER/IR  LOWER EXTREMITY MMT:  MMT Right eval Left eval  Hip flexion 4 4  Hip extension 4 * 4 *  Hip abduction 3- 3+  Hip adduction 4+ 4+  Hip internal rotation 4 4  Hip external rotation 3+ 3+  Knee flexion 4 5  Knee extension 4 5  Ankle dorsiflexion 4 5  Ankle plantarflexion 5 5  Ankle inversion    Ankle eversion     (Blank rows = not tested, * = limited ROM))  LOWER EXTREMITY SPECIAL TESTS:  Hip special tests: Ober's test: positive   FUNCTIONAL TESTS:  5 times sit to stand: 11.06 sec  GAIT: Distance walked: Clinic distances Assistive device utilized: None Level of assistance: Complete Independence Gait  pattern: WFL   TODAY'S TREATMENT:  08/13/23 THERAPEUTIC EXERCISE: to improve flexibility, strength and mobility.  Demonstration, verbal and tactile cues throughout for technique.  Lat pulls and rows reviewed for gym Supine LTR x 10 each way Bridges 2 x 10  S/L clamshells RTB x 10 S/L R hip abduction x 15 Prone R hip extension x 10; L x 8     08/06/23 - Eval THERAPEUTIC EXERCISE: to improve flexibility, strength and mobility.  Demonstration, verbal and tactile cues throughout for technique.  Hooklying HS stretch with strap x 30" Supine cross-body ITB stretch with strap x 30" Mod Thomas quad/hip flexor stretch with strap x 30" Hooklying figure-4 piriformis stretch with slight overpressure x 30" Hooklying KTOS piriformis stretch x 30" Hooklying LTR 5 x 10" S/L R RTB clam x 10 Bridge + RTB hip abduction isometric x 10   PATIENT EDUCATION:  Education details: PT eval findings, anticipated POC, and initial HEP Person educated: Patient Education method: Explanation, Demonstration, Verbal cues, Handouts, and information provided for MedBridge[employer] app/online access Education comprehension: verbalized understanding, returned demonstration, verbal cues required, and needs further education  HOME EXERCISE PROGRAM: Access Code: [email or web address] URL: [email or web address] Date: 08/06/2023 Prepared by: Glenetta Hew  Exercises - Hooklying Hamstring Stretch with Strap (Mirrored)  - 2 x daily - 7 x weekly - 3 reps - 30 sec hold - Supine Iliotibial Band Stretch with Strap  - 2 x daily - 7 x weekly - 3 reps - 30 sec hold - Hip Flexor Stretch  with Strap on Table (Mirrored)  - 2 x daily - 7 x weekly - 3 reps - 30 sec hold - Supine Figure 4 Piriformis Stretch  - 2 x daily - 7 x weekly - 3 reps - 30 sec hold - Supine Piriformis Stretch with Foot on Ground  - 2 x daily - 7 x weekly - 3 reps - 30 sec hold - Supine Lower Trunk Rotation  - 2 x daily - 7 x weekly - 5 reps - 10 sec hold - Clam  with Resistance  - 1 x daily - 7 x weekly - 2 sets - 10 reps - 3-5 sec hold - Supine Bridge with Resistance Band  - 1 x daily - 7 x weekly - 2 sets - 10 reps - 5 sec hold   ASSESSMENT:  CLINICAL IMPRESSION: Pt responded well to treatment. Worked on strengthening for hips and knees. Cues provided as needed for form and technique. Lennell "Joe" will benefit from skilled PT to address above deficits to improve mobility and activity tolerance with decreased pain interference.   OBJECTIVE IMPAIRMENTS: decreased activity tolerance, decreased knowledge of condition, decreased mobility, difficulty walking, decreased ROM, decreased strength, increased fascial restrictions, impaired perceived functional ability, increased muscle spasms, improper body mechanics, postural dysfunction, and pain.   ACTIVITY LIMITATIONS: bending, squatting, stairs, transfers, and locomotion level  PARTICIPATION LIMITATIONS: community activity  PERSONAL FACTORS: Age, Past/current experiences, Time since onset of injury/illness/exacerbation, and 3+ comorbidities: CAD with MI 2002 s/p cardiac stenting, HTN, CKD-III, DJD, remote h/o cervical and lumbar fusions, GERD, multiple squamous cell cancer  are also affecting patient's functional outcome.   REHAB POTENTIAL: Excellent  CLINICAL DECISION MAKING: Stable/uncomplicated  EVALUATION COMPLEXITY: Low   GOALS: Goals reviewed with patient? Yes  SHORT TERM GOALS: Target date: 09/03/2023  Patient will be independent with initial HEP. Baseline:  Goal status: INITIAL  LONG TERM GOALS: Target date: 10/01/2023   Patient will be independent with advanced/ongoing HEP to improve outcomes and carryover.  Baseline:  Goal status: INITIAL  2.  Patient will report at least 50-75% improvement in R hip pain to improve QOL. Baseline: 2/10 Goal status: INITIAL  3.  Patient will demonstrate improved B LE strength to >/= 4+/5 for improved stability and ease of mobility. Baseline: Refer  to above LE MMT table Goal status: INITIAL  4.  Patient will be able to ambulate >1 mile on track at gym without increased pain or need for lidocaine patch to allow for regular exercise.  Baseline: Currently must wear lidocaine patch in order to complete workout at the [facility] including walking 1 mile on track Goal status: INITIAL  5.  Patient will report >/= 71/80 on LEFS to demonstrate improved functional ability. Baseline: 62 / 80 = 77.5 % Goal status: INITIAL   PLAN:  PT FREQUENCY: 1-2x/week  PT DURATION: 8 weeks  PLANNED INTERVENTIONS: 97164- PT Re-evaluation, 97110-Therapeutic exercises, 97530- Therapeutic activity, 97112- Neuromuscular re-education, 97535- Self Care, 16109- Manual therapy, [id number]- Gait training, 97014- Electrical stimulation (unattended), [phone]- Electrical stimulation (manual), [id number]- Ultrasound, 09811- Ionotophoresis 4mg /ml Dexamethasone, Patient/Family education, Balance training, Stair training, Taping, Dry Needling, Joint mobilization, Cryotherapy, and Moist heat  PLAN FOR NEXT SESSION: Review initial HEP; go over machines that he uses at the [facility] and ensure proper alignment and set up; progress lumbopelvic and proximal LE flexibility and strengthening; MT +/- DN to address abnormal muscle tension in R hip musculature   Neftali Thurow L Trudie Cervantes, PTA 08/13/2023, 2:03 PM

## 2023-08-27 ENCOUNTER — Encounter: Payer: Self-pay | Admitting: Physical Therapy

## 2023-08-27 ENCOUNTER — Ambulatory Visit: Payer: PPO | Attending: Family Medicine | Admitting: Physical Therapy

## 2023-08-27 DIAGNOSIS — M25651 Stiffness of right hip, not elsewhere classified: Secondary | ICD-10-CM | POA: Diagnosis not present

## 2023-08-27 DIAGNOSIS — R2689 Other abnormalities of gait and mobility: Secondary | ICD-10-CM | POA: Insufficient documentation

## 2023-08-27 DIAGNOSIS — M25551 Pain in right hip: Secondary | ICD-10-CM | POA: Insufficient documentation

## 2023-08-27 DIAGNOSIS — M6281 Muscle weakness (generalized): Secondary | ICD-10-CM | POA: Diagnosis not present

## 2023-08-27 DIAGNOSIS — M62838 Other muscle spasm: Secondary | ICD-10-CM | POA: Diagnosis not present

## 2023-08-27 NOTE — Therapy (Signed)
 OUTPATIENT PHYSICAL THERAPY TREATEMNT   Patient Name: AMMAR MOFFATT MRN: [id number] DOB:11-07-1940, 83 y.o., male Today's Date: 08/27/2023   END OF SESSION:  PT End of Session - 08/27/23 0931     Visit Number 3    Date for PT Re-Evaluation 10/01/23    Authorization Type [employer]    Progress Note Due on Visit 10    PT Start Time 0931    PT Stop Time 1015    PT Time Calculation (min) 44 min    Activity Tolerance Patient tolerated treatment well    Behavior During Therapy [facility] for tasks assessed/performed               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 - decreased dose of HCTZ to M,F   Diverticulosis    DJD (degenerative joint disease)    Duodenal ulcer    ED (erectile dysfunction)    GERD (gastroesophageal reflux disease)    increased s/s in the last week 03-22-19 PV    Hemorrhoids    Hx of adenomatous colonic polyps    Hyperlipidemia    Hypertension    Myocardial infarction (HCC)    2002   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   mid frontal scalp-[id number]   SCC (squamous cell carcinoma) 06/15/2019   front middle scalp-tx cx54fu   SCC (squamous cell carcinoma) 12/27/2002   CIS-right angle of jaw--cx56fu   SCC (squamous cell carcinoma) 09/24/2004   CIS--mid post crown, left mis crown medial,left first web space-tx cx69fu   SCC (squamous cell carcinoma) 06/03/2006   SCCA-right temple-tx cx65fu   SCC (squamous cell carcinoma) 06/03/2006   CIS-right forehead, left mis crown scalp medial-tx cx71fu   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/30/2006   right temple lateral-txpbx   SCC (squamous cell carcinoma) 11/10/2006   left forehead, left hand proximal    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/02/2009   left scalp-cx67fu   SCC (squamous cell carcinoma) 01/24/2015   left scalp-cx30fu   SCC (squamous cell carcinoma) 11/12/2015   front lateralscalp-cx47fu, medial scalp-cx52fu   SCC (squamous cell carcinoma) 12/17/2016   front center scalp-cx52fu   Squamous cell carcinoma of skin 08/10/2017   CIS--right ear rim, top of scalp, left side scalp-tx-cx79fu   Past Surgical History:  Procedure Laterality Date   CARDIAC CATHETERIZATION     COLONOSCOPY     CORONARY STENT INTERVENTION N/A 09/03/2022   Procedure: CORONARY STENT INTERVENTION;  Surgeon: Anner Alm ORN, MD;  Location: [facility] INVASIVE CV LAB;  Service: Cardiovascular;  Laterality: N/A;   ELBOW ARTHROSCOPY     heart stents     2002,2004   LEFT HEART CATH AND CORONARY ANGIOGRAPHY N/A 09/03/2022   Procedure: LEFT HEART CATH AND CORONARY ANGIOGRAPHY;  Surgeon: Anner Alm ORN, MD;  Location: [facility] INVASIVE CV LAB;  Service: Cardiovascular;  Laterality: N/A;   SPINAL FUSION     SPINAL FUSION     SPINE SURGERY     cervical   UPPER GASTROINTESTINAL ENDOSCOPY     Patient Active Problem List   Diagnosis Date Noted   Elevated glucose 07/16/2023  Trochanteric bursitis of right hip 07/16/2023   Benign essential tremor 04/14/2023   Microhematuria 04/14/2023   Diarrhea of presumed infectious origin 01/05/2023   Iron  deficiency anemia secondary to inadequate dietary iron  intake 09/23/2022   Presence of drug coated stent in left circumflex coronary artery 09/04/2022   Presence of bare metal stent in right coronary artery 09/04/2022   Presence of drug coated stent in LAD coronary artery 09/04/2022   Coronary artery disease involving native heart with unstable angina pectoris (HCC) 09/03/2022   Screening for diabetes mellitus 03/18/2022   Primary osteoarthritis involving multiple joints 03/18/2022   Macrocytic anemia 09/22/2016   Coarse tremors 06/10/2016   CAD (coronary artery disease) 05/01/2015    CKD (chronic kidney disease), stage III (HCC) 05/01/2015   BPH with obstruction/lower urinary tract symptoms 11/13/2010   BARRETTS ESOPHAGUS 01/24/2010   Elevated cholesterol 09/21/2007   Chronic ischemic heart disease 09/21/2007   ERECTILE DYSFUNCTION 03/09/2007   Essential hypertension 03/09/2007    PCP: Berneta Elsie Sayre, MD   REFERRING PROVIDER: Marcene Benedict ORN, MD   REFERRING DIAG: [phone] (ICD-10-CM) - Lateral pain of right hip   THERAPY DIAG:  Pain in right hip  Muscle weakness (generalized)  Other abnormalities of gait and mobility  Stiffness of right hip, not elsewhere classified  Other muscle spasm  RATIONALE FOR EVALUATION AND TREATMENT: Rehabilitation  ONSET DATE: Chronic with exacerbation over past 6 months  NEXT MD VISIT: None scheduled   SUBJECTIVE:                                                                                                                                                                                                         SUBJECTIVE STATEMENT: Pt reports the HEP is fine when I have a chance to do them - Only tried them 4-5 times.  EVAL:  Pt reports almost constant pain in R hip.  Goes to [facility] to walk ~1-1.2 mile 3x/wk followed by weight machines - typically 1 hour 3x/wk.  He places a lidocaine  patch on his hip which typically allows him to complete the walk and exercise.  Also wears a sleeve brace on his R knee due to chronic knee issues.  Occasional radiation along R ITB.  PAIN: Are you having pain? Yes: NPRS scale: </=2/10 Pain location: R lateral hip Pain description: constant ache, occasionally sharp Aggravating factors: walking w/o lidocaine  patch Relieving factors: lidocaine  patches, Voltaren gel  PERTINENT HISTORY:  CAD with MI 2002 s/p cardiac stenting, HTN, CKD-III, DJD, remote h/o cervical and lumbar fusions, GERD, multiple squamous cell  cancer  PRECAUTIONS: None  RED FLAGS: None  WEIGHT BEARING  RESTRICTIONS: No  FALLS:  Has patient fallen in last 6 months? No  LIVING ENVIRONMENT: Lives with: lives with their spouse Lives in: House/apartment Stairs: No Has following equipment at home: None  OCCUPATION: Retired  PLOF: Independent and Leisure: [employer] 3x/wk  PATIENT GOALS: To be able to remain active w/o need for lidocaine  patches.   OBJECTIVE: (objective measures completed at initial evaluation unless otherwise dated)  DIAGNOSTIC FINDINGS:  07/20/23 - DG Right Hip Unilat With Pelvis 2-3 Views:  IMPRESSION: 1. No explanation for patient's acute on chronic right hip pain. 2. Suspected degenerative change of the lower lumbar spine, incompletely evaluated. Further evaluation with dedicated lumbar spine radiographs could be performed as indicated.  PATIENT SURVEYS:  LEFS 62 / 80 = 77.5 %  COGNITION: Overall cognitive status: Within functional limits for tasks assessed    SENSATION: WFL  EDEMA:  N/A  POSTURE:  Slight flexed trunk   PALPATION: Denies TTP over B greater trochanter or glutes/piriformis but some increased muscle tension noted in glutes/piriformis and hip flexors, mostly on R  MUSCLE LENGTH: Hamstrings: mild/mod tight L>R ITB: mod tight R>L Piriformis: mod tight R>L Hip flexors: mod tight R>L Quads: mod tight R>L Heelcord: mild tight B  LOWER EXTREMITY ROM: Mildly limited hip ROM, R>L, with greatest limitation in B hip extension ER/IR  LOWER EXTREMITY MMT:  MMT Right eval Left eval  Hip flexion 4 4  Hip extension 4 * 4 *  Hip abduction 3- 3+  Hip adduction 4+ 4+  Hip internal rotation 4 4  Hip external rotation 3+ 3+  Knee flexion 4 5  Knee extension 4 5  Ankle dorsiflexion 4 5  Ankle plantarflexion 5 5  Ankle inversion    Ankle eversion     (Blank rows = not tested, * = limited ROM))  LOWER EXTREMITY SPECIAL TESTS:  Hip special tests: Ober's test: positive   FUNCTIONAL TESTS:  5 times sit to stand: 11.06 sec  GAIT: Distance  walked: Clinic distances Assistive device utilized: None Level of assistance: Complete Independence Gait pattern: WFL   TODAY'S TREATMENT:   08/27/23 THERAPEUTIC EXERCISE: to improve flexibility, strength and mobility.  Demonstration, verbal and tactile cues throughout for technique.  TM 1.9 mph x 6 min Hooklying HS stretch with strap x 30 Supine cross-body ITB stretch with strap x 30 Mod Thomas quad/hip flexor stretch with strap x 30 Hooklying figure-4 piriformis stretch with slight overpressure x 30 Hooklying KTOS piriformis stretch x 30 Hooklying LTR 5 x 10-15 Bridge + RTB hip abduction isometric 10 x 5 S/L RTB clam x 10 bil B side-stepping with looped RTB at ankles 3 x 10 ft along counter Fwd monster walk with looped RTB at ankles x 10 ft along counter Standing alt hip extension+abduction with looped RTB at ankles x 15 bil - cues to avoid forward or lateral lean over counter BATCA knee extension B con/ecc 20# x 10, 10# B con/L ecc 10# x 10 BATCA knee flexion B con/ecc 20# x 10, 15# B con/L ecc 10# x 10   08/13/23 THERAPEUTIC EXERCISE: to improve flexibility, strength and mobility.  Demonstration, verbal and tactile cues throughout for technique.  Lat pulls and rows reviewed for gym Supine LTR x 10 each way Bridges 2 x 10  S/L clamshells RTB x 10 S/L R hip abduction x 15 Prone R hip extension x 10; L x 8     08/06/23 - Eval  THERAPEUTIC EXERCISE: to improve flexibility, strength and mobility.  Demonstration, verbal and tactile cues throughout for technique.  Hooklying HS stretch with strap x 30 Supine cross-body ITB stretch with strap x 30 Mod Thomas quad/hip flexor stretch with strap x 30 Hooklying figure-4 piriformis stretch with slight overpressure x 30 Hooklying KTOS piriformis stretch x 30 Hooklying LTR 5 x 10 S/L R RTB clam x 10 Bridge + RTB hip abduction isometric x 10   PATIENT EDUCATION:  Education details: HEP review and continue with current  HEP Person educated: Patient Education method: Explanation, Demonstration, Verbal cues, and pt using MedBridgeGO app Education comprehension: verbalized understanding, returned demonstration, verbal cues required, and needs further education  HOME EXERCISE PROGRAM: *Access Code: [email or web address] URL: [email or web address] Date: 08/06/2023 Prepared by: Elijah Hidden  Exercises - Hooklying Hamstring Stretch with Strap (Mirrored)  - 2 x daily - 7 x weekly - 3 reps - 30 sec hold - Supine Iliotibial Band Stretch with Strap  - 2 x daily - 7 x weekly - 3 reps - 30 sec hold - Hip Flexor Stretch with Strap on Table (Mirrored)  - 2 x daily - 7 x weekly - 3 reps - 30 sec hold - Supine Figure 4 Piriformis Stretch  - 2 x daily - 7 x weekly - 3 reps - 30 sec hold - Supine Piriformis Stretch with Foot on Ground  - 2 x daily - 7 x weekly - 3 reps - 30 sec hold - Supine Lower Trunk Rotation  - 2 x daily - 7 x weekly - 5 reps - 10 sec hold - Clam with Resistance  - 1 x daily - 7 x weekly - 2 sets - 10 reps - 3-5 sec hold - Supine Bridge with Resistance Band  - 1 x daily - 7 x weekly - 2 sets - 10 reps - 5 sec hold  *Patient using MedBridgeGO app  ASSESSMENT:  CLINICAL IMPRESSION: Carla Whilden admits to limited performance of HEP over the holidays due to being very busy with family and traveling, however denies any concerns when he has attempted to HEP.  Brief review of HEP revealing good return demonstration of exercises - STG met.  Patient reminded to inform PT if any concerns do arise with HEP or if in need of progression of resistance.  Progression of strengthening exercises targeting lateral hip weakness with patient needing to transition to supported resisted sidestepping as well as stationary hip extension/abduction rather than retromonster walk due to concerns for balance.  Reviewed use of gym exercise equipment ensuring patient understanding of proper alignment of knees with hinge on weight  machines, as well as ways to narrow focus to address isolated weaknesses.  Joe will benefit from continued skilled PT to address above deficits to improve mobility and activity tolerance with decreased pain interference.   OBJECTIVE IMPAIRMENTS: decreased activity tolerance, decreased knowledge of condition, decreased mobility, difficulty walking, decreased ROM, decreased strength, increased fascial restrictions, impaired perceived functional ability, increased muscle spasms, improper body mechanics, postural dysfunction, and pain.   ACTIVITY LIMITATIONS: bending, squatting, stairs, transfers, and locomotion level  PARTICIPATION LIMITATIONS: community activity  PERSONAL FACTORS: Age, Past/current experiences, Time since onset of injury/illness/exacerbation, and 3+ comorbidities: CAD with MI 2002 s/p cardiac stenting, HTN, CKD-III, DJD, remote h/o cervical and lumbar fusions, GERD, multiple squamous cell cancer  are also affecting patient's functional outcome.   REHAB POTENTIAL: Excellent  CLINICAL DECISION MAKING: Stable/uncomplicated  EVALUATION COMPLEXITY: Low   GOALS: Goals reviewed with patient? Yes  SHORT TERM GOALS: Target date: 09/03/2023  Patient will be independent with initial HEP. Baseline:  Goal status: MET - 08/27/23  LONG TERM GOALS: Target date: 10/01/2023   Patient will be independent with advanced/ongoing HEP to improve outcomes and carryover.  Baseline:  Goal status: IN PROGRESS  2.  Patient will report at least 50-75% improvement in R hip pain to improve QOL. Baseline: 2/10 Goal status: IN PROGRESS  3.  Patient will demonstrate improved B LE strength to >/= 4+/5 for improved stability and ease of mobility. Baseline: Refer to above LE MMT table Goal status: IN PROGRESS  4.  Patient will be able to ambulate >1 mile on track at gym without increased pain or need for lidocaine  patch to allow for regular exercise.  Baseline: Currently must wear lidocaine  patch in  order to complete workout at the [facility] including walking 1 mile on track Goal status: IN PROGRESS  5.  Patient will report >/= 71/80 on LEFS to demonstrate improved functional ability. Baseline: 62 / 80 = 77.5 % Goal status: IN PROGRESS   PLAN:  PT FREQUENCY: 1-2x/week  PT DURATION: 8 weeks  PLANNED INTERVENTIONS: 97164- PT Re-evaluation, 97110-Therapeutic exercises, 97530- Therapeutic activity, 97112- Neuromuscular re-education, 97535- Self Care, 02859- Manual therapy, [id number]- Gait training, 97014- Electrical stimulation (unattended), [phone]- Electrical stimulation (manual), [id number]- Ultrasound, 02966- Ionotophoresis 4mg /ml Dexamethasone, Patient/Family education, Balance training, Stair training, Taping, Dry Needling, Joint mobilization, Cryotherapy, and Moist heat  PLAN FOR NEXT SESSION: progress lumbopelvic and proximal LE flexibility and strengthening with emphasis on hip extension and lateral/rotational motions - review/update HEP as indicated; MT +/- DN to address abnormal muscle tension in R hip musculature   Elijah CHRISTELLA Hidden, PT 08/27/2023, 3:28 PM

## 2023-08-31 NOTE — Progress Notes (Signed)
  Cardiology Office Note:   Date:  09/03/2023  ID:  [name], DOB June 10, 1941, MRN [id number] PCP: Berneta Elsie Sayre, MD  [facility] Providers Cardiologist:  Lynwood Schilling, MD Cardiology APP:  Madie Jon Garre, [place] {  History of Present Illness:   [name] is a 83 y.o. male who presents for follow up of his CAD.  He had BMS to RCA in 10/2000, DES to 1st Diag and PTCA to LCX in 06/2005, and most recently a complex bifurcation two vessel PCI with DES to LAD and DES to ostial 1st Diag in 08/2022.   He presents for follow up.    Since I last saw him he has done well. The patient denies any new symptoms such as chest discomfort, neck or arm discomfort. There has been no new shortness of breath, PND or orthopnea. There have been no reported palpitations, presyncope or syncope.  He does exercise at the [facility].  He has no symptoms related to this.  ROS: As stated in the HPI and negative for all other systems.  Studies Reviewed:    EKG:   EKG Interpretation Date/Time:  Thursday September 03 2023 10:32:03 EST Ventricular Rate:  45 PR Interval:  220 QRS Duration:  90 QT Interval:  470 QTC Calculation: 406 R Axis:   -7  Text Interpretation: Sinus bradycardia with 1st degree A-V block When compared with ECG of 16-Oct-2022 11:51, Criteria for Inferior infarct are no longer Present No significant change since last tracing Confirmed by Schilling Lynwood ([id number]) on 09/03/2023 11:03:08 AM    Risk Assessment/Calculations:              Physical Exam:   VS:  BP 114/78 (BP Location: Left Arm, Patient Position: Sitting, Cuff Size: Normal)   Pulse (!) 45   Ht 5' 5 (1.651 m)   Wt 167 lb (75.8 kg)   BMI 27.79 kg/m    Wt Readings from Last 3 Encounters:  09/03/23 167 lb (75.8 kg)  07/20/23 169 lb (76.7 kg)  07/16/23 169 lb 12.8 oz (77 kg)     GEN: Well nourished, well developed in no acute distress NECK: No JVD; No carotid bruits CARDIAC: RRR, 2 out of 6 diastolic murmur  heard best at the third left intercostal space and lasting long into diastole, no systolic murmurs, rubs, gallops RESPIRATORY:  Clear to auscultation without rales, wheezing or rhonchi  ABDOMEN: Soft, non-tender, non-distended EXTREMITIES:  No edema; No deformity   ASSESSMENT AND PLAN:   CAD:  The patient has no new sypmtoms.  No further cardiovascular testing is indicated.  We will continue with aggressive risk reduction.  He can stop his Plavix .     Hypertension:   The blood pressure is at target.  No change in therapy.   Hyperlipidemia:   LDL was 50 with an HDL of 44.  No change in therapy.   AI: I will follow-up with an echocardiogram.  Follow up with me in 1 year  Signed, Lynwood Schilling, MD

## 2023-09-02 ENCOUNTER — Ambulatory Visit: Payer: PPO | Admitting: Physical Therapy

## 2023-09-02 ENCOUNTER — Encounter: Payer: Self-pay | Admitting: Physical Therapy

## 2023-09-02 DIAGNOSIS — M6281 Muscle weakness (generalized): Secondary | ICD-10-CM

## 2023-09-02 DIAGNOSIS — M62838 Other muscle spasm: Secondary | ICD-10-CM

## 2023-09-02 DIAGNOSIS — M25551 Pain in right hip: Secondary | ICD-10-CM

## 2023-09-02 DIAGNOSIS — R2689 Other abnormalities of gait and mobility: Secondary | ICD-10-CM

## 2023-09-02 DIAGNOSIS — M25651 Stiffness of right hip, not elsewhere classified: Secondary | ICD-10-CM

## 2023-09-02 NOTE — Therapy (Signed)
 OUTPATIENT PHYSICAL THERAPY TREATEMNT   Patient Name: [name] MRN: [id number] DOB:05/28/1941, 83 y.o., male Today's Date: 09/02/2023   END OF SESSION:  PT End of Session - 09/02/23 0935     Visit Number 4    Date for PT Re-Evaluation 10/01/23    Authorization Type [employer]    Progress Note Due on Visit 10    PT Start Time 0935    PT Stop Time 1016    PT Time Calculation (min) 41 min    Activity Tolerance Patient tolerated treatment well    Behavior During Therapy [facility] for tasks assessed/performed                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 - decreased dose of HCTZ to M,F   Diverticulosis    DJD (degenerative joint disease)    Duodenal ulcer    ED (erectile dysfunction)    GERD (gastroesophageal reflux disease)    increased s/s in the last week 03-22-19 PV    Hemorrhoids    Hx of adenomatous colonic polyps    Hyperlipidemia    Hypertension    Myocardial infarction (HCC)    2002   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   mid frontal scalp-cx38fu   SCC (squamous cell carcinoma) 06/15/2019   front middle scalp-tx cx61fu   SCC (squamous cell carcinoma) 12/27/2002   CIS-right angle of jaw--cx92fu   SCC (squamous cell carcinoma) 09/24/2004   CIS--mid post crown, left mis crown medial,left first web space-tx cx39fu   SCC (squamous cell carcinoma) 06/03/2006   SCCA-right temple-tx cx98fu   SCC (squamous cell carcinoma) 06/03/2006   CIS-right forehead, left mis crown scalp medial-tx cx28fu   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/30/2006   right temple lateral-txpbx   SCC (squamous cell carcinoma) 11/10/2006   left forehead, left hand  proximal   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/02/2009   left scalp-cx43fu   SCC (squamous cell carcinoma) 01/24/2015   left scalp-cx38fu   SCC (squamous cell carcinoma) 11/12/2015   front lateralscalp-cx42fu, medial scalp-cx69fu   SCC (squamous cell carcinoma) 12/17/2016   front center scalp-cx78fu   Squamous cell carcinoma of skin 08/10/2017   CIS--right ear rim, top of scalp, left side scalp-tx-cx70fu   Past Surgical History:  Procedure Laterality Date   CARDIAC CATHETERIZATION     COLONOSCOPY     CORONARY STENT INTERVENTION N/A 09/03/2022   Procedure: CORONARY STENT INTERVENTION;  Surgeon: Anner Alm ORN, MD;  Location: [facility] INVASIVE CV LAB;  Service: Cardiovascular;  Laterality: N/A;   ELBOW ARTHROSCOPY     heart stents     2002,2004   LEFT HEART CATH AND CORONARY ANGIOGRAPHY N/A 09/03/2022   Procedure: LEFT HEART CATH AND CORONARY ANGIOGRAPHY;  Surgeon: Anner Alm ORN, MD;  Location: [facility] INVASIVE CV LAB;  Service: Cardiovascular;  Laterality: N/A;   SPINAL FUSION     SPINAL FUSION     SPINE SURGERY     cervical   UPPER GASTROINTESTINAL ENDOSCOPY     Patient Active Problem List   Diagnosis Date Noted   Elevated glucose 07/16/2023  Trochanteric bursitis of right hip 07/16/2023   Benign essential tremor 04/14/2023   Microhematuria 04/14/2023   Diarrhea of presumed infectious origin 01/05/2023   Iron  deficiency anemia secondary to inadequate dietary iron  intake 09/23/2022   Presence of drug coated stent in left circumflex coronary artery 09/04/2022   Presence of bare metal stent in right coronary artery 09/04/2022   Presence of drug coated stent in LAD coronary artery 09/04/2022   Coronary artery disease involving native heart with unstable angina pectoris (HCC) 09/03/2022   Screening for diabetes mellitus 03/18/2022   Primary osteoarthritis involving multiple joints 03/18/2022   Macrocytic anemia 09/22/2016   Coarse tremors 06/10/2016   CAD (coronary artery disease)  05/01/2015   CKD (chronic kidney disease), stage III (HCC) 05/01/2015   BPH with obstruction/lower urinary tract symptoms 11/13/2010   BARRETTS ESOPHAGUS 01/24/2010   Elevated cholesterol 09/21/2007   Chronic ischemic heart disease 09/21/2007   ERECTILE DYSFUNCTION 03/09/2007   Essential hypertension 03/09/2007    PCP: Berneta Elsie Sayre, MD   REFERRING PROVIDER: Marcene Benedict ORN, MD   REFERRING DIAG: [phone] (ICD-10-CM) - Lateral pain of right hip   THERAPY DIAG:  Pain in right hip  Muscle weakness (generalized)  Other abnormalities of gait and mobility  Stiffness of right hip, not elsewhere classified  Other muscle spasm  RATIONALE FOR EVALUATION AND TREATMENT: Rehabilitation  ONSET DATE: Chronic with exacerbation over past 6 months  NEXT MD VISIT: None scheduled   SUBJECTIVE:                                                                                                                                                                                                         SUBJECTIVE STATEMENT: Pt reports some pain in his proximal posterior thighs today - states this is not unusual for him.  EVAL:  Pt reports almost constant pain in R hip.  [facility] to [facility] to walk ~1-1.2 mile 3x/wk followed by weight machines - typically 1 hour 3x/wk.  He places a lidocaine  patch on his hip which typically allows him to complete the walk and exercise.  Also wears a sleeve brace on his R knee due to chronic knee issues.  Occasional radiation along R ITB.  PAIN: Are you having pain? Yes: NPRS scale:  2/10 Pain location: B proximal posterior thighs & R hip Pain description: constant ache, occasionally sharp Aggravating factors: walking w/o lidocaine  patch Relieving factors: lidocaine  patches, Voltaren gel  PERTINENT HISTORY:  CAD with MI 2002 s/p cardiac stenting, HTN, CKD-III, DJD, remote h/o cervical and lumbar fusions, GERD,  multiple squamous cell cancer  PRECAUTIONS: None  RED  FLAGS: None  WEIGHT BEARING RESTRICTIONS: No  FALLS:  Has patient fallen in last 6 months? No  LIVING ENVIRONMENT: Lives with: lives with their spouse Lives in: House/apartment Stairs: No Has following equipment at home: None  OCCUPATION: Retired  PLOF: Independent and Leisure: [employer] 3x/wk  PATIENT GOALS: To be able to remain active w/o need for lidocaine  patches.   OBJECTIVE: (objective measures completed at initial evaluation unless otherwise dated)  DIAGNOSTIC FINDINGS:  07/20/23 - DG Right Hip Unilat With Pelvis 2-3 Views:  IMPRESSION: 1. No explanation for patient's acute on chronic right hip pain. 2. Suspected degenerative change of the lower lumbar spine, incompletely evaluated. Further evaluation with dedicated lumbar spine radiographs could be performed as indicated.  PATIENT SURVEYS:  LEFS 62 / 80 = 77.5 %  COGNITION: Overall cognitive status: Within functional limits for tasks assessed    SENSATION: WFL  EDEMA:  N/A  POSTURE:  Slight flexed trunk   PALPATION: Denies TTP over B greater trochanter or glutes/piriformis but some increased muscle tension noted in glutes/piriformis and hip flexors, mostly on R  MUSCLE LENGTH: Hamstrings: mild/mod tight L>R ITB: mod tight R>L Piriformis: mod tight R>L Hip flexors: mod tight R>L Quads: mod tight R>L Heelcord: mild tight B  LOWER EXTREMITY ROM: Mildly limited hip ROM, R>L, with greatest limitation in B hip extension ER/IR  LOWER EXTREMITY MMT:  MMT Right eval Left eval  Hip flexion 4 4  Hip extension 4 * 4 *  Hip abduction 3- 3+  Hip adduction 4+ 4+  Hip internal rotation 4 4  Hip external rotation 3+ 3+  Knee flexion 4 5  Knee extension 4 5  Ankle dorsiflexion 4 5  Ankle plantarflexion 5 5  Ankle inversion    Ankle eversion     (Blank rows = not tested, * = limited ROM))  LOWER EXTREMITY SPECIAL TESTS:  Hip special tests: Ober's test: positive   FUNCTIONAL TESTS:  5 times sit to  stand: 11.06 sec  GAIT: Distance walked: Clinic distances Assistive device utilized: None Level of assistance: Complete Independence Gait pattern: WFL   TODAY'S TREATMENT:   09/02/23 THERAPEUTIC EXERCISE: to improve flexibility, strength and mobility.  Demonstration, verbal and tactile cues throughout for technique.  NuStep - L4 x 6 min (UE/LE) Standing lunge with single UE support on counter x 10 on R, x 5 on L (discontinued d/t R knee pain/instability) BATCA leg press B LE 25# 2 x 10 Standing hip ABD isometric into ball against counter 10 x 3, 2 sets each side Standing hip ER isometric into ball against counter 10 x 3 each side Seated hip hinge HS stretch 3 x 30 each LE Standing Fitter hip extension (1 black/1 blue) 2 x 10 each LE, UE support on back of chair for balance Standing Fitter hip ABD (1 black/1 blue) x 10 each side, UE support on back of chair for balance Standing alt hip abduction with looped RTB at ankles x 10 bil - cues to avoid lateral lean Standing alt hip extension with looped RTB at ankles x 15 bil - cues to avoid forward lean over counter   08/27/23 THERAPEUTIC EXERCISE: to improve flexibility, strength and mobility.  Demonstration, verbal and tactile cues throughout for technique.  TM 1.9 mph x 6 min Hooklying HS stretch with strap x 30 Supine cross-body ITB stretch with strap x 30 Mod Thomas quad/hip flexor stretch with strap x 30 Hooklying figure-4 piriformis  stretch with slight overpressure x 30 Hooklying KTOS piriformis stretch x 30 Hooklying LTR 5 x 10-15 Bridge + RTB hip abduction isometric 10 x 5 S/L RTB clam x 10 bil B side-stepping with looped RTB at ankles 3 x 10 ft along counter Fwd monster walk with looped RTB at ankles x 10 ft along counter Standing alt hip extension+abduction with looped RTB at ankles x 15 bil - cues to avoid forward or lateral lean over counter BATCA knee extension B con/ecc 20# x 10, 10# B con/L ecc 10# x 10 BATCA  knee flexion B con/ecc 20# x 10, 15# B con/L ecc 10# x 10   08/13/23 THERAPEUTIC EXERCISE: to improve flexibility, strength and mobility.  Demonstration, verbal and tactile cues throughout for technique.  Lat pulls and rows reviewed for gym Supine LTR x 10 each way Bridges 2 x 10  S/L clamshells RTB x 10 S/L R hip abduction x 15 Prone R hip extension x 10; L x 8     PATIENT EDUCATION:  Education details: HEP update - RTB resisted standing hip abduction/extension Person educated: Patient Education method: Explanation, Demonstration, Verbal cues, and MedBridgeGO app updated Education comprehension: verbalized understanding, returned demonstration, verbal cues required, and needs further education  HOME EXERCISE PROGRAM: *Access Code: [email or web address] URL: [email or web address] Date: 09/02/2023 Prepared by: Elijah Hidden  Exercises - Hooklying Hamstring Stretch with Strap (Mirrored)  - 2 x daily - 7 x weekly - 3 reps - 30 sec hold - Supine Iliotibial Band Stretch with Strap  - 2 x daily - 7 x weekly - 3 reps - 30 sec hold - Hip Flexor Stretch with Strap on Table (Mirrored)  - 2 x daily - 7 x weekly - 3 reps - 30 sec hold - Supine Figure 4 Piriformis Stretch  - 2 x daily - 7 x weekly - 3 reps - 30 sec hold - Supine Piriformis Stretch with Foot on Ground  - 2 x daily - 7 x weekly - 3 reps - 30 sec hold - Supine Lower Trunk Rotation  - 2 x daily - 7 x weekly - 5 reps - 10 sec hold - Clam with Resistance  - 1 x daily - 7 x weekly - 2 sets - 10 reps - 3-5 sec hold - Supine Bridge with Resistance Band  - 1 x daily - 7 x weekly - 2 sets - 10 reps - 5 sec hold - Standing Hip Extension with Resistance at Ankles and Counter Support  - 1 x daily - 3 x weekly - 2 sets - 10 reps - 3 sec hold - Standing Hip Abduction with Resistance at Ankles and Counter Support  - 1 x daily - 3 x weekly - 2 sets - 10 reps - 3 sec hold  *Patient using MedBridgeGO app  ASSESSMENT:  CLINICAL  IMPRESSION: [name] denies any issues with his HEP but notes he is not able to lift as high in the bridge as is demonstrated in the videos - explained that the combination of his hip flexor tightness and hip extensor weakness is likely limiting his ROM.  Continued focus on lateral and posterior hip strengthening with good tolerance, but cues necessary at times for proper posture and movement patterns.  HEP updated to included RTB resisted hip abduction and extension in standing every other day or 3x/wk.  Joe will benefit from continued skilled PT to address above deficits to improve mobility and activity tolerance with decreased pain interference.   OBJECTIVE IMPAIRMENTS:  decreased activity tolerance, decreased knowledge of condition, decreased mobility, difficulty walking, decreased ROM, decreased strength, increased fascial restrictions, impaired perceived functional ability, increased muscle spasms, improper body mechanics, postural dysfunction, and pain.   ACTIVITY LIMITATIONS: bending, squatting, stairs, transfers, and locomotion level  PARTICIPATION LIMITATIONS: community activity  PERSONAL FACTORS: Age, Past/current experiences, Time since onset of injury/illness/exacerbation, and 3+ comorbidities: CAD with MI 2002 s/p cardiac stenting, HTN, CKD-III, DJD, remote h/o cervical and lumbar fusions, GERD, multiple squamous cell cancer  are also affecting patient's functional outcome.   REHAB POTENTIAL: Excellent  CLINICAL DECISION MAKING: Stable/uncomplicated  EVALUATION COMPLEXITY: Low   GOALS: Goals reviewed with patient? Yes  SHORT TERM GOALS: Target date: 09/03/2023  Patient will be independent with initial HEP. Baseline:  Goal status: MET - 08/27/23  LONG TERM GOALS: Target date: 10/01/2023   Patient will be independent with advanced/ongoing HEP to improve outcomes and carryover.  Baseline:  Goal status: IN PROGRESS  2.  Patient will report at least 50-75% improvement in R hip  pain to improve QOL. Baseline: 2/10 Goal status: IN PROGRESS  3.  Patient will demonstrate improved B LE strength to >/= 4+/5 for improved stability and ease of mobility. Baseline: Refer to above LE MMT table Goal status: IN PROGRESS  4.  Patient will be able to ambulate >1 mile on track at gym without increased pain or need for lidocaine  patch to allow for regular exercise.  Baseline: Currently must wear lidocaine  patch in order to complete workout at the [facility] including walking 1 mile on track Goal status: IN PROGRESS  5.  Patient will report >/= 71/80 on LEFS to demonstrate improved functional ability. Baseline: 62 / 80 = 77.5 % Goal status: IN PROGRESS   PLAN:  PT FREQUENCY: 1-2x/week  PT DURATION: 8 weeks  PLANNED INTERVENTIONS: 97164- PT Re-evaluation, 97110-Therapeutic exercises, 97530- Therapeutic activity, 97112- Neuromuscular re-education, 97535- Self Care, 02859- Manual therapy, [id number]- Gait training, 97014- Electrical stimulation (unattended), [phone]- Electrical stimulation (manual), [id number]- Ultrasound, 02966- Ionotophoresis 4mg /ml Dexamethasone, Patient/Family education, Balance training, Stair training, Taping, Dry Needling, Joint mobilization, Cryotherapy, and Moist heat  PLAN FOR NEXT SESSION: progress lumbopelvic and proximal LE flexibility and strengthening with emphasis on hip extension and lateral/rotational motions - review/update HEP as indicated; MT +/- DN to address abnormal muscle tension in R hip musculature   Elijah CHRISTELLA Hidden, PT 09/02/2023, 12:14 PM

## 2023-09-03 ENCOUNTER — Encounter: Payer: Self-pay | Admitting: Cardiology

## 2023-09-03 ENCOUNTER — Ambulatory Visit: Payer: PPO | Attending: Cardiology | Admitting: Cardiology

## 2023-09-03 VITALS — BP 114/78 | HR 45 | Ht 65.0 in | Wt 167.0 lb

## 2023-09-03 DIAGNOSIS — I10 Essential (primary) hypertension: Secondary | ICD-10-CM | POA: Diagnosis not present

## 2023-09-03 DIAGNOSIS — Z9861 Coronary angioplasty status: Secondary | ICD-10-CM | POA: Diagnosis not present

## 2023-09-03 DIAGNOSIS — I351 Nonrheumatic aortic (valve) insufficiency: Secondary | ICD-10-CM

## 2023-09-03 DIAGNOSIS — I2510 Atherosclerotic heart disease of native coronary artery without angina pectoris: Secondary | ICD-10-CM | POA: Diagnosis not present

## 2023-09-03 DIAGNOSIS — E785 Hyperlipidemia, unspecified: Secondary | ICD-10-CM | POA: Diagnosis not present

## 2023-09-03 NOTE — Patient Instructions (Signed)
 Medication Instructions:  Stop taking plavix  (clopedogrel).  *If you need a refill on your cardiac medications before your next appointment, please call your pharmacy*   Testing/Procedures: Your physician has requested that you have an echocardiogram. Echocardiography is a painless test that uses sound waves to create images of your heart. It provides your doctor with information about the size and shape of your heart and how well your heart's chambers and valves are working. This procedure takes approximately one hour. There are no restrictions for this procedure.  [address]  Please do NOT wear cologne, perfume, aftershave, or lotions (deodorant is allowed). Please arrive 15 minutes prior to your appointment time.  Please note: We ask at that you not bring children with you during ultrasound (echo/ vascular) testing. Due to room size and safety concerns, children are not allowed in the ultrasound rooms during exams. Our front office staff cannot provide observation of children in our lobby area while testing is being conducted. An adult accompanying a patient to their appointment will only be allowed in the ultrasound room at the discretion of the ultrasound technician under special circumstances. We apologize for any inconvenience.    Follow-Up: At [facility], you and your health needs are our priority.  As part of our continuing mission to provide you with exceptional heart care, we have created designated Provider Care Teams.  These Care Teams include your primary Cardiologist (physician) and Advanced Practice Providers (APPs -  Physician Assistants and Nurse Practitioners) who all work together to provide you with the care you need, when you need it.  We recommend signing up for the patient portal called MyChart.  Sign up information is provided on this After Visit Summary.  MyChart is used to connect with patients for Virtual Visits (Telemedicine).  Patients are able to  view lab/test results, encounter notes, upcoming appointments, etc.  Non-urgent messages can be sent to your provider as well.   To learn more about what you can do with MyChart, go to [email or web address].    Your next appointment:   1 year(s)  Provider:   Lynwood Schilling, MD

## 2023-09-07 ENCOUNTER — Other Ambulatory Visit: Payer: Self-pay | Admitting: Family Medicine

## 2023-09-07 DIAGNOSIS — G252 Other specified forms of tremor: Secondary | ICD-10-CM

## 2023-09-07 DIAGNOSIS — K219 Gastro-esophageal reflux disease without esophagitis: Secondary | ICD-10-CM

## 2023-09-07 DIAGNOSIS — K2270 Barrett's esophagus without dysplasia: Secondary | ICD-10-CM

## 2023-09-09 NOTE — Telephone Encounter (Signed)
 Copied from CRM [id number]. Topic: Clinical - Prescription Issue >> Sep 09, 2023  8:07 AM Adonis Hoot wrote: Reason for CRM: patient called in to inquire about the denial of medications  primidone  (MYSOLINE ) 50 MG tablet and pantoprazole  (PROTONIX ) 40 MG tablet form provider

## 2023-09-10 ENCOUNTER — Telehealth: Payer: Self-pay | Admitting: Family Medicine

## 2023-09-10 ENCOUNTER — Ambulatory Visit: Payer: PPO | Admitting: Physical Therapy

## 2023-09-10 ENCOUNTER — Encounter: Payer: Self-pay | Admitting: Physical Therapy

## 2023-09-10 DIAGNOSIS — G252 Other specified forms of tremor: Secondary | ICD-10-CM

## 2023-09-10 DIAGNOSIS — M25651 Stiffness of right hip, not elsewhere classified: Secondary | ICD-10-CM

## 2023-09-10 DIAGNOSIS — M25551 Pain in right hip: Secondary | ICD-10-CM | POA: Diagnosis not present

## 2023-09-10 DIAGNOSIS — K2270 Barrett's esophagus without dysplasia: Secondary | ICD-10-CM

## 2023-09-10 DIAGNOSIS — M62838 Other muscle spasm: Secondary | ICD-10-CM

## 2023-09-10 DIAGNOSIS — M6281 Muscle weakness (generalized): Secondary | ICD-10-CM

## 2023-09-10 DIAGNOSIS — K219 Gastro-esophageal reflux disease without esophagitis: Secondary | ICD-10-CM

## 2023-09-10 DIAGNOSIS — R2689 Other abnormalities of gait and mobility: Secondary | ICD-10-CM

## 2023-09-10 MED ORDER — PANTOPRAZOLE SODIUM 40 MG PO TBEC: 40.0000 mg | DELAYED_RELEASE_TABLET | Freq: Every morning | ORAL | 3 refills | Status: DC

## 2023-09-10 MED ORDER — PRIMIDONE 50 MG PO TABS: ORAL_TABLET | ORAL | 2 refills | Status: DC

## 2023-09-10 NOTE — Therapy (Signed)
OUTPATIENT PHYSICAL THERAPY TREATEMNT   Patient Name: [name] MRN: [id number] DOB:1941-08-01, 83 y.o., male Today's Date: 09/10/2023   END OF SESSION:  PT End of Session - 09/10/23 0924     Visit Number 5    Date for PT Re-Evaluation 10/01/23    Authorization Type [employer]    Progress Note Due on Visit 10    PT Start Time 0928    PT Stop Time 1013    PT Time Calculation (min) 45 min    Activity Tolerance Patient tolerated treatment well    Behavior During Therapy [facility] for tasks assessed/performed                 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 - decreased dose of HCTZ to M,F   Diverticulosis    DJD (degenerative joint disease)    Duodenal ulcer    ED (erectile dysfunction)    GERD (gastroesophageal reflux disease)    increased s/s in the last week 03-22-19 PV    Hemorrhoids    Hx of adenomatous colonic polyps    Hyperlipidemia    Hypertension    Myocardial infarction (HCC)    2002   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   mid frontal scalp-[id number]   SCC (squamous cell carcinoma) 06/15/2019   front middle scalp-tx cx5fu   SCC (squamous cell carcinoma) 12/27/2002   CIS-right angle of jaw--cx56fu   SCC (squamous cell carcinoma) 09/24/2004   CIS--mid post crown, left mis crown medial,left first web space-tx cx58fu   SCC (squamous cell carcinoma) 06/03/2006   SCCA-right temple-tx cx28fu   SCC (squamous cell carcinoma) 06/03/2006   CIS-right forehead, left mis crown scalp medial-tx cx83fu   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/30/2006   right temple lateral-txpbx   SCC (squamous cell carcinoma) 11/10/2006   left forehead, left hand  proximal   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/02/2009   left scalp-cx74fu   SCC (squamous cell carcinoma) 01/24/2015   left scalp-cx29fu   SCC (squamous cell carcinoma) 11/12/2015   front lateralscalp-cx29fu, medial scalp-cx40fu   SCC (squamous cell carcinoma) 12/17/2016   front center scalp-cx49fu   Squamous cell carcinoma of skin 08/10/2017   CIS--right ear rim, top of scalp, left side scalp-tx-cx73fu   Past Surgical History:  Procedure Laterality Date   CARDIAC CATHETERIZATION     COLONOSCOPY     CORONARY STENT INTERVENTION N/A 09/03/2022   Procedure: CORONARY STENT INTERVENTION;  Surgeon: Marykay Lex, MD;  Location: [facility] INVASIVE CV LAB;  Service: Cardiovascular;  Laterality: N/A;   ELBOW ARTHROSCOPY     heart stents     2002,2004   LEFT HEART CATH AND CORONARY ANGIOGRAPHY N/A 09/03/2022   Procedure: LEFT HEART CATH AND CORONARY ANGIOGRAPHY;  Surgeon: Marykay Lex, MD;  Location: [facility] INVASIVE CV LAB;  Service: Cardiovascular;  Laterality: N/A;   SPINAL FUSION     SPINAL FUSION     SPINE SURGERY     cervical   UPPER GASTROINTESTINAL ENDOSCOPY     Patient Active Problem List   Diagnosis Date Noted   Elevated glucose 07/16/2023  Trochanteric bursitis of right hip 07/16/2023   Benign essential tremor 04/14/2023   Microhematuria 04/14/2023   Diarrhea of presumed infectious origin 01/05/2023   Iron deficiency anemia secondary to inadequate dietary iron intake 09/23/2022   Presence of drug coated stent in left circumflex coronary artery 09/04/2022   Presence of bare metal stent in right coronary artery 09/04/2022   Presence of drug coated stent in LAD coronary artery 09/04/2022   Coronary artery disease involving native heart with unstable angina pectoris (HCC) 09/03/2022   Screening for diabetes mellitus 03/18/2022   Primary osteoarthritis involving multiple joints 03/18/2022   Macrocytic anemia 09/22/2016   Coarse tremors 06/10/2016   CAD (coronary artery disease)  05/01/2015   CKD (chronic kidney disease), stage III (HCC) 05/01/2015   BPH with obstruction/lower urinary tract symptoms 11/13/2010   BARRETTS ESOPHAGUS 01/24/2010   Elevated cholesterol 09/21/2007   Chronic ischemic heart disease 09/21/2007   ERECTILE DYSFUNCTION 03/09/2007   Essential hypertension 03/09/2007    PCP: Mliss Sax, MD   REFERRING PROVIDER: Burna Forts, MD   REFERRING DIAG: [phone] (ICD-10-CM) - Lateral pain of right hip   THERAPY DIAG:  Pain in right hip  Muscle weakness (generalized)  Other abnormalities of gait and mobility  Stiffness of right hip, not elsewhere classified  Other muscle spasm  RATIONALE FOR EVALUATION AND TREATMENT: Rehabilitation  ONSET DATE: Chronic with exacerbation over past 6 months  NEXT MD VISIT: None scheduled   SUBJECTIVE:                                                                                                                                                                                                         SUBJECTIVE STATEMENT: Pt reports his L knee started clicking in the back of the knee when he got up this morning - not sure what triggered this and no pain associated with the click.  He notes he had a "good workout" yesterday morning rearranging his storage area to be able to put his Christmas decorations away in the front for easier access.  Still having intermittent posterior thigh and R hip pain but not as bad and none today.  EVAL:  Pt reports almost constant pain in R hip.  Goes to [facility] to walk ~1-1.2 mile 3x/wk followed by weight machines - typically 1 hour 3x/wk.  He places a lidocaine patch on his hip which typically allows him to complete the walk and exercise.  Also wears a sleeve brace on his R knee due to chronic knee issues.  Occasional radiation along R  ITB.  PAIN: Are you having pain? No and Yes: NPRS scale:  0/10 Pain location: B proximal posterior thighs & R hip Pain description: now  more dull and barely there when present Aggravating factors: walking w/o lidocaine patch Relieving factors: lidocaine patches, Voltaren gel  PERTINENT HISTORY:  CAD with MI 2002 s/p cardiac stenting, HTN, CKD-III, DJD, remote h/o cervical and lumbar fusions, GERD, multiple squamous cell cancer  PRECAUTIONS: None  RED FLAGS: None  WEIGHT BEARING RESTRICTIONS: No  FALLS:  Has patient fallen in last 6 months? No  LIVING ENVIRONMENT: Lives with: lives with their spouse Lives in: House/apartment Stairs: No Has following equipment at home: None  OCCUPATION: Retired  PLOF: Independent and Leisure: [employer] 3x/wk  PATIENT GOALS: "To be able to remain active w/o need for lidocaine patches."   OBJECTIVE: (objective measures completed at initial evaluation unless otherwise dated)  DIAGNOSTIC FINDINGS:  07/20/23 - DG Right Hip Unilat With Pelvis 2-3 Views:  IMPRESSION: 1. No explanation for patient's acute on chronic right hip pain. 2. Suspected degenerative change of the lower lumbar spine, incompletely evaluated. Further evaluation with dedicated lumbar spine radiographs could be performed as indicated.  PATIENT SURVEYS:  LEFS 62 / 80 = 77.5 %  COGNITION: Overall cognitive status: Within functional limits for tasks assessed    SENSATION: WFL  EDEMA:  N/A  POSTURE:  Slight flexed trunk   PALPATION: Denies TTP over B greater trochanter or glutes/piriformis but some increased muscle tension noted in glutes/piriformis and hip flexors, mostly on R  MUSCLE LENGTH: Hamstrings: mild/mod tight L>R ITB: mod tight R>L Piriformis: mod tight R>L Hip flexors: mod tight R>L Quads: mod tight R>L Heelcord: mild tight B  LOWER EXTREMITY ROM: Mildly limited hip ROM, R>L, with greatest limitation in B hip extension ER/IR  LOWER EXTREMITY MMT:  MMT Right eval Left eval R 09/10/23 L 09/10/23  Hip flexion 4 4 5 5   Hip extension 4 * 4 * 4+ * 4 *  Hip abduction 3- 3+ 4- 4-  Hip  adduction 4+ 4+ 4+ 4+  Hip internal rotation 4 4 4+ 4+  Hip external rotation 3+ 3+ 4 4+  Knee flexion 4 5 4+ 5  Knee extension 4 5 4+ 5  Ankle dorsiflexion 4 5 4+ 5  Ankle plantarflexion 5 5    Ankle inversion      Ankle eversion       (Blank rows = not tested, * = limited ROM))  LOWER EXTREMITY SPECIAL TESTS:  Hip special tests: Ober's test: positive   FUNCTIONAL TESTS:  5 times sit to stand: 11.06 sec  GAIT: Distance walked: Clinic distances Assistive device utilized: None Level of assistance: Complete Independence Gait pattern: WFL   TODAY'S TREATMENT:   09/10/23 THERAPEUTIC EXERCISE: to improve flexibility, strength and mobility.  Demonstration, verbal and tactile cues throughout for technique.  TM 2.0-2.2 mph x 7 min Standing hip flexion + ER x 10 bil Standing alt hip abduction with looped GTB at ankles x 10 bil - cues to avoid lateral lean and/or hip ER Standing alt hip extension with looped GTB at ankles x 10 bil - cues to avoid forward lean  S/L GTB clam x 10 bil  NEUROMUSCULAR RE-EDUCATION: To improve balance, proprioception, strength, and reduce fall risk. SLS + opp LE GTB 4-way SLR x 10 each bil, UE support on back of chair for balance - cues to avoid hip ER with ABD SLR  THERAPEUTIC ACTIVITIES: LE MMT   09/02/23 THERAPEUTIC EXERCISE:  to improve flexibility, strength and mobility.  Demonstration, verbal and tactile cues throughout for technique.  NuStep - L4 x 6 min (UE/LE) Standing lunge with single UE support on counter x 10 on R, x 5 on L (discontinued d/t R knee pain/instability) BATCA leg press B LE 25# 2 x 10 Standing hip ABD isometric into ball against counter 10 x 3", 2 sets each side Standing hip ER isometric into ball against counter 10 x 3" each side Seated hip hinge HS stretch 3 x 30" each LE Standing Fitter hip extension (1 black/1 blue) 2 x 10 each LE, UE support on back of chair for balance Standing Fitter hip ABD (1 black/1 blue) x 10 each  side, UE support on back of chair for balance Standing alt hip abduction with looped RTB at ankles x 10 bil - cues to avoid lateral lean Standing alt hip extension with looped RTB at ankles x 15 bil - cues to avoid forward lean over counter   08/27/23 THERAPEUTIC EXERCISE: to improve flexibility, strength and mobility.  Demonstration, verbal and tactile cues throughout for technique.  TM 1.9 mph x 6 min Hooklying HS stretch with strap x 30" Supine cross-body ITB stretch with strap x 30" Mod Thomas quad/hip flexor stretch with strap x 30" Hooklying figure-4 piriformis stretch with slight overpressure x 30" Hooklying KTOS piriformis stretch x 30" Hooklying LTR 5 x 10-15" Bridge + RTB hip abduction isometric 10 x 5" S/L RTB clam x 10 bil B side-stepping with looped RTB at ankles 3 x 10 ft along counter Fwd monster walk with looped RTB at ankles x 10 ft along counter Standing alt hip extension+abduction with looped RTB at ankles x 15 bil - cues to avoid forward or lateral lean over counter BATCA knee extension B con/ecc 20# x 10, 10# B con/L ecc 10# x 10 BATCA knee flexion B con/ecc 20# x 10, 15# B con/L ecc 10# x 10   08/13/23 THERAPEUTIC EXERCISE: to improve flexibility, strength and mobility.  Demonstration, verbal and tactile cues throughout for technique.  Lat pulls and rows reviewed for gym Supine LTR x 10 each way Bridges 2 x 10  S/L clamshells RTB x 10 S/L R hip abduction x 15 Prone R hip extension x 10; L x 8     PATIENT EDUCATION:  Education details: HEP update - RTB resisted standing hip abduction/extension Person educated: Patient Education method: Explanation, Demonstration, Verbal cues, and MedBridgeGO app updated Education comprehension: verbalized understanding, returned demonstration, verbal cues required, and needs further education  HOME EXERCISE PROGRAM: *Access Code: [email or web address] URL: [email or web address] Date: 09/02/2023 Prepared by: Glenetta Hew  Exercises - Hooklying Hamstring Stretch with Strap (Mirrored)  - 2 x daily - 7 x weekly - 3 reps - 30 sec hold - Supine Iliotibial Band Stretch with Strap  - 2 x daily - 7 x weekly - 3 reps - 30 sec hold - Hip Flexor Stretch with Strap on Table (Mirrored)  - 2 x daily - 7 x weekly - 3 reps - 30 sec hold - Supine Figure 4 Piriformis Stretch  - 2 x daily - 7 x weekly - 3 reps - 30 sec hold - Supine Piriformis Stretch with Foot on Ground  - 2 x daily - 7 x weekly - 3 reps - 30 sec hold - Supine Lower Trunk Rotation  - 2 x daily - 7 x weekly - 5 reps - 10 sec hold - Clam with Resistance  - 1 x daily -  7 x weekly - 2 sets - 10 reps - 3-5 sec hold - Supine Bridge with Resistance Band  - 1 x daily - 7 x weekly - 2 sets - 10 reps - 5 sec hold - Standing Hip Extension with Resistance at Ankles and Counter Support  - 1 x daily - 3 x weekly - 2 sets - 10 reps - 3 sec hold - Standing Hip Abduction with Resistance at Ankles and Counter Support  - 1 x daily - 3 x weekly - 2 sets - 10 reps - 3 sec hold  *Patient using MedBridgeGO app  ASSESSMENT:  CLINICAL IMPRESSION: Travoris "Joe" reports 50% reduction in his pain since start of PT allowing him to walk the track at the gym ~0.9 miles w/o need for the lidocaine patch.  MMT revealing good gains t/o B LE with greatest ongoing weakness in hip ABD and ER.  Progressed strengthening to target ongoing weakness while also promoting improved SLS balance and proprioception.  Pt reporting increased effort necessary but able to complete all exercises with intermittent rest breaks.  Able to progress TB resistance to GTB for S/L clams for HEP but looped GTB too intense for standing hip extension and abduction so patient to continue with RTB for now.  Joe will benefit from continued skilled PT to address above deficits to improve mobility and activity tolerance with decreased pain interference.   OBJECTIVE IMPAIRMENTS: decreased activity tolerance, decreased knowledge  of condition, decreased mobility, difficulty walking, decreased ROM, decreased strength, increased fascial restrictions, impaired perceived functional ability, increased muscle spasms, improper body mechanics, postural dysfunction, and pain.   ACTIVITY LIMITATIONS: bending, squatting, stairs, transfers, and locomotion level  PARTICIPATION LIMITATIONS: community activity  PERSONAL FACTORS: Age, Past/current experiences, Time since onset of injury/illness/exacerbation, and 3+ comorbidities: CAD with MI 2002 s/p cardiac stenting, HTN, CKD-III, DJD, remote h/o cervical and lumbar fusions, GERD, multiple squamous cell cancer  are also affecting patient's functional outcome.   REHAB POTENTIAL: Excellent  CLINICAL DECISION MAKING: Stable/uncomplicated  EVALUATION COMPLEXITY: Low   GOALS: Goals reviewed with patient? Yes  SHORT TERM GOALS: Target date: 09/03/2023  Patient will be independent with initial HEP. Baseline:  Goal status: MET - 08/27/23  LONG TERM GOALS: Target date: 10/01/2023   Patient will be independent with advanced/ongoing HEP to improve outcomes and carryover.  Baseline:  Goal status: IN PROGRESS  2.  Patient will report at least 50-75% improvement in R hip pain to improve QOL. Baseline: 2/10 Goal status: PARTIALLY MET - 09/10/23 - Pt reports 50% improvement in pain  3.  Patient will demonstrate improved B LE strength to >/= 4+/5 for improved stability and ease of mobility. Baseline: Refer to above LE MMT table Goal status: PARTIALLY MET - 09/10/23 - overall B LE improving  4.  Patient will be able to ambulate >1 mile on track at gym without increased pain or need for lidocaine patch to allow for regular exercise.  Baseline: Currently must wear lidocaine patch in order to complete workout at the [facility] including walking 1 mile on track Goal status: IN PROGRESS - 09/10/23 - Patient reports he is walking ~.9 miles w/o lidocaine patches  5.  Patient will report >/= 71/80 on LEFS  to demonstrate improved functional ability. Baseline: 62 / 80 = 77.5 % Goal status: IN PROGRESS   PLAN:  PT FREQUENCY: 1-2x/week  PT DURATION: 8 weeks  PLANNED INTERVENTIONS: 97164- PT Re-evaluation, 97110-Therapeutic exercises, 97530- Therapeutic activity, [id number]- Neuromuscular re-education, 97535- Self Care, [id number]- Manual  therapy, [id number]- Gait training, 86578- Electrical stimulation (unattended), [id number]- Electrical stimulation (manual), [id number]- Ultrasound, [id number]- Ionotophoresis 4mg /ml Dexamethasone, Patient/Family education, Balance training, Stair training, Taping, Dry Needling, Joint mobilization, Cryotherapy, and Moist heat  PLAN FOR NEXT SESSION: progress lumbopelvic and proximal LE flexibility and strengthening with emphasis on hip extension and lateral/rotational motions - review/update HEP as indicated; MT +/- DN to address abnormal muscle tension in R hip musculature   Marry Guan, PT 09/10/2023, 10:20 AM

## 2023-09-10 NOTE — Telephone Encounter (Signed)
Copied from CRM [id number]. Topic: Clinical - Medication Question >> Sep 10, 2023 11:16 AM Gurney Maxin H wrote: Reason for CRM: Patient called in to inquire about the status of medication refill for his  primidone (MYSOLINE) 50 MG tablet and pantoprazole (PROTONIX) 40 MG tablet form provider. Patient states he was told by pharmacy that doctor declined refills

## 2023-09-10 NOTE — Addendum Note (Signed)
Addended by: Nadene Rubins A on: 09/10/2023 12:15 PM   Modules accepted: Orders

## 2023-09-17 ENCOUNTER — Ambulatory Visit: Payer: PPO | Admitting: Physical Therapy

## 2023-09-17 ENCOUNTER — Encounter: Payer: Self-pay | Admitting: Physical Therapy

## 2023-09-17 DIAGNOSIS — M25551 Pain in right hip: Secondary | ICD-10-CM

## 2023-09-17 DIAGNOSIS — M6281 Muscle weakness (generalized): Secondary | ICD-10-CM

## 2023-09-17 DIAGNOSIS — M25651 Stiffness of right hip, not elsewhere classified: Secondary | ICD-10-CM

## 2023-09-17 DIAGNOSIS — M62838 Other muscle spasm: Secondary | ICD-10-CM

## 2023-09-17 DIAGNOSIS — R2689 Other abnormalities of gait and mobility: Secondary | ICD-10-CM

## 2023-09-17 NOTE — Therapy (Addendum)
 OUTPATIENT PHYSICAL THERAPY TREATMENT / DISCHARGE SUMMARY  Progress Note  Reporting Period 08/06/2023 to 09/17/2023   See note below for Objective Data and Assessment of Progress/Goals.     Patient Name: [name] MRN: [id number] DOB:08/17/1941, 83 y.o., male Today's Date: 09/17/2023   END OF SESSION:  PT End of Session - 09/17/23 0931     Visit Number 6    Date for PT Re-Evaluation 10/01/23    Authorization Type [employer]    Progress Note Due on Visit 10    PT Start Time 0931    PT Stop Time 1004    PT Time Calculation (min) 33 min    Activity Tolerance Patient tolerated treatment well    Behavior During Therapy [facility] for tasks assessed/performed                  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 - decreased dose of HCTZ to M,F   Diverticulosis    DJD (degenerative joint disease)    Duodenal ulcer    ED (erectile dysfunction)    GERD (gastroesophageal reflux disease)    increased s/s in the last week 03-22-19 PV    Hemorrhoids    Hx of adenomatous colonic polyps    Hyperlipidemia    Hypertension    Myocardial infarction (HCC)    2002   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   mid frontal scalp-[id number]   SCC (squamous cell carcinoma) 06/15/2019   front middle scalp-tx [id number]   SCC (squamous cell carcinoma) 12/27/2002   CIS-right angle of jaw--cx69fu   SCC (squamous cell carcinoma) 09/24/2004   CIS--mid post crown, left mis crown medial,left first web space-tx cx61fu   SCC (squamous cell carcinoma) 06/03/2006   SCCA-right temple-tx cx79fu   SCC (squamous cell carcinoma) 06/03/2006   CIS-right forehead, left mis crown scalp medial-tx  cx61fu   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/30/2006   right temple lateral-txpbx   SCC (squamous cell carcinoma) 11/10/2006   left forehead, left hand proximal   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/02/2009   left scalp-cx9fu   SCC (squamous cell carcinoma) 01/24/2015   left scalp-cx42fu   SCC (squamous cell carcinoma) 11/12/2015   front lateralscalp-cx12fu, medial scalp-cx53fu   SCC (squamous cell carcinoma) 12/17/2016   front center scalp-cx35fu   Squamous cell carcinoma of skin 08/10/2017   CIS--right ear rim, top of scalp, left side scalp-tx-cx22fu   Past Surgical History:  Procedure Laterality Date   CARDIAC CATHETERIZATION     COLONOSCOPY     CORONARY STENT INTERVENTION N/A 09/03/2022   Procedure: CORONARY STENT INTERVENTION;  Surgeon: Arleen Lacer, MD;  Location: [facility] INVASIVE CV LAB;  Service: Cardiovascular;  Laterality: N/A;   ELBOW ARTHROSCOPY     heart stents     2002,2004   LEFT HEART CATH AND CORONARY ANGIOGRAPHY N/A 09/03/2022   Procedure: LEFT HEART CATH AND CORONARY ANGIOGRAPHY;  Surgeon: Arleen Lacer, MD;  Location: [facility] INVASIVE CV LAB;  Service: Cardiovascular;  Laterality: N/A;   SPINAL FUSION     SPINAL FUSION     SPINE SURGERY  cervical   UPPER GASTROINTESTINAL ENDOSCOPY     Patient Active Problem List   Diagnosis Date Noted   Elevated glucose 07/16/2023   Trochanteric bursitis of right hip 07/16/2023   Benign essential tremor 04/14/2023   Microhematuria 04/14/2023   Diarrhea of presumed infectious origin 01/05/2023   Iron  deficiency anemia secondary to inadequate dietary iron  intake 09/23/2022   Presence of drug coated stent in left circumflex coronary artery 09/04/2022   Presence of bare metal stent in right coronary artery 09/04/2022   Presence of drug coated stent in LAD coronary artery 09/04/2022   Coronary artery disease involving native heart with unstable angina pectoris (HCC) 09/03/2022   Screening for diabetes mellitus 03/18/2022    Primary osteoarthritis involving multiple joints 03/18/2022   Macrocytic anemia 09/22/2016   Coarse tremors 06/10/2016   CAD (coronary artery disease) 05/01/2015   CKD (chronic kidney disease), stage III (HCC) 05/01/2015   BPH with obstruction/lower urinary tract symptoms 11/13/2010   BARRETTS ESOPHAGUS 01/24/2010   Elevated cholesterol 09/21/2007   Chronic ischemic heart disease 09/21/2007   ERECTILE DYSFUNCTION 03/09/2007   Essential hypertension 03/09/2007    PCP: Tonna Frederic, MD   REFERRING PROVIDER: Orest Bio, MD   REFERRING DIAG: [phone] (ICD-10-CM) - Lateral pain of right hip   THERAPY DIAG:  Pain in right hip  Muscle weakness (generalized)  Other abnormalities of gait and mobility  Stiffness of right hip, not elsewhere classified  Other muscle spasm  RATIONALE FOR EVALUATION AND TREATMENT: Rehabilitation  ONSET DATE: Chronic with exacerbation over past 6 months  NEXT MD VISIT: None scheduled   SUBJECTIVE:                                                                                                                                                                                                         SUBJECTIVE STATEMENT: Pt reports pain has been well controlled lately with no pain currently.   EVAL:  Pt reports almost constant pain in R hip.  Goes to [facility] to walk ~1-1.2 mile 3x/wk followed by weight machines - typically 1 hour 3x/wk.  He places a lidocaine  patch on his hip which typically allows him to complete the walk and exercise.  Also wears a sleeve brace on his R knee due to chronic knee issues.  Occasional radiation along R ITB.  PAIN: Are you having pain? No and Yes: NPRS scale:  0/10 Pain location: B proximal posterior thighs & R hip Pain description: now more dull and barely there when present Aggravating factors: walking w/o lidocaine  patch  Relieving factors: lidocaine  patches, Voltaren gel  PERTINENT HISTORY:  CAD with MI 2002  s/p cardiac stenting, HTN, CKD-III, DJD, remote h/o cervical and lumbar fusions, GERD, multiple squamous cell cancer  PRECAUTIONS: None  RED FLAGS: None  WEIGHT BEARING RESTRICTIONS: No  FALLS:  Has patient fallen in last 6 months? No  LIVING ENVIRONMENT: Lives with: lives with their spouse Lives in: House/apartment Stairs: No Has following equipment at home: None  OCCUPATION: Retired  PLOF: Independent and Leisure: [employer] 3x/wk  PATIENT GOALS: "To be able to remain active w/o need for lidocaine  patches."   OBJECTIVE: (objective measures completed at initial evaluation unless otherwise dated)  DIAGNOSTIC FINDINGS:  07/20/23 - DG Right Hip Unilat With Pelvis 2-3 Views:  IMPRESSION: 1. No explanation for patient's acute on chronic right hip pain. 2. Suspected degenerative change of the lower lumbar spine, incompletely evaluated. Further evaluation with dedicated lumbar spine radiographs could be performed as indicated.  PATIENT SURVEYS:  LEFS 62 / 80 = 77.5 %  COGNITION: Overall cognitive status: Within functional limits for tasks assessed    SENSATION: WFL  EDEMA:  N/A  POSTURE:  Slight flexed trunk   PALPATION: Denies TTP over B greater trochanter or glutes/piriformis but some increased muscle tension noted in glutes/piriformis and hip flexors, mostly on R  MUSCLE LENGTH: Hamstrings: mild/mod tight L>R ITB: mod tight R>L Piriformis: mod tight R>L Hip flexors: mod tight R>L Quads: mod tight R>L Heelcord: mild tight B  LOWER EXTREMITY ROM: Mildly limited hip ROM, R>L, with greatest limitation in B hip extension ER/IR  LOWER EXTREMITY MMT:  MMT Right eval Left eval R 09/10/23 L 09/10/23 R 09/17/23 L 09/17/23  Hip flexion 4 4 5 5 5 5   Hip extension 4 * 4 * 4+ * 4 * 4+ * 4+ *  Hip abduction 3- 3+ 4- 4- 4 4  Hip adduction 4+ 4+ 4+ 4+ 4+ 4+  Hip internal rotation 4 4 4+ 4+ 5 5  Hip external rotation 3+ 3+ 4 4+ 4 5  Knee flexion 4 5 4+ 5 5 5   Knee extension 4  5 4+ 5 5 5   Ankle dorsiflexion 4 5 4+ 5 5 5   Ankle plantarflexion 5 5      Ankle inversion        Ankle eversion         (Blank rows = not tested, * = limited ROM))  LOWER EXTREMITY SPECIAL TESTS:  Hip special tests: Ober's test: positive   FUNCTIONAL TESTS:  5 times sit to stand: 11.06 sec  GAIT: Distance walked: Clinic distances Assistive device utilized: None Level of assistance: Complete Independence Gait pattern: WFL   TODAY'S TREATMENT:   09/17/23 THERAPEUTIC EXERCISE: to improve flexibility, strength and mobility.  Demonstration, verbal and tactile cues throughout for technique.  Rec Bike - L4 x 7 min  Verbal review of HEP - pt declines need for further practice Verbal review of gym rountine  THERAPEUTIC ACTIVITIES:  LEFS: 65 / 80 = 81.3 % (limitation more related to knees than hip) LE MMT Goal assessment   09/10/23 THERAPEUTIC EXERCISE: to improve flexibility, strength and mobility.  Demonstration, verbal and tactile cues throughout for technique.  TM 2.0-2.2 mph x 7 min Standing hip flexion + ER x 10 bil Standing alt hip abduction with looped GTB at ankles x 10 bil - cues to avoid lateral lean and/or hip ER Standing alt hip extension with looped GTB at ankles x 10 bil - cues to avoid forward  lean  S/L GTB clam x 10 bil  NEUROMUSCULAR RE-EDUCATION: To improve balance, proprioception, strength, and reduce fall risk. SLS + opp LE GTB 4-way SLR x 10 each bil, UE support on back of chair for balance - cues to avoid hip ER with ABD SLR  THERAPEUTIC ACTIVITIES: LE MMT   09/02/23 THERAPEUTIC EXERCISE: to improve flexibility, strength and mobility.  Demonstration, verbal and tactile cues throughout for technique.  NuStep - L4 x 6 min (UE/LE) Standing lunge with single UE support on counter x 10 on R, x 5 on L (discontinued d/t R knee pain/instability) BATCA leg press B LE 25# 2 x 10 Standing hip ABD isometric into ball against counter 10 x 3", 2 sets each  side Standing hip ER isometric into ball against counter 10 x 3" each side Seated hip hinge HS stretch 3 x 30" each LE Standing Fitter hip extension (1 black/1 blue) 2 x 10 each LE, UE support on back of chair for balance Standing Fitter hip ABD (1 black/1 blue) x 10 each side, UE support on back of chair for balance Standing alt hip abduction with looped RTB at ankles x 10 bil - cues to avoid lateral lean Standing alt hip extension with looped RTB at ankles x 15 bil - cues to avoid forward lean over counter   PATIENT EDUCATION:  Education details: HEP review and recommended frequency for ongoing HEP at discharge to prevent loss of gains achieved with PT Person educated: Patient Education method: Explanation Education comprehension: verbalized understanding  HOME EXERCISE PROGRAM: *Access Code: [email or web address] URL: [email or web address] Date: 09/02/2023 Prepared by: Felecia Hopper  Exercises - Hooklying Hamstring Stretch with Strap (Mirrored)  - 2 x daily - 7 x weekly - 3 reps - 30 sec hold - Supine Iliotibial Band Stretch with Strap  - 2 x daily - 7 x weekly - 3 reps - 30 sec hold - Hip Flexor Stretch with Strap on Table (Mirrored)  - 2 x daily - 7 x weekly - 3 reps - 30 sec hold - Supine Figure 4 Piriformis Stretch  - 2 x daily - 7 x weekly - 3 reps - 30 sec hold - Supine Piriformis Stretch with Foot on Ground  - 2 x daily - 7 x weekly - 3 reps - 30 sec hold - Supine Lower Trunk Rotation  - 2 x daily - 7 x weekly - 5 reps - 10 sec hold - Clam with Resistance  - 1 x daily - 7 x weekly - 2 sets - 10 reps - 3-5 sec hold - Supine Bridge with Resistance Band  - 1 x daily - 7 x weekly - 2 sets - 10 reps - 5 sec hold - Standing Hip Extension with Resistance at Ankles and Counter Support  - 1 x daily - 3 x weekly - 2 sets - 10 reps - 3 sec hold - Standing Hip Abduction with Resistance at Ankles and Counter Support  - 1 x daily - 3 x weekly - 2 sets - 10 reps - 3 sec hold  *Patient  using MedBridgeGO app   ASSESSMENT:  CLINICAL IMPRESSION: Steven "[name]" reports 90-95% reduction in his pain with no R hip pain recently.  He is able to  walk the track at the gym ~1.0 miles w/o need for the lidocaine  patch - he states he cannot recall the last time he needed to use a lidocaine  patch - and work through all of the cardio and resistance/weight machines w/o limitation  due to R hip pain.  Continued strength gains noted since progression of theraband resistance last visit, although mild weakness still remaining in B hip ABD and R hip ER (considerably better than baseline).  Improvement noted on LEFS although not to full goal level, however pt reporting majority of remaining limitations due to knee OA rather than the hip pain that brought him to PT.  [name] is very pleased with his progress.  All PT goals now met or partially met and [name] feels ready to transition to his HEP but would like to remain on hold for 30-days in the event that issues arise that would necessitate a return to PT.  OBJECTIVE IMPAIRMENTS: decreased activity tolerance, decreased knowledge of condition, decreased mobility, difficulty walking, decreased ROM, decreased strength, increased fascial restrictions, impaired perceived functional ability, increased muscle spasms, improper body mechanics, postural dysfunction, and pain.   ACTIVITY LIMITATIONS: bending, squatting, stairs, transfers, and locomotion level  PARTICIPATION LIMITATIONS: community activity  PERSONAL FACTORS: Age, Past/current experiences, Time since onset of injury/illness/exacerbation, and 3+ comorbidities: CAD with MI 2002 s/p cardiac stenting, HTN, CKD-III, DJD, remote h/o cervical and lumbar fusions, GERD, multiple squamous cell cancer are also affecting patient's functional outcome.   REHAB POTENTIAL: Excellent  CLINICAL DECISION MAKING: Stable/uncomplicated  EVALUATION COMPLEXITY: Low   GOALS: Goals reviewed with patient? Yes  SHORT TERM  GOALS: Target date: 09/03/2023  Patient will be independent with initial HEP. Baseline:  Goal status: MET - 08/27/23  LONG TERM GOALS: Target date: 10/01/2023   Patient will be independent with advanced/ongoing HEP to improve outcomes and carryover.  Baseline:  Goal status: MET - 09/17/23   2.  Patient will report at least 50-75% improvement in R hip pain to improve QOL. Baseline: 2/10 Goal status: MET - 09/17/23 - Pt reports 90-95% improvement in pain  3.  Patient will demonstrate improved B LE strength to >/= 4+/5 for improved stability and ease of mobility. Baseline: Refer to above LE MMT table Goal status: PARTIALLY MET - 09/17/23 - met except B hip ABD & R hip ER 4/5  4.  Patient will be able to ambulate >1 mile on track at gym without increased pain or need for lidocaine  patch to allow for regular exercise.  Baseline: Currently must wear lidocaine  patch in order to complete workout at the [facility] including walking 1 mile on track Goal status: MET - 09/17/23 - Patient reports he is walking ~1.0 miles w/o lidocaine  patches  5.  Patient will report >/= 71/80 on LEFS to demonstrate improved functional ability. Baseline: 62 / 80 = 77.5 % Goal status: PARTIALLY MET - 09/17/23 - 65 / 80 = 81.3 % (limitation more related to knees than hip)   PLAN:  PT FREQUENCY: 1-2x/week  PT DURATION: 8 weeks  PLANNED INTERVENTIONS: 97164- PT Re-evaluation, 97110-Therapeutic exercises, 97530- Therapeutic activity, 97112- Neuromuscular re-education, 97535- Self Care, 54098- Manual therapy, [id number]- Gait training, 97014- Electrical stimulation (unattended), [phone]- Electrical stimulation (manual), 97035- Ultrasound, 78295- Ionotophoresis 4mg /ml Dexamethasone, Patient/Family education, Balance training, Stair training, Taping, Dry Needling, Joint mobilization, Cryotherapy, and Moist heat  PLAN FOR NEXT SESSION:  transition to HEP + 30-day hold   Phi Avans M Jayni Prescher, PT 09/17/2023, 10:13 AM   PHYSICAL THERAPY  DISCHARGE SUMMARY  Visits from Start of Care: 6  Current functional level related to goals / functional outcomes: Refer to above clinical impression and goal assessment for status as of last visit on 09/17/2023. Patient was placed on hold for 30 days and has not needed  to return to PT, therefore will proceed with discharge from PT for this episode.     Remaining deficits: As above.    Education / Equipment: HEP   Patient agrees to discharge. Patient goals were met or partially met. Patient is being discharged due to being pleased with the current functional level.  Francisco Irving, PT 12/31/2023, 10:36 AM  [facility] [address]  [address] [place], [place], [address] Phone: [phone]   Fax:  [phone]

## 2023-09-24 ENCOUNTER — Encounter: Payer: PPO | Admitting: Physical Therapy

## 2023-10-01 ENCOUNTER — Encounter: Payer: PPO | Admitting: Physical Therapy

## 2023-10-05 ENCOUNTER — Ambulatory Visit (HOSPITAL_COMMUNITY)
Admission: RE | Admit: 2023-10-05 | Discharge: 2023-10-05 | Disposition: A | Discharge status: Home / Self Care | Payer: PPO | Source: Ambulatory Visit | Attending: Cardiology | Admitting: Cardiology

## 2023-10-05 DIAGNOSIS — I351 Nonrheumatic aortic (valve) insufficiency: Secondary | ICD-10-CM | POA: Diagnosis not present

## 2023-10-05 LAB — ECHOCARDIOGRAM COMPLETE
AR max vel: 2.29 cm2
AV Area VTI: 2.18 cm2
AV Area mean vel: 2.16 cm2
AV Mean grad: 4 mm[Hg]
AV Peak grad: 6.8 mm[Hg]
AV Vena cont: 0.14 cm
Ao pk vel: 1.3 m/s
Area-P 1/2: 4.17 cm2
MV M vel: 3.04 m/s
MV Peak grad: 37 mm[Hg]
S' Lateral: 2.97 cm

## 2023-11-04 DIAGNOSIS — R351 Nocturia: Secondary | ICD-10-CM | POA: Diagnosis not present

## 2023-11-04 DIAGNOSIS — N5201 Erectile dysfunction due to arterial insufficiency: Secondary | ICD-10-CM | POA: Diagnosis not present

## 2023-11-04 DIAGNOSIS — N401 Enlarged prostate with lower urinary tract symptoms: Secondary | ICD-10-CM | POA: Diagnosis not present

## 2023-11-06 ENCOUNTER — Other Ambulatory Visit: Payer: Self-pay | Admitting: Cardiology

## 2023-11-10 DIAGNOSIS — D1801 Hemangioma of skin and subcutaneous tissue: Secondary | ICD-10-CM | POA: Diagnosis not present

## 2023-11-10 DIAGNOSIS — Z129 Encounter for screening for malignant neoplasm, site unspecified: Secondary | ICD-10-CM | POA: Diagnosis not present

## 2023-11-10 DIAGNOSIS — L821 Other seborrheic keratosis: Secondary | ICD-10-CM | POA: Diagnosis not present

## 2023-11-10 DIAGNOSIS — L570 Actinic keratosis: Secondary | ICD-10-CM | POA: Diagnosis not present

## 2023-11-30 ENCOUNTER — Other Ambulatory Visit: Payer: Self-pay | Admitting: Cardiology

## 2023-11-30 DIAGNOSIS — I10 Essential (primary) hypertension: Secondary | ICD-10-CM

## 2024-01-14 ENCOUNTER — Ambulatory Visit: Payer: PPO | Admitting: Family Medicine

## 2024-01-25 ENCOUNTER — Encounter: Payer: Self-pay | Admitting: Family Medicine

## 2024-01-25 ENCOUNTER — Ambulatory Visit: Payer: Self-pay | Admitting: Family Medicine

## 2024-01-25 ENCOUNTER — Ambulatory Visit (INDEPENDENT_AMBULATORY_CARE_PROVIDER_SITE_OTHER): Admitting: Family Medicine

## 2024-01-25 VITALS — BP 120/62 | HR 53 | Temp 97.7°F | Ht 65.0 in | Wt 171.8 lb

## 2024-01-25 DIAGNOSIS — R7309 Other abnormal glucose: Secondary | ICD-10-CM | POA: Diagnosis not present

## 2024-01-25 DIAGNOSIS — I10 Essential (primary) hypertension: Secondary | ICD-10-CM

## 2024-01-25 DIAGNOSIS — D539 Nutritional anemia, unspecified: Secondary | ICD-10-CM | POA: Diagnosis not present

## 2024-01-25 DIAGNOSIS — E7800 Pure hypercholesterolemia, unspecified: Secondary | ICD-10-CM | POA: Diagnosis not present

## 2024-01-25 DIAGNOSIS — D508 Other iron deficiency anemias: Secondary | ICD-10-CM

## 2024-01-25 DIAGNOSIS — Z862 Personal history of diseases of the blood and blood-forming organs and certain disorders involving the immune mechanism: Secondary | ICD-10-CM | POA: Insufficient documentation

## 2024-01-25 LAB — CBC
HCT: 40.3 % (ref 39.0–52.0)
Hemoglobin: 13.3 g/dL (ref 13.0–17.0)
MCHC: 33 g/dL (ref 30.0–36.0)
MCV: 94.2 fl (ref 78.0–100.0)
Platelets: 223 10*3/uL (ref 150.0–400.0)
RBC: 4.28 Mil/uL (ref 4.22–5.81)
RDW: 14.4 % (ref 11.5–15.5)
WBC: 6.9 10*3/uL (ref 4.0–10.5)

## 2024-01-25 LAB — URINALYSIS, ROUTINE W REFLEX MICROSCOPIC
Bilirubin Urine: NEGATIVE
Hgb urine dipstick: NEGATIVE
Ketones, ur: NEGATIVE
Leukocytes,Ua: NEGATIVE
Nitrite: NEGATIVE
RBC / HPF: NONE SEEN (ref 0–?)
Specific Gravity, Urine: 1.02 (ref 1.000–1.030)
Total Protein, Urine: NEGATIVE
Urine Glucose: NEGATIVE
Urobilinogen, UA: 0.2 (ref 0.0–1.0)
pH: 6 (ref 5.0–8.0)

## 2024-01-25 LAB — LIPID PANEL
Cholesterol: 103 mg/dL (ref 0–200)
HDL: 46.3 mg/dL (ref >39.00)
LDL Cholesterol: 39 mg/dL (ref 0–99)
NonHDL: 56.76
Total CHOL/HDL Ratio: 2
Triglycerides: 87 mg/dL (ref 0.0–149.0)
VLDL: 17.4 mg/dL (ref 0.0–40.0)

## 2024-01-25 LAB — BASIC METABOLIC PANEL WITH GFR
BUN: 18 mg/dL (ref 6–23)
CO2: 28 meq/L (ref 19–32)
Calcium: 9.9 mg/dL (ref 8.4–10.5)
Chloride: 104 meq/L (ref 96–112)
Creatinine, Ser: 1.17 mg/dL (ref 0.40–1.50)
GFR: 56.9 mL/min — ABNORMAL LOW (ref >60.00)
Glucose, Bld: 94 mg/dL (ref 70–99)
Potassium: 4.8 meq/L (ref 3.5–5.1)
Sodium: 141 meq/L (ref 135–145)

## 2024-01-25 LAB — VITAMIN B12: Vitamin B-12: 418 pg/mL (ref 211–911)

## 2024-01-25 LAB — HEMOGLOBIN A1C: Hgb A1c MFr Bld: 6.2 % (ref 4.6–6.5)

## 2024-01-25 NOTE — Progress Notes (Addendum)
 Established Patient Office Visit   Subjective:  Patient ID: [name], male    DOB: May 13, 1941  Age: 83 y.o. MRN: [id number]  Chief Complaint  Patient presents with   Medical Management of Chronic Issues    6 month follow up. Pt is fasting. Pt states he feels great no concerns.     HPI Encounter Diagnoses  Name Primary?   Essential hypertension Yes   Elevated cholesterol    Elevated glucose    Macrocytic anemia    Iron  deficiency anemia secondary to inadequate dietary iron  intake    Follow-up of above.  Blood pressure controlled on amlodipine  and hydralazine , Imdur  atenolol  and lisinopril .  Continues atorvastatin  at high dose for evaded cholesterol with history of CAD.  Continues iron  sulfate 4 times daily without issue.  Exercising regularly biking to the gym several days weekly.   Review of Systems  Constitutional: Negative.   HENT: Negative.    Eyes:  Negative for blurred vision, discharge and redness.  Respiratory: Negative.    Cardiovascular: Negative.   Gastrointestinal:  Negative for abdominal pain.  Genitourinary: Negative.   Musculoskeletal: Negative.  Negative for myalgias.  Skin:  Negative for rash.  Neurological:  Negative for tingling, loss of consciousness and weakness.  Endo/Heme/Allergies:  Negative for polydipsia.     Current Outpatient Medications:    amLODipine  (NORVASC ) 10 MG tablet, TAKE 1 TABLET BY MOUTH EVERYDAY AT BEDTIME, Disp: 30 tablet, Rfl: 9   aspirin  EC 81 MG tablet, Take 81 mg by mouth daily. Swallow whole., Disp: , Rfl:    atenolol  (TENORMIN ) 50 MG tablet, TAKE 1 TABLET BY MOUTH TWICE A DAY, Disp: 180 tablet, Rfl: 3   atorvastatin  (LIPITOR) 80 MG tablet, Take 1 tablet (80 mg total) by mouth at bedtime., Disp: 90 tablet, Rfl: 3   Coenzyme Q10 (CO Q 10 PO), Take 1 tablet by mouth daily., Disp: , Rfl:    hydrALAZINE  (APRESOLINE ) 25 MG tablet, Take 1 tablet (25 mg total) by mouth 3 (three) times daily., Disp: 180 tablet, Rfl: 0   Iron ,  Ferrous Sulfate , 325 (65 Fe) MG TABS, Take 325 mg by mouth every other day., Disp: 90 tablet, Rfl: 1   isosorbide  mononitrate (IMDUR ) 60 MG 24 hr tablet, Take 1 tablet (60 mg total) by mouth daily., Disp: 90 tablet, Rfl: 3   lisinopril  (ZESTRIL ) 10 MG tablet, Take 1 tablet (10 mg total) by mouth at bedtime., Disp: 90 tablet, Rfl: 3   Multiple Vitamins-Minerals (PRESERVISION AREDS 2) CAPS, Take 1 capsule by mouth 2 (two) times daily., Disp: , Rfl:    nitroGLYCERIN  (NITROSTAT ) 0.4 MG SL tablet, DISSOLVE 1 TABLET UNDER THE TONGUE EVERY 5 MINUTES AS NEEDED FOR CHEST PAIN. DO NOT EXCEED A TOTAL OF 3 DOSES IN 15 MINUTES., Disp: 25 tablet, Rfl: 2   pantoprazole  (PROTONIX ) 40 MG tablet, Take 1 tablet (40 mg total) by mouth every morning., Disp: 90 tablet, Rfl: 3   Polyethyl Glycol-Propyl Glycol (SYSTANE) 0.4-0.3 % SOLN, Place 2 drops into both eyes every morning., Disp: , Rfl:    primidone  (MYSOLINE ) 50 MG tablet, TAKE ONE TABLET BY MOUTH AT BREAKFAST AND AT BEDTIME, Disp: 180 tablet, Rfl: 2   Objective:     BP 120/62 (Cuff Size: Normal)   Pulse (!) 53   Temp 97.7 F (36.5 C) (Temporal)   Ht 5\' 5"  (1.651 m)   Wt 171 lb 12.8 oz (77.9 kg)   SpO2 96%   BMI 28.59 kg/m  Wt Readings  from Last 3 Encounters:  01/25/24 171 lb 12.8 oz (77.9 kg)  09/03/23 167 lb (75.8 kg)  07/20/23 169 lb (76.7 kg)      Physical Exam Constitutional:      General: He is not in acute distress.    Appearance: Normal appearance. He is not ill-appearing, toxic-appearing or diaphoretic.  HENT:     Head: Normocephalic and atraumatic.     Right Ear: External ear normal.     Left Ear: External ear normal.     Mouth/Throat:     Mouth: Mucous membranes are moist.     Pharynx: Oropharynx is clear. No oropharyngeal exudate or posterior oropharyngeal erythema.  Eyes:     General: No scleral icterus.       Right eye: No discharge.        Left eye: No discharge.     Extraocular Movements: Extraocular movements intact.      Conjunctiva/sclera: Conjunctivae normal.     Pupils: Pupils are equal, round, and reactive to light.  Cardiovascular:     Rate and Rhythm: Normal rate and regular rhythm.  Pulmonary:     Effort: Pulmonary effort is normal. No respiratory distress.     Breath sounds: Normal breath sounds.  Abdominal:     General: Bowel sounds are normal.  Musculoskeletal:     Cervical back: No rigidity or tenderness.  Skin:    General: Skin is warm and dry.  Neurological:     Mental Status: He is alert and oriented to person, place, and time.  Psychiatric:        Mood and Affect: Mood normal.        Behavior: Behavior normal.      No results found for any visits on 01/25/24.    The ASCVD Risk score (Arnett DK, et al., 2019) failed to calculate for the following reasons:   The 2019 ASCVD risk score is only valid for ages 45 to 55    Assessment & Plan:   Essential hypertension -     Basic metabolic panel with GFR -     Urinalysis, Routine w reflex microscopic  Elevated cholesterol -     Lipid panel  Elevated glucose -     Basic metabolic panel with GFR -     Hemoglobin A1c  Macrocytic anemia -     CBC -     Vitamin B12  Iron  deficiency anemia secondary to inadequate dietary iron  intake -     Iron , TIBC and Ferritin Panel    Return in about 6 months (around 07/26/2024).    Tonna Frederic, MD  6/3 addendum: Asked patient to take his iron  tablets daily.

## 2024-01-26 LAB — IRON,TIBC AND FERRITIN PANEL
%SAT: 39 % (ref 20–48)
Ferritin: 12 ng/mL — ABNORMAL LOW (ref 24–380)
Iron: 129 ug/dL (ref 50–180)
TIBC: 334 ug/dL (ref 250–425)

## 2024-01-27 ENCOUNTER — Telehealth: Payer: Self-pay

## 2024-01-27 NOTE — Telephone Encounter (Signed)
 Copied from CRM [id number]. Topic: General - Other >> Jan 27, 2024 12:33 PM [name] wrote: Reason for CRM: Patient called stating he had a missed call with no documentation or messages left on chart. Patient is requesting a follow up call and can be reached at [phone].

## 2024-01-27 NOTE — Telephone Encounter (Signed)
 Spoke to patient to follow up if he have any questions or concerns. Pt stated he is doing well and will maintain appointment in December with Dr. Tilmon Font. I did advise patient, if does have any questions or concerns to please call the clinic or send a MyChart message. Pt verbalized understanding and a thank you for the call.

## 2024-02-04 ENCOUNTER — Other Ambulatory Visit: Payer: Self-pay | Admitting: Cardiology

## 2024-02-08 ENCOUNTER — Other Ambulatory Visit: Payer: Self-pay | Admitting: Cardiology

## 2024-02-11 ENCOUNTER — Other Ambulatory Visit: Payer: Self-pay | Admitting: Cardiology

## 2024-03-24 DIAGNOSIS — D0439 Carcinoma in situ of skin of other parts of face: Secondary | ICD-10-CM | POA: Diagnosis not present

## 2024-03-24 DIAGNOSIS — D485 Neoplasm of uncertain behavior of skin: Secondary | ICD-10-CM | POA: Diagnosis not present

## 2024-03-24 DIAGNOSIS — L570 Actinic keratosis: Secondary | ICD-10-CM | POA: Diagnosis not present

## 2024-05-03 ENCOUNTER — Other Ambulatory Visit: Payer: Self-pay | Admitting: Cardiology

## 2024-05-10 ENCOUNTER — Other Ambulatory Visit: Payer: Self-pay | Admitting: Cardiology

## 2024-05-24 ENCOUNTER — Other Ambulatory Visit: Payer: Self-pay | Admitting: Family Medicine

## 2024-05-24 DIAGNOSIS — G252 Other specified forms of tremor: Secondary | ICD-10-CM

## 2024-06-27 ENCOUNTER — Ambulatory Visit (INDEPENDENT_AMBULATORY_CARE_PROVIDER_SITE_OTHER)

## 2024-06-27 VITALS — BP 104/60 | HR 48 | Temp 98.2°F | Ht 64.5 in | Wt 171.4 lb

## 2024-06-27 DIAGNOSIS — Z0000 Encounter for general adult medical examination without abnormal findings: Secondary | ICD-10-CM | POA: Diagnosis not present

## 2024-06-27 NOTE — Progress Notes (Signed)
 Subjective:   [name] is a 82 y.o. who presents for a Medicare Wellness preventive visit.  As a reminder, Annual Wellness Visits don't include a physical exam, and some assessments may be limited, especially if this visit is performed virtually. We may recommend an in-person follow-up visit with your provider if needed.  Visit Complete: In person    Persons Participating in Visit: Patient.  AWV Questionnaire: Yes: Patient Medicare AWV questionnaire was completed by the patient on 06/24/2024; I have confirmed that all information answered by patient is correct and no changes since this date.        Objective:    Today's Vitals   06/27/24 0840  BP: 104/60  Pulse: (!) 48  Temp: 98.2 F (36.8 C)  TempSrc: Oral  SpO2: 94%  Weight: 171 lb 6.4 oz (77.7 kg)  Height: 5' 4.5 (1.638 m)   Body mass index is 28.97 kg/m.     06/27/2024    9:12 AM 06/27/2024    8:45 AM 08/06/2023    9:32 AM 09/08/2022   11:11 PM 09/03/2022   10:00 PM 09/03/2022   11:39 AM 09/01/2022    6:02 PM  Advanced Directives  Does Patient Have a Medical Advance Directive? Yes Yes Yes No Yes Yes No  Type of Estate Agent of [place];Living will Healthcare Power of [place];Living will Healthcare Power of [place];Living will  Healthcare Power of [place] of [place];Living will   Does patient want to make changes to medical advance directive?  No - Patient declined No - Patient declined No - Patient declined No - Guardian declined No - Patient declined   Copy of Healthcare Power of Attorney in Chart? No - copy requested No - copy requested   No - copy requested No - copy requested     Current Medications (verified) Outpatient Encounter Medications as of 06/27/2024  Medication Sig   amLODipine  (NORVASC ) 10 MG tablet TAKE 1 TABLET BY MOUTH EVERYDAY AT BEDTIME   aspirin  EC 81 MG tablet Take 81 mg by mouth daily. Swallow whole.   atenolol  (TENORMIN ) 50 MG tablet TAKE  1 TABLET BY MOUTH TWICE A DAY   atorvastatin  (LIPITOR) 80 MG tablet TAKE 1 TABLET BY MOUTH AT BEDTIME   Coenzyme Q10 (CO Q 10 PO) Take 1 tablet by mouth daily.   hydrALAZINE  (APRESOLINE ) 25 MG tablet TAKE 1 TABLET BY MOUTH THREE TIMES A DAY   Iron , Ferrous Sulfate , 325 (65 Fe) MG TABS Take 325 mg by mouth every other day.   isosorbide  mononitrate (IMDUR ) 60 MG 24 hr tablet TAKE 1 TABLET BY MOUTH EVERY DAY   lisinopril  (ZESTRIL ) 10 MG tablet TAKE 1 TABLET BY MOUTH EVERYDAY AT BEDTIME   Multiple Vitamins-Minerals (PRESERVISION AREDS 2) CAPS Take 1 capsule by mouth 2 (two) times daily.   nitroGLYCERIN  (NITROSTAT ) 0.4 MG SL tablet DISSOLVE 1 TABLET UNDER THE TONGUE EVERY 5 MINUTES AS NEEDED FOR CHEST PAIN. DO NOT EXCEED A TOTAL OF 3 DOSES IN 15 MINUTES.   pantoprazole  (PROTONIX ) 40 MG tablet Take 1 tablet (40 mg total) by mouth every morning.   Polyethyl Glycol-Propyl Glycol (SYSTANE) 0.4-0.3 % SOLN Place 2 drops into both eyes every morning.   primidone  (MYSOLINE ) 50 MG tablet TAKE ONE TABLET BY MOUTH AT BREAKFAST AND AT BEDTIME   No facility-administered encounter medications on file as of 06/27/2024.    Allergies (verified) Patient has no active allergies.   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 - decreased dose of HCTZ to M,F   Diverticulosis    DJD (degenerative joint disease)    Duodenal ulcer    ED (erectile dysfunction)    GERD (gastroesophageal reflux disease)    increased s/s in the last week 03-22-19 PV    Hemorrhoids    Hx of adenomatous colonic polyps    Hyperlipidemia    Hypertension    Myocardial infarction (HCC)    2002   SCC (squamous cell carcinoma)  07/07/2018   mid frontal scalp-[id number]   SCC (squamous cell carcinoma) 06/15/2019   front middle scalp-tx cx31fu   SCC (squamous cell carcinoma) 12/27/2002   CIS-right angle of jaw--cx49fu   SCC (squamous cell carcinoma) 09/24/2004   CIS--mid post crown, left mis crown medial,left first web space-tx cx40fu   SCC (squamous cell carcinoma) 06/03/2006   SCCA-right temple-tx cx74fu   SCC (squamous cell carcinoma) 06/03/2006   CIS-right forehead, left mis crown scalp medial-tx cx29fu   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/30/2006   right temple lateral-txpbx   SCC (squamous cell carcinoma) 11/10/2006   left forehead, left hand proximal   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/02/2009   left scalp-cx29fu   SCC (squamous cell carcinoma) 01/24/2015   left scalp-cx17fu   SCC (squamous cell carcinoma) 11/12/2015   front lateralscalp-cx80fu, medial scalp-cx36fu   SCC (squamous cell carcinoma) 12/17/2016   front center scalp-cx53fu   Squamous cell carcinoma of skin 08/10/2017   CIS--right ear rim, top of scalp, left side scalp-tx-cx17fu   Past Surgical History:  Procedure Laterality Date   CARDIAC CATHETERIZATION     COLONOSCOPY     CORONARY STENT INTERVENTION N/A 09/03/2022   Procedure: CORONARY STENT INTERVENTION;  Surgeon: Anner Alm ORN, MD;  Location: [facility] INVASIVE CV LAB;  Service: Cardiovascular;  Laterality: N/A;   ELBOW ARTHROSCOPY     heart stents     2002,2004   LEFT HEART CATH AND CORONARY ANGIOGRAPHY N/A 09/03/2022   Procedure: LEFT HEART CATH AND CORONARY ANGIOGRAPHY;  Surgeon: Anner Alm ORN, MD;  Location: [facility] INVASIVE CV LAB;  Service: Cardiovascular;  Laterality: N/A;   SPINAL FUSION     SPINAL FUSION     SPINE SURGERY     cervical   UPPER GASTROINTESTINAL ENDOSCOPY     Family History  Problem Relation Age of Onset   Alcohol  abuse Other    Coronary artery disease Other    Alzheimer's disease Other    Dementia Mother    Colon cancer Neg Hx    Esophageal cancer Neg Hx     Rectal cancer Neg Hx    Stomach cancer Neg Hx    Colon polyps Neg Hx    Social History   Socioeconomic History   Marital status: Married    Spouse name: Not on file   Number of children: 1   Years of education: 17   Highest education level: Master's degree (e.g., MA, MS, MEng, MEd, MSW, MBA)  Occupational History   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   Occupation: retired  Tobacco Use   Smoking status: Former  Current packs/day: 0.00    Types: Cigarettes    Quit date: 08/25/1961    Years since quitting: 62.8    Passive exposure: Never   Smokeless tobacco: Never  Vaping Use   Vaping status: Never Used  Substance and Sexual Activity   Alcohol  use: Not Currently    Comment: very seldom   Drug use: No   Sexual activity: Not Currently  Other Topics Concern   Not on file  Social History Narrative   Right handed   1 Story home    live spouse   Social Drivers of Corporate Investment Banker Strain: Low Risk  (06/24/2024)   Overall Financial Resource Strain (CARDIA)    Difficulty of Paying Living Expenses: Not hard at all  Food Insecurity: No Food Insecurity (06/24/2024)   Hunger Vital Sign    Worried About Running Out of Food in the Last Year: Never true    Ran Out of Food in the Last Year: Never true  Transportation Needs: No Transportation Needs (06/24/2024)   PRAPARE - Administrator, Civil Service (Medical): No    Lack of Transportation (Non-Medical): No  Physical Activity: Insufficiently Active (06/24/2024)   Exercise Vital Sign    Days of Exercise per Week: 2 days    Minutes of Exercise per Session: 60 min  Stress: No Stress Concern Present (06/24/2024)   [employer] of Occupational Health - Occupational Stress Questionnaire    Feeling of Stress: Not at all  Social Connections: Socially Integrated (06/24/2024)   Social Connection and Isolation Panel    Frequency of Communication with Friends and Family: More than three times a week     Frequency of Social Gatherings with Friends and Family: Once a week    Attends Religious Services: More than 4 times per year    Active Member of [employer] or Organizations: Yes    Attends Engineer, Structural: More than 4 times per year    Marital Status: Married    Tobacco Counseling Counseling given: Not Answered    Clinical Intake:              Lab Results  Component Value Date   HGBA1C 6.2 01/25/2024   HGBA1C 5.9 07/16/2023   HGBA1C 5.8 04/14/2023        Interpreter Needed?: No      Activities of Daily Living     06/24/2024    6:39 PM  In your present state of health, do you have any difficulty performing the following activities:  Hearing? 0  Vision? 0  Difficulty concentrating or making decisions? 0  Walking or climbing stairs? 0  Dressing or bathing? 0  Doing errands, shopping? 0  Preparing Food and eating ? N  Using the Toilet? N  In the past six months, have you accidently leaked urine? N  Do you have problems with loss of bowel control? N  Managing your Medications? N  Managing your Finances? N  Housekeeping or managing your Housekeeping? N    Patient Care Team: Berneta Elsie Sayre, MD as PCP - General (Family Medicine) Lavona Agent, MD as PCP - Cardiology (Cardiology) Livingston Rigg, MD as Consulting Physician (Dermatology) Tat, Asberry RAMAN, DO as Consulting Physician (Neurology) Fate Morna SAILOR, [facility] (Inactive) as Pharmacist (Pharmacist) Duke, Jon Garre, PA as Physician Assistant (Cardiology) Francella Fairy SAILOR, OD as Consulting Physician (Optometry)  I have updated your Care Teams any recent Medical Services you may have received from other providers in the past  year.     Assessment:   This is a routine wellness examination for Shin.  Hearing/Vision screen Hearing Screening - Comments:: Denies hearing issues Vision Screening - Comments:: Regular eye exams, Dr. Francella   Goals Addressed             This  Visit's Progress    Patient Stated       06/27/2024, keep exercising, stay active       Depression Screen     06/27/2024    9:08 AM 01/05/2023    3:20 PM 12/24/2022    1:52 PM 10/16/2022    1:20 PM 10/16/2022   11:22 AM 03/21/2022    9:53 AM 03/20/2021    2:09 PM  PHQ 2/9 Scores  PHQ - 2 Score 0 0 0 0 0 0 0  PHQ- 9 Score 0 0 0 0 0      Fall Risk     06/27/2024    9:12 AM 06/27/2024    8:45 AM 06/24/2024    6:39 PM 04/14/2023    1:22 PM 01/05/2023    3:23 PM  Fall Risk   Falls in the past year? 0 0 0 0 0  Number falls in past yr: 0 0 0 0 0  Injury with Fall? 0 0 0 0 0  Risk for fall due to : Medication side effect Medication side effect  No Fall Risks No Fall Risks  Follow up Falls prevention discussed;Falls evaluation completed Falls prevention discussed;Falls evaluation completed  Falls evaluation completed Falls prevention discussed    MEDICARE RISK AT HOME:  Medicare Risk at Home Any stairs in or around the home?: (Patient-Rptd) No Home free of loose throw rugs in walkways, pet beds, electrical cords, etc?: (Patient-Rptd) No Adequate lighting in your home to reduce risk of falls?: (Patient-Rptd) Yes Life alert?: (Patient-Rptd) No Use of a cane, walker or w/c?: (Patient-Rptd) No Grab bars in the bathroom?: (Patient-Rptd) No Shower chair or bench in shower?: (Patient-Rptd) No Elevated toilet seat or a handicapped toilet?: (Patient-Rptd) No  TIMED UP AND GO:  Was the test performed?  Yes  Length of time to ambulate 10 feet: 5 sec Gait steady and fast without use of assistive device  Cognitive Function: 6CIT completed        06/27/2024    9:12 AM 06/27/2024    8:45 AM 01/05/2023    3:24 PM 03/21/2022   10:05 AM  6CIT Screen  What Year? 0 points 0 points 0 points 0 points  What month? 0 points 0 points 0 points 0 points  What time? 0 points 0 points 0 points 0 points  Count back from 20 0 points 0 points 0 points 0 points  Months in reverse 0 points 0 points 0 points  0 points  Repeat phrase 0 points 0 points 0 points 0 points  Total Score 0 points 0 points 0 points 0 points    Immunizations Immunization History  Administered Date(s) Administered   Covid-19 Iv Non-us  Vaccine (Bibp, Sinopharm) 04/28/2023   Fluad Quad(high Dose 65+) 05/07/2019, 06/14/2020   H1N1 09/22/2008   INFLUENZA, HIGH DOSE SEASONAL PF 06/21/2015, 05/08/2016, 06/27/2017, 06/14/2018, 05/16/2022   Influenza Split 05/18/2012   Influenza Whole 08/25/2005, 06/08/2008, 07/04/2009, 06/24/2010   Influenza,inj,Quad PF,6+ Mos 07/20/2013, 06/23/2014   Influenza-Unspecified 05/21/2023   PFIZER(Purple Top)SARS-COV-2 Vaccination 09/05/2019, 09/25/2019, 07/03/2020   Pfizer Covid-19 Vaccine Bivalent Booster 45yrs & up 05/09/2021   Pfizer(Comirnaty)Fall Seasonal Vaccine 12 years and older 05/16/2022   Pneumococcal Conjugate-13  01/18/2014   Pneumococcal Polysaccharide-23 08/25/2002, 09/21/2007, 02/06/2016, 12/26/2021   Respiratory Syncytial Virus Vaccine,Recomb Aduvanted(Arexvy) 10/09/2022   Td 08/25/2006   Tdap 09/22/2016   Zoster Recombinant(Shingrix ) 09/19/2021, 03/16/2022   Zoster, Live 10/20/2008    Screening Tests Health Maintenance  Topic Date Due   COVID-19 Vaccine (6 - 2025-26 season) 04/25/2024   Medicare Annual Wellness (AWV)  06/27/2025   DTaP/Tdap/Td (3 - Td or Tdap) 09/22/2026   Pneumococcal Vaccine: 50+ Years  Completed   Influenza Vaccine  Completed   Zoster Vaccines- Shingrix   Completed   Meningococcal B Vaccine  Aged Out    Health Maintenance Items Addressed: Up to date  Additional Screening:  Vision Screening: Recommended annual ophthalmology exams for early detection of glaucoma and other disorders of the eye. Is the patient up to date with their annual eye exam?  Yes  Who is the provider or what is the name of the office in which the patient attends annual eye exams? Dr. Francella  Dental Screening: Recommended annual dental exams for proper oral  hygiene  Community Resource Referral / Chronic Care Management: CRR required this visit?  No   CCM required this visit?  No   Plan:    I have personally reviewed and noted the following in the patient's chart:   Medical and social history Use of alcohol , tobacco or illicit drugs  Current medications and supplements including opioid prescriptions. Patient is not currently taking opioid prescriptions. Functional ability and status Nutritional status Physical activity Advanced directives List of other physicians Hospitalizations, surgeries, and ER visits in previous 12 months Vitals Screenings to include cognitive, depression, and falls Referrals and appointments  In addition, I have reviewed and discussed with patient certain preventive protocols, quality metrics, and best practice recommendations. A written personalized care plan for preventive services as well as general preventive health recommendations were provided to patient.   Ardella FORBES Dawn, LPN   [id number]/[id number]   After Visit Summary: (In Person-Printed) AVS printed and given to the patient  Notes: Nothing significant to report at this time.

## 2024-06-27 NOTE — Patient Instructions (Addendum)
 [name],  Thank you for taking the time for your Medicare Wellness Visit. I appreciate your continued commitment to your health goals. Please review the care plan we discussed, and feel free to reach out if I can assist you further.  Please note that Annual Wellness Visits do not include a physical exam. Some assessments may be limited, especially if the visit was conducted virtually. If needed, we may recommend an in-person follow-up with your provider.  Ongoing Care Seeing your primary care provider every 3 to 6 months helps us  monitor your health and provide consistent, personalized care.   Referrals If a referral was made during today's visit and you haven't received any updates within two weeks, please contact the referred provider directly to check on the status.  Recommended Screenings:  Health Maintenance  Topic Date Due   COVID-19 Vaccine (6 - 2025-26 season) 04/25/2024   Medicare Annual Wellness Visit  06/27/2025   DTaP/Tdap/Td vaccine (3 - Td or Tdap) 09/22/2026   Pneumococcal Vaccine for age over 65  Completed   Flu Shot  Completed   Zoster (Shingles) Vaccine  Completed   Meningitis B Vaccine  Aged Out       06/27/2024    8:45 AM  Advanced Directives  Does Patient Have a Medical Advance Directive? Yes  Type of Estate Agent of [place];Living will  Does patient want to make changes to medical advance directive? No - Patient declined  Copy of Healthcare Power of Attorney in Chart? No - copy requested    Vision: Annual vision screenings are recommended for early detection of glaucoma, cataracts, and diabetic retinopathy. These exams can also reveal signs of chronic conditions such as diabetes and high blood pressure.  Dental: Annual dental screenings help detect early signs of oral cancer, gum disease, and other conditions linked to overall health, including heart disease and diabetes.  Please see the attached documents for additional  preventive care recommendations.

## 2024-07-26 ENCOUNTER — Encounter: Payer: Self-pay | Admitting: Family Medicine

## 2024-07-26 ENCOUNTER — Ambulatory Visit (INDEPENDENT_AMBULATORY_CARE_PROVIDER_SITE_OTHER): Admitting: Family Medicine

## 2024-07-26 VITALS — BP 104/68 | HR 50 | Temp 97.8°F | Ht 65.0 in | Wt 170.4 lb

## 2024-07-26 DIAGNOSIS — I10 Essential (primary) hypertension: Secondary | ICD-10-CM | POA: Diagnosis not present

## 2024-07-26 DIAGNOSIS — D508 Other iron deficiency anemias: Secondary | ICD-10-CM

## 2024-07-26 DIAGNOSIS — D539 Nutritional anemia, unspecified: Secondary | ICD-10-CM

## 2024-07-26 DIAGNOSIS — R7303 Prediabetes: Secondary | ICD-10-CM

## 2024-07-26 DIAGNOSIS — G252 Other specified forms of tremor: Secondary | ICD-10-CM | POA: Diagnosis not present

## 2024-07-26 DIAGNOSIS — E7800 Pure hypercholesterolemia, unspecified: Secondary | ICD-10-CM

## 2024-07-26 LAB — BASIC METABOLIC PANEL WITH GFR
BUN: 18 mg/dL (ref 6–23)
CO2: 32 meq/L (ref 19–32)
Calcium: 10.1 mg/dL (ref 8.4–10.5)
Chloride: 101 meq/L (ref 96–112)
Creatinine, Ser: 1.17 mg/dL (ref 0.40–1.50)
GFR: 56.7 mL/min — ABNORMAL LOW (ref >60.00)
Glucose, Bld: 94 mg/dL (ref 70–99)
Potassium: 4.1 meq/L (ref 3.5–5.1)
Sodium: 139 meq/L (ref 135–145)

## 2024-07-26 LAB — CBC
HCT: 43.1 % (ref 39.0–52.0)
Hemoglobin: 14.4 g/dL (ref 13.0–17.0)
MCHC: 33.5 g/dL (ref 30.0–36.0)
MCV: 95.2 fl (ref 78.0–100.0)
Platelets: 241 K/uL (ref 150.0–400.0)
RBC: 4.53 Mil/uL (ref 4.22–5.81)
RDW: 13.5 % (ref 11.5–15.5)
WBC: 6.9 K/uL (ref 4.0–10.5)

## 2024-07-26 LAB — VITAMIN B12: Vitamin B-12: 406 pg/mL (ref 211–911)

## 2024-07-26 LAB — HEMOGLOBIN A1C: Hgb A1c MFr Bld: 5.7 % (ref 4.6–6.5)

## 2024-07-26 LAB — LDL CHOLESTEROL, DIRECT: Direct LDL: 47 mg/dL

## 2024-07-26 NOTE — Progress Notes (Signed)
 Established Patient Office Visit   Subjective:  Patient ID: [name], male    DOB: September 12, 1940  Age: 83 y.o. MRN: [id number]  Chief Complaint  Patient presents with   Follow-up    6 months no concerns    HPI Encounter Diagnoses  Name Primary?   Essential hypertension Yes   Coarse tremors    Elevated cholesterol    Macrocytic anemia    Iron  deficiency anemia secondary to inadequate dietary iron  intake    Prediabetes    For follow-up of above.  History of hypertension with coronary artery disease that is stable on current regimen.  Continues also follow-up with cardiology.  He is on a low-fat low-cholesterol diet.  He exercises daily by walking and/or going to the gym.  Continues taking iron  every other day.  Continues B12.  Prediabetes is diet controlled.  He lives at home with his wife.  His neurologist Dr. Evonnie has turned over the management of his essential tremor to me.  He is doing quite well with the primidone  as it is quite effective for him.   Review of Systems  Constitutional: Negative.   HENT: Negative.    Eyes:  Negative for blurred vision, discharge and redness.  Respiratory: Negative.    Cardiovascular: Negative.   Gastrointestinal:  Negative for abdominal pain.  Genitourinary: Negative.   Musculoskeletal: Negative.  Negative for myalgias.  Skin:  Negative for rash.  Neurological:  Negative for tingling, loss of consciousness and weakness.  Endo/Heme/Allergies:  Negative for polydipsia.     Current Outpatient Medications:    amLODipine  (NORVASC ) 10 MG tablet, TAKE 1 TABLET BY MOUTH EVERYDAY AT BEDTIME, Disp: 30 tablet, Rfl: 9   aspirin  EC 81 MG tablet, Take 81 mg by mouth daily. Swallow whole., Disp: , Rfl:    atenolol  (TENORMIN ) 50 MG tablet, TAKE 1 TABLET BY MOUTH TWICE A DAY, Disp: 180 tablet, Rfl: 3   atorvastatin  (LIPITOR) 80 MG tablet, TAKE 1 TABLET BY MOUTH AT BEDTIME, Disp: 90 tablet, Rfl: 3   Coenzyme Q10 (CO Q 10 PO), Take 1 tablet by mouth  daily., Disp: , Rfl:    hydrALAZINE  (APRESOLINE ) 25 MG tablet, TAKE 1 TABLET BY MOUTH THREE TIMES A DAY, Disp: 270 tablet, Rfl: 0   Iron , Ferrous Sulfate , 325 (65 Fe) MG TABS, Take 325 mg by mouth every other day., Disp: 90 tablet, Rfl: 1   isosorbide  mononitrate (IMDUR ) 60 MG 24 hr tablet, TAKE 1 TABLET BY MOUTH EVERY DAY, Disp: 90 tablet, Rfl: 2   lisinopril  (ZESTRIL ) 10 MG tablet, TAKE 1 TABLET BY MOUTH EVERYDAY AT BEDTIME, Disp: 90 tablet, Rfl: 1   Multiple Vitamins-Minerals (PRESERVISION AREDS 2) CAPS, Take 1 capsule by mouth 2 (two) times daily., Disp: , Rfl:    nitroGLYCERIN  (NITROSTAT ) 0.4 MG SL tablet, DISSOLVE 1 TABLET UNDER THE TONGUE EVERY 5 MINUTES AS NEEDED FOR CHEST PAIN. DO NOT EXCEED A TOTAL OF 3 DOSES IN 15 MINUTES., Disp: 25 tablet, Rfl: 2   pantoprazole  (PROTONIX ) 40 MG tablet, Take 1 tablet (40 mg total) by mouth every morning., Disp: 90 tablet, Rfl: 3   Polyethyl Glycol-Propyl Glycol (SYSTANE) 0.4-0.3 % SOLN, Place 2 drops into both eyes every morning., Disp: , Rfl:    primidone  (MYSOLINE ) 50 MG tablet, TAKE ONE TABLET BY MOUTH AT BREAKFAST AND AT BEDTIME, Disp: 180 tablet, Rfl: 2   Objective:     BP 104/68   Pulse (!) 50   Temp 97.8 F (36.6 C) (Temporal)  Ht 5' 5 (1.651 m)   Wt 170 lb 6.4 oz (77.3 kg)   SpO2 98%   BMI 28.36 kg/m  BP Readings from Last 3 Encounters:  07/26/24 104/68  06/27/24 104/60  01/25/24 120/62   Wt Readings from Last 3 Encounters:  07/26/24 170 lb 6.4 oz (77.3 kg)  06/27/24 171 lb 6.4 oz (77.7 kg)  01/25/24 171 lb 12.8 oz (77.9 kg)      Physical Exam Constitutional:      General: He is not in acute distress.    Appearance: Normal appearance. He is not ill-appearing, toxic-appearing or diaphoretic.  HENT:     Head: Normocephalic and atraumatic.     Right Ear: External ear normal.     Left Ear: External ear normal.  Eyes:     General: No scleral icterus.       Right eye: No discharge.        Left eye: No discharge.      Extraocular Movements: Extraocular movements intact.     Conjunctiva/sclera: Conjunctivae normal.  Cardiovascular:     Rate and Rhythm: Normal rate and regular rhythm.  Pulmonary:     Effort: Pulmonary effort is normal. No respiratory distress.     Breath sounds: No wheezing, rhonchi or rales.  Skin:    General: Skin is warm and dry.  Neurological:     Mental Status: He is alert and oriented to person, place, and time.  Psychiatric:        Mood and Affect: Mood normal.        Behavior: Behavior normal.      No results found for any visits on 07/26/24.    The ASCVD Risk score (Arnett DK, et al., 2019) failed to calculate for the following reasons:   The 2019 ASCVD risk score is only valid for ages 89 to 36   Risk score cannot be calculated because patient has a medical history suggesting prior/existing ASCVD    Assessment & Plan:   Essential hypertension -     CBC -     Basic metabolic panel with GFR  Coarse tremors  Elevated cholesterol -     LDL cholesterol, direct  Macrocytic anemia -     Vitamin B12 -     CBC  Iron  deficiency anemia secondary to inadequate dietary iron  intake -     CBC -     Iron , TIBC and Ferritin Panel  Prediabetes -     Basic metabolic panel with GFR -     Hemoglobin A1c    Return in about 6 months (around 01/24/2025).  Briefly discussed metformin.  Continue all medications as above.  Adjustments made pending results of labs  Elsie Sim Lent, MD

## 2024-07-28 ENCOUNTER — Ambulatory Visit: Payer: Self-pay | Admitting: Family Medicine

## 2024-07-28 LAB — IRON,TIBC AND FERRITIN PANEL
%SAT: 19 % — ABNORMAL LOW (ref 20–48)
Ferritin: 15 ng/mL — ABNORMAL LOW (ref 24–380)
Iron: 62 ug/dL (ref 50–180)
TIBC: 323 ug/dL (ref 250–425)

## 2024-07-30 ENCOUNTER — Other Ambulatory Visit: Payer: Self-pay | Admitting: Family Medicine

## 2024-07-30 DIAGNOSIS — K2270 Barrett's esophagus without dysplasia: Secondary | ICD-10-CM

## 2024-07-30 DIAGNOSIS — K219 Gastro-esophageal reflux disease without esophagitis: Secondary | ICD-10-CM

## 2024-08-02 NOTE — Telephone Encounter (Signed)
 Refill request received for pantoprazole  40mg  FOV:10/24/2024 LOV:07/26/2024 Last refill:09/10/2023 Medication is pending your approval.

## 2024-08-03 ENCOUNTER — Other Ambulatory Visit: Payer: Self-pay | Admitting: Medical Genetics

## 2024-08-08 ENCOUNTER — Other Ambulatory Visit: Payer: Self-pay | Admitting: Cardiology

## 2024-09-10 NOTE — Progress Notes (Unsigned)
" °  Cardiology Office Note:   Date:  09/12/2024  ID:  [name] Steven, DOB 1940/12/16, MRN [id number] PCP: Steven Elsie Sayre, MD  [facility] Providers Cardiologist:  Lynwood Schilling, MD Cardiology APP:  Madie Jon Garre, [place] {  History of Present Illness:   [name] is a 84 y.o. male who presents for follow up of his CAD.  He had BMS to RCA in 10/2000, DES to 1st Diag and PTCA to LCX in 06/2005, and most recently a complex bifurcation two vessel PCI with DES to LAD and DES to ostial 1st Diag in 08/2022.    In 2025 I saw him and ordered an echo in 2025 and he had no AI which had been noted previously.  The EF was normal.  He presents for follow up.     Since I last saw him he has had no new cardiovascular complaints.  He exercises at the [facility].  He will do sitting elliptical and treadmill as well as some weights.  He put away Christmas boxes.  He denies any cardiovascular symptoms. The patient denies any new symptoms such as chest discomfort, neck or arm discomfort. There has been no new shortness of breath, PND or orthopnea. There have been no reported palpitations, presyncope or syncope.   ROS: As stated in the HPI and negative for all other systems.  Studies Reviewed:    EKG:   EKG Interpretation Date/Time:  Monday September 12 2024 12:02:25 EST Ventricular Rate:  47 PR Interval:  222 QRS Duration:  86 QT Interval:  472 QTC Calculation: 417 R Axis:   -22  Text Interpretation: Sinus bradycardia with 1st degree A-V block Minimal voltage criteria for LVH, may be normal variant ( R in aVL ) Anteroseptal infarct , age undetermined When compared with ECG of 03-Sep-2023 10:32, No significant change since last tracing Confirmed by Schilling Rattan ([id number]) on 09/12/2024 12:25:55 PM    Risk Assessment/Calculations:              Physical Exam:   VS:  BP 138/76 (BP Location: Left Arm, Patient Position: Sitting)   Pulse (!) 47   Ht 5' 6 (1.676 m)   Wt 167 lb 6.4 oz (75.9 kg)    SpO2 98%   BMI 27.02 kg/m    Wt Readings from Last 3 Encounters:  09/12/24 167 lb 6.4 oz (75.9 kg)  07/26/24 170 lb 6.4 oz (77.3 kg)  06/27/24 171 lb 6.4 oz (77.7 kg)     GEN: Well nourished, well developed in no acute distress NECK: No JVD; No carotid bruits CARDIAC: RRR, no murmurs, rubs, gallops RESPIRATORY:  Clear to auscultation without rales, wheezing or rhonchi  ABDOMEN: Soft, non-tender, non-distended EXTREMITIES:  No edema; No deformity   ASSESSMENT AND PLAN:   CAD:  The patient has no new sypmtoms.  No further cardiovascular testing is indicated.  We will continue with aggressive risk reduction and meds as listed.  Hypertension:   The blood pressure is at target.  No change in therapy.  Hyperlipidemia:   LDL was 47 with an HDL 46.3.  Continue meds as listed.   50 with an HDL of 44.  No change in therapy.      Follow up with me in 1 year  Signed, Lynwood Schilling, MD   "

## 2024-09-12 ENCOUNTER — Encounter: Payer: Self-pay | Admitting: Cardiology

## 2024-09-12 ENCOUNTER — Ambulatory Visit: Attending: Cardiology | Admitting: Cardiology

## 2024-09-12 VITALS — BP 138/76 | HR 47 | Ht 66.0 in | Wt 167.4 lb

## 2024-09-12 DIAGNOSIS — I10 Essential (primary) hypertension: Secondary | ICD-10-CM | POA: Diagnosis not present

## 2024-09-12 DIAGNOSIS — I2510 Atherosclerotic heart disease of native coronary artery without angina pectoris: Secondary | ICD-10-CM

## 2024-09-12 DIAGNOSIS — E785 Hyperlipidemia, unspecified: Secondary | ICD-10-CM | POA: Diagnosis not present

## 2024-09-12 NOTE — Patient Instructions (Signed)

## 2024-10-04 ENCOUNTER — Other Ambulatory Visit (HOSPITAL_COMMUNITY)

## 2024-10-24 ENCOUNTER — Ambulatory Visit: Admitting: Family Medicine

## 2025-01-23 ENCOUNTER — Ambulatory Visit: Admitting: Family Medicine

## 2025-07-03 ENCOUNTER — Ambulatory Visit
